# Patient Record
Sex: Female | Born: 1960
Health system: Southern US, Community
[De-identification: ages and names within clinical notes are randomized; demographics above are authoritative.]

## PROBLEM LIST (undated history)

## (undated) DIAGNOSIS — R519 Headache, unspecified: Secondary | ICD-10-CM

## (undated) DIAGNOSIS — M7730 Calcaneal spur, unspecified foot: Secondary | ICD-10-CM

## (undated) DIAGNOSIS — M25519 Pain in unspecified shoulder: Secondary | ICD-10-CM

## (undated) DIAGNOSIS — K5730 Diverticulosis of large intestine without perforation or abscess without bleeding: Secondary | ICD-10-CM

## (undated) DIAGNOSIS — M8580 Other specified disorders of bone density and structure, unspecified site: Secondary | ICD-10-CM

## (undated) DIAGNOSIS — R5381 Other malaise: Secondary | ICD-10-CM

## (undated) DIAGNOSIS — E669 Obesity, unspecified: Secondary | ICD-10-CM

## (undated) DIAGNOSIS — M797 Fibromyalgia: Secondary | ICD-10-CM

## (undated) DIAGNOSIS — F32A Depression, unspecified: Secondary | ICD-10-CM

## (undated) DIAGNOSIS — M1710 Unilateral primary osteoarthritis, unspecified knee: Secondary | ICD-10-CM

## (undated) DIAGNOSIS — E119 Type 2 diabetes mellitus without complications: Secondary | ICD-10-CM

## (undated) DIAGNOSIS — F329 Major depressive disorder, single episode, unspecified: Secondary | ICD-10-CM

## (undated) DIAGNOSIS — T192XXA Foreign body in vulva and vagina, initial encounter: Secondary | ICD-10-CM

## (undated) DIAGNOSIS — R51 Headache: Secondary | ICD-10-CM

## (undated) DIAGNOSIS — B373 Candidiasis of vulva and vagina: Secondary | ICD-10-CM

## (undated) DIAGNOSIS — K219 Gastro-esophageal reflux disease without esophagitis: Secondary | ICD-10-CM

## (undated) DIAGNOSIS — H409 Unspecified glaucoma: Secondary | ICD-10-CM

## (undated) DIAGNOSIS — R5383 Other fatigue: Secondary | ICD-10-CM

## (undated) DIAGNOSIS — M1990 Unspecified osteoarthritis, unspecified site: Secondary | ICD-10-CM

## (undated) DIAGNOSIS — N760 Acute vaginitis: Secondary | ICD-10-CM

## (undated) DIAGNOSIS — I10 Essential (primary) hypertension: Secondary | ICD-10-CM

## (undated) DIAGNOSIS — B9689 Other specified bacterial agents as the cause of diseases classified elsewhere: Secondary | ICD-10-CM

## (undated) DIAGNOSIS — K5790 Diverticulosis of intestine, part unspecified, without perforation or abscess without bleeding: Secondary | ICD-10-CM

## (undated) HISTORY — DX: Unilateral primary osteoarthritis, unspecified knee: M17.10

## (undated) HISTORY — DX: Other malaise: R53.81

## (undated) HISTORY — DX: Acute vaginitis: N76.0

## (undated) HISTORY — DX: Diverticulosis of large intestine without perforation or abscess without bleeding: K57.30

## (undated) HISTORY — DX: Pain in unspecified shoulder: M25.519

## (undated) HISTORY — DX: Obesity, unspecified: E66.9

## (undated) HISTORY — DX: Other specified disorders of bone density and structure, unspecified site: M85.80

## (undated) HISTORY — PX: EYE SURGERY: SHX253

## (undated) HISTORY — DX: Candidiasis of vulva and vagina: B37.3

## (undated) HISTORY — DX: Diverticulosis of intestine, part unspecified, without perforation or abscess without bleeding: K57.90

## (undated) HISTORY — DX: Other specified bacterial agents as the cause of diseases classified elsewhere: B96.89

## (undated) HISTORY — PX: ABDOMINAL HYSTERECTOMY: SHX81

## (undated) HISTORY — DX: Foreign body in vulva and vagina, initial encounter: T19.2XXA

## (undated) HISTORY — DX: Calcaneal spur, unspecified foot: M77.30

## (undated) HISTORY — DX: Unspecified glaucoma: H40.9

## (undated) HISTORY — DX: Other fatigue: R53.83

## (undated) HISTORY — PX: ROTATOR CUFF REPAIR: SHX139

---

## 1988-08-29 HISTORY — PX: ABDOMINAL HYSTERECTOMY: SUR658

## 1998-10-06 ENCOUNTER — Encounter: Admission: RE | Admit: 1998-10-06 | Discharge: 1998-10-06 | Payer: Self-pay | Admitting: Family Medicine

## 1999-05-17 ENCOUNTER — Encounter: Admission: RE | Admit: 1999-05-17 | Discharge: 1999-05-17 | Payer: Self-pay | Admitting: Family Medicine

## 1999-06-02 ENCOUNTER — Encounter: Admission: RE | Admit: 1999-06-02 | Discharge: 1999-06-02 | Payer: Self-pay | Admitting: Family Medicine

## 1999-06-18 ENCOUNTER — Inpatient Hospital Stay (HOSPITAL_COMMUNITY): Admission: RE | Admit: 1999-06-18 | Discharge: 1999-06-19 | Payer: Self-pay | Admitting: Obstetrics and Gynecology

## 1999-06-18 ENCOUNTER — Encounter (INDEPENDENT_AMBULATORY_CARE_PROVIDER_SITE_OTHER): Payer: Self-pay

## 1999-06-24 ENCOUNTER — Encounter: Payer: Self-pay | Admitting: Ophthalmology

## 1999-06-28 ENCOUNTER — Ambulatory Visit (HOSPITAL_COMMUNITY): Admission: RE | Admit: 1999-06-28 | Discharge: 1999-06-28 | Payer: Self-pay | Admitting: Ophthalmology

## 1999-08-30 HISTORY — PX: CATARACT EXTRACTION: SUR2

## 1999-08-30 HISTORY — PX: BLADDER SURGERY: SHX569

## 1999-09-28 ENCOUNTER — Encounter: Admission: RE | Admit: 1999-09-28 | Discharge: 1999-09-28 | Payer: Self-pay | Admitting: Sports Medicine

## 1999-10-19 ENCOUNTER — Encounter: Admission: RE | Admit: 1999-10-19 | Discharge: 1999-10-19 | Payer: Self-pay | Admitting: Sports Medicine

## 1999-12-17 ENCOUNTER — Encounter: Admission: RE | Admit: 1999-12-17 | Discharge: 1999-12-17 | Payer: Self-pay | Admitting: Family Medicine

## 1999-12-27 ENCOUNTER — Encounter: Admission: RE | Admit: 1999-12-27 | Discharge: 2000-03-26 | Payer: Self-pay

## 2000-01-25 ENCOUNTER — Other Ambulatory Visit: Admission: RE | Admit: 2000-01-25 | Discharge: 2000-01-25 | Payer: Self-pay | Admitting: Obstetrics and Gynecology

## 2000-10-06 ENCOUNTER — Encounter: Admission: RE | Admit: 2000-10-06 | Discharge: 2000-10-06 | Payer: Self-pay | Admitting: Family Medicine

## 2000-10-20 ENCOUNTER — Encounter: Admission: RE | Admit: 2000-10-20 | Discharge: 2000-10-20 | Payer: Self-pay | Admitting: Family Medicine

## 2000-11-17 ENCOUNTER — Encounter: Admission: RE | Admit: 2000-11-17 | Discharge: 2000-11-17 | Payer: Self-pay | Admitting: Sports Medicine

## 2000-11-17 ENCOUNTER — Encounter: Payer: Self-pay | Admitting: Sports Medicine

## 2001-03-13 ENCOUNTER — Ambulatory Visit (HOSPITAL_COMMUNITY): Admission: RE | Admit: 2001-03-13 | Discharge: 2001-03-13 | Payer: Self-pay | Admitting: Neurology

## 2001-03-13 ENCOUNTER — Encounter: Payer: Self-pay | Admitting: Neurology

## 2001-05-01 ENCOUNTER — Other Ambulatory Visit: Admission: RE | Admit: 2001-05-01 | Discharge: 2001-05-01 | Payer: Self-pay | Admitting: Obstetrics and Gynecology

## 2001-05-11 ENCOUNTER — Encounter: Admission: RE | Admit: 2001-05-11 | Discharge: 2001-05-11 | Payer: Self-pay | Admitting: Family Medicine

## 2001-05-21 ENCOUNTER — Ambulatory Visit (HOSPITAL_COMMUNITY): Admission: RE | Admit: 2001-05-21 | Discharge: 2001-05-21 | Payer: Self-pay | Admitting: Pulmonary Disease

## 2001-05-21 ENCOUNTER — Encounter: Payer: Self-pay | Admitting: Pulmonary Disease

## 2001-05-24 ENCOUNTER — Encounter: Admission: RE | Admit: 2001-05-24 | Discharge: 2001-05-24 | Payer: Self-pay | Admitting: Family Medicine

## 2001-06-13 ENCOUNTER — Encounter: Admission: RE | Admit: 2001-06-13 | Discharge: 2001-06-13 | Payer: Self-pay | Admitting: Family Medicine

## 2001-07-06 ENCOUNTER — Encounter: Admission: RE | Admit: 2001-07-06 | Discharge: 2001-07-06 | Payer: Self-pay | Admitting: Family Medicine

## 2001-10-05 ENCOUNTER — Encounter: Admission: RE | Admit: 2001-10-05 | Discharge: 2001-10-05 | Payer: Self-pay | Admitting: Family Medicine

## 2001-11-06 ENCOUNTER — Encounter: Admission: RE | Admit: 2001-11-06 | Discharge: 2001-11-06 | Payer: Self-pay | Admitting: Sports Medicine

## 2001-12-20 ENCOUNTER — Encounter: Admission: RE | Admit: 2001-12-20 | Discharge: 2001-12-20 | Payer: Self-pay | Admitting: Family Medicine

## 2002-01-09 ENCOUNTER — Encounter: Admission: RE | Admit: 2002-01-09 | Discharge: 2002-01-09 | Payer: Self-pay | Admitting: Family Medicine

## 2002-02-15 ENCOUNTER — Encounter: Admission: RE | Admit: 2002-02-15 | Discharge: 2002-02-15 | Payer: Self-pay | Admitting: Family Medicine

## 2002-02-25 ENCOUNTER — Encounter: Admission: RE | Admit: 2002-02-25 | Discharge: 2002-04-26 | Payer: Self-pay | Admitting: Sports Medicine

## 2002-03-14 ENCOUNTER — Encounter: Admission: RE | Admit: 2002-03-14 | Discharge: 2002-03-14 | Payer: Self-pay | Admitting: Family Medicine

## 2002-04-07 ENCOUNTER — Emergency Department (HOSPITAL_COMMUNITY): Admission: EM | Admit: 2002-04-07 | Discharge: 2002-04-07 | Payer: Self-pay

## 2002-04-18 ENCOUNTER — Encounter: Admission: RE | Admit: 2002-04-18 | Discharge: 2002-04-18 | Payer: Self-pay | Admitting: Family Medicine

## 2002-04-29 ENCOUNTER — Encounter (INDEPENDENT_AMBULATORY_CARE_PROVIDER_SITE_OTHER): Payer: Self-pay | Admitting: *Deleted

## 2002-04-29 LAB — CONVERTED CEMR LAB

## 2002-05-06 ENCOUNTER — Other Ambulatory Visit: Admission: RE | Admit: 2002-05-06 | Discharge: 2002-05-06 | Payer: Self-pay | Admitting: Obstetrics and Gynecology

## 2002-06-12 ENCOUNTER — Encounter: Admission: RE | Admit: 2002-06-12 | Discharge: 2002-06-12 | Payer: Self-pay | Admitting: Family Medicine

## 2002-06-12 ENCOUNTER — Encounter: Payer: Self-pay | Admitting: Family Medicine

## 2002-06-20 ENCOUNTER — Encounter: Admission: RE | Admit: 2002-06-20 | Discharge: 2002-06-20 | Payer: Self-pay | Admitting: Family Medicine

## 2002-09-18 ENCOUNTER — Encounter: Admission: RE | Admit: 2002-09-18 | Discharge: 2002-09-18 | Payer: Self-pay | Admitting: Family Medicine

## 2002-11-11 ENCOUNTER — Encounter: Admission: RE | Admit: 2002-11-11 | Discharge: 2002-11-11 | Payer: Self-pay | Admitting: Family Medicine

## 2003-01-03 ENCOUNTER — Encounter: Admission: RE | Admit: 2003-01-03 | Discharge: 2003-01-03 | Payer: Self-pay | Admitting: Family Medicine

## 2003-02-18 ENCOUNTER — Encounter: Admission: RE | Admit: 2003-02-18 | Discharge: 2003-02-18 | Payer: Self-pay | Admitting: Family Medicine

## 2003-02-20 ENCOUNTER — Encounter: Payer: Self-pay | Admitting: Sports Medicine

## 2003-02-20 ENCOUNTER — Encounter: Admission: RE | Admit: 2003-02-20 | Discharge: 2003-02-20 | Payer: Self-pay | Admitting: Sports Medicine

## 2003-06-16 ENCOUNTER — Encounter: Admission: RE | Admit: 2003-06-16 | Discharge: 2003-06-16 | Payer: Self-pay | Admitting: Sports Medicine

## 2003-06-16 ENCOUNTER — Encounter: Payer: Self-pay | Admitting: Sports Medicine

## 2003-06-16 ENCOUNTER — Encounter: Admission: RE | Admit: 2003-06-16 | Discharge: 2003-06-16 | Payer: Self-pay | Admitting: Family Medicine

## 2003-06-24 ENCOUNTER — Encounter: Admission: RE | Admit: 2003-06-24 | Discharge: 2003-06-24 | Payer: Self-pay | Admitting: Family Medicine

## 2003-07-18 ENCOUNTER — Encounter: Admission: RE | Admit: 2003-07-18 | Discharge: 2003-07-18 | Payer: Self-pay | Admitting: Sports Medicine

## 2003-09-17 ENCOUNTER — Encounter: Admission: RE | Admit: 2003-09-17 | Discharge: 2003-09-17 | Payer: Self-pay | Admitting: Family Medicine

## 2003-09-29 ENCOUNTER — Encounter: Admission: RE | Admit: 2003-09-29 | Discharge: 2003-09-29 | Payer: Self-pay | Admitting: Family Medicine

## 2003-10-07 ENCOUNTER — Encounter: Admission: RE | Admit: 2003-10-07 | Discharge: 2003-10-07 | Payer: Self-pay | Admitting: Family Medicine

## 2003-10-28 ENCOUNTER — Encounter: Admission: RE | Admit: 2003-10-28 | Discharge: 2003-10-28 | Payer: Self-pay | Admitting: Sports Medicine

## 2003-11-05 ENCOUNTER — Ambulatory Visit (HOSPITAL_COMMUNITY): Admission: RE | Admit: 2003-11-05 | Discharge: 2003-11-05 | Payer: Self-pay | Admitting: Sports Medicine

## 2003-11-05 ENCOUNTER — Encounter: Payer: Self-pay | Admitting: Cardiovascular Disease

## 2003-11-07 ENCOUNTER — Encounter: Admission: RE | Admit: 2003-11-07 | Discharge: 2003-11-07 | Payer: Self-pay | Admitting: Family Medicine

## 2003-11-27 ENCOUNTER — Encounter: Admission: RE | Admit: 2003-11-27 | Discharge: 2003-11-27 | Payer: Self-pay | Admitting: Sports Medicine

## 2004-07-12 ENCOUNTER — Ambulatory Visit: Payer: Self-pay | Admitting: Family Medicine

## 2004-08-02 ENCOUNTER — Ambulatory Visit: Payer: Self-pay | Admitting: Sports Medicine

## 2004-09-17 ENCOUNTER — Ambulatory Visit: Payer: Self-pay | Admitting: Family Medicine

## 2004-11-23 ENCOUNTER — Encounter: Admission: RE | Admit: 2004-11-23 | Discharge: 2004-11-23 | Payer: Self-pay | Admitting: Nurse Practitioner

## 2004-11-25 ENCOUNTER — Encounter: Admission: RE | Admit: 2004-11-25 | Discharge: 2004-11-25 | Payer: Self-pay | Admitting: Sports Medicine

## 2004-11-26 ENCOUNTER — Ambulatory Visit: Payer: Self-pay | Admitting: Family Medicine

## 2005-01-18 ENCOUNTER — Encounter: Admission: RE | Admit: 2005-01-18 | Discharge: 2005-04-18 | Payer: Self-pay | Admitting: Occupational Medicine

## 2005-02-17 ENCOUNTER — Other Ambulatory Visit: Admission: RE | Admit: 2005-02-17 | Discharge: 2005-02-17 | Payer: Self-pay | Admitting: Obstetrics and Gynecology

## 2005-02-24 ENCOUNTER — Ambulatory Visit: Payer: Self-pay | Admitting: Family Medicine

## 2005-06-29 ENCOUNTER — Ambulatory Visit: Payer: Self-pay | Admitting: Family Medicine

## 2005-08-02 ENCOUNTER — Ambulatory Visit: Payer: Self-pay | Admitting: Sports Medicine

## 2005-08-02 ENCOUNTER — Encounter: Admission: RE | Admit: 2005-08-02 | Discharge: 2005-08-02 | Payer: Self-pay | Admitting: Sports Medicine

## 2005-08-05 ENCOUNTER — Ambulatory Visit: Payer: Self-pay | Admitting: Gastroenterology

## 2005-08-11 ENCOUNTER — Ambulatory Visit: Payer: Self-pay | Admitting: Family Medicine

## 2005-08-16 ENCOUNTER — Ambulatory Visit: Payer: Self-pay | Admitting: Gastroenterology

## 2005-09-13 ENCOUNTER — Ambulatory Visit: Payer: Self-pay | Admitting: Gastroenterology

## 2005-09-27 ENCOUNTER — Ambulatory Visit: Payer: Self-pay | Admitting: Family Medicine

## 2005-10-03 ENCOUNTER — Ambulatory Visit: Payer: Self-pay | Admitting: Internal Medicine

## 2005-11-01 ENCOUNTER — Ambulatory Visit: Payer: Self-pay | Admitting: Gastroenterology

## 2005-11-25 ENCOUNTER — Encounter: Admission: RE | Admit: 2005-11-25 | Discharge: 2005-11-25 | Payer: Self-pay | Admitting: Sports Medicine

## 2005-11-25 ENCOUNTER — Ambulatory Visit: Payer: Self-pay | Admitting: Sports Medicine

## 2005-12-22 ENCOUNTER — Ambulatory Visit: Payer: Self-pay | Admitting: Family Medicine

## 2005-12-27 ENCOUNTER — Ambulatory Visit: Payer: Self-pay | Admitting: Sports Medicine

## 2006-01-12 ENCOUNTER — Encounter: Admission: RE | Admit: 2006-01-12 | Discharge: 2006-01-12 | Payer: Self-pay | Admitting: Sports Medicine

## 2006-01-24 ENCOUNTER — Ambulatory Visit: Payer: Self-pay | Admitting: Internal Medicine

## 2006-07-25 ENCOUNTER — Other Ambulatory Visit: Admission: RE | Admit: 2006-07-25 | Discharge: 2006-07-25 | Payer: Self-pay | Admitting: Obstetrics and Gynecology

## 2006-09-29 ENCOUNTER — Ambulatory Visit: Payer: Self-pay | Admitting: Family Medicine

## 2006-10-26 ENCOUNTER — Ambulatory Visit: Payer: Self-pay | Admitting: Family Medicine

## 2006-10-26 DIAGNOSIS — I872 Venous insufficiency (chronic) (peripheral): Secondary | ICD-10-CM | POA: Insufficient documentation

## 2006-10-26 DIAGNOSIS — I10 Essential (primary) hypertension: Secondary | ICD-10-CM | POA: Insufficient documentation

## 2006-10-26 DIAGNOSIS — E669 Obesity, unspecified: Secondary | ICD-10-CM

## 2006-10-26 DIAGNOSIS — J309 Allergic rhinitis, unspecified: Secondary | ICD-10-CM | POA: Insufficient documentation

## 2006-10-26 DIAGNOSIS — M79609 Pain in unspecified limb: Secondary | ICD-10-CM | POA: Insufficient documentation

## 2006-10-26 DIAGNOSIS — K219 Gastro-esophageal reflux disease without esophagitis: Secondary | ICD-10-CM | POA: Insufficient documentation

## 2006-10-26 DIAGNOSIS — K5730 Diverticulosis of large intestine without perforation or abscess without bleeding: Secondary | ICD-10-CM

## 2006-10-26 HISTORY — DX: Diverticulosis of large intestine without perforation or abscess without bleeding: K57.30

## 2006-10-26 HISTORY — DX: Obesity, unspecified: E66.9

## 2006-10-27 ENCOUNTER — Encounter (INDEPENDENT_AMBULATORY_CARE_PROVIDER_SITE_OTHER): Payer: Self-pay | Admitting: *Deleted

## 2006-12-12 ENCOUNTER — Emergency Department (HOSPITAL_COMMUNITY): Admission: EM | Admit: 2006-12-12 | Discharge: 2006-12-12 | Payer: Self-pay | Admitting: Emergency Medicine

## 2007-02-01 ENCOUNTER — Encounter: Admission: RE | Admit: 2007-02-01 | Discharge: 2007-02-01 | Payer: Self-pay | Admitting: Sports Medicine

## 2007-02-01 ENCOUNTER — Encounter (INDEPENDENT_AMBULATORY_CARE_PROVIDER_SITE_OTHER): Payer: Self-pay | Admitting: Family Medicine

## 2007-07-20 ENCOUNTER — Encounter: Admission: RE | Admit: 2007-07-20 | Discharge: 2007-07-20 | Payer: Self-pay | Admitting: Family Medicine

## 2007-07-20 ENCOUNTER — Encounter (INDEPENDENT_AMBULATORY_CARE_PROVIDER_SITE_OTHER): Payer: Self-pay | Admitting: Family Medicine

## 2007-07-20 ENCOUNTER — Encounter: Payer: Self-pay | Admitting: Family Medicine

## 2007-07-20 ENCOUNTER — Ambulatory Visit: Payer: Self-pay | Admitting: Family Medicine

## 2007-07-20 LAB — CONVERTED CEMR LAB
Bilirubin Urine: NEGATIVE
Ketones, urine, test strip: NEGATIVE

## 2007-07-23 ENCOUNTER — Telehealth: Payer: Self-pay | Admitting: *Deleted

## 2007-08-14 ENCOUNTER — Encounter: Payer: Self-pay | Admitting: Family Medicine

## 2007-08-14 ENCOUNTER — Ambulatory Visit: Payer: Self-pay | Admitting: Family Medicine

## 2007-08-14 DIAGNOSIS — E1165 Type 2 diabetes mellitus with hyperglycemia: Secondary | ICD-10-CM

## 2007-08-14 DIAGNOSIS — E118 Type 2 diabetes mellitus with unspecified complications: Secondary | ICD-10-CM

## 2007-08-14 LAB — CONVERTED CEMR LAB
Bilirubin Urine: NEGATIVE
Blood Glucose, Fingerstick: 265
Glucose, Urine, Semiquant: NEGATIVE
Hgb A1c MFr Bld: 7.8 %
Ketones, urine, test strip: NEGATIVE
Nitrite: NEGATIVE
WBC Urine, dipstick: NEGATIVE

## 2007-09-10 ENCOUNTER — Ambulatory Visit: Payer: Self-pay | Admitting: Family Medicine

## 2007-09-10 ENCOUNTER — Encounter: Payer: Self-pay | Admitting: Family Medicine

## 2007-09-10 DIAGNOSIS — M7730 Calcaneal spur, unspecified foot: Secondary | ICD-10-CM

## 2007-09-10 HISTORY — DX: Calcaneal spur, unspecified foot: M77.30

## 2007-09-10 LAB — CONVERTED CEMR LAB
CO2: 25 meq/L (ref 19–32)
Calcium: 9.8 mg/dL (ref 8.4–10.5)
Cholesterol: 164 mg/dL (ref 0–200)
Creatinine, Ser: 0.89 mg/dL (ref 0.40–1.20)
HDL: 46 mg/dL (ref >39)
LDL Cholesterol: 90 mg/dL (ref 0–99)
Potassium: 3.9 meq/L (ref 3.5–5.3)
Total CHOL/HDL Ratio: 3.6
VLDL: 28 mg/dL (ref 0–40)

## 2007-09-28 ENCOUNTER — Telehealth: Payer: Self-pay | Admitting: *Deleted

## 2007-10-04 ENCOUNTER — Ambulatory Visit: Payer: Self-pay | Admitting: Family Medicine

## 2007-10-22 ENCOUNTER — Telehealth: Payer: Self-pay | Admitting: Family Medicine

## 2007-11-02 ENCOUNTER — Telehealth: Payer: Self-pay | Admitting: Family Medicine

## 2007-11-08 ENCOUNTER — Ambulatory Visit: Payer: Self-pay | Admitting: Sports Medicine

## 2007-11-08 ENCOUNTER — Encounter: Payer: Self-pay | Admitting: Family Medicine

## 2007-11-12 ENCOUNTER — Telehealth: Payer: Self-pay | Admitting: Family Medicine

## 2007-12-20 ENCOUNTER — Telehealth (INDEPENDENT_AMBULATORY_CARE_PROVIDER_SITE_OTHER): Payer: Self-pay | Admitting: Family Medicine

## 2008-05-15 ENCOUNTER — Telehealth: Payer: Self-pay | Admitting: *Deleted

## 2008-05-16 ENCOUNTER — Ambulatory Visit: Payer: Self-pay | Admitting: Family Medicine

## 2008-07-10 ENCOUNTER — Encounter: Admission: RE | Admit: 2008-07-10 | Discharge: 2008-07-10 | Payer: Self-pay | Admitting: Family Medicine

## 2008-07-28 ENCOUNTER — Encounter: Admission: RE | Admit: 2008-07-28 | Discharge: 2008-07-28 | Payer: Self-pay | Admitting: Family Medicine

## 2008-09-01 ENCOUNTER — Ambulatory Visit: Payer: Self-pay | Admitting: Family Medicine

## 2008-09-03 ENCOUNTER — Encounter (INDEPENDENT_AMBULATORY_CARE_PROVIDER_SITE_OTHER): Payer: Self-pay | Admitting: Family Medicine

## 2008-09-18 ENCOUNTER — Ambulatory Visit (HOSPITAL_COMMUNITY): Admission: RE | Admit: 2008-09-18 | Discharge: 2008-09-18 | Payer: Self-pay | Admitting: Family Medicine

## 2008-09-18 ENCOUNTER — Ambulatory Visit: Payer: Self-pay | Admitting: Family Medicine

## 2008-09-19 ENCOUNTER — Encounter (INDEPENDENT_AMBULATORY_CARE_PROVIDER_SITE_OTHER): Payer: Self-pay | Admitting: Family Medicine

## 2008-10-21 ENCOUNTER — Ambulatory Visit: Payer: Self-pay | Admitting: Family Medicine

## 2008-10-22 ENCOUNTER — Telehealth: Payer: Self-pay | Admitting: *Deleted

## 2008-12-03 ENCOUNTER — Encounter: Payer: Self-pay | Admitting: Family Medicine

## 2008-12-03 ENCOUNTER — Ambulatory Visit: Payer: Self-pay | Admitting: Family Medicine

## 2008-12-03 LAB — CONVERTED CEMR LAB
ALT: 15 units/L (ref 0–35)
AST: 16 units/L (ref 0–37)
Albumin: 4.1 g/dL (ref 3.5–5.2)
CO2: 24 meq/L (ref 19–32)
Creatinine, Ser: 0.71 mg/dL (ref 0.40–1.20)
LDL Cholesterol: 84 mg/dL (ref 0–99)
Total Protein: 6.6 g/dL (ref 6.0–8.3)
Triglycerides: 80 mg/dL (ref <150)

## 2008-12-08 ENCOUNTER — Ambulatory Visit: Payer: Self-pay | Admitting: Family Medicine

## 2008-12-08 LAB — CONVERTED CEMR LAB
Cholesterol, target level: 200 mg/dL
LDL Goal: 100 mg/dL
Pap Smear: NORMAL

## 2009-01-13 ENCOUNTER — Telehealth: Payer: Self-pay | Admitting: *Deleted

## 2009-01-14 ENCOUNTER — Ambulatory Visit: Payer: Self-pay | Admitting: Family Medicine

## 2009-01-22 ENCOUNTER — Emergency Department (HOSPITAL_COMMUNITY): Admission: EM | Admit: 2009-01-22 | Discharge: 2009-01-22 | Payer: Self-pay | Admitting: Family Medicine

## 2009-01-23 ENCOUNTER — Ambulatory Visit: Payer: Self-pay | Admitting: Family Medicine

## 2009-03-16 ENCOUNTER — Telehealth: Payer: Self-pay | Admitting: Family Medicine

## 2009-03-23 ENCOUNTER — Ambulatory Visit: Payer: Self-pay | Admitting: Family Medicine

## 2009-03-23 ENCOUNTER — Telehealth: Payer: Self-pay | Admitting: *Deleted

## 2009-03-23 LAB — CONVERTED CEMR LAB: Hgb A1c MFr Bld: 7.6 %

## 2009-03-28 ENCOUNTER — Emergency Department (HOSPITAL_COMMUNITY): Admission: EM | Admit: 2009-03-28 | Discharge: 2009-03-28 | Payer: Self-pay | Admitting: Emergency Medicine

## 2009-04-06 ENCOUNTER — Telehealth: Payer: Self-pay | Admitting: Family Medicine

## 2009-04-08 ENCOUNTER — Telehealth: Payer: Self-pay | Admitting: Family Medicine

## 2009-07-16 ENCOUNTER — Encounter: Admission: RE | Admit: 2009-07-16 | Discharge: 2009-07-16 | Payer: Self-pay | Admitting: Family Medicine

## 2009-07-28 ENCOUNTER — Ambulatory Visit: Payer: Self-pay | Admitting: Family Medicine

## 2009-07-28 LAB — CONVERTED CEMR LAB: Hgb A1c MFr Bld: 7.1 %

## 2009-07-30 ENCOUNTER — Telehealth: Payer: Self-pay | Admitting: *Deleted

## 2009-09-04 ENCOUNTER — Ambulatory Visit (HOSPITAL_COMMUNITY): Admission: RE | Admit: 2009-09-04 | Discharge: 2009-09-04 | Payer: Self-pay | Admitting: General Surgery

## 2009-09-11 ENCOUNTER — Encounter
Admission: RE | Admit: 2009-09-11 | Discharge: 2009-11-03 | Payer: Self-pay | Admitting: Physical Medicine & Rehabilitation

## 2009-09-14 ENCOUNTER — Ambulatory Visit (HOSPITAL_COMMUNITY)
Admission: RE | Admit: 2009-09-14 | Discharge: 2009-09-14 | Payer: Self-pay | Admitting: Physical Medicine & Rehabilitation

## 2009-09-14 ENCOUNTER — Ambulatory Visit: Payer: Self-pay | Admitting: Physical Medicine & Rehabilitation

## 2009-09-22 ENCOUNTER — Encounter
Admission: RE | Admit: 2009-09-22 | Discharge: 2009-12-21 | Payer: Self-pay | Admitting: Physical Medicine & Rehabilitation

## 2009-10-13 ENCOUNTER — Ambulatory Visit: Payer: Self-pay | Admitting: Physical Medicine & Rehabilitation

## 2009-10-15 ENCOUNTER — Ambulatory Visit (HOSPITAL_COMMUNITY): Admission: RE | Admit: 2009-10-15 | Discharge: 2009-10-15 | Payer: Self-pay | Admitting: Family Medicine

## 2009-11-03 ENCOUNTER — Ambulatory Visit: Payer: Self-pay | Admitting: Physical Medicine & Rehabilitation

## 2010-01-27 ENCOUNTER — Encounter: Payer: Self-pay | Admitting: Family Medicine

## 2010-01-27 ENCOUNTER — Telehealth: Payer: Self-pay | Admitting: Family Medicine

## 2010-01-27 ENCOUNTER — Ambulatory Visit: Payer: Self-pay | Admitting: Family Medicine

## 2010-01-27 LAB — CONVERTED CEMR LAB
ALT: 68 units/L — ABNORMAL HIGH (ref 0–35)
AST: 54 units/L — ABNORMAL HIGH (ref 0–37)
Albumin: 3.9 g/dL (ref 3.5–5.2)
Calcium: 9.5 mg/dL (ref 8.4–10.5)
Hgb A1c MFr Bld: 9.3 %
Total Protein: 7 g/dL (ref 6.0–8.3)

## 2010-02-02 ENCOUNTER — Telehealth: Payer: Self-pay | Admitting: Family Medicine

## 2010-04-01 ENCOUNTER — Emergency Department (HOSPITAL_COMMUNITY): Admission: EM | Admit: 2010-04-01 | Discharge: 2010-04-01 | Payer: Self-pay | Admitting: Emergency Medicine

## 2010-04-07 ENCOUNTER — Encounter: Payer: Self-pay | Admitting: Family Medicine

## 2010-04-07 ENCOUNTER — Ambulatory Visit: Payer: Self-pay | Admitting: Family Medicine

## 2010-04-07 DIAGNOSIS — M25569 Pain in unspecified knee: Secondary | ICD-10-CM | POA: Insufficient documentation

## 2010-04-13 ENCOUNTER — Ambulatory Visit: Payer: Self-pay | Admitting: Family Medicine

## 2010-04-13 ENCOUNTER — Encounter (INDEPENDENT_AMBULATORY_CARE_PROVIDER_SITE_OTHER): Payer: Self-pay | Admitting: *Deleted

## 2010-04-13 ENCOUNTER — Ambulatory Visit (HOSPITAL_COMMUNITY): Admission: RE | Admit: 2010-04-13 | Discharge: 2010-04-13 | Payer: Self-pay | Admitting: Family Medicine

## 2010-04-19 LAB — CONVERTED CEMR LAB
Crystals, Fluid: NONE SEEN
Glucose, Synovial Fluid: 155 mg/dL
Monocyte/Macrophage: 0 % — ABNORMAL LOW (ref 50–90)
Neutrophil, Synovial: 90 % — ABNORMAL HIGH (ref 0–25)
WBC, Synovial: 5965 — ABNORMAL HIGH (ref 0–200)

## 2010-04-21 ENCOUNTER — Ambulatory Visit: Payer: Self-pay | Admitting: Family Medicine

## 2010-04-21 ENCOUNTER — Telehealth: Payer: Self-pay | Admitting: Family Medicine

## 2010-04-21 DIAGNOSIS — M25579 Pain in unspecified ankle and joints of unspecified foot: Secondary | ICD-10-CM | POA: Insufficient documentation

## 2010-04-21 DIAGNOSIS — R609 Edema, unspecified: Secondary | ICD-10-CM | POA: Insufficient documentation

## 2010-04-23 ENCOUNTER — Telehealth: Payer: Self-pay | Admitting: Family Medicine

## 2010-04-23 ENCOUNTER — Encounter: Payer: Self-pay | Admitting: Family Medicine

## 2010-04-23 LAB — CONVERTED CEMR LAB
ANA Titer 1: 1:40 {titer} — ABNORMAL HIGH
AST: 14 units/L (ref 0–37)
Alkaline Phosphatase: 110 units/L (ref 39–117)
Anti Nuclear Antibody(ANA): POSITIVE — AB
BUN: 11 mg/dL (ref 6–23)
Calcium: 9.4 mg/dL (ref 8.4–10.5)
Creatinine, Ser: 0.68 mg/dL (ref 0.40–1.20)
Glucose, Bld: 283 mg/dL — ABNORMAL HIGH (ref 70–99)
TSH: 1.414 microintl units/mL (ref 0.350–4.500)

## 2010-04-24 ENCOUNTER — Ambulatory Visit (HOSPITAL_COMMUNITY): Admission: RE | Admit: 2010-04-24 | Discharge: 2010-04-24 | Payer: Self-pay | Admitting: Family Medicine

## 2010-04-27 ENCOUNTER — Ambulatory Visit: Payer: Self-pay | Admitting: Family Medicine

## 2010-04-27 DIAGNOSIS — IMO0002 Reserved for concepts with insufficient information to code with codable children: Secondary | ICD-10-CM | POA: Insufficient documentation

## 2010-04-27 DIAGNOSIS — E039 Hypothyroidism, unspecified: Secondary | ICD-10-CM | POA: Insufficient documentation

## 2010-04-27 DIAGNOSIS — M1710 Unilateral primary osteoarthritis, unspecified knee: Secondary | ICD-10-CM

## 2010-04-27 DIAGNOSIS — F329 Major depressive disorder, single episode, unspecified: Secondary | ICD-10-CM

## 2010-04-27 DIAGNOSIS — M549 Dorsalgia, unspecified: Secondary | ICD-10-CM | POA: Insufficient documentation

## 2010-04-27 DIAGNOSIS — F32A Depression, unspecified: Secondary | ICD-10-CM | POA: Insufficient documentation

## 2010-04-27 HISTORY — DX: Reserved for concepts with insufficient information to code with codable children: IMO0002

## 2010-04-29 ENCOUNTER — Telehealth: Payer: Self-pay | Admitting: Family Medicine

## 2010-05-04 ENCOUNTER — Telehealth (INDEPENDENT_AMBULATORY_CARE_PROVIDER_SITE_OTHER): Payer: Self-pay | Admitting: *Deleted

## 2010-05-10 ENCOUNTER — Telehealth: Payer: Self-pay | Admitting: *Deleted

## 2010-05-13 ENCOUNTER — Encounter: Payer: Self-pay | Admitting: Family Medicine

## 2010-05-19 ENCOUNTER — Ambulatory Visit: Payer: Self-pay | Admitting: Family Medicine

## 2010-05-19 ENCOUNTER — Encounter: Payer: Self-pay | Admitting: Family Medicine

## 2010-05-19 DIAGNOSIS — M25519 Pain in unspecified shoulder: Secondary | ICD-10-CM | POA: Insufficient documentation

## 2010-05-19 HISTORY — DX: Pain in unspecified shoulder: M25.519

## 2010-05-21 ENCOUNTER — Telehealth: Payer: Self-pay | Admitting: Family Medicine

## 2010-05-27 ENCOUNTER — Telehealth: Payer: Self-pay | Admitting: Family Medicine

## 2010-05-27 DIAGNOSIS — R5383 Other fatigue: Secondary | ICD-10-CM

## 2010-05-27 DIAGNOSIS — R5381 Other malaise: Secondary | ICD-10-CM | POA: Insufficient documentation

## 2010-05-27 HISTORY — DX: Other malaise: R53.81

## 2010-06-01 ENCOUNTER — Telehealth: Payer: Self-pay | Admitting: Family Medicine

## 2010-06-03 ENCOUNTER — Encounter: Payer: Self-pay | Admitting: Family Medicine

## 2010-06-03 ENCOUNTER — Telehealth: Payer: Self-pay | Admitting: Family Medicine

## 2010-06-08 ENCOUNTER — Ambulatory Visit: Payer: Self-pay | Admitting: Family Medicine

## 2010-06-08 ENCOUNTER — Encounter: Payer: Self-pay | Admitting: Family Medicine

## 2010-06-08 LAB — CONVERTED CEMR LAB
Anti Nuclear Antibody(ANA): NEGATIVE
Rhuematoid fact SerPl-aCnc: <20 intl units/mL (ref 0–20)

## 2010-06-10 ENCOUNTER — Telehealth: Payer: Self-pay | Admitting: *Deleted

## 2010-06-11 ENCOUNTER — Telehealth: Payer: Self-pay | Admitting: Family Medicine

## 2010-06-17 ENCOUNTER — Telehealth: Payer: Self-pay | Admitting: Family Medicine

## 2010-06-21 ENCOUNTER — Telehealth: Payer: Self-pay | Admitting: Family Medicine

## 2010-06-23 ENCOUNTER — Ambulatory Visit: Payer: Self-pay | Admitting: Family Medicine

## 2010-06-28 ENCOUNTER — Telehealth (INDEPENDENT_AMBULATORY_CARE_PROVIDER_SITE_OTHER): Payer: Self-pay | Admitting: Family Medicine

## 2010-06-29 ENCOUNTER — Encounter: Payer: Self-pay | Admitting: Family Medicine

## 2010-07-06 ENCOUNTER — Encounter: Payer: Self-pay | Admitting: Family Medicine

## 2010-07-06 ENCOUNTER — Ambulatory Visit: Payer: Self-pay | Admitting: Family Medicine

## 2010-07-08 ENCOUNTER — Telehealth: Payer: Self-pay | Admitting: *Deleted

## 2010-07-08 ENCOUNTER — Encounter: Payer: Self-pay | Admitting: Family Medicine

## 2010-07-13 ENCOUNTER — Encounter (INDEPENDENT_AMBULATORY_CARE_PROVIDER_SITE_OTHER): Payer: Self-pay | Admitting: *Deleted

## 2010-07-27 ENCOUNTER — Encounter (INDEPENDENT_AMBULATORY_CARE_PROVIDER_SITE_OTHER): Payer: Self-pay | Admitting: *Deleted

## 2010-08-18 ENCOUNTER — Encounter: Payer: Self-pay | Admitting: Family Medicine

## 2010-08-31 ENCOUNTER — Encounter: Payer: Self-pay | Admitting: Family Medicine

## 2010-09-09 ENCOUNTER — Encounter: Payer: Self-pay | Admitting: Family Medicine

## 2010-09-15 ENCOUNTER — Ambulatory Visit: Admission: RE | Admit: 2010-09-15 | Discharge: 2010-09-15 | Payer: Self-pay | Source: Home / Self Care

## 2010-09-15 ENCOUNTER — Telehealth: Payer: Self-pay | Admitting: *Deleted

## 2010-09-15 ENCOUNTER — Encounter
Admission: RE | Admit: 2010-09-15 | Discharge: 2010-09-15 | Payer: Self-pay | Source: Home / Self Care | Attending: Family Medicine | Admitting: Family Medicine

## 2010-09-19 ENCOUNTER — Encounter: Payer: Self-pay | Admitting: Family Medicine

## 2010-09-22 ENCOUNTER — Encounter: Payer: Self-pay | Admitting: Family Medicine

## 2010-09-22 ENCOUNTER — Telehealth: Payer: Self-pay | Admitting: Family Medicine

## 2010-09-28 ENCOUNTER — Telehealth: Payer: Self-pay | Admitting: *Deleted

## 2010-09-28 NOTE — Progress Notes (Signed)
Summary: will get plain films   Phone Note Outgoing Call   Call placed by: Sarah Swaziland MD,  April 23, 2010 5:19 PM Summary of Call: Spoke with pt.  She is still having significant B feet when walking.  When puts feet up, swelling decreases.  No redness or warmth noted of feet or ankles.  She is going to her part time job tonight, but agrees to [employer].   Initial call taken by: Sarah Swaziland MD,  April 23, 2010 5:20 PM

## 2010-09-28 NOTE — Miscellaneous (Signed)
Summary: re: medical release form/ts  called pt. pt said, that she did signed a MEDICAL RELEASE FORM on 06-29-10, for her insurance company. They have never received information. Pt wants to know, what takes so long. Please call pt back.  Where is the form? Will ask K.Harrelson to find out.Arlyss Repress CMA,  July 13, 2010 5:52 PM'  faxed medical records info to [facility] - 05/29/10 to present De Nurse  July 19, 2010 1:49 PM

## 2010-09-28 NOTE — Assessment & Plan Note (Signed)
Summary: FILL OUT DISABILITY CLAIM/DSL   Vital Signs:  Patient profile:   50 year old female Weight:      203 pounds Temp:     98.9 degrees F oral Pulse rate:   106 / minute Pulse rhythm:   regular BP sitting:   117 / 83  (left arm) Cuff size:   large  Vitals Entered By: Loralee Pacas CMA (July 06, 2010 4:49 PM) CC: follow-up visit   Primary Care Provider:  Jamie Brookes MD  CC:  follow-up visit.  History of Present Illness: Shoulder, knee and ankle pain on the left: Pt is pursuing disability for her pain and has paperwork that she wants filled out. Discussed that it is good for her to continue working and that she is to return to work on Nov 16 and work at least part time (4 hours a day). SHe says that her pain comes when she tries to get up and down and when she walks from her car to the hospital. She has a temporary handicap pass and parks in the doctors parking lot which is the closest to the hospital. Pt was recently tried on Percocet for the pain. SHe could not tolerate it per her report. She says it made her extremely nauseated. She took it for 2 days and then another day she took 1/2 pill and still could not tolerate the symptoms so she quit taking the meds. Still wants to try something for the pain.   Of note, pt recently fell in her yard on a day when she went out without her cane. She says that she laid in the yard for 45 min before she was able to crawl to a tree and pull herself up. This was about 1 week ago.   Habits & Providers  Alcohol-Tobacco-Diet     Tobacco Status: never  Current Medications (verified): 1)  Lasix 20 Mg Tabs (Furosemide) .... One Tab By Mouth Four Times A Week. 2)  Prilosec 20 Mg Cpdr (Omeprazole) .... Take 1 Capsule By Mouth Once A Day 3)  Xyzal 5 Mg Tabs (Levocetirizine Dihydrochloride) .... Take 1 Tablet By Mouth At Bedtime 4)  Nasacort Aq 55 Mcg/act Aers (Triamcinolone Acetonide(Nasal)) .... 2 Sprays in Each Nostril Daily 5)   Benzonatate 200 Mg Caps (Benzonatate) .Marland Kitchen.. 1 By Mouth Three Times A Day Prn 6)  Oxycodone-Acetaminophen 5-325 Mg Tabs (Oxycodone-Acetaminophen) .... Take 1 Pill Every 6 Hours As Needed For Pain  Allergies (verified): 1)  ! Codeine 2)  ! Penicillin V Potassium (Penicillin V Potassium) 3)  ! Darvocet A500 4)  ! Reglan (Metoclopramide Hcl) 5)  ! Sulfa  Social History: Quit Tobacco 10/00; stopped smoking Black & Mild cigars occasionally. dating ex-husband. 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  Has 2 children. Has had left shoulder, knee and ankle pain since Aug 2011 that is debilitating. Smoking Status:  never  Review of Systems          Physical Exam  General:  Well-developed,well-nourished,in no acute distress; alert,appropriate and cooperative throughout examination Msk:  pt walks with a cane, she has a lot of difficulty rising from a chair   Impression & Recommendations:  Problem # 1:  SHOULDER PAIN, LEFT (ICD-719.41) Assessment Deteriorated PT continues to have pain that is not controled by the Oxycodone. We will switch to MS contin for a more long acting med. Pt has an appt with Rheum on Dec 21st. She continues to take Flexeril and Tylenol for the pain  which gives her a little relief. She also uses a heating pad and props her arm up on pillows. Cont these methods, start the MS Contin, start working on the 16th part time.   Her updated medication list for this problem includes:    Ms Contin 15 Mg Xr12h-tab (Morphine sulfate) .Marland Kitchen... Take 1 pill every 12 hours for pain  Orders: [facility]- Est Level  3 ([address])  Medications Added to Medication List This Visit: 1)  Ms Contin 15 Mg Xr12h-tab (Morphine sulfate) .... Take 1 pill every 12 hours for pain  Complete Medication List: 1)  Lasix 20 Mg Tabs (Furosemide) .... One tab by mouth four times a week. 2)  Prilosec 20 Mg Cpdr (Omeprazole) .... Take 1 capsule by mouth once a day 3)  Xyzal 5 Mg Tabs (Levocetirizine dihydrochloride) ....  Take 1 tablet by mouth at bedtime 4)  Nasacort Aq 55 Mcg/act Aers (Triamcinolone acetonide(nasal)) .... 2 sprays in each nostril daily 5)  Benzonatate 200 Mg Caps (Benzonatate) .Marland Kitchen.. 1 by mouth three times a day prn 6)  Ms Contin 15 Mg Xr12h-tab (Morphine sulfate) .... Take 1 pill every 12 hours for pain Prescriptions: MS CONTIN 15 MG XR12H-TAB (MORPHINE SULFATE) take 1 pill every 12 hours for pain  #5 x 0   Entered and Authorized by:   Jamie Brookes MD   Signed by:   Jamie Brookes MD on 07/07/2010   Method used:   Handwritten   RxID:   [id number]    Orders Added: 1)  [facility]- Est Level  3 [95621]

## 2010-09-28 NOTE — Progress Notes (Signed)
Summary: phn msg  Phone Note Call from Patient Call back at [facility] Phone [phone]   Caller: Patient Summary of Call: Pt calling about referral to reumatologist and also about her papers that were to be faxed to her company so she can get paid. Initial call taken by: Clydell Hakim,  June 17, 2010 9:05 AM  Follow-up for Phone Call        Dr. Clotilde Dieter is going to fill out the papers.

## 2010-09-28 NOTE — Letter (Signed)
Summary: Out of Work: canceled: incorrect info  [facility]  [address]   [place], [place] [address]   Phone: [phone]  Fax: [phone]    July 06, 2010   Employee:  ONITA PFLUGER    To Whom It May Concern:   Pt may return to work on Nov 16th. We are continuing to work on her health concerns.   If you need additional information, please feel free to contact our office.         Sincerely,    Jamie Brookes MD

## 2010-09-28 NOTE — Progress Notes (Signed)
  Phone Note Call from Patient   Caller: Patient Call For: [phone] Summary of Call: Ms. Amorin called back to leave number for HR for [employer].   Pt also says she is unable to work because she has a lot of pain and the Ibuprophen she was prescribed isn't helping.  Need something stronger.  FMLA papers must give a date of patient being out. Initial call taken by: Abundio Miu,  May 27, 2010 10:04 AM  Follow-up for Phone Call        If patient wants to bring in the papers this morning (I won't be here this afternoon or tomorrow) then I am happy to write a day on them.  This patient is seen by Dr. Doroteo Bradford (Pain medicine doctor) so I am not allowed to write for any narcotics for her. She told me that Tramadol doesn't work for her and then it and ibuprofen makes her nauseated. Tell her to call Dr. Jodean Lima office to be seen for the worsening pain. We are not allowed to give anything stronger than NSAID's since she has a pain doctor. Thanks. Follow-up by: Jamie Brookes MD,  May 27, 2010 10:16 AM  New Problems: MALAISE AND FATIGUE (ICD-780.79)   Additional Follow-up for Phone Call Additional follow up Details #2::    Pt want to get a referral to a rheumatologist.  She said she couldn't take pain anymore.  She hasn't been to Dr. Trecia Rogers office.  Patient tearful and want to be referred to someone else. Follow-up by: Abundio Miu,  May 28, 2010 10:20 AM  Additional Follow-up for Phone Call Additional follow up Details #3:: Details for Additional Follow-up Action Taken: Pt would need to get some specific labs to determine if she has a rheumatologic disease before I could refer her to a Rheumatologist. She can come by and get the labs done and I will put in the orders now. Please let her know.  Additional Follow-up by: Jamie Brookes MD,  May 30, 2010 5:39 AM  New Problems: MALAISE AND FATIGUE (ICD-780.79)

## 2010-09-28 NOTE — Assessment & Plan Note (Signed)
Summary: swollen,painful feet & legs/[place]   Vital Signs:  Patient profile:   50 year old female Height:      65.5 inches Weight:      212.7 pounds BMI:     34.98 Temp:     98.6 degrees F oral Pulse rate:   94 / minute BP sitting:   118 / 79  (left arm) Cuff size:   regular  Vitals Entered By: Garen Grams LPN (April 21, 2010 11:02 AM) CC: pain/swelling in ankles/feet Is Patient Diabetic? Yes Did you bring your meter with you today? No Pain Assessment Patient in pain? yes     Location: ankles/feet   Primary Care Provider:  Edd Arbour  CC:  pain/swelling in ankles/feet.  History of Present Illness: 50 yo with swelling in legs.  + Knee swelling since August 4th. Has been seen at [facility] with joint aspiration.  Now with feet swelling and painful as well as knee pain Foot pain, swelling started about the 16th.  Worsening.  Took meloxicam but felt throat was sore from it, so she stopped.  Taking Flexeril without relief.  Pain bad when she stands up, puts pressure on them.  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 Increased her furosemide to try to get fluid off.  Made feet hurt and she felt she did not have enough potassium. No injury to foot.    Has been seen in pain clinic.  She didn't like them.  Declines narcotics because they upset her stomach,.  No orthopnea or dyspnea.  No chest pain.  No h/o kidney problems.  Nl urine output.    Not taking any diabetic meds.  Not checking sugars at home.  Reports she had done very well with dietary changes and then her last A1C was elevated, so she was started on meds again.  They did not agree with her, so she stopped.  Habits & Providers  Alcohol-Tobacco-Diet     Tobacco Status: quit  Current Medications (verified): 1)  Lasix 20 Mg Tabs (Furosemide) .... One Tab By Mouth Four Times A Week. 2)  Prilosec 20 Mg Cpdr (Omeprazole) .... Take 1 Capsule By Mouth Once A Day 3)  Xyzal 5 Mg Tabs (Levocetirizine Dihydrochloride) .... Take 1  Tablet By Mouth At Bedtime 4)  Nasacort Aq 55 Mcg/act Aers (Triamcinolone Acetonide(Nasal)) .... 2 Sprays in Each Nostril Daily 5)  Naprosyn 500 Mg Tabs (Naproxen) .... One Tab By Mouth Two Times A Day As Needed Pain 6)  Cyclobenzaprine Hcl 10 Mg  Tabs (Cyclobenzaprine Hcl) .... 1/2 Tab To 1 Tab By Mouth 2 Times Daily As Needed For Pain 7)  Benzonatate 200 Mg Caps (Benzonatate) .Marland Kitchen.. 1 By Mouth Three Times A Day Prn  Allergies: 1)  ! Codeine 2)  ! Penicillin V Potassium (Penicillin V Potassium) 3)  ! Darvocet A500 4)  ! Reglan (Metoclopramide Hcl) 5)  ! Sulfa  Past History:  Past medical, surgical, family and social histories (including risk factors) reviewed, and no changes noted (except as noted below).  Past Medical History: Reviewed history from 02/12/2010 and no changes required. DM HTN allergic rhinitis  Past Surgical History: Reviewed history from 02/12/2010 and no changes required. Anterior repair and pubovesical sling - 05/30/1999 hysterectomy, no h/o abn paps  Rotator Cuff repair/acromioplasty - 08/29/1994  Family History: Reviewed history from 10/04/2007 and no changes required. brother - dm, father - died age 53 ?cause, Mother - died of breast CA age 48, dm  Social History: Reviewed history from  07/28/2009 and no changes required. Quit Tobacco 10/00; stopped smoking Black & Mild cigars occasionally. dating ex-husband. 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  Has 2 children.Smoking Status:  quit  Review of Systems       see HPI  Physical Exam  General:  Obese, Well-developed,well-nourished,in no acute distress; alert,appropriate and cooperative throughout examination Extremities:  Pt with sleeve on L knee.  R ankle with soft tissue swelling over lateral malleolus.  No erythema or warmth.  Mild TTP over midfoot.  No plantar TTP.   R foot with mild swelling lateral malleolus.  No erythema or warmth.  Able to ambulate, but states it is really painful and feels better  if she weight bears on lateral side of feet.  No lesions noted. Psych:  Normal grooming and dress.  Pt guarded and slightly hostile when describing past interactions with health care system and cost of medicines and interventions.   Impression & Recommendations:  Problem # 1:  ANKLE PAIN, BILATERAL (ICD-719.47)  Unclear etiology.  Given hx, Charcot foot is a concern, but pt without any erythema or warmth, and pain is main concern.  Will check labs to look for other causes of LE edema (BNP, BMP) and follow up.  Pt declines X-rays.  Only pain meds she can take are cyclobenzaprine and naprosyn.  May continue with those.  Has appt scheduled next week with primary care provider.  Follow up sooner if needed.  Orders: [facility]- Est Level  3 ([address])  Complete Medication List: 1)  Lasix 20 Mg Tabs (Furosemide) .... One tab by mouth four times a week. 2)  Prilosec 20 Mg Cpdr (Omeprazole) .... Take 1 capsule by mouth once a day 3)  Xyzal 5 Mg Tabs (Levocetirizine dihydrochloride) .... Take 1 tablet by mouth at bedtime 4)  Nasacort Aq 55 Mcg/act Aers (Triamcinolone acetonide(nasal)) .... 2 sprays in each nostril daily 5)  Naprosyn 500 Mg Tabs (Naproxen) .... One tab by mouth two times a day as needed pain 6)  Cyclobenzaprine Hcl 10 Mg Tabs (Cyclobenzaprine hcl) .... 1/2 tab to 1 tab by mouth 2 times daily as needed for pain 7)  Benzonatate 200 Mg Caps (Benzonatate) .Marland Kitchen.. 1 by mouth three times a day prn  Other Orders: Comp Met-[facility] [phone]) TSH-[facility] [phone]) ANA-[facility] [phone]) B Nat Peptide-[facility] [phone])

## 2010-09-28 NOTE — Progress Notes (Signed)
Summary: Ref Req  Phone Note Call from Patient Call back at [facility] Phone [phone]   Caller: Patient Summary of Call: Pt wants to be referred to a rheumatologist.  Can this be done? Initial call taken by: Clydell Hakim,  June 01, 2010 8:50 AM  Follow-up for Phone Call        Foward to PCP Follow-up by: Jimmy Footman, CMA,  June 01, 2010 10:13 AM  Additional Follow-up for Phone Call Additional follow up Details #1::        states she does not want to go back to the pain clinic as they will only give meds. wants a specialist. told her we will call when we have the day & time  also told her the fmla forms were at front for her to pick up Additional Follow-up by: Golden Circle RN,  June 02, 2010 4:57 PM    Additional Follow-up for Phone Call Additional follow up Details #2::    Yes She can see a rheumatologist.  Follow-up by: Edd Arbour,  June 03, 2010 12:20 PM  Additional Follow-up for Phone Call Additional follow up Details #3:: Details for Additional Follow-up Action Taken: plz enter an order for this & send to your team Additional Follow-up by: Golden Circle RN,  June 03, 2010 1:52 PM   Pt needs labs done to determine if she needs to see a Rheumatologist. Orders already put in. She can have the blood drawn at her earliest convienence. Jamie Brookes MD  June 07, 2010 4:10 PM

## 2010-09-28 NOTE — Letter (Signed)
Summary: Disability Statement  Disability Statement   Imported By: De Nurse 07/08/2010 11:47:36  _____________________________________________________________________  External Attachment:    Type:   Image     Comment:   External Document

## 2010-09-28 NOTE — Letter (Signed)
Summary: Out of Work  [facility]  [address]   [place], [place] [address]   Phone: [phone]  Fax: [phone]    May 19, 2010   Employee:  MALEEAH CROSSMAN    To Whom It May Concern:   For Medical reasons, please excuse the above named employee from work for the following dates:  Start:   05-19-10  End:   05-19-10  Pt is under our office's care for an ongoing medical condition that limits her ability to be mobile.  If you need additional information, please feel free to contact our office.         Sincerely,    Jamie Brookes MD

## 2010-09-28 NOTE — Progress Notes (Signed)
  Phone Note Call from Patient   Caller: Patient Call For: [phone] Summary of Call: Pt calling regarding referral to rheumatologist.  Need to be notified as soon as appt made.   Initial call taken by: Abundio Miu,  June 11, 2010 9:27 AM  Follow-up for Phone Call        Addendum: Received fax from insurance co for pt's short term disability.  Form placed in your box to complete.  Dr. Clotilde Dieter had filled out the other paper work to [employer].  Please confer with her for update. Follow-up by: Abundio Miu,  June 11, 2010 9:49 AM  Additional Follow-up for Phone Call Additional follow up Details #1::        Ok I will fill it out

## 2010-09-28 NOTE — Progress Notes (Signed)
   Phone Note Call from Patient   Caller: Patient Summary of Call: Ms. Bridget [name] called regarding her rxs that was suppose to have been called in to the Outpt pharmacy.  She went there this am and they did not have anything.  Please resend orders for the two showing and let her know they have been sent.  [phone] Initial call taken by: Edd Arbour,  April 29, 2010 11:58 AM    Prescriptions: IBUPROFEN 800 MG TABS (IBUPROFEN) take one tab by mouth every 6 hours, always take with food.  #120 x 2   Entered and Authorized by:   Edd Arbour   Signed by:   Edd Arbour on 04/29/2010   Method used:   Faxed to ...       [facility] (mail-order)       [address]       [place], [place]  [address]       Ph: [phone]       Fax: [phone]   RxID:   [id number] TRAMADOL HCL 50 MG TABS (TRAMADOL HCL) take one tab by mouth every 6 hours, always take with food.  #90 x 2   Entered and Authorized by:   Edd Arbour   Signed by:   Edd Arbour on 04/29/2010   Method used:   Faxed to ...       [facility] (mail-order)       [address]       [place], [place]  [address]       Ph: [phone]       Fax: [phone]   RxID:   [id number]

## 2010-09-28 NOTE — Progress Notes (Signed)
Summary: phn msg  Phone Note Call from Patient Call back at Home Phone [phone]   Caller: Patient Summary of Call: Pt was sent home Friday from work due to not being able to stand up on her legs and needs a note from the doctor stating why she is under his care and why she needs to be excused from work.  It hurts to walk and still is unable to take the medication that he had perscribed her. Initial call taken by: Clydell Hakim,  May 10, 2010 8:43 AM  Follow-up for Phone Call        pt has appt with dr. Clotilde Dieter to do fmla paperwork Follow-up by: Loralee Pacas CMA,  May 14, 2010 10:11 AM

## 2010-09-28 NOTE — Progress Notes (Signed)
Summary: Rx  Phone Note Call from Patient Call back at Home Phone [phone]   Reason for Call: Talk to Nurse Summary of Call: pts sts the morphine prescribed worked for her, would like Rx written Initial call taken by: Knox Royalty,  July 08, 2010 10:26 AM  Follow-up for Phone Call        I will write it and have it at the front today. Please let her know she should be able to pick up the disability form as well because I finished it and gave it to Lupita Leash this morning first thing.  Follow-up by: Jamie Brookes MD,  July 08, 2010 12:37 PM  Additional Follow-up for Phone Call Additional follow up Details #1::        Pt left 2 forms to be completed.  Disability form was received, but the medical release form is the one still missing and she need that in order for the insurance company to release payment to her.  She has been contacted by the mortgage company about her payment and has been told she may have to leave her home.   Additional Follow-up by: Abundio Miu,  July 12, 2010 9:11 AM    Additional Follow-up for Phone Call Additional follow up Details #2::    called pt. informed. Follow-up by: Arlyss Repress CMA,,  July 13, 2010 5:47 PM  Prescriptions: MS CONTIN 15 MG XR12H-TAB (MORPHINE SULFATE) take 1 pill every 12 hours for pain  #60 x 0   Entered by:   Arlyss Repress CMA,   Authorized by:   Jamie Brookes MD   Signed by:   Arlyss Repress CMA, on 07/13/2010   Method used:   Handwritten   RxID:   [id number]

## 2010-09-28 NOTE — Assessment & Plan Note (Signed)
Summary: sore throat,df   Vital Signs:  Patient profile:   50 year old female Weight:      232.9 pounds Temp:     98.9 degrees F oral Pulse rate:   91 / minute Pulse rhythm:   regular BP sitting:   136 / 79  (right arm) Cuff size:   large  Vitals Entered By: Loralee Pacas CMA (January 27, 2010 9:45 AM) CC: sore throat x 3 days Is Patient Diabetic? No   Primary Care Provider:  Lequita Asal  MD  CC:  sore throat x 3 days.  History of Present Illness: SORE THROAT  Onset: 3 days Description: around sick children, h/o allergic rhinitis; feel drainage Modifying factors: some improvement with old rx of tessalon perles  Symptoms  Fever: no  URI symptoms: yes Cough: yes Headache: no Rash: no  Swollen glands: no   Recent Strep Exposure: no LUQ pain: no Heartburn/brash: no Allergy Symptoms: yes, rhinorrhea, postnasal drip  Red Flags STD exposure: no Breathing difficulty: no Drooling: no Trismus: no  DM- patient previously took self off of all medications for DM and HTN. reports feeling "strange" lately. denies polyuria, polydipsia, unintended weight loss.   HTN- on lasix for fluid retention. denies chest pain, headaches, blurred vision, or worsening swelling.   Habits & Providers  Alcohol-Tobacco-Diet     Tobacco Status: never  Current Medications (verified): 1)  Lasix 20 Mg Tabs (Furosemide) .... One Tab By Mouth Four Times A Week. 2)  Prilosec 20 Mg Cpdr (Omeprazole) .... Take 1 Capsule By Mouth Once A Day 3)  Xyzal 5 Mg Tabs (Levocetirizine Dihydrochloride) .... Take 1 Tablet By Mouth At Bedtime 4)  Nasacort Aq 55 Mcg/act Aers (Triamcinolone Acetonide(Nasal)) .... 2 Sprays in Each Nostril Daily 5)  Naprosyn 500 Mg Tabs (Naproxen) .... One Tab By Mouth Two Times A Day As Needed Pain 6)  Cyclobenzaprine Hcl 10 Mg  Tabs (Cyclobenzaprine Hcl) .... 1/2 Tab To 1 Tab By Mouth 2 Times Daily As Needed For Pain  Allergies (verified): 1)  ! Codeine 2)  ! Penicillin V  Potassium (Penicillin V Potassium) 3)  ! Darvocet A500 4)  ! Reglan (Metoclopramide Hcl) 5)  ! Sulfa  Social History: Smoking Status:  never  Physical Exam  General:  obese female, NAD. vitals reviewed.  Eyes:  No corneal or conjunctival inflammation noted. EOMI. Perrla.  Ears:  External ear exam shows no significant lesions or deformities.  Otoscopic examination reveals clear canals, tympanic membranes are intact bilaterally without bulging, retraction, inflammation or discharge. Hearing is grossly normal bilaterally. Nose:  External nasal examination shows no deformity or inflammation. Nasal mucosa are pink and moist without lesions or exudates. Mouth:  Oral mucosa and oropharynx without lesions or exudates.  Teeth in good repair. postnasal drip.   Lungs:  Normal respiratory effort, chest expands symmetrically. Lungs are clear to auscultation, no crackles or wheezes. Heart:  Normal rate and regular rhythm. S1 and S2 normal without gallop, murmur, click, rub or other extra sounds. Extremities:  no edema of BLE Skin:  Intact without suspicious lesions or rashes   Impression & Recommendations:  Problem # 1:  SORE THROAT (ICD-462) Assessment New  likely postnasal drip syndrome vs. URI. encouraged continued use of nasal steroid and xyzal, as well as supportive care for possible URI. no contacts with strep, and no fever, so unlikely strep pharyngitis.   Her updated medication list for this problem includes:    Naprosyn 500 Mg Tabs (Naproxen) .Marland KitchenMarland KitchenMarland KitchenMarland Kitchen  One tab by mouth two times a day as needed pain  Orders: [facility]- Est  Level 4 ([address])  Problem # 2:  DIABETES MELLITUS, TYPE II, CONTROLLED (ICD-250.00) Assessment: Deteriorated  tried to call and discuss with patient. will send rx for glipizide to [facility] outpatient pharmacy. check LDL   Orders: A1C-[facility] (60454) [facility]- Est  Level 4 (09811)  Her updated medication list for this problem includes:    Glucotrol 10 Mg Tabs (Glipizide) ..... One  tab by mouth daily  Problem # 3:  HYPERTENSION, BENIGN SYSTEMIC (ICD-401.1) Assessment: Unchanged at goal.   Her updated medication list for this problem includes:    Lasix 20 Mg Tabs (Furosemide) ..... One tab by mouth four times a week.  Orders: Comp Met-[facility] [phone]) Direct LDL-[facility] [phone]) [facility]- Est  Level 4 ([address]295) Prescriptions: CYCLOBENZAPRINE HCL 10 MG  TABS (CYCLOBENZAPRINE HCL) 1/2 tab to 1 tab by mouth 2 times daily as needed for pain  #30 x 5   Entered and Authorized by:   Lequita Asal  MD   Signed by:   Lequita Asal  MD on 01/27/2010   Method used:   Electronically to        [facility]* (retail)       [address]       [address] Shipping/mailing       [place], [place]  [address]       Ph: [phone]       Fax: [phone]   RxID:   [id number] NAPROSYN 500 MG TABS (NAPROXEN) one tab by mouth two times a day as needed pain  #60 x 3   Entered and Authorized by:   Lequita Asal  MD   Signed by:   Lequita Asal  MD on 01/27/2010   Method used:   Electronically to        [facility]* (retail)       [address]       [address] [place] [address]mailing       [place], [place]  [address]       Ph: [phone]       Fax: [phone]   RxID:   [id number] NASACORT AQ 55 MCG/ACT AERS (TRIAMCINOLONE ACETONIDE(NASAL)) 2 sprays in each nostril daily  #1 x 1   Entered and Authorized by:   Lequita Asal  MD   Signed by:   Lequita Asal  MD on 01/27/2010   Method used:   Electronically to        [facility]* (retail)       [address]       [address] Shipping/mailing       [place], [place]  [address]       Ph: [phone]       Fax: [phone]   RxID:   [id number] XYZAL 5 MG TABS (LEVOCETIRIZINE DIHYDROCHLORIDE) Take 1 tablet by mouth at bedtime  #90 x 1   Entered and Authorized by:   Lequita Asal  MD   Signed by:   Lequita Asal  MD on 01/27/2010   Method  used:   Electronically to        [facility]* (retail)       [address]       [address] Shipping/mailing       [place], [place]  [address]       Ph: [phone]  Fax: [phone]   RxID:   [id number] PRILOSEC 20 MG CPDR (OMEPRAZOLE) Take 1 capsule by mouth once a day  #90 x 1   Entered and Authorized by:   Lequita Asal  MD   Signed by:   Lequita Asal  MD on 01/27/2010   Method used:   Electronically to        [facility]* (retail)       [address]       [address] Shipping/mailing       [place], [place]  [address]       Ph: [phone]       Fax: [phone]   RxID:   [id number] LASIX 20 MG TABS (FUROSEMIDE) one tab by mouth four times a week.  #90 x 1   Entered and Authorized by:   Lequita Asal  MD   Signed by:   Lequita Asal  MD on 01/27/2010   Method used:   Electronically to        [facility]* (retail)       [address]       [address] Shipping/mailing       [place], [place]  [address]       Ph: [phone]       Fax: [phone]   RxID:   [id number] GLUCOTROL 10 MG TABS (GLIPIZIDE) one tab by mouth daily  #90 x 1   Entered and Authorized by:   Lequita Asal  MD   Signed by:   Lequita Asal  MD on 01/27/2010   Method used:   Electronically to        [facility]* (retail)       [address]       [address] [place] [address]mailing       [place], [place]  [address]       Ph: [phone]       Fax: [phone]   RxID:   [id number] FLUCONAZOLE 150 MG TABS (FLUCONAZOLE) one tab by mouth x one  #1 x 0   Entered and Authorized by:   Lequita Asal  MD   Signed by:   Lequita Asal  MD on 01/27/2010   Method used:   Electronically to        [facility]* (retail)       [address]       [address] [place] [address]mailing       [place], [place]  [address]       Ph: [phone]       Fax: [phone]   RxID:    [id number]   Laboratory Results   Blood Tests   Date/Time Received: January 27, 2010 10:09 AM  Date/Time Reported: January 27, 2010 10:45 AM   HGBA1C: 9.3%   (Normal Range: Non-Diabetic - 3-6%   Control Diabetic - 6-8%)  Comments: ...............test performed by......Marland KitchenBonnie A. [place], MLS (ASCP)cm       Prevention & Chronic Care Immunizations   Influenza vaccine: refused  (12/08/2008)   Influenza vaccine deferral: Not indicated  (07/28/2009)   Influenza vaccine due: 12/08/2009    Tetanus booster: 12/08/2008: Tdap   Tetanus booster due: 12/09/2018    Pneumococcal vaccine: Not documented   Pneumococcal vaccine deferral: Not indicated  (03/23/2009)  Other Screening   Pap smear: normal, per pt. Done by Dr. Stefano Gaul [facility]  (12/08/2008)   Pap smear due: 12/08/2009  Mammogram: ASSESSMENT: Negative - BI-RADS 1^MM DIGITAL SCREENING  (07/16/2009)   Mammogram action/deferral: Not indicated  (07/28/2009)   Mammogram due: 07/28/2009   Smoking status: never  (01/27/2010)  Diabetes Mellitus   HgbA1C: 9.3  (01/27/2010)   Hemoglobin A1C due: 11/12/2007    Eye exam: normal  (08/29/2008)   Eye exam due: 08/2009    Foot exam: yes  (12/08/2008)   High risk foot: Not documented   Foot care education: Not documented    Urine microalbumin/creatinine ratio: Not documented   Urine microalbumin/cr due: 09/18/2009    Diabetes flowsheet reviewed?: Yes   Progress toward A1C goal: Deteriorated  Lipids   Total Cholesterol: 153  (12/03/2008)   LDL: 84  (12/03/2008)   LDL Direct: Not documented   HDL: 53  (12/03/2008)   Triglycerides: 80  (12/03/2008)  Hypertension   Last Blood Pressure: 136 / 79  (01/27/2010)   Serum creatinine: 0.71  (12/03/2008)   Serum potassium 4.3  (12/03/2008) CMP ordered     Hypertension flowsheet reviewed?: Yes   Progress toward BP goal: At goal  Self-Management Support :   Personal Goals (by the next clinic visit) :     Personal A1C goal: 7   (07/28/2009)     Personal blood pressure goal: 130/80  (07/28/2009)     Personal LDL goal: 100  (07/28/2009)    Diabetes self-management support: Written self-care plan  (01/27/2010)   Diabetes care plan printed    Hypertension self-management support: Not documented    Hypertension self-management support not done because: Good outcomes  (01/27/2010)

## 2010-09-28 NOTE — Assessment & Plan Note (Signed)
Summary: fill out paperwork for multiple pain issues   Vital Signs:  Patient profile:   50 year old female Weight:      203 pounds Temp:     99 degrees F oral Pulse rate:   93 / minute Pulse rhythm:   regular BP sitting:   132 / 84  (left arm) Cuff size:   large CC: joint pains Comments pt is really upset because her paperwork has not been filled out properly and states that because of this her home is on the verge of foreclosure and the ins. company will deny her claim and close her case.  she has been calling to find out what has been going on with her paperwork.  she cannot sleep at night because of being in soo much pain. she was given tramadol but it makes her sick. she was told that she could have a rheumatology referral but has not heard anything concerning the appt.   Primary Care Provider:  Jamie Brookes MD  CC:  joint pains.  History of Present Illness: 51 y/o very upset patient comes in today with paperwork she needs filled out. She has multiple pain issues that continue to be a problem for her.   Starting with an UC visit she has been seeking the cause of her pain for the last couple of months. 1st: she was told it was OA and was gien a brace for her let knee. The MD tried to aspirate some fluid off her knee but was unsuccessful.  2nd: she was told it might be gout, fluid was successfully drawn out of knee and it was found to not be uric acid crystals.  3rd: She was prescribed Tramadol and Ibuprofen. THey did not help. 4th: She saw me for FMLA paperwork to be filled out.   In the past she has tried: Tramadol= makes her vomit Ibuprofen= makes her stomach cramp Vidodin= makes her feel like her "throat was closing" Naprosyn- doesn't work Volterin gel- doesn't work Toradol, flexerill and Icy hot help the pain,  She has been to PT for her back and shoulder pain in the past  She has been seen by Dr. Fritzi Mandes in the [facility] for her pain.   > 45 min in face to face time  with this patient. Filled out paperwork for her again.   Allergies (verified): 1)  ! Codeine 2)  ! Penicillin V Potassium (Penicillin V Potassium) 3)  ! Darvocet A500 4)  ! Reglan (Metoclopramide Hcl) 5)  ! Sulfa  Physical Exam  General:  Well-developed,well-nourished,in no acute distress; alert,appropriate and cooperative throughout examination Psych:  pt slightly agitate at the beginning of the visit, but calm by the end of the visit.    Impression & Recommendations:  Problem # 1:  KNEE PAIN, LEFT, ACUTE (ICD-719.46) Assessment Unchanged Pt's has pain in mutliple sites. She is convinced that this is something rheumatologic. Testing done during last visit was neg for RA. However, will refer patient for further eval per her request. Will also try to use Oxycodone for the first time. The patient has "bad reactions" to almost every other type of pain medicine, so I am not terribly hopeful but we will give it a try. At some point I may need to get her back to a pain specialist as she seems to be intolerant to every med we try. May benifit from a TENS unit.   The following medications were removed from the medication list:    Cyclobenzaprine  Hcl 10 Mg Tabs (Cyclobenzaprine hcl) .Marland Kitchen... 1/2 tab to 1 tab by mouth 2 times daily as needed for pain    Ibuprofen 800 Mg Tabs (Ibuprofen) .Marland Kitchen... Take one tab by mouth every 6 hours, always take with food. Her updated medication list for this problem includes:    Oxycodone-acetaminophen 5-325 Mg Tabs (Oxycodone-acetaminophen) .Marland Kitchen... Take 1 pill every 6 hours as needed for pain  Orders: Rheumatology Referral (Rheumatology) [facility]- Est  Level 4 ([address])  Complete Medication List: 1)  Lasix 20 Mg Tabs (Furosemide) .... One tab by mouth four times a week. 2)  Prilosec 20 Mg Cpdr (Omeprazole) .... Take 1 capsule by mouth once a day 3)  Xyzal 5 Mg Tabs (Levocetirizine dihydrochloride) .... Take 1 tablet by mouth at bedtime 4)  Nasacort Aq 55 Mcg/act Aers  (Triamcinolone acetonide(nasal)) .... 2 sprays in each nostril daily 5)  Benzonatate 200 Mg Caps (Benzonatate) .Marland Kitchen.. 1 by mouth three times a day prn 6)  Oxycodone-acetaminophen 5-325 Mg Tabs (Oxycodone-acetaminophen) .... Take 1 pill every 6 hours as needed for pain  Patient Instructions: 1)  We are starting Percocet for your pain.  2)  Come to see me Nov 10th. Make an appointment for this.  3)  We will get your pain under control and get you back to work on Nov 16th.  Prescriptions: OXYCODONE-ACETAMINOPHEN 5-325 MG TABS (OXYCODONE-ACETAMINOPHEN) take 1 pill every 6 hours as needed for pain  #60 x 0   Entered by:   Garen Grams LPN   Authorized by:   Jamie Brookes MD   Signed by:   Jamie Brookes MD on 06/28/2010   Method used:   Handwritten   RxID:   [id number]    Orders Added: 1)  Rheumatology Referral [Rheumatology] 2)  [facility]- Est  Level 4 [[address]657]

## 2010-09-28 NOTE — Progress Notes (Signed)
Summary: meds prob  Phone Note Call from Patient Call back at Home Phone [phone]   Caller: Patient Summary of Call: oxycodone is too strong and making her sick and nausea - broke in half and still does same thing.  needs to know if she can get something else or get a shot also checking on Rheum referral  Initial call taken by: De Nurse,  June 28, 2010 8:48 AM  Follow-up for Phone Call        rheumatology appt made. forwarded phone note to the pcp for medications Follow-up by: Loralee Pacas CMA,  June 29, 2010 12:05 PM  Additional Follow-up for Phone Call Additional follow up Details #1::        I will have to ask one of the attendings.  Additional Follow-up by: Jamie Brookes MD,  July 01, 2010 10:24 PM    Additional Follow-up for Phone Call Additional follow up Details #2::    Has appt today Follow-up by: Jimmy Footman, CMA,  July 06, 2010 11:27 AM

## 2010-09-28 NOTE — Progress Notes (Signed)
Summary: Returning call   Phone Note Call from Patient Call back at Home Phone [phone]   Reason for Call: Talk to Nurse Summary of Call: returning Dr. Weyman Croon call Initial call taken by: Knox Royalty,  January 27, 2010 2:25 PM  Follow-up for Phone Call        to PCP Follow-up by: Gladstone Pih,  January 27, 2010 3:32 PM  Additional Follow-up for Phone Call Additional follow up Details #1::        advised patient of A1C and of need to start  glucotrol. she expressed understanding.  Additional Follow-up by: Lequita Asal  MD,  January 27, 2010 4:31 PM

## 2010-09-28 NOTE — Assessment & Plan Note (Signed)
Summary: f/u eo (resc'd from 8/26 from Dr. Tyson Alias sched)/bmc   Vital Signs:  Patient profile:   50 year old female Height:      65.5 inches Weight:      212.31 pounds BMI:     34.92 Temp:     98.8 degrees F oral Pulse rate:   103 / minute BP sitting:   116 / 74  (right arm)  Vitals Entered By: Terese Door (April 27, 2010 2:08 PM) CC: F/U swollen feet/legs; c/o pain in neck and shoulders Is Patient Diabetic? Yes Pain Assessment Patient in pain? yes     Location: neck/shoulder/ankles Intensity: 10 Type: aching   Primary Provider:  Edd Arbour  CC:  F/U swollen feet/legs; c/o pain in neck and shoulders.  History of Present Illness: Bridget [name] is a pleasant 50 y/o female with obesity. She came to the clinic to discuss back pain, insomnia and what turned out to be depressive symptoms. After careful history and a PHQ9 quistionaire it turns out that Bridget [name] has depression. Her back pain is to the right of her spine, is muscular in character and does not have any tenderness on palpation. she gets the pain when she stands for prolonged periods or when she is in a car for long periods of time.   Problems Prior to Update: 1)  Ankle Pain, Bilateral  (ICD-719.47) 2)  Edema  (ICD-782.3) 3)  Knee Pain, Left, Acute  (ICD-719.46) 4)  Hypertension, Benign Systemic  (ICD-401.1) 5)  Diabetes Mellitus, Type II, Uncontrolled  (ICD-250.02) 6)  Rhinitis, Allergic  (ICD-477.9) 7)  Heel Spur  (ICD-726.73) 8)  Venous Insufficiency, Chronic  (ICD-459.81) 9)  Obesity, Nos  (ICD-278.00) 10)  Leg Pain or Knee Pain  (ICD-729.5) 11)  Gastroesophageal Reflux, No Esophagitis  (ICD-530.81) 12)  Diverticulosis, Colon  (ICD-562.10)  Medications Prior to Update: 1)  Lasix 20 Mg Tabs (Furosemide) .... One Tab By Mouth Four Times A Week. 2)  Prilosec 20 Mg Cpdr (Omeprazole) .... Take 1 Capsule By Mouth Once A Day 3)  Xyzal 5 Mg Tabs (Levocetirizine Dihydrochloride) .... Take 1 Tablet By Mouth At  Bedtime 4)  Nasacort Aq 55 Mcg/act Aers (Triamcinolone Acetonide(Nasal)) .... 2 Sprays in Each Nostril Daily 5)  Naprosyn 500 Mg Tabs (Naproxen) .... One Tab By Mouth Two Times A Day As Needed Pain 6)  Cyclobenzaprine Hcl 10 Mg  Tabs (Cyclobenzaprine Hcl) .... 1/2 Tab To 1 Tab By Mouth 2 Times Daily As Needed For Pain 7)  Benzonatate 200 Mg Caps (Benzonatate) .Marland Kitchen.. 1 By Mouth Three Times A Day Prn  Allergies (verified): 1)  ! Codeine 2)  ! Penicillin V Potassium (Penicillin V Potassium) 3)  ! Darvocet A500 4)  ! Reglan (Metoclopramide Hcl) 5)  ! Sulfa  Past History:  Family History: Last updated: 11/02/07 brother - dm, father - died age 30 ?cause, Mother - died of breast CA age 67, dm  Social History: Last updated: 07/28/2009 Quit Tobacco 10/00; stopped smoking Black & Mild cigars occasionally. dating ex-husband. 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  Has 2 children.  Risk Factors: Exercise: no (09/10/2007)  Risk Factors: Smoking Status: quit (04/21/2010) Passive Smoke Exposure: yes (11/02/2007)  Past medical history reviewed for relevance to current acute and chronic problems. Past surgical history reviewed for relevance to current acute and chronic problems. Social history (including risk factors) reviewed for relevance to current acute and chronic problems.  Past Medical History: Reviewed history from 02/12/2010 and no changes required.  DM HTN allergic rhinitis  Past Surgical History: Reviewed history from 02/12/2010 and no changes required. Anterior repair and pubovesical sling - 05/30/1999 hysterectomy, no h/o abn paps  Rotator Cuff repair/acromioplasty - 08/29/1994  Family History: Reviewed history from 10/04/2007 and no changes required. brother - dm, father - died age 64 ?cause, Mother - died of breast CA age 13, dm  Social History: Reviewed history from 07/28/2009 and no changes required. Quit Tobacco 10/00; stopped smoking Black & Mild cigars  occasionally. dating ex-husband. 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  Review of Systems       The patient complains of weight gain and depression.  The patient denies weight loss, chest pain, dyspnea on exertion, abdominal pain, muscle weakness, and difficulty walking.    Physical Exam  General:  alert, well-developed, cooperative to examination, and overweight-appearing.   Head:  Normocephalic and atraumatic without obvious abnormalities. No apparent alopecia or balding. Eyes:  No corneal or conjunctival inflammation noted. EOMI. Perrla. Funduscopic exam benign, without hemorrhages, exudates or papilledema. Vision grossly normal. Ears:  External ear exam shows no significant lesions or deformities.  Otoscopic examination reveals clear canals, tympanic membranes are intact bilaterally without bulging, retraction, inflammation or discharge. Hearing is grossly normal bilaterally. Nose:  External nasal examination shows no deformity or inflammation. Nasal mucosa are pink and moist without lesions or exudates. Mouth:  Oral mucosa and oropharynx without lesions or exudates.  Teeth in good repair. Neck:  No deformities, masses, or tenderness noted. Chest Wall:  No deformities, masses, or tenderness noted. Lungs:  Normal respiratory effort, chest expands symmetrically. Lungs are clear to auscultation, no crackles or wheezes. Heart:  Normal rate and regular rhythm. S1 and S2 normal without gallop, murmur, click, rub or other extra sounds. Abdomen:  Bowel sounds positive,abdomen soft and non-tender without masses, organomegaly or hernias noted. Msk:  no joint deformities.   nontender exam of the back. normal muscle palpations. no spinal defects. Extremities:  No clubbing, cyanosis, edema, or deformity noted with normal full range of motion of all joints.   Neurologic:  No cranial nerve deficits noted. Station and gait are normal. Plantar reflexes are down-going bilaterally. DTRs  are symmetrical throughout. Sensory, motor and coordinative functions appear intact. Psych:  Oriented X3 and normally interactive.     Impression & Recommendations:  Problem # 1:  BACK PAIN (ICD-724.5)  The following medications were removed from the medication list:    Naprosyn 500 Mg Tabs (Naproxen) ..... One tab by mouth two times a day as needed pain Her updated medication list for this problem includes:    Cyclobenzaprine Hcl 10 Mg Tabs (Cyclobenzaprine hcl) .Marland Kitchen... 1/2 tab to 1 tab by mouth 2 times daily as needed for pain    Ibuprofen 800 Mg Tabs (Ibuprofen) .Marland Kitchen... Take one tab by mouth every 6 hours, always take with food.    Tramadol Hcl 50 Mg Tabs (Tramadol hcl) .Marland Kitchen... Take one tab by mouth every 6 hours, always take with food.  Problem # 2:  DEPRESSION (ICD-311)  Problem # 3:  HYPOTHYROIDISM (ICD-244.9) TSH 32  Complete Medication List: 1)  Lasix 20 Mg Tabs (Furosemide) .... One tab by mouth four times a week. 2)  Prilosec 20 Mg Cpdr (Omeprazole) .... Take 1 capsule by mouth once a day 3)  Xyzal 5 Mg Tabs (Levocetirizine dihydrochloride) .... Take 1 tablet by mouth at bedtime 4)  Nasacort Aq 55 Mcg/act Aers (Triamcinolone acetonide(nasal)) .... 2 sprays in each  nostril daily 5)  Cyclobenzaprine Hcl 10 Mg Tabs (Cyclobenzaprine hcl) .... 1/2 tab to 1 tab by mouth 2 times daily as needed for pain 6)  Benzonatate 200 Mg Caps (Benzonatate) .Marland Kitchen.. 1 by mouth three times a day prn 7)  Ibuprofen 800 Mg Tabs (Ibuprofen) .... Take one tab by mouth every 6 hours, always take with food. 8)  Tramadol Hcl 50 Mg Tabs (Tramadol hcl) .... Take one tab by mouth every 6 hours, always take with food.  Other Orders: [facility]- Est Level  3 (04540)  Patient Instructions: 1)  it was a pleasure meeting with you today. 2)  Please make sure to take the Ibuprofen, tramadol and food together to avoid irritation to your stomach. 3)  Thank you,  4)  Please schedule a follow-up appointment in 1 month. 5)   Avoid foods high in acid (tomatoes, citrus juices, spicy foods). Avoid eating within two hours of lying down or before exercising. Do not over eat; try smaller more frequent meals. Elevate head of bed twelve inches when sleeping. 6)  It is important that you exercise regularly at least 20 minutes 5 times a week. If you develop chest pain, have severe difficulty breathing, or feel very tired , stop exercising immediately and seek medical attention. 7)  You need to lose weight. Consider a lower calorie diet and regular exercise.  8)  It is important that your Diabetic A1c level is checked every 3 months. 9)  Take 400-600mg  of Ibuprofen (Advil, Motrin) with food every 4-6 hours as needed for relief of pain or comfort of fever. Prescriptions: IBUPROFEN 800 MG TABS (IBUPROFEN) take one tab by mouth every 6 hours, always take with food.  #120 x 2   Entered and Authorized by:   Edd Arbour   Signed by:   Edd Arbour on 04/27/2010   Method used:   Printed then faxed to ...       [facility]* (retail)       [address]       [address]       [place], [place]  [address]       Ph: [phone]       Fax: [phone]   RxID:   [id number] TRAMADOL HCL 50 MG TABS (TRAMADOL HCL) take one tab by mouth every 6 hours, always take with food.  #90 x 2   Entered and Authorized by:   Edd Arbour   Signed by:   Edd Arbour on 04/27/2010   Method used:   Printed then faxed to ...       [facility]* (retail)       [address]       [address]       [place], [place]  [address]       Ph: [phone]       Fax: [phone]   RxID:   [id number]

## 2010-09-28 NOTE — Progress Notes (Signed)
Summary: Rx Req  Phone Note Call from Patient Call back at Home Phone [phone]   Caller: Patient Summary of Call: Pt says her rxs were to go to the [facility] employee pharmacy.  They have not recieved them yet. Initial call taken by: Clydell Hakim,  May 04, 2010 8:36 AM  Follow-up for Phone Call        rx called to [facility]. Follow-up by: Theresia Lo RN,  May 04, 2010 11:58 AM

## 2010-09-28 NOTE — Assessment & Plan Note (Signed)
Summary: L knee pain/Reeves/orton   Vital Signs:  Patient profile:   50 year old female Weight:      226 pounds Temp:     99.4 degrees F oral Pulse rate:   88 / minute Pulse rhythm:   regular BP sitting:   114 / 71  (left arm) Cuff size:   large  Vitals Entered By: Loralee Pacas CMA (April 07, 2010 9:38 AM)  Primary Care Provider:  Edd Arbour   History of Present Illness: CC:  LEFT knee pain  HPI:  Patient presenting with 1 week history of knee pain.  Started Monday of last week when gardening in AM.  Lots of standing and sitting on stool she carries around garden.  Began experiencing knee pain later that day.  As week progressed, c/o worseing swelling and pain in knee joint.  Went to Urgent Care on Thursday, x-rays performed and showed arthritic changes with questionable small effusion.  Prescribed Tramadol, she did not fill b/c she has "reactions" to many pain meds.  Minimal relief alternating Ibuprofen/Tylenol, rest, ice, and elevation.  Wearing compression bandage around knee.  Worsened by walking, standing, bearing weight.    ROS: no redness or warmth in knee or other joints.  No history of recent surgery or immoblization. No recent illnesses, CP, SOB  Current Problems (verified): 1)  Hypertension, Benign Systemic  (ICD-401.1) 2)  Diabetes Mellitus, Type II, Uncontrolled  (ICD-250.02) 3)  Rhinitis, Allergic  (ICD-477.9) 4)  Heel Spur  (ICD-726.73) 5)  Venous Insufficiency, Chronic  (ICD-459.81) 6)  Obesity, Nos  (ICD-278.00) 7)  Leg Pain or Knee Pain  (ICD-729.5) 8)  Gastroesophageal Reflux, No Esophagitis  (ICD-530.81) 9)  Diverticulosis, Colon  (ICD-562.10)  Current Medications (verified): 1)  Lasix 20 Mg Tabs (Furosemide) .... One Tab By Mouth Four Times A Week. 2)  Prilosec 20 Mg Cpdr (Omeprazole) .... Take 1 Capsule By Mouth Once A Day 3)  Xyzal 5 Mg Tabs (Levocetirizine Dihydrochloride) .... Take 1 Tablet By Mouth At Bedtime 4)  Nasacort Aq 55 Mcg/act Aers  (Triamcinolone Acetonide(Nasal)) .... 2 Sprays in Each Nostril Daily 5)  Naprosyn 500 Mg Tabs (Naproxen) .... One Tab By Mouth Two Times A Day As Needed Pain 6)  Cyclobenzaprine Hcl 10 Mg  Tabs (Cyclobenzaprine Hcl) .... 1/2 Tab To 1 Tab By Mouth 2 Times Daily As Needed For Pain 7)  Glucotrol 10 Mg Tabs (Glipizide) .... One Tab By Mouth Daily  Allergies (verified): 1)  ! Codeine 2)  ! Penicillin V Potassium (Penicillin V Potassium) 3)  ! Darvocet A500 4)  ! Reglan (Metoclopramide Hcl) 5)  ! Sulfa  Past History:  Past medical, surgical, family and social histories (including risk factors) reviewed, and no changes noted (except as noted below).  Past Medical History: Reviewed history from 02/12/2010 and no changes required. DM HTN allergic rhinitis  Past Surgical History: Reviewed history from 02/12/2010 and no changes required. Anterior repair and pubovesical sling - 05/30/1999 hysterectomy, no h/o abn paps  Rotator Cuff repair/acromioplasty - 08/29/1994  Family History: Reviewed history from 10/04/2007 and no changes required. brother - dm, father - died age 5 ?cause, Mother - died of breast CA age 10, dm  Social History: Reviewed history from 07/28/2009 and no changes required. Quit Tobacco 10/00; stopped smoking Black & Mild cigars occasionally. dating ex-husband. 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  Physical Exam  General:  Vital signs reviewed. Well-developed, well-nourished patient in NAD.  Awake and  cooperative  Lungs:  Normal respiratory effort, chest expands symmetrically. Lungs are clear to auscultation, no crackles or wheezes. Msk:  Left knee  mildly edematous when compared to RIght knee.  No warmth or erythema noted.  No lesions or open wounds on inspection.  Pain along both medial and lateral aspect of head of tibia.  Markedly decreased active ROM due to pain, somewhat decreased passive ROM.  Patient refused gait exam due to pain.  No problems  with other joints.  Able to palpate small amount of effusion within medial aspect of joint. Anterior and posterior drawer test negative.  Refused any further orthopedic testing due to pain.   Extremities:  No erythema or edema of BL calves or thighs.     Impression & Recommendations:  Problem # 1:  LEG PAIN OR KNEE PAIN (ICD-729.5) Assessment New Most likely traumatic injury to knee.  Precepted case with Dr. Mauricio Po.  Attempted arthrocentesis to remove effusion.  Skin prepped with Betadine x 3.  Usual sterile draping and precautions observed.  Skin numbed with 2 cc of 1% Lidocaine without epi.  Unable to obtain any fluid from knee, Dr. Mauricio Po also attempted once without success.  Patient tolerated procedure well.  Did not inject steroids into knee for symptomatic relief because of effusion and desire not to add more fluid to an already inflammed and somewhat swollen joint.  Gave 60 mg Toradol for pain relief.  Recommended Ibuprofen for continued pain relief.  Cannot rule out ligament or cartilage damage in obese patient who was in too much acute pain to cooperate with knee testing, therefore plan to obtain MRI and refer patient to sports medicine.  Also recommended continued rest, ice, and elevation as needed.   Complete Medication List: 1)  Lasix 20 Mg Tabs (Furosemide) .... One tab by mouth four times a week. 2)  Prilosec 20 Mg Cpdr (Omeprazole) .... Take 1 capsule by mouth once a day 3)  Xyzal 5 Mg Tabs (Levocetirizine dihydrochloride) .... Take 1 tablet by mouth at bedtime 4)  Nasacort Aq 55 Mcg/act Aers (Triamcinolone acetonide(nasal)) .... 2 sprays in each nostril daily 5)  Naprosyn 500 Mg Tabs (Naproxen) .... One tab by mouth two times a day as needed pain 6)  Cyclobenzaprine Hcl 10 Mg Tabs (Cyclobenzaprine hcl) .... 1/2 tab to 1 tab by mouth 2 times daily as needed for pain 7)  Glucotrol 10 Mg Tabs (Glipizide) .... One tab by mouth daily  Other Orders: [facility]- Est  Level 4 (77824) Sports  Medicine (Sports Med)  Patient Instructions: 1)  Continue to rest and ice your knee as much as possible. 2)  We will refer you to Sports Medicine clinic to see if they have any more ideas.   3)  We will also send you for an MRI of your knee to make sure there's not any ligament or cartilage damage.  4)  Make an appointment to follow-up here in 2-3 weeks so we can make sure you're improving. 5)  It was good to meet you today!   Medication Administration  Injection # 1:    Medication: Ketorolac-Toradol 15mg     Diagnosis: LEG PAIN OR KNEE PAIN (ICD-729.5)    Route: IM    Site: LUOQ gluteus    Exp Date: 12/26/2012    Lot #: [id number]    Mfr: [facility]    Comments: pt given 60mg     Patient tolerated injection without complications    Given by: Loralee Pacas CMA (April 07, 2010  5:26 PM)  Orders Added: 1)  [facility]- Est  Level 4 [[address]] 2)  Sports Medicine [Sports Med]

## 2010-09-28 NOTE — Progress Notes (Signed)
  Phone Note Call from Patient   Caller: Patient Call For: [phone] Summary of Call: Patient need to see you regarding her FMLA papers. The information still not right.   Patient says that she need to have the date of services for the period the doctor feels she need to be out and when the patient can be ready to return to work.  The information on the documents now do not support patients  FMLA because patient have been working during the period the documents say she should be out. Initial call taken by: Abundio Miu,  June 03, 2010 11:07 AM  Follow-up for Phone Call        How very frustrating! I thought that she had been out of work since our last visit so I dated it accordingly. I want the patient to have a 6 week period of no work and then restart working at part time of 4 hours a day. I will write a letter expalining this.  Follow-up by: Jamie Brookes MD,  June 03, 2010 12:37 PM

## 2010-09-28 NOTE — Progress Notes (Signed)
  Phone Note Outgoing Call   Call placed by: Jimmy Footman, CMA,  June 10, 2010 8:48 AM Summary of Call: Spoke with patient to let her know labs were normal per dr. Clotilde Dieter.  Marland Kitchen

## 2010-09-28 NOTE — Miscellaneous (Signed)
Summary: walk in   Clinical Lists Changes c/o l knee pain x 1-2 wks. started when she was out weeding her yard. went to UC. films done. was given rx for tramadol which she did not fill. states she is very sensative to pain meds. elastic brace on knee. difficulty getting up from a chair or walking. 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 placed in [place] & VS obtained.Marland KitchenMarland KitchenGolden Circle RN  April 07, 2010 9:38 AM  Problems: Added new problem of KNEE PAIN, LEFT, ACUTE (ICD-719.46) Orders: Added new Test order of MRI without Contrast (MRI w/o Contrast) - Signed

## 2010-09-28 NOTE — Letter (Signed)
Summary: Out of Work  [facility]  [address]   [place], [place] [address]   Phone: [phone]  Fax: [phone]    July 06, 2010   Employee:  MIKYA DON    To Whom It May Concern:   Pt may return to work but part time (4 hours a day) until 10-03-10 as stated in the [facility] paperwork.   If you need additional information, please feel free to contact our office.         Sincerely,    Jamie Brookes MD

## 2010-09-28 NOTE — Progress Notes (Signed)
Summary: needs meds and dates  Phone Note Call from Patient Call back at Home Phone [phone]   Caller: Patient Summary of Call: didn't get a date for her FMLA -her work says they need a date stating what date she is being taken out of work [employer]- [phone]  also needed Diflucan for her yeast inf. [facility]  Initial call taken by: De Nurse,  May 21, 2010 4:09 PM  Follow-up for Phone Call        forwarded to pcp Follow-up by: Loralee Pacas CMA,  May 21, 2010 4:14 PM  Additional Follow-up for Phone Call Additional follow up Details #1::        LM that she may want to use UC or call monday to handle issues Additional Follow-up by: Golden Circle RN,  May 21, 2010 4:26 PM    Additional Follow-up for Phone Call Additional follow up Details #2::    Patient returning nurse's call.  Want nurse to call her back.  Patient doesn't feel she need to go to Urgent Care when she's already been to the practice for problem.  Need to have someone call meds for her for problem. Follow-up by: Abundio Miu,  May 21, 2010 4:33 PM  Additional Follow-up for Phone Call Additional follow up Details #3:: Details for Additional Follow-up Action Taken: she is very irate & refused to go to UC or back here for the diflucan. states this is a chronic issue & her previous md "just called it in". told her there was nothing I could do other than send it to her md. she voiced displease with the short visit she had recently. states the md did not give her much time. wants FLMA forms done as she states she is not going to work & hurt for 8 hours without any meds. to new pcp. Dr. Clotilde Dieter Additional Follow-up by: Golden Circle RN,  May 21, 2010 4:34 PM  I saw this patient for the first time a couple of days ago. I have never seen her for a yeast infection. If she is very concerned she can get monostat over the counter or go to UC. I spent over 45 min with this patient  filling out her FMLA paperwork and listening to her pain concerns. I put a time frame of 6 months on her paperwork so a date can be established as of her last appointment through 6 months from then. I tied to call the number to her HR person (Ms. Yetta Barre) but it is a fax number. Please ask her to leave an alternate number so we can call on Monday. Thanks. Jamie Brookes MD  May 21, 2010 5:57 PM

## 2010-09-28 NOTE — Progress Notes (Signed)
Summary: left message for pt   Phone Note Outgoing Call   Call placed by: Jasie Meleski [place] MD,  April 23, 2010 11:05 AM Summary of Call: Called pt to check on how she is feeling.  No answer, so left message.  Will try to call later, but asked pt to call us if she is still having problems. I am not in office today, but please feel free to page me [phone] if pt calls and is still having trouble with her feet. Initial call taken by: Kole Hilyard [place] MD,  April 23, 2010 11:07 AM

## 2010-09-28 NOTE — Miscellaneous (Signed)
Summary: Med. Record request  Recieved medical record request: 07/19/2010 To go to: [facility] Date Sent:07/19/2010 Marily Memos  July 27, 2010 9:44 AM

## 2010-09-28 NOTE — Miscellaneous (Signed)
Summary: Disability claim form  Form dropped off to be filled out for disabilty.  Please call her when completed. Bradly Bienenstock  June 29, 2010 11:13 AM  Disability form placed in Dr Lorelee Market box for completion.  Terese Door  June 29, 2010 12:01 PM   I just looked at the forms. There is information on there that I can't answer without the patient. She needs to make an appointment so we can go over the forms together. Have her make an appointment at the end of the day (4:30ish) so we can focus on the forms and not feel rushed. Thanks. Jamie Brookes MD  June 29, 2010 1:42 PM  Appt. scheduled 07/06/10 @ 4:15 to fill out Disability claim form per Dr. Clotilde Dieter ....................Marland KitchenTerese Door  June 29, 2010 2:00 PM

## 2010-09-28 NOTE — Assessment & Plan Note (Signed)
Summary: np/l knee pain/per [place]/eo   Vital Signs:  Patient profile:   50 year old female BP sitting:   121 / 78  Vitals Entered By: Lillia Pauls CMA (April 13, 2010 1:55 PM)  History of Present Illness: the patient is a very pleasant 50 year old African female seen at the request of the [facility] Dr. Gwendolyn Grant, and at this point she is having very severe left-sided knee pain. She has no inciting event, no traumatic injury, or history of any significant knee pain, fracture, or operative intervention.  Approximately 2 weeks ago, she has developed an acute onset of knee pain. She is a very large effusion. The medial side aspiration attempt was made at the [facility], however, this was not successful.  The patient is been taking some Naprosyn. She has some difficulty with narcotic products, and is not been able to taking tramadol.  No known gout history.  MRI, left knee, independently reviewed by myself. There is some evidence of T2 signal at the patella, most consistent with chondromalacia patella. Results of some other signs of osteoarthritic change. The most dramatic finding is a very large effusion. No evidence of occult meniscal pathology. ACL is intact. PCL is intact.  REVIEW OF SYSTEMS  GEN: No systemic complaints, no fevers, chills, sweats, or other acute illnesses MSK: Detailed in the HPI GI: tolerating PO intake without difficulty Neuro: No numbness, parasthesias, or tingling associated. Otherwise the pertinent positives of the ROS are noted above.    Allergies: 1)  ! Codeine 2)  ! Penicillin V Potassium (Penicillin V Potassium) 3)  ! Darvocet A500 4)  ! Reglan (Metoclopramide Hcl) 5)  ! Sulfa  Past History:  Past medical, surgical, family and social histories (including risk factors) reviewed, and no changes noted (except as noted below).  Past Medical History: Reviewed history from 02/12/2010 and no changes required. DM HTN allergic rhinitis  Past Surgical History: Reviewed  history from 02/12/2010 and no changes required. Anterior repair and pubovesical sling - 05/30/1999 hysterectomy, no h/o abn paps  Rotator Cuff repair/acromioplasty - 08/29/1994   Family History: Reviewed history from 10/04/2007 and no changes required. brother - dm, father - died age 55 ?cause, Mother - died of breast CA age 52, dm  Social History: Reviewed history from 07/28/2009 and no changes required. Quit Tobacco 10/00; stopped smoking Black & Mild cigars occasionally. dating ex-husband. 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  Has 2 children.  Physical Exam  General:  GEN: Well-developed,well-nourished,in no acute distress; alert,appropriate and cooperative throughout examination HEENT: Normocephalic and atraumatic without obvious abnormalities. No apparent alopecia or balding. Ears, externally no deformities PULM: Breathing comfortably in no respiratory distress EXT: No clubbing, cyanosis, or edema PSYCH: Normally interactive. Cooperative during the interview. Pleasant. Friendly and conversant. Not anxious or depressed appearing. Normal, full affect.  Msk:  left knee: Nontender along the patient's tibia and fibula. Nontender at the joint lines. Patient is a very large effusion, and is minimally able to flex and extend the knee. Unable to complete a special testing of the knee. Anterior drawer posterior drawer testing is normal.   Impression & Recommendations:  Problem # 1:  KNEE PAIN, LEFT, ACUTE (ICD-719.46) Assessment New large effusion, exact etiology unclear. Differential diagnosis would include gout, pseudogout, osteoarthritis flare, occult internal derangement, however given the fact the patient has already had an MRI, with no meniscal or ligamentous injury, medical or OA pathology most likely.  Full synovial panel eval.  Knee Aspiration and Injection Patient verbally consented;  risks, benefits, and alternatives explained. Patient prepped with betadine. Ethyl chloride for  anesthesia. 10 cc of 1% Lidocaine used in wheal then injected Subcutaneous fashion with 27 gauge needle on lateral approach. Under sterilne conditions, 18 gauge needle used via lateral approach to aspirate 45 cc of yellow, cloudy fluid. Then 9 cc of Lidocaine 1% and 1 cc of Kenalog 40 mg injected. Tolerated well, decreased pain, no complications.   Her updated medication list for this problem includes:    Naprosyn 500 Mg Tabs (Naproxen) ..... One tab by mouth two times a day as needed pain    Cyclobenzaprine Hcl 10 Mg Tabs (Cyclobenzaprine hcl) .Marland Kitchen... 1/2 tab to 1 tab by mouth 2 times daily as needed for pain    Meloxicam 15 Mg Tabs (Meloxicam) .Marland Kitchen... 1 by mouth daily  Orders: Joint Aspirate / Injection, Large (20610) Kenalog 10mg  (4units) (J3301)Future Orders: Miscellaneous Lab Charge-[facility] ([address]) ... 03/29/2012  Complete Medication List: 1)  Lasix 20 Mg Tabs (Furosemide) .... One tab by mouth four times a week. 2)  Prilosec 20 Mg Cpdr (Omeprazole) .... Take 1 capsule by mouth once a day 3)  Xyzal 5 Mg Tabs (Levocetirizine dihydrochloride) .... Take 1 tablet by mouth at bedtime 4)  Nasacort Aq 55 Mcg/act Aers (Triamcinolone acetonide(nasal)) .... 2 sprays in each nostril daily 5)  Naprosyn 500 Mg Tabs (Naproxen) .... One tab by mouth two times a day as needed pain 6)  Cyclobenzaprine Hcl 10 Mg Tabs (Cyclobenzaprine hcl) .... 1/2 tab to 1 tab by mouth 2 times daily as needed for pain 7)  Glucotrol 10 Mg Tabs (Glipizide) .... One tab by mouth daily 8)  Meloxicam 15 Mg Tabs (Meloxicam) .Marland Kitchen.. 1 by mouth daily  Patient Instructions: 1)  Full synovial fluid panel including crystals and culture (spectrum synovial fluid panel) 2)  After you have an injection of any joint, the numbing medicine will make it feel better for a few hours. Later tonight, it is common for the joint to feel worse. The steroid will take 48-72 hours to start working - it is the thing that will likely provide the most relief. 3)   Ice the joint where you had the injection at least 2-3 times a day for 20 minutes for 3 days. If have had swelling, pain in the joint itself, ice for 1 week. 4)  You can use an ice bag, frozen peas or corn, an ice pack - all work  Prescriptions: MELOXICAM 15 MG TABS (MELOXICAM) 1 by mouth daily  #30 x 2   Entered and Authorized by:   Hannah Beat MD   Signed by:   Hannah Beat MD on 04/13/2010   Method used:   Electronically to        [facility]* (retail)       [address]       [address] Shipping/mailing       [place], [place]  [address]       Ph: [phone]       Fax: [phone]   RxID:   [id number]

## 2010-09-28 NOTE — Miscellaneous (Signed)
Summary: pain meds needed  Clinical Lists Changes Pt presented to [facility] with complaints of continuing pain, unable to tolerate any of the oral meds due to vomiting.  WIll try course of lidocaine patches for back pain and Voltaren gel for knee pain. Sarah Swaziland MD  May 13, 2010 12:10 PM  Medications: Added new medication of VOLTAREN 1 % GEL (DICLOFENAC SODIUM) Apply 2 g to each knee QID as needed pain. - Signed Added new medication of LIDODERM 5 % PTCH (LIDOCAINE) Apply 1 patch to back for up to 12 hours daily as needed for pain - Signed Rx of VOLTAREN 1 % GEL (DICLOFENAC SODIUM) Apply 2 g to each knee QID as needed pain.;  #1 tube x 3;  Signed;  Entered by: Sarah Swaziland MD;  Authorized by: Sarah Swaziland MD;  Method used: Electronically to [facility]*, [address] Shipping/mailing, [place], [place]  [address], Ph: [phone], Fax: [phone] Rx of LIDODERM 5 % PTCH (LIDOCAINE) Apply 1 patch to back for up to 12 hours daily as needed for pain;  #1 box x 3;  Signed;  Entered by: Sarah Swaziland MD;  Authorized by: Sarah Swaziland MD;  Method used: Electronically to [facility]*, [address], [address] Shipping/mailing, [place], [place]  [address], Ph: [phone], Fax: [phone]    Prescriptions: LIDODERM 5 % PTCH (LIDOCAINE) Apply 1 patch to back for up to 12 hours daily as needed for pain  #1 box x 3   Entered and Authorized by:   Sarah Swaziland MD   Signed by:   Sarah Swaziland MD on 05/13/2010   Method used:   Electronically to        [facility]* (retail)       [address]       [address] Shipping/mailing       [place], [place]  [address]       Ph: [phone]       Fax: [phone]   RxID:   [id number] VOLTAREN 1 % GEL (DICLOFENAC SODIUM) Apply 2 g to each knee QID as needed pain.  #1 tube x 3   Entered and Authorized by:   Sarah Swaziland MD   Signed by:   Sarah Swaziland MD on 05/13/2010  Method used:   Electronically to        [facility]* (retail)       [address]       [address] Shipping/mailing       [place], [place]  [address]       Ph: [phone]       Fax: [phone]   RxID:   [id number]  I called & told pt that these meds were sent to pharmacy. states the pain center gave her the voltaren gel. it was "too expensive & did not work". she will try the patches and keep her appt next wed. states she no longer goes to the pain center since they could not help her. stats she cannot take narcotics.[facility] RN  May 13, 2010 1:42 PM   Noted. appears to be a compliated case since most meds are either not working or she can't take them. I will discuss her past medical use when she comes on Wednesday and hopefully we will be able to come up with a solution. Jamie Brookes MD  May 14, 2010 8:53 AM

## 2010-09-28 NOTE — Letter (Signed)
Summary: Out of [facility]  [address]   [place], [place] [address]   Phone: [phone]  Fax: [phone]    April 13, 2010   Student:  Amelia Jo    To Whom It May Concern:   For Medical reasons, please excuse the above named student from school for the following dates:  Start:   April 13, 2010  End:    April 16, 2010    If you need additional information, please feel free to contact our office.   Sincerely,    Lillia Pauls CMA    ****This is a legal document and cannot be tampered with.  Schools are authorized to verify all information and to do so accordingly.

## 2010-09-28 NOTE — Progress Notes (Signed)
Summary: referral  Phone Note Call from Patient Call back at Home Phone [phone]   Caller: Patient Summary of Call: pt is asking about referral to rheumatology - she is getting worse. Initial call taken by: De Nurse,  June 21, 2010 8:53 AM  Follow-up for Phone Call        yes I can refer her to a rheumatologist. she is going to see Dr. Clotilde Dieter on Weds.  Follow-up by: Edd Arbour,  June 21, 2010 1:24 PM

## 2010-09-28 NOTE — Miscellaneous (Signed)
Summary: walk in & FLMA  Clinical Lists Changes walked in asking for FLMA forms to be done now. Told her it is best to have them done by her md who is not here now. told her we can see her for the pain she has. she is crying. offered appt tomorrow or use of UC now. she is going to the beach in am & will not be here. refused to use UC.  wants to change mds since her current pcp is not here in ams. she works 3rd shift & does not want to break up her sleep to come in to see him. spoke with Dennison Nancy, RN & changed her to Dr. Clotilde Dieter. made her an appt next wed with Dr. Clotilde Dieter who will see her & complete the FMLA forms. placed in her chart box.asked Dr. Swaziland who has seen her to please order something else for her pain before she leaves for the beach. states the tramadol & ibu make her vomit, even with food.[facility] RN  May 13, 2010 12:17 PM  Noted, will be happy to see her next Wednesday. Jamie Brookes MD  May 14, 2010 8:51 AM

## 2010-09-28 NOTE — Progress Notes (Signed)
Summary: triage   Phone Note Call from Patient Call back at Home Phone [phone]   Caller: Patient Summary of Call: feet and ankles are swollen so much that she can't walk (painful) - she has appt w/ Rivka Safer next Tues but doesn't think she can wait Initial call taken by: De Nurse,  April 21, 2010 9:53 AM  Follow-up for Phone Call        started after OV at [facility].  getting worse despite lasix. placed with Dr. Swaziland who has an opening due to a cancellation Follow-up by: [facility] RN,  April 21, 2010 9:55 AM

## 2010-09-28 NOTE — Progress Notes (Signed)
Summary: triage  Medications Added ZITHROMAX Z-PAK 250 MG TABS (AZITHROMYCIN) two tabs by mouth x1 day, then one tab by mouth daily x4 days.       Phone Note Call from Patient Call back at Home Phone [phone]   Caller: Patient Summary of Call: pt is requesting an antibiotic Initial call taken by: De Nurse,  February 02, 2010 1:37 PM  Follow-up for Phone Call        wants antibiotic for c/o congestion & allergies. offered appt.  states "I am not coming back in there" has multiple meds at pharmacy that she has not picked up. will have money Thursday. is using her allergy meds with no relief. has tried all meds. wants referral to a specialist.   she was very firm that she knew her body & "they" will not give me antibiotics "& I know I need them" told her I will send the request to pcp uses [facility] Follow-up by: [facility] RN,  February 02, 2010 1:45 PM  Additional Follow-up for Phone Call Additional follow up Details #1::        will refer to ENT. too early to be considered bacterial sinusitis, however, will send rx for azithromycin.  Additional Follow-up by: Lequita Asal  MD,  February 02, 2010 2:59 PM    Additional Follow-up for Phone Call Additional follow up Details #2::    LM that rx was at pharmacy Follow-up by: [facility] RN,  February 02, 2010 3:26 PM  New/Updated Medications: ZITHROMAX Z-PAK 250 MG TABS (AZITHROMYCIN) two tabs by mouth x1 day, then one tab by mouth daily x4 days. Prescriptions: ZITHROMAX Z-PAK 250 MG TABS (AZITHROMYCIN) two tabs by mouth x1 day, then one tab by mouth daily x4 days.  #6 x 0   Entered and Authorized by:   Lequita Asal  MD   Signed by:   Lequita Asal  MD on 02/02/2010   Method used:   Electronically to        [facility]* (retail)       [address]       [address] [place] [address]mailing       [place], [place]  [address]       Ph: [phone]       Fax: [phone]   RxID:    [id number]

## 2010-09-28 NOTE — Miscellaneous (Signed)
Summary: ROI  ROI   Imported By: Clydell Hakim 04/09/2010 12:12:55  _____________________________________________________________________  External Attachment:    Type:   Image     Comment:   External Document

## 2010-09-28 NOTE — Letter (Signed)
Summary: Out of Work  [facility]  [address]   [place], [place] [address]   Phone: [phone]  Fax: [phone]    June 03, 2010   Employee:  AMIRAH GOERKE    To Whom It May Concern:   For Medical reasons, please excuse the above named employee from work for the following dates:  Start:   Jun 03, 2010  End:   Jul 14, 2010  I would like this patient to have a 6 week period of no work to try to get her pain under control and then start back working part time. This will give [place] a chance to work on pain control.   If you need additional information, please feel free to contact our office.         Sincerely,    Jamie Brookes MD

## 2010-09-28 NOTE — Assessment & Plan Note (Signed)
Summary: shoulder, knee, ankle pain   Vital Signs:  Patient profile:   50 year old female Height:      65.5 inches Weight:      206 pounds BMI:     33.88 Temp:     98.7 degrees F oral Pulse rate:   101 / minute BP sitting:   119 / 72  (left arm) Cuff size:   large  Vitals Entered By: Tessie Fass CMA (May 19, 2010 10:37 AM) CC: joint pain Is Patient Diabetic? Yes Pain Assessment Patient in pain? yes        Primary Care Provider:  Edd Arbour  CC:  joint pain.  History of Present Illness: Joint pain: Pt has left shoulder stiffness, left knee pain and bilateral ankle pain/swelling. Pt says that her pain is a 10/10. She has had these pains for a while but they have gottenPt has been seen down at [facility] in the past. She started having Left knee swelling on Monday and on Thursday went to the UC. She says that the only thing that helps her pain is a heating pad and hot water jacuzzi. Tramadol and ibuprofen make her nauseated and causes vomiting. She is seen at the pain center by Dr. Fritzi Mandes. She has used a TENS unit for 30 days but then her insurance did not approve for her to keep it so she had to give it back. She says that it worked very well.  She has had x-rays of the Shoulder showinb OA and has had injections in it. She also had an MRI on 04-13-10 showing synovitis.  Pt had FMLA paperwork to be filled out.    I spent >45 min with this patient in face to face time.   Habits & Providers  Alcohol-Tobacco-Diet     Tobacco Status: quit  Current Medications (verified): 1)  Lasix 20 Mg Tabs (Furosemide) .... One Tab By Mouth Four Times A Week. 2)  Prilosec 20 Mg Cpdr (Omeprazole) .... Take 1 Capsule By Mouth Once A Day 3)  Xyzal 5 Mg Tabs (Levocetirizine Dihydrochloride) .... Take 1 Tablet By Mouth At Bedtime 4)  Nasacort Aq 55 Mcg/act Aers (Triamcinolone Acetonide(Nasal)) .... 2 Sprays in Each Nostril Daily 5)  Cyclobenzaprine Hcl 10 Mg  Tabs (Cyclobenzaprine Hcl) ....  1/2 Tab To 1 Tab By Mouth 2 Times Daily As Needed For Pain 6)  Benzonatate 200 Mg Caps (Benzonatate) .Marland Kitchen.. 1 By Mouth Three Times A Day Prn 7)  Ibuprofen 800 Mg Tabs (Ibuprofen) .... Take One Tab By Mouth Every 6 Hours, Always Take With Food. 8)  Voltaren 1 % Gel (Diclofenac Sodium) .... Apply 2 G To Each Knee Qid As Needed Pain. 9)  Lidoderm 5 % Ptch (Lidocaine) .... Apply 1 Patch To Back For Up To 12 Hours Daily As Needed For Pain  Allergies: 1)  ! Codeine 2)  ! Penicillin V Potassium (Penicillin V Potassium) 3)  ! Darvocet A500 4)  ! Reglan (Metoclopramide Hcl) 5)  ! Sulfa  Review of Systems        vitals reviewed and pertinent negatives and positives seen in HPI   Physical Exam  General:  Well-developed,well-nourished,in no acute distress; alert,appropriate and cooperative throughout examination Msk:  no joint erythema, no joint swelling, pain with ROM testing.    Impression & Recommendations:  Problem # 1:  OSTEOARTHRITIS, KNEE (ICD-715.96) Assessment Unchanged Pt is having worsening pain. Gets pain meds from Dr. Fritzi Mandes.   The following medications were removed from the medication  list:    Tramadol Hcl 50 Mg Tabs (Tramadol hcl) .Marland Kitchen... Take one tab by mouth every 6 hours, always take with food. Her updated medication list for this problem includes:    Cyclobenzaprine Hcl 10 Mg Tabs (Cyclobenzaprine hcl) .Marland Kitchen... 1/2 tab to 1 tab by mouth 2 times daily as needed for pain    Ibuprofen 800 Mg Tabs (Ibuprofen) .Marland Kitchen... Take one tab by mouth every 6 hours, always take with food.  Orders: Physical Therapy Referral (PT) [facility]- Est  Level 4 ([address])  Problem # 2:  ANKLE PAIN, BILATERAL (ICD-719.47) Assessment: Deteriorated Pt is worse in her ankles. Pt appears to have some minimal swelling.   Orders: [facility]- Est  Level 4 ([address])  Problem # 3:  SHOULDER PAIN, LEFT (ICD-719.41) Assessment: Unchanged Filled out FMLA paperwork. Pt feels that her pain is very debilitating. advised going  back to [facility] if needed.   The following medications were removed from the medication list:    Tramadol Hcl 50 Mg Tabs (Tramadol hcl) .Marland Kitchen... Take one tab by mouth every 6 hours, always take with food. Her updated medication list for this problem includes:    Cyclobenzaprine Hcl 10 Mg Tabs (Cyclobenzaprine hcl) .Marland Kitchen... 1/2 tab to 1 tab by mouth 2 times daily as needed for pain    Ibuprofen 800 Mg Tabs (Ibuprofen) .Marland Kitchen... Take one tab by mouth every 6 hours, always take with food.  Orders: [facility]- Est  Level 4 ([address])  Complete Medication List: 1)  Lasix 20 Mg Tabs (Furosemide) .... One tab by mouth four times a week. 2)  Prilosec 20 Mg Cpdr (Omeprazole) .... Take 1 capsule by mouth once a day 3)  Xyzal 5 Mg Tabs (Levocetirizine dihydrochloride) .... Take 1 tablet by mouth at bedtime 4)  Nasacort Aq 55 Mcg/act Aers (Triamcinolone acetonide(nasal)) .... 2 sprays in each nostril daily 5)  Cyclobenzaprine Hcl 10 Mg Tabs (Cyclobenzaprine hcl) .... 1/2 tab to 1 tab by mouth 2 times daily as needed for pain 6)  Benzonatate 200 Mg Caps (Benzonatate) .Marland Kitchen.. 1 by mouth three times a day prn 7)  Ibuprofen 800 Mg Tabs (Ibuprofen) .... Take one tab by mouth every 6 hours, always take with food. 8)  Voltaren 1 % Gel (Diclofenac sodium) .... Apply 2 g to each knee qid as needed pain. 9)  Lidoderm 5 % Ptch (Lidocaine) .... Apply 1 patch to back for up to 12 hours daily as needed for pain  Patient Instructions: 1)  You call the sports medicine clinic to make an appointment if still having concerns and questions about the joint pains.  2)  I will try to set up PT for hydrotherapy.  3)  Call the local [facility] to find out if they have jacuzzi's, saunas or steam rooms since the heat helps your joints.

## 2010-09-28 NOTE — Miscellaneous (Signed)
Summary: x ray orders   Clinical Lists Changes  Orders: Added new Test order of Radiology other (Radiology Other) - Signed

## 2010-09-30 NOTE — Letter (Signed)
Summary: [facility]-SM&OC  ROI-SM&OC   Imported By: Marily Memos 08/19/2010 11:43:22  _____________________________________________________________________  External Attachment:    Type:   Image     Comment:   External Document

## 2010-09-30 NOTE — Consult Note (Signed)
Summary: SM&OC: further lab testing, started Prednisone  SM&OC   Imported By: Bradly Bienenstock 08/27/2010 12:12:42  _____________________________________________________________________  External Attachment:    Type:   Image     Comment:   External Document

## 2010-09-30 NOTE — Progress Notes (Signed)
  Phone Note Call from Patient   Caller: Patient Call For: [phone] Summary of Call: Ms. Carusone is calling with fax number to [facility]  Fax # is [phone]  claim# [phone] attn Wynonia Sours.  Office notes for today's visit. Initial call taken by: Abundio Miu,  September 15, 2010 4:28 PM  Follow-up for Phone Call        spoke with pt and informed her that i will fax her ofc note to Fax # is [phone]  claim# [phone] attn Wynonia Sours Follow-up by: Loralee Pacas CMA,  September 20, 2010 9:33 AM

## 2010-09-30 NOTE — Letter (Signed)
Summary: [facility]OC  ROI-SM&OC   Imported By: Marily Memos 09/09/2010 10:35:09  _____________________________________________________________________  External Attachment:    Type:   Image     Comment:   External Document

## 2010-09-30 NOTE — Assessment & Plan Note (Signed)
Summary: pain, HTN   Vital Signs:  Patient profile:   50 year old female Height:      65.5 inches Weight:      213 pounds Temp:     98.4 degrees F oral Pulse rate:   87 / minute Pulse rhythm:   regular BP sitting:   131 / 82  (right arm) Cuff size:   regular  Vitals Entered By: Loralee Pacas CMA (September 15, 2010 1:52 PM) CC: Pain follow-up visit Is Patient Diabetic? No   Primary Care Provider:  Jamie Brookes MD  CC:  Pain follow-up visit.  History of Present Illness: Pain: Bridget [name] is here for follow up on how she has been doing the last few weeks. She has not been having to use as much pain medicine. She has been to see the Rheumatologist (Dr. Titus Dubin) and is doing well. She had a lot of the same labs repeated at their office and her labs were the same as when we tested them. They did not definitively show RA or Lupus but the decision was made to treat the patient empirically and she is improving. She is taking Plaquenil and Prednisone and is feeling better. She is still planning to go back to work at the date preposed on her earlier paperwork. She will be going back to work full time on 10-04-10.   Habits & Providers  Alcohol-Tobacco-Diet     Tobacco Status: never     Tobacco Counseling: to remain off tobacco products     Year Quit: 2000     Passive Smoke Exposure: yes  Current Medications (verified): 1)  Lasix 20 Mg Tabs (Furosemide) .... One Tab By Mouth Four Times A Week. 2)  Prilosec 20 Mg Cpdr (Omeprazole) .... Take 1 Capsule By Mouth Once A Day 3)  Xyzal 5 Mg Tabs (Levocetirizine Dihydrochloride) .... Take 1 Tablet By Mouth At Bedtime 4)  Nasacort Aq 55 Mcg/act Aers (Triamcinolone Acetonide(Nasal)) .... 2 Sprays in Each Nostril Daily 5)  Ms Contin 15 Mg Xr12h-Tab (Morphine Sulfate) .... Take 1 Pill Every 12 Hours For Pain 6)  Methylprednisolone 4 Mg Tabs (Methylprednisolone) .... Taking 2 Tabs By Mouth Daily X 2 Weeks, Then 1 Tab X 2 Weeks. 7)  Plaquenil 200 Mg  Tabs (Hydroxychloroquine Sulfate) .... Take 1 Tab Daily  Allergies (verified): 1)  ! Codeine 2)  ! Penicillin V Potassium (Penicillin V Potassium) 3)  ! Darvocet A500 4)  ! Reglan (Metoclopramide Hcl) 5)  ! Sulfa  Review of Systems        vitals reviewed and pertinent negatives and positives seen in HPI   Physical Exam  General:  Well-developed,well-nourished,in no acute distress; alert,appropriate and cooperative throughout examination Msk:  Pt has a somewhat more normal gait.  Psych:  Cognition and judgment appear intact. Alert and cooperative with normal attention span and concentration. No apparent delusions, illusions, hallucinations, quite talkative!   Impression & Recommendations:  Problem # 1:  BACK PAIN (ICD-724.5) Assessment Improved Pt has several areas of pain that are improving with her treatment of PRednisone and Plaquenil. She has about 2 more weeks of prednisone taper left. She is to follow up with Dr. Titus Dubin if her pain returns after being off the prednisone.   Her updated medication list for this problem includes:    Ms Contin 15 Mg Xr12h-tab (Morphine sulfate) .Marland Kitchen... Take 1 pill every 12 hours for pain  Orders: [facility]- Est Level  3 (04540)  Problem # 2:  HYPERTENSION, BENIGN SYSTEMIC (  ICD-401.1) Assessment: Deteriorated  Pt's BP is a little high today. Encouraged to keep taking med and monitor it at the store from time to time. Will increase or start new med next time if her BP is still elevated.   Her updated medication list for this problem includes:    Lasix 20 Mg Tabs (Furosemide) ..... One tab by mouth four times a week.  Orders: [facility]- Est Level  3 ([address])  Complete Medication List: 1)  Lasix 20 Mg Tabs (Furosemide) .... One tab by mouth four times a week. 2)  Prilosec 20 Mg Cpdr (Omeprazole) .... Take 1 capsule by mouth once a day 3)  Xyzal 5 Mg Tabs (Levocetirizine dihydrochloride) .... Take 1 tablet by mouth at bedtime 4)  Nasacort Aq 55  Mcg/act Aers (Triamcinolone acetonide(nasal)) .... 2 sprays in each nostril daily 5)  Ms Contin 15 Mg Xr12h-tab (Morphine sulfate) .... Take 1 pill every 12 hours for pain 6)  Methylprednisolone 4 Mg Tabs (Methylprednisolone) .... Taking 2 tabs by mouth daily x 2 weeks, then 1 tab x 2 weeks. 7)  Plaquenil 200 Mg Tabs (Hydroxychloroquine sulfate) .... Take 1 tab daily  Other Orders: Pneumococcal Vaccine (60454) Admin 1st Vaccine (09811)  Patient Instructions: 1)  It was nice to see you today. 2)  I'm glad you are doing well.  3)  If you start to feel bad again before it is your 3 month visit with Rheumatology call them sooner to make an appointment.  4)  Your blood pressure is a little high today, please check it occasionally at [facility] or at your pharmacy to monitor it.  5)  You are getting your Pneumovax vaccine today.  6)  Call us with the phone/fax number of your [facility] group.    Orders Added: 1)  Pneumococcal Vaccine [90732] 2)  Admin 1st Vaccine [90471] 3)  [facility]- Est Level  3 [91478]   Immunizations Administered:  Pneumonia Vaccine:    Vaccine Type: Pneumovax    Site: left deltoid    Mfr: Merck    Dose: 0.5 ml    Route: IM    Given by: Jimmy Footman, CMA    Exp. Date: 01/21/2012    Lot #: [id number]    VIS given: 08/03/09 version given September 15, 2010.   Immunizations Administered:  Pneumonia Vaccine:    Vaccine Type: Pneumovax    Site: left deltoid    Mfr: Merck    Dose: 0.5 ml    Route: IM    Given by: Jimmy Footman, CMA    Exp. Date: 01/21/2012    Lot #: [id number]    VIS given: 08/03/09 version given September 15, 2010.

## 2010-09-30 NOTE — Letter (Signed)
Summary: Out of Work  [facility]  [address]   [place], [place] [address]   Phone: [phone]  Fax: [phone]    September 22, 2010   Employee:  NAILYN DEARINGER    To Whom It May Concern:   This patient may return to work at her part time job effective as of:  Start:   Sep 22, 2010   If you need additional information, please feel free to contact our office.         Sincerely,    Jamie Brookes MD

## 2010-09-30 NOTE — Progress Notes (Signed)
Summary: ROI  ROI   Imported By: De Nurse 09/09/2010 16:39:24  _____________________________________________________________________  External Attachment:    Type:   Image     Comment:   External Document

## 2010-10-04 ENCOUNTER — Encounter: Payer: Self-pay | Admitting: Family Medicine

## 2010-10-04 ENCOUNTER — Telehealth: Payer: Self-pay | Admitting: Family Medicine

## 2010-10-06 NOTE — Progress Notes (Addendum)
Summary: return to part-time work.   Phone Note Call from Patient   Caller: Patient Call For: [phone] Summary of Call: Need note stating pat can go back to work at part time job.  Call when ready for pickup Initial call taken by: Abundio Miu,  September 22, 2010 8:34 AM  Follow-up for Phone Call        done. Please let the patient know she can come pick it up this afternoon.  Follow-up by: Jamie Brookes MD,  September 22, 2010 11:24 AM  Additional Follow-up for Phone Call Additional follow up Details #1::        pt informed Additional Follow-up by: De Nurse,  September 27, 2010 9:13 AM

## 2010-10-06 NOTE — Progress Notes (Signed)
----   Converted from flag ---- ---- 09/26/2010 11:39 PM, Jamie Brookes MD wrote: Can you please request records from the Rheumatologist who is seeing this patient. I don't have any record of who she is seeing and what their plan is. Thanks. ------------------------------   Phone Note Outgoing Call Call back at  [phone]   Call placed by: Loralee Pacas CMA,  September 28, 2010 9:19 AM Call placed to: Specialist Summary of Call: called pt to confirm rheumatology dr. Corliss Skains [phone]  spoke with Irving Burton and she will fax over information.Marland Kitchen

## 2010-10-14 NOTE — Progress Notes (Signed)
Summary: needs note  Phone Note Call from Patient Call back at Home Phone [phone]   Caller: Patient Summary of Call: needs a return to work note to give to full time job- Bridget [name] needs asap to be faxed to [employer] - ph # [phone] (can't find fax#) Initial call taken by: De Nurse,  October 04, 2010 8:35 AM  Follow-up for Phone Call        [facility] and got her fax number. Put signed letter in the to be faxed box.  Follow-up by: Jamie Brookes MD,  October 04, 2010 8:52 AM

## 2010-10-14 NOTE — Letter (Signed)
Summary: Out of Work  [facility]  [address]   [place], [place] [address]   Phone: [phone]  Fax: [phone]    October 04, 2010   Employee:  DEMITA TOBIA    To Whom It May Concern:   For Medical reasons, please accept the above named employee back to workas of the following date:  Oct 04, 2010    If you need additional information, please feel free to contact our office.         Sincerely,    Jamie Brookes MD

## 2010-10-21 ENCOUNTER — Telehealth: Payer: Self-pay | Admitting: Family Medicine

## 2010-10-21 NOTE — Telephone Encounter (Signed)
Please let this patient know that I will not be back in the office until Tuesday as I am on vacation. Her papers will likely not be ready until Wednesday. Thanks.

## 2010-10-21 NOTE — Telephone Encounter (Signed)
[employer] Payment form placed in Dr. Lorelee Market box for completion.  Bridget [name]

## 2010-10-21 NOTE — Telephone Encounter (Signed)
Patient dropped off form to filled out Insurance co.  Please call when completed.

## 2010-10-21 NOTE — Telephone Encounter (Signed)
Patient Notified.Bridget [name], Bridget [name]

## 2010-11-14 LAB — COMPREHENSIVE METABOLIC PANEL
ALT: 23 U/L (ref 0–35)
Alkaline Phosphatase: 74 U/L (ref 39–117)
BUN: 12 mg/dL (ref 6–23)
CO2: 25 mEq/L (ref 19–32)
Chloride: 104 mEq/L (ref 96–112)
GFR calc non Af Amer: >60 mL/min (ref >60)
Glucose, Bld: 161 mg/dL — ABNORMAL HIGH (ref 70–99)
Potassium: 3.7 mEq/L (ref 3.5–5.1)
Sodium: 135 mEq/L (ref 135–145)
Total Bilirubin: 0.6 mg/dL (ref 0.3–1.2)

## 2010-11-14 LAB — GLUCOSE, CAPILLARY

## 2010-11-14 LAB — DIFFERENTIAL
Basophils Absolute: 0.1 10*3/uL (ref 0.0–0.1)
Eosinophils Relative: 5 % (ref 0–5)
Lymphocytes Relative: 27 % (ref 12–46)
Monocytes Absolute: 0.5 10*3/uL (ref 0.1–1.0)

## 2010-11-14 LAB — CBC
HCT: 35.4 % — ABNORMAL LOW (ref 36.0–46.0)
Hemoglobin: 12.1 g/dL (ref 12.0–15.0)
RBC: 4.07 MIL/uL (ref 3.87–5.11)
RDW: 13.4 % (ref 11.5–15.5)
WBC: 6.7 10*3/uL (ref 4.0–10.5)

## 2010-11-23 ENCOUNTER — Telehealth: Payer: Self-pay | Admitting: Family Medicine

## 2010-11-23 NOTE — Telephone Encounter (Signed)
Needs refill on Morphine Please call when ready

## 2010-11-25 ENCOUNTER — Telehealth: Payer: Self-pay | Admitting: Family Medicine

## 2010-11-25 ENCOUNTER — Telehealth: Payer: Self-pay | Admitting: *Deleted

## 2010-11-25 MED ORDER — MORPHINE SULFATE CR 15 MG PO TB12: 15.0000 mg | ORAL_TABLET | Freq: Two times a day (BID) | ORAL | Status: DC

## 2010-11-25 NOTE — Telephone Encounter (Signed)
Please let the patient know that the Rx is ready for pick up.

## 2010-11-25 NOTE — Telephone Encounter (Signed)
LVM for patient to call back to inform of rx up front ready to be picked up

## 2010-11-25 NOTE — Telephone Encounter (Signed)
LVM for patient to call back to inform of rx ready to be picked up

## 2010-11-29 ENCOUNTER — Telehealth: Payer: Self-pay | Admitting: Family Medicine

## 2010-11-29 NOTE — Telephone Encounter (Signed)
Patient is taking Plaquenil and is having burning on the top of her feet.  She wonders if this could be a side effect of medication.  Patient would like you to give her a call.  She is still in a lot of pain.

## 2010-11-29 NOTE — Telephone Encounter (Signed)
Pt have been taking tylenol arthritis when she is working so that she will not fall asleep at work.  Taking the morphine when she gets home.  Because the tylenol is not as strong as the morphine, pt is working in pain.  Wanted to know if she could take something else for the pain that will not make her drowsy at work.

## 2010-11-29 NOTE — Telephone Encounter (Signed)
Called patient at her home number and left a message. I don't know of any problems with foot burning but there are some exfoliative dermatitis so I advised the patient to let the provider who prescribes that medication know if she is having any swelling, redness or pealing skin on her feet.   As far as the pain medicine goes, she is on Morphine extended release 15 mg BID. If she needs something for breakthrough it would be reasonable to either increase the dose or give a medication for breakthrough.  I advised her to call back with the concern.

## 2010-11-30 ENCOUNTER — Other Ambulatory Visit: Payer: Self-pay | Admitting: Family Medicine

## 2010-11-30 MED ORDER — MELOXICAM 15 MG PO TABS: 15.0000 mg | ORAL_TABLET | Freq: Every day | ORAL | Status: DC

## 2010-11-30 NOTE — Telephone Encounter (Signed)
I put a Rx for Mobic for her at the front desk to pick up. She doesn't have a pharmacy listed.  Please let her know that I did this so she can have this medication while I am gone becaue I won't be back until Tuesday.  Thanks.  Keyontay Stolz

## 2010-11-30 NOTE — Telephone Encounter (Signed)
Pt could take Mobic once daily for the pain. I will send in an Rx if that is ok with her.  There are no narcotics that do not make people sleepy so we will have to stay away from those during the day. But this is a good option for her. If she says that is Ok I will send it in for her.  Thanks.

## 2010-12-05 LAB — CULTURE, ROUTINE-ABSCESS

## 2010-12-07 ENCOUNTER — Encounter: Payer: Self-pay | Admitting: Family Medicine

## 2010-12-07 ENCOUNTER — Ambulatory Visit (INDEPENDENT_AMBULATORY_CARE_PROVIDER_SITE_OTHER): Payer: 59 | Admitting: Family Medicine

## 2010-12-07 VITALS — BP 132/80 | HR 65 | Temp 98.1°F | Ht 66.0 in | Wt 228.4 lb

## 2010-12-07 DIAGNOSIS — N760 Acute vaginitis: Secondary | ICD-10-CM

## 2010-12-07 DIAGNOSIS — B373 Candidiasis of vulva and vagina: Secondary | ICD-10-CM

## 2010-12-07 DIAGNOSIS — B9689 Other specified bacterial agents as the cause of diseases classified elsewhere: Secondary | ICD-10-CM

## 2010-12-07 DIAGNOSIS — A499 Bacterial infection, unspecified: Secondary | ICD-10-CM

## 2010-12-07 DIAGNOSIS — L293 Anogenital pruritus, unspecified: Secondary | ICD-10-CM

## 2010-12-07 DIAGNOSIS — N898 Other specified noninflammatory disorders of vagina: Secondary | ICD-10-CM

## 2010-12-07 DIAGNOSIS — IMO0001 Reserved for inherently not codable concepts without codable children: Secondary | ICD-10-CM

## 2010-12-07 HISTORY — DX: Other specified bacterial agents as the cause of diseases classified elsewhere: B96.89

## 2010-12-07 LAB — POCT WET PREP (WET MOUNT)

## 2010-12-07 LAB — POCT GLYCOSYLATED HEMOGLOBIN (HGB A1C): Hemoglobin A1C: 10.3

## 2010-12-07 MED ORDER — GLUCOSE BLOOD VI KIT: 1.0000 | PACK | Status: DC

## 2010-12-07 MED ORDER — FLUCONAZOLE 150 MG PO TABS: 150.0000 mg | ORAL_TABLET | Freq: Once | ORAL | Status: DC

## 2010-12-07 MED ORDER — METFORMIN HCL 500 MG PO TABS: 500.0000 mg | ORAL_TABLET | Freq: Two times a day (BID) | ORAL | Status: DC

## 2010-12-07 MED ORDER — GABAPENTIN 100 MG PO TABS: 100.0000 mg | ORAL_TABLET | Freq: Three times a day (TID) | ORAL | Status: DC

## 2010-12-07 MED ORDER — SITAGLIPTIN PHOSPHATE 100 MG PO TABS: 100.0000 mg | ORAL_TABLET | Freq: Every day | ORAL | Status: DC

## 2010-12-07 MED ORDER — METRONIDAZOLE 500 MG PO TABS: 500.0000 mg | ORAL_TABLET | Freq: Two times a day (BID) | ORAL | Status: AC

## 2010-12-07 MED ORDER — HYDROXYCHLOROQUINE SULFATE 200 MG PO TABS: 200.0000 mg | ORAL_TABLET | Freq: Every day | ORAL | Status: DC

## 2010-12-07 NOTE — Patient Instructions (Addendum)
You A1c is 10.3 which is very high. Your diabetes is considered out of control and you need to restart your medicines.  This is likely the reason your feet are burning and being on the diabetes medicine may resolve the burning all by itself.  You appear to have a yeast infection. I have sent in Diflucan to your pharmacy.

## 2010-12-08 ENCOUNTER — Telehealth: Payer: Self-pay | Admitting: *Deleted

## 2010-12-08 DIAGNOSIS — B373 Candidiasis of vulva and vagina: Secondary | ICD-10-CM

## 2010-12-08 DIAGNOSIS — B3731 Acute candidiasis of vulva and vagina: Secondary | ICD-10-CM

## 2010-12-08 HISTORY — DX: Acute candidiasis of vulva and vagina: B37.31

## 2010-12-08 HISTORY — DX: Candidiasis of vulva and vagina: B37.3

## 2010-12-08 NOTE — Telephone Encounter (Signed)
LVM for patient to call back to inform of below results 

## 2010-12-08 NOTE — Telephone Encounter (Signed)
Message copied by Jimmy Footman on Wed Dec 08, 2010  9:49 AM ------      Message from: Clotilde Dieter, [place]      Created: Tue Dec 07, 2010  5:43 PM      Regarding: please call the patient       Please call this patient and let her know that she has a yeast infection and bacterial vaginosis and I want her to take the Metronidazole as well. I have sent it into [facility] pharmacy.       Thanks.             ----- Message -----         From: Bonnie Swaziland         Sent: 12/07/2010   9:09 AM           To: Jamie Brookes

## 2010-12-08 NOTE — Progress Notes (Signed)
Diabetes: Pt had her diabetes under good control in 2010 and was taken off medication at that time. Since then she has not been taking care of herself as well and admits to eating food that she knows is bad for her and not exercising due to her joint pains. She does not have a meter anymore, she is not checking her glucose, she feels like she has to urinate all the time, her mouth feels dry and he feels like she is getting a yeast infection. She knows her blood sugar is high and is ready to start doing something about it again. She is not currently taking any of her DM meds. Her feet have been burning lately (a different type of pain from her joint pains).   Vaginal itching: PT has been having a d/c and a lot of itching the last few days. She thinks she has a yeast infection because she has a lot of thick cheesy discharge. She has had yeast infections before.   ROS: neg fevers, + foot pain, + feet burning, + joint pains in multiple large joints.  PE:  Gen: NAD, talkative and interactive HEENT: [place]/AT, PERRL, EOMI CV: RRR, no murmur Pulm: CTAB, no wheezes GU: copious white curdish discharge, some erythema in vagina Ext: no abnormalities in her feet

## 2010-12-08 NOTE — Assessment & Plan Note (Addendum)
PT's A1c was 10.3 She needs to get back on her medications.  Plan to restart Januvia and restart testing. . She had success with it last time.  She can not take metformin. She has tried it in the past and has significant reported GI upset even with trying to take 1/2 a pill.  Pt will need repeat A1c in 3 months.  Diabetic teaching classes ordered.

## 2010-12-08 NOTE — Assessment & Plan Note (Signed)
Pt has yeast infection seen on vaginal exam.   I suspect this infection has gotten worse because of her elevated glucose. Advised to get back on regimen.  Plan to treat with Diflucan.

## 2010-12-08 NOTE — Telephone Encounter (Signed)
Spoke with patient and informed.

## 2010-12-08 NOTE — Assessment & Plan Note (Signed)
WEt prep revealed signs of BV as well as yeast infection. Will treat with Metronidazole

## 2011-01-25 ENCOUNTER — Other Ambulatory Visit: Payer: Self-pay | Admitting: Family Medicine

## 2011-01-25 ENCOUNTER — Encounter: Payer: 59 | Attending: Family Medicine | Admitting: Dietician

## 2011-01-25 ENCOUNTER — Telehealth: Payer: Self-pay | Admitting: Family Medicine

## 2011-01-25 DIAGNOSIS — E119 Type 2 diabetes mellitus without complications: Secondary | ICD-10-CM | POA: Insufficient documentation

## 2011-01-25 DIAGNOSIS — I10 Essential (primary) hypertension: Secondary | ICD-10-CM | POA: Insufficient documentation

## 2011-01-25 DIAGNOSIS — IMO0001 Reserved for inherently not codable concepts without codable children: Secondary | ICD-10-CM

## 2011-01-25 DIAGNOSIS — Z713 Dietary counseling and surveillance: Secondary | ICD-10-CM | POA: Insufficient documentation

## 2011-01-25 MED ORDER — GABAPENTIN 100 MG PO TABS: 300.0000 mg | ORAL_TABLET | Freq: Three times a day (TID) | ORAL | Status: DC

## 2011-01-25 NOTE — Telephone Encounter (Signed)
Saw health coach last week and was suggested to increase the Gabapentin - coach was supposed to be sending recommendation to her, but she is still in pain and needs it increased.  [facility]

## 2011-01-25 NOTE — Telephone Encounter (Signed)
Please let this patient know that her new dose of Gabapentin has been sent in to the pharamcy. Thanks.

## 2011-01-27 ENCOUNTER — Other Ambulatory Visit: Payer: Self-pay | Admitting: Family Medicine

## 2011-01-27 MED ORDER — FREESTYLE SYSTEM KIT: 1.0000 | PACK | Status: DC | PRN

## 2011-01-27 MED ORDER — ALCOHOL PREP PAD 70 % PADS: 1.0000 "application " | MEDICATED_PAD | Freq: Every day | Status: DC

## 2011-01-27 MED ORDER — GLUCOSE BLOOD VI STRP: ORAL_STRIP | Status: DC

## 2011-01-27 MED ORDER — TRUEPLUS LANCETS 33G MISC: 100.0000 | Freq: Every day | Status: DC

## 2011-02-07 ENCOUNTER — Other Ambulatory Visit: Payer: Self-pay | Admitting: Family Medicine

## 2011-02-07 NOTE — Telephone Encounter (Signed)
Refill request

## 2011-02-08 NOTE — Telephone Encounter (Signed)
Refill request for Dr. Lorelee Market patient

## 2011-02-11 ENCOUNTER — Inpatient Hospital Stay (INDEPENDENT_AMBULATORY_CARE_PROVIDER_SITE_OTHER)
Admission: RE | Admit: 2011-02-11 | Discharge: 2011-02-11 | Disposition: A | Discharge status: Home / Self Care | Payer: 59 | Source: Ambulatory Visit | Attending: Family Medicine | Admitting: Family Medicine

## 2011-02-11 DIAGNOSIS — L97509 Non-pressure chronic ulcer of other part of unspecified foot with unspecified severity: Secondary | ICD-10-CM

## 2011-02-11 DIAGNOSIS — E1169 Type 2 diabetes mellitus with other specified complication: Secondary | ICD-10-CM

## 2011-02-16 ENCOUNTER — Ambulatory Visit (INDEPENDENT_AMBULATORY_CARE_PROVIDER_SITE_OTHER): Payer: 59 | Admitting: Family Medicine

## 2011-02-16 DIAGNOSIS — S91009A Unspecified open wound, unspecified ankle, initial encounter: Secondary | ICD-10-CM | POA: Insufficient documentation

## 2011-02-16 DIAGNOSIS — IMO0002 Reserved for concepts with insufficient information to code with codable children: Secondary | ICD-10-CM

## 2011-02-16 MED ORDER — GABAPENTIN 300 MG PO CAPS: 300.0000 mg | ORAL_CAPSULE | Freq: Three times a day (TID) | ORAL | Status: DC

## 2011-02-16 NOTE — Patient Instructions (Signed)
Stay off the foot for the next 3 days (Today, Thursday and Friday) and then come back to see me on Monday.  Continue the full course of antibiotics.  Continue using the ointment.  I refilled your Gabapentin.

## 2011-02-16 NOTE — Progress Notes (Signed)
Rt ankle wound: Pt has a Rt ankle wound that first developed as a blister about 2 weeks ago. The blister popped and drained and then the wound did not heal well. She was seen at Urgent Care on Friday (5 days ago) and was given Mupirocin to use on the wound and the wound was dressed. She comes in today because she is having a hard time walking at her job with her tennis shoes that she is suppose to wear. It is rubbing the wound and causing her pain. She is off this weekend but is suppose to work the next 3 days.   ROS: pain in her Rt ankle. Other joint pains that are not new.   PE:  Skin: Rt ankle has a 1.1 cm wound with contractions around the skin. It appears to be healing well.

## 2011-02-16 NOTE — Assessment & Plan Note (Signed)
Pt has a 1.1 cm shallow superficial ulcer on the lateral side of her Rt ankle. It is rubbing her shoe so I have advised to stay off of it until Monday, elevate the foot, keep putting the Mupirocin on it and come back to see me on Monday.  I suspect she will be even more improved on Monday and will return to work.

## 2011-02-21 ENCOUNTER — Encounter: Payer: Self-pay | Admitting: Family Medicine

## 2011-02-21 ENCOUNTER — Ambulatory Visit (INDEPENDENT_AMBULATORY_CARE_PROVIDER_SITE_OTHER): Payer: 59 | Admitting: Family Medicine

## 2011-02-21 VITALS — BP 129/81 | HR 87 | Temp 97.8°F | Ht 65.0 in | Wt 226.0 lb

## 2011-02-21 DIAGNOSIS — S91009A Unspecified open wound, unspecified ankle, initial encounter: Secondary | ICD-10-CM

## 2011-02-21 NOTE — Progress Notes (Signed)
Wound recheck: Pt has a Rt ankle lateral skin wound. It is a shallow wound with healing skin in the center. It has contracted skin around it. She is keeping is covered and Mupirocin on it. She has been wearing open toed shoes. She is not walking on it a lot but has walked on it some. She has some swelling around the ankle. The wound is still about 1.1 cm in diameter.   ROS: neg except swelling and pain with above wound  PE:  Gen: pt has some discomfort. Skin: Pt has a shallow 1.1 cm wound on the Rt ankle with contracted skin around it. It appears to be healing without erythema

## 2011-02-21 NOTE — Assessment & Plan Note (Signed)
Still 1.1 cm in diameter.  Pt applying mupirocin regularly.  She will RTC in 1 week for a recheck.  She can return to work as long as she wears an open shoe on the Rt so it does not rub.

## 2011-02-21 NOTE — Patient Instructions (Signed)
Wear an open shoe until the wound is healed. Come back next week to have the wound evaluated.

## 2011-02-24 ENCOUNTER — Telehealth: Payer: Self-pay | Admitting: Family Medicine

## 2011-02-24 NOTE — Telephone Encounter (Signed)
Will route to notable behaviors in basket.

## 2011-02-24 NOTE — Telephone Encounter (Signed)
Pt called very upset about the fact that when she was in here on Monday and was told to come back in a week, she found out at the front desk that Dr Clotilde Dieter will not be here any longer and she was assigned to Dr Rivka Safer.  She was yelling the fact that she did not want Dr Rivka Safer because she didn't like him when she was assigned to him before and "didn't anyone talk to the other person to find these things out before she got reassigned AGAIN!"  I tried to talk to her, but she kept saying "don't interrupt me" and kept on telling me the same thing and how she worked at [employer] and she KNOWS how things work and this is not how you treat people.   She also went on to say that her daughter tried to get her son in for a sick appointment today and that we sent her to [facility] and that we couldn't see her.  "I talked with Dr Edmonia James last night as she was leaving the hospital and told her about the grandson and she told me that she could see her or someone else could tomorrow and then don't understand how you can send them away like that when the doctor told me that he could be seen"   I listened to her rant for several minutes before I could speak.  I was able to diffuse her by telling her that we at the [facility] try to do everything we can to serve our patients in every way.  That the daughter didn't call for an appt for her son until 1:30 and by that time there were no appts left for the afternoon.  If she had called earlier, then it was possible to get him in today.  I told her that I was glad that the front office was able to accommodate her by reassigning her to a female doctor and get her in on Monday for her f/u.  By the time I hung up, she was OK and thanking me for listening to her.  It still shook me up a lot to listen to her tirade but am glad that she heard me. Darci Needle

## 2011-02-28 ENCOUNTER — Encounter: Payer: Self-pay | Admitting: Family Medicine

## 2011-02-28 ENCOUNTER — Ambulatory Visit (INDEPENDENT_AMBULATORY_CARE_PROVIDER_SITE_OTHER): Payer: 59 | Admitting: Family Medicine

## 2011-02-28 VITALS — BP 131/83 | HR 96 | Ht 65.0 in | Wt 225.7 lb

## 2011-02-28 DIAGNOSIS — S91009A Unspecified open wound, unspecified ankle, initial encounter: Secondary | ICD-10-CM

## 2011-02-28 DIAGNOSIS — L97309 Non-pressure chronic ulcer of unspecified ankle with unspecified severity: Secondary | ICD-10-CM

## 2011-02-28 DIAGNOSIS — E1169 Type 2 diabetes mellitus with other specified complication: Secondary | ICD-10-CM

## 2011-02-28 DIAGNOSIS — E11621 Type 2 diabetes mellitus with foot ulcer: Secondary | ICD-10-CM

## 2011-02-28 NOTE — Patient Instructions (Addendum)
It was nice to meet you today. I will refer you to the Wound Clinic. Someone from Wound Clinic will call you with a time and date. Continue to take OTC pain medications for the next 10-14 days. Return to clinic or call MD if symptoms worsen or if you start having fevers, chills, nausea/vomiting. Thanks for your time.

## 2011-03-01 NOTE — Progress Notes (Signed)
  Subjective:    Patient ID: Bridget [name], female    DOB: [birth year], 50 y.o.   MRN: [id number]  HPI Pt returns to clinic to recheck Rt ankle lateral skin wound.  It remains shallow with healing skin in the center. It has dark, contracted skin around it.  She is keeping is covered and Mupirocin on it. She has been wearing open toed shoes.  Still c/o constant pain located at lesion and radiating up R lower extremity.  Patient does have hx of neuropathy.  Has been taking OTC NSAIDS with little relief.  Has MS Contin at home, but only takes on the weekends because they make her drowsy.  Patient wants a referral to wound clinic for possible debridement, etc.  Review of Systems Endorses bilateral ankle/foot pain and tingling of bil lower extremities.  Denies fever, chills, SOB, night sweats.  Denies CP, abdominal pain, nausea/vomiting, changes in vision.    Objective:   Physical Exam  Constitutional: She appears well-developed and well-nourished. No distress.  Cardiovascular: Intact distal pulses.   Musculoskeletal: Normal range of motion. She exhibits tenderness. She exhibits no edema.       Rt ankle lateral wound, shallow.  Healing skin in center, but no significant change from previous exam.  No erythema.  Granular tissue in center, but no drainage or pus.   Skin:       See MSK          Assessment & Plan:

## 2011-03-01 NOTE — Assessment & Plan Note (Addendum)
Appears to be decreasing in size, but center of ulcer is not healing as expected. Continue with mupirocin, dressing, and wear open shoes. Will refer to wound care clinic, will likely be seen early next week. May need sharp debridement and/or imaging. A1c today is 8.4.  No signs of infection yet.  Red flags reviewed. Patient to be seen by wound clinic, then RTC in 4 weeks or prn. She agreed/understood plan.

## 2011-03-10 ENCOUNTER — Encounter (HOSPITAL_BASED_OUTPATIENT_CLINIC_OR_DEPARTMENT_OTHER): Payer: 59 | Attending: Internal Medicine

## 2011-03-10 DIAGNOSIS — E1169 Type 2 diabetes mellitus with other specified complication: Secondary | ICD-10-CM | POA: Insufficient documentation

## 2011-03-10 DIAGNOSIS — E1149 Type 2 diabetes mellitus with other diabetic neurological complication: Secondary | ICD-10-CM | POA: Insufficient documentation

## 2011-03-10 DIAGNOSIS — E1142 Type 2 diabetes mellitus with diabetic polyneuropathy: Secondary | ICD-10-CM | POA: Insufficient documentation

## 2011-03-10 DIAGNOSIS — M329 Systemic lupus erythematosus, unspecified: Secondary | ICD-10-CM | POA: Insufficient documentation

## 2011-03-10 DIAGNOSIS — Z90710 Acquired absence of both cervix and uterus: Secondary | ICD-10-CM | POA: Insufficient documentation

## 2011-03-10 DIAGNOSIS — K219 Gastro-esophageal reflux disease without esophagitis: Secondary | ICD-10-CM | POA: Insufficient documentation

## 2011-03-10 DIAGNOSIS — L97309 Non-pressure chronic ulcer of unspecified ankle with unspecified severity: Secondary | ICD-10-CM | POA: Insufficient documentation

## 2011-03-10 DIAGNOSIS — I10 Essential (primary) hypertension: Secondary | ICD-10-CM | POA: Insufficient documentation

## 2011-03-10 DIAGNOSIS — Z79899 Other long term (current) drug therapy: Secondary | ICD-10-CM | POA: Insufficient documentation

## 2011-03-11 ENCOUNTER — Other Ambulatory Visit (HOSPITAL_COMMUNITY): Payer: Self-pay | Admitting: Obstetrics & Gynecology

## 2011-03-11 ENCOUNTER — Ambulatory Visit (HOSPITAL_COMMUNITY)
Admission: RE | Admit: 2011-03-11 | Discharge: 2011-03-11 | Disposition: A | Discharge status: Home / Self Care | Payer: 59 | Source: Ambulatory Visit | Attending: Obstetrics & Gynecology | Admitting: Obstetrics & Gynecology

## 2011-03-11 DIAGNOSIS — R52 Pain, unspecified: Secondary | ICD-10-CM

## 2011-03-11 DIAGNOSIS — Z01818 Encounter for other preprocedural examination: Secondary | ICD-10-CM | POA: Insufficient documentation

## 2011-03-11 DIAGNOSIS — E1169 Type 2 diabetes mellitus with other specified complication: Secondary | ICD-10-CM | POA: Insufficient documentation

## 2011-03-11 DIAGNOSIS — M19079 Primary osteoarthritis, unspecified ankle and foot: Secondary | ICD-10-CM | POA: Insufficient documentation

## 2011-03-11 DIAGNOSIS — L97509 Non-pressure chronic ulcer of other part of unspecified foot with unspecified severity: Secondary | ICD-10-CM | POA: Insufficient documentation

## 2011-03-11 NOTE — Progress Notes (Unsigned)
[facility]  NAME:  Bridget [name], BERHOW              ACCOUNT NO.:  [id number]  MEDICAL RECORD NO.:  [id number]      DATE OF BIRTH:  1961/02/25  PHYSICIAN:  Maxwell Caul, M.D. VISIT DATE:  03/10/2011                                  OFFICE VISIT   HISTORY:  Ms. Echavarria is here for a review of a wound on the right lateral foot.  This wound is of uncertain etiology, but started in early June as a blister.  This progressed over the next week and half and became painful.  She was seen at an urgent care and referred back to her primary doctor.  Somewhere in here, she was prescribed doxycycline and Bactrim, although I am not sure that a culture was actually done.  She completed the antibiotics on February 21, 2011  The blister opened and she has been left with a small open area.  She has no prior history of wounds on her feet.  She is a type 2 diabetic since 2000.  She states she has a history of neuropathy, but no vascular issues that she is aware of.  PAST MEDICAL HISTORY: 1. Hypertension. 2. Diabetic neuropathy type 2. 3. History of lupus. 4. Cataract surgery. 5. Bladder suspension surgery. 6. Hysterectomy. 7. Gastroesophageal reflux disease.  MEDICATIONS:  She is on hydroxychloroquine, gabapentin, Diflucan p.r.n., Lasix 20 mg 4 times a week, Xyzal 5 mg at bedtime, MS Contin 15 twice a day as needed, Prilosec 20 daily, and Bactroban 2% which she has been using to dress the wounds.  REVIEW OF SYSTEMS:  RESPIRATORY:  There is no cough, no shortness of breath.  CARDIAC:  No chest pain.  GI:  No history of diarrhea.  PHYSICAL EXAMINATION:  VITAL SIGNS:  Temperature is 98.7, pulse 78, respirations 19, and blood pressure 125/84.  CBG is 150. RESPIRATORY:  Clear air entry bilaterally. CARDIAC:  Heart sounds are normal.  There are no murmurs. EXTREMITIES:  Circulation, her peripheral pulses are bounding in her feet.  Her ABIs calculated in this clinic, however, were  over 1.2. Nevertheless, her capillary refill time is normal.  Her feet are warm. There is certainly no obvious ischemia.  Wound exam on the right lateral ankle measures 1.1 x 1.3 x 0.1.  This had healthy granulation.  What concerned me was some surrounding discoloration which projected posteriorly towards the Achilles tendon. There was tenderness in this area, which may be initially I think there might be cellulitis here, however, the patient has similar discoloration and tenderness on the similar region on the other side.  Nevertheless, I felt there was enough here to warrant a course of antibiotics and I have gone ahead and prescribed Levaquin 500 mg 1 p.o. daily for 7 days and I will review this next week.  IMPRESSION:  Wagner's grade 2 wound, right lateral ankle.  We prescribed Levaquin as noted above.  Dress the wound with silver collagen and we will review this in 1 week's time.          ______________________________ Maxwell Caul, M.D.     MGR/MEDQ  D:  03/10/2011  T:  03/11/2011  Job:  [id number]

## 2011-03-24 ENCOUNTER — Encounter: Payer: 59 | Attending: Family Medicine | Admitting: Dietician

## 2011-03-24 DIAGNOSIS — I10 Essential (primary) hypertension: Secondary | ICD-10-CM | POA: Insufficient documentation

## 2011-03-24 DIAGNOSIS — Z713 Dietary counseling and surveillance: Secondary | ICD-10-CM | POA: Insufficient documentation

## 2011-03-24 DIAGNOSIS — E119 Type 2 diabetes mellitus without complications: Secondary | ICD-10-CM | POA: Insufficient documentation

## 2011-03-24 NOTE — Patient Instructions (Addendum)
   As your foot heals, daily check your feet.  If you have any issues; redness, swelling, white spots, you need to get with your MD.   Canned fruit, continue to pour off juice.  Continue to see Miss Marylu Lund on regular basis.  Continue to take your glucose and aim to get back into the 1110-120-130 range for fasting.  Continue to monitor food and try to limit portions of the rice and pasta.  Keep at 1 cup of these.  Follow-up at the end of September.

## 2011-03-24 NOTE — Progress Notes (Signed)
  Medical Nutrition Therapy:  Appt start time: 1630 end time:  1715  Assessment:  Primary concerns today: Glucose control and wound healing.. Area on rt. Lateral ankle has been infected and she [facility] June [facility].   Blood Glucose Levels: Works 11 PM -7 AM and will reverse her schedule during her recovery time.  Currently 8 hours after the evening meal is running 142-117 mg.  Numbers have been in the higher ranges 170's when the infection was greater.  MEDICATIONS: Review completed.  Not currently on antibiotics.  DIETARY INTAKE:Doing 6-7 small meals per day  11-7 shift 24-hr recall:  B (2-3 AM):  Lunch: Brown rice or whole wheat pasta.  May have spaghetti (meat sauce) or cube steak with gravy, brown rice, green beans, Homemade vegetable beef soup 11/2 cups with 1/2 cup fruit cocktail using diet drink.  Snk (6-7 AM) :apple or SF jello with fruit.  L (8-8:30 PM):  [place] sausage on 1 slice whole wheat toast or deviled egg with 2 slices toast, or instant grits and eggs and maybe a sausage.  Snk (1:00 PM): Snack or lunch.  Sandwich of roast [place] on whole wheat (2 slices) plus popcorn or baked potato chips and crystal light. D (5:00 PM):  Dinner: Vegetable beef soup 11/2 cup and crystal light, or coffee with Splenda and creamer.  Snk (10:00 PM): Drink a cup of coffee on the way to work.  Will eat at about 2:00 AM.  Recent physical activity: Very limited with the RT foot ulcer.  Estimated energy needs:1300-1400 calories/day 145-150 g carbohydrates 100-105 g protein 35-37 g fat  Progress Towards Goal(s):  In progress.   Nutritional Diagnosis:  Bridget [name]-2.1 Inpaired nutrition utilization As related to glucose.  As evidenced by HgA1C of 11.1 and diagnosis of DM 2.    Intervention:  Nutrition:  Continue to monitor glucose, portions, carb intake and sweet intake.    Monitoring/Evaluation:  Dietary intake, exercise, blood glucose level, and body weight  In 8 weeks.  (last of September.

## 2011-03-26 ENCOUNTER — Encounter: Payer: Self-pay | Admitting: Family Medicine

## 2011-03-31 ENCOUNTER — Telehealth: Payer: Self-pay | Admitting: Family Medicine

## 2011-03-31 ENCOUNTER — Encounter (HOSPITAL_BASED_OUTPATIENT_CLINIC_OR_DEPARTMENT_OTHER): Payer: 59 | Attending: Internal Medicine

## 2011-03-31 DIAGNOSIS — K219 Gastro-esophageal reflux disease without esophagitis: Secondary | ICD-10-CM | POA: Insufficient documentation

## 2011-03-31 DIAGNOSIS — L97309 Non-pressure chronic ulcer of unspecified ankle with unspecified severity: Secondary | ICD-10-CM | POA: Insufficient documentation

## 2011-03-31 DIAGNOSIS — E1142 Type 2 diabetes mellitus with diabetic polyneuropathy: Secondary | ICD-10-CM | POA: Insufficient documentation

## 2011-03-31 DIAGNOSIS — I10 Essential (primary) hypertension: Secondary | ICD-10-CM | POA: Insufficient documentation

## 2011-03-31 DIAGNOSIS — Z90710 Acquired absence of both cervix and uterus: Secondary | ICD-10-CM | POA: Insufficient documentation

## 2011-03-31 DIAGNOSIS — E1149 Type 2 diabetes mellitus with other diabetic neurological complication: Secondary | ICD-10-CM | POA: Insufficient documentation

## 2011-03-31 DIAGNOSIS — E1169 Type 2 diabetes mellitus with other specified complication: Secondary | ICD-10-CM | POA: Insufficient documentation

## 2011-03-31 DIAGNOSIS — Z79899 Other long term (current) drug therapy: Secondary | ICD-10-CM | POA: Insufficient documentation

## 2011-03-31 DIAGNOSIS — M329 Systemic lupus erythematosus, unspecified: Secondary | ICD-10-CM | POA: Insufficient documentation

## 2011-03-31 NOTE — Telephone Encounter (Signed)
Pt is needing refill on her Morphine - pls call when ready

## 2011-03-31 NOTE — Telephone Encounter (Signed)
I agree.  Please squeeze her in when you can.  She should have received a letter from [facility] stating that narcotics will not be refilled by new PCPs without first being seen.  Thanks for the heads up.

## 2011-03-31 NOTE — Telephone Encounter (Signed)
Ivy, I believe that this patient needs to come in, in order for this to be filled. Looks like last time it was filled was 11/25/2010 an was by Dr. Clotilde Dieter. Being that you are the new PCP would you agree to me having her make and appointment in order to have this medicine refilled. ---[place] Dec

## 2011-03-31 NOTE — Telephone Encounter (Signed)
LVM for patient to call back to inform of her having to make an appointment before refill can be made

## 2011-04-01 ENCOUNTER — Other Ambulatory Visit: Payer: Self-pay | Admitting: Family Medicine

## 2011-04-01 MED ORDER — MORPHINE SULFATE CR 15 MG PO TB12: 15.0000 mg | ORAL_TABLET | Freq: Two times a day (BID) | ORAL | Status: DC

## 2011-04-01 NOTE — Telephone Encounter (Signed)
Spoke with pt and pcp concerning the Rx. Dr. Sherron Flemings Sondra Come will be here this afternoon to give pt the Rx and pt agreed to make an appt with her.Laureen Ochs, Viann Shove

## 2011-04-26 ENCOUNTER — Other Ambulatory Visit: Payer: Self-pay | Admitting: Family Medicine

## 2011-04-26 NOTE — Telephone Encounter (Signed)
Refill request

## 2011-05-19 ENCOUNTER — Encounter (HOSPITAL_BASED_OUTPATIENT_CLINIC_OR_DEPARTMENT_OTHER): Payer: 59 | Attending: Internal Medicine

## 2011-05-19 DIAGNOSIS — M329 Systemic lupus erythematosus, unspecified: Secondary | ICD-10-CM | POA: Insufficient documentation

## 2011-05-19 DIAGNOSIS — L989 Disorder of the skin and subcutaneous tissue, unspecified: Secondary | ICD-10-CM | POA: Insufficient documentation

## 2011-05-24 ENCOUNTER — Encounter: Payer: 59 | Attending: Family Medicine | Admitting: Dietician

## 2011-05-24 DIAGNOSIS — Z713 Dietary counseling and surveillance: Secondary | ICD-10-CM | POA: Insufficient documentation

## 2011-05-24 DIAGNOSIS — I10 Essential (primary) hypertension: Secondary | ICD-10-CM | POA: Insufficient documentation

## 2011-05-24 DIAGNOSIS — E119 Type 2 diabetes mellitus without complications: Secondary | ICD-10-CM | POA: Insufficient documentation

## 2011-05-24 NOTE — Progress Notes (Signed)
Medical Nutrition Therapy:  Appt start time: 0800 end time:  0830.  Assessment:  Primary concerns today: Follow-up today regarding blood glucose levels, wound healing and weight status.  Reports that HgA1C on (03/21/11) was 11.1% and on (04/28/11) was 9.8%.  Blood glucose levels continue to be elevated.  Taking them at her fasting times and at other times during her day. (She works 11:00 PM-7:30 AM)  Most are in the 150-170-250-284 range.  She is eating 2 large meals per day and has an increased carb intake for the 3:00 AM lunch meal. The first ulcer on the RT ankle has healed, but she has a small ulcer approximately 1/4 inch  In diameter that has developed lower on her ankle/heel area of the RT foot.  She is being seen in the wound clinic for this new ulcer.  She is C/O pain and swelling in the LF knee and the LF ankle is swollen and has these areas of scarring/scab like appearance without an actual opening of the skin.  She is to be seen at the [facility] by Dr. Delanna Ahmadi on Nov. 9, 2012.   MEDICATIONS: Medications unchanged today.  Januvia is currently the only drug for diabetes.  Given her high numbers and the questionable/possible autoimmune effects with the Lupus and the Diabetes, I'm wondering if a low dose of Lantus insulin might help with better glucose control.  I suggest this based on the number of other oral medications that she is currently taking and the Lantus might address the issue of increased glucose if she has a "touch of type 1 diabetes."  DIETARY INTAKE:  24-hr recall:  B (8:00 AM):  Nothing, goes to bed  Snk (Mid- AM) :Sleeping  L (2-3:00PM): Gets up and considers this her breakfast meal.  Brown rice 1 cup (45 gm carb), and baked steak and a bag of popcorn (30 gm carb).  Snk (Mid PM): Nothing, working her second job 5:00-8:00 PM D (2:30-3:00 AM): This is lunch for her.  Last night.  Brown rice 1 dup (45 gm carb), Steak 3-4 oz, Chicken salad sandwich on  whole wheat bread (30 + gm carb) and a small bag of corn chips (39 gm carb) Snk (mid - PM): none Beverages: coffee using equal and cream and sugar free beverages.  Recent physical activity: very limited due to pain in the joints, the new ulcer and the edema and pain in the LF knee.  Progress Towards Goal(s):  Some progress.   Nutritional Diagnosis:  [place]-2.1 Inpaired nutrition utilization As related to glucose.  As evidenced by elevated HgA1C and continuing issues with tissue ulcers.    Intervention:  Nutrition Recommend that she decrease the carb intake at meals by spreading out her carb intake.  Recommended a snack at 8:00 PM following her part-time job.  She usually has a cup of coffee.  I recommend that she have a sandwich and a small bag of chips.  This snack would be a small meal of approximately 45 gm or carb.  This might keep her from being so hungry at 3:00 AM.  Handouts given during visit include:  Blood glucose record sheets.   Monitoring/Evaluation:  Dietary intake, exercise, blood glucose levels, and body weight , she is to follow-up with Ms Cranford Mon and if her glucose levels do not continue to decrease and if she needs other assistance, Ms. Alben Deeds will have her to return for my assistance.  [place] has my card and is to  call or e-mail with questions.

## 2011-05-24 NOTE — Patient Instructions (Signed)
   Try to eat a snack of sandwich and the small bag of chips at 8:30-9:00 PM instead of waiting until 3:00 AM.  See MD  Dermatology in [place] as planned.  Take your glucose records with you.  Continue to monitor your tissue status in the RT and LF foot.  Call MD if any change.  Keep checking blood glucose on regular basis and record.   Continue to take care of yourself.  You have lost to 236.1 lbs on my scales.    Your HgA1C has changer from 11.1 (7/23) to 9.8 on (8/30).

## 2011-06-02 ENCOUNTER — Encounter (HOSPITAL_BASED_OUTPATIENT_CLINIC_OR_DEPARTMENT_OTHER): Payer: 59 | Attending: Internal Medicine

## 2011-06-02 DIAGNOSIS — M329 Systemic lupus erythematosus, unspecified: Secondary | ICD-10-CM | POA: Insufficient documentation

## 2011-06-02 DIAGNOSIS — L989 Disorder of the skin and subcutaneous tissue, unspecified: Secondary | ICD-10-CM | POA: Insufficient documentation

## 2011-06-28 ENCOUNTER — Encounter: Payer: Self-pay | Admitting: Family Medicine

## 2011-06-28 ENCOUNTER — Ambulatory Visit (INDEPENDENT_AMBULATORY_CARE_PROVIDER_SITE_OTHER): Payer: 59 | Admitting: Family Medicine

## 2011-06-28 VITALS — HR 81 | Temp 97.5°F | Ht 65.6 in | Wt 238.0 lb

## 2011-06-28 DIAGNOSIS — E1149 Type 2 diabetes mellitus with other diabetic neurological complication: Secondary | ICD-10-CM

## 2011-06-28 DIAGNOSIS — M329 Systemic lupus erythematosus, unspecified: Secondary | ICD-10-CM | POA: Insufficient documentation

## 2011-06-28 DIAGNOSIS — E1142 Type 2 diabetes mellitus with diabetic polyneuropathy: Secondary | ICD-10-CM

## 2011-06-28 DIAGNOSIS — IMO0001 Reserved for inherently not codable concepts without codable children: Secondary | ICD-10-CM

## 2011-06-28 DIAGNOSIS — E1140 Type 2 diabetes mellitus with diabetic neuropathy, unspecified: Secondary | ICD-10-CM | POA: Insufficient documentation

## 2011-06-28 LAB — POCT GLYCOSYLATED HEMOGLOBIN (HGB A1C): Hemoglobin A1C: 7.9

## 2011-06-28 NOTE — Patient Instructions (Signed)
Please schedule an initial visit with Dr. Paulino Rily at [facility]. Continue to take Januvia until you meet with him. Schedule a follow up appointment with me in 3 months. Your A1c today was 7.9.

## 2011-06-30 ENCOUNTER — Encounter (HOSPITAL_BASED_OUTPATIENT_CLINIC_OR_DEPARTMENT_OTHER): Payer: 59 | Attending: Internal Medicine

## 2011-06-30 DIAGNOSIS — Z79899 Other long term (current) drug therapy: Secondary | ICD-10-CM | POA: Insufficient documentation

## 2011-06-30 DIAGNOSIS — I872 Venous insufficiency (chronic) (peripheral): Secondary | ICD-10-CM | POA: Insufficient documentation

## 2011-06-30 DIAGNOSIS — E119 Type 2 diabetes mellitus without complications: Secondary | ICD-10-CM | POA: Insufficient documentation

## 2011-06-30 DIAGNOSIS — M329 Systemic lupus erythematosus, unspecified: Secondary | ICD-10-CM | POA: Insufficient documentation

## 2011-06-30 DIAGNOSIS — G589 Mononeuropathy, unspecified: Secondary | ICD-10-CM | POA: Insufficient documentation

## 2011-06-30 DIAGNOSIS — R21 Rash and other nonspecific skin eruption: Secondary | ICD-10-CM | POA: Insufficient documentation

## 2011-06-30 DIAGNOSIS — K219 Gastro-esophageal reflux disease without esophagitis: Secondary | ICD-10-CM | POA: Insufficient documentation

## 2011-06-30 DIAGNOSIS — L97809 Non-pressure chronic ulcer of other part of unspecified lower leg with unspecified severity: Secondary | ICD-10-CM | POA: Insufficient documentation

## 2011-06-30 DIAGNOSIS — I10 Essential (primary) hypertension: Secondary | ICD-10-CM | POA: Insufficient documentation

## 2011-07-01 NOTE — Assessment & Plan Note (Signed)
Today A1c is 7.9 which is improved from 8.4. Discussed starting Metformin in addition to Januvia, but patient declined due to GI intolerance. I am not comfortable starting Insulin in a patient with an A1c of 7.9, despite requests from nutritionist. Advised patient to make an appointment with Dr. Paulino Rily for co-management. Follow up with me in 3 months.

## 2011-07-01 NOTE — Progress Notes (Signed)
  Subjective:     Bridget [name] is a 50 y.o. female who presents for follow up of diabetes.. Current symptoms include: foot ulcerations and paresthesia of the feet. Patient denies hypoglycemia , nausea, polydipsia, polyuria, visual disturbances, vomiting and weight loss. Evaluation to date has been: hemoglobin A1C. Home sugars: BGs have been labile ranging between 120s and 200s. Current treatments: Continued Januvia which has been somewhat effective.  Current A1C has improved from 8.4 to 7.9.  Per patient, her nutritionist is concerned about her foot ulcers and wants patient to start Insulin per PCP.    The following portions of the patient's history were reviewed and updated as appropriate: allergies, current medications, past family history, past medical history and problem list.  Review of Systems Pertinent items are noted in HPI.    Objective:    General appearance: alert, cooperative and no distress Heart: regular rate and rhythm, S1, S2 normal, no murmur, click, rub or gallop Abdomen: soft, non-tender; bowel sounds normal; no masses,  no organomegaly MSK: Rt ankle lateral wound, healing nicely.  No erythema, drainage or pus. Small abrasions on right lateral foot concerning for new diabetic ulcers. Neuro: no motor deficits, sensation intact, filament testing - normal Pulses: 1+ and symmetric      Assessment:    Diabetes mellitus Type II, under fair control.    Plan:    Counseling at today's visit: discussed the need for weight loss and discussed the advantages of a diet low in carbohydrates. Addressed ADA diet. Discussed foot care. Reminded to get yearly retinal exam. Dr. Sharla Kidney education referral.

## 2011-07-08 ENCOUNTER — Ambulatory Visit: Payer: 59 | Admitting: Pharmacist

## 2011-07-12 ENCOUNTER — Ambulatory Visit (INDEPENDENT_AMBULATORY_CARE_PROVIDER_SITE_OTHER): Payer: 59 | Admitting: Pharmacist

## 2011-07-12 ENCOUNTER — Encounter: Payer: Self-pay | Admitting: Pharmacist

## 2011-07-12 VITALS — BP 156/85 | HR 78 | Temp 98.2°F | Ht 65.6 in | Wt 241.0 lb

## 2011-07-12 DIAGNOSIS — IMO0001 Reserved for inherently not codable concepts without codable children: Secondary | ICD-10-CM

## 2011-07-12 NOTE — Assessment & Plan Note (Signed)
Diabetes for 12 years currently under fair control with use of [place]. History of GI intolerance to Metformin.  CBGs appear similar to recent A1C.  Unknown 2-hr post-prandial blood glucose control. Patient to check blood sugars approximately 2 hours after meals and bring results with her to appointment in 6-8 weeks.  We discussed the possibility of having to add mealtime insulin to cover peaks in sugars.  We also discussed the need to work on losing at least another 5-10 lbs and the benefit that it might have.  Total time in face to face counseling: 40 minutes.  Patient seen with Marijo Conception, PharmD Candidate

## 2011-07-12 NOTE — Progress Notes (Signed)
  Subjective:    Patient ID: Bridget [name], female    DOB: 11-01-1960, 50 y.o.   MRN: [id number]  HPI Reviewed and agree with Dr. Macky Lower management   Review of Systems     Objective:   Physical Exam        Assessment & Plan:

## 2011-07-12 NOTE — Progress Notes (Signed)
  Subjective:    Patient ID: Bridget [name], female    DOB: Jul 25, 1961, 50 y.o.   MRN: [id number]  HPI  Patient arrived in good spirits for follow-up for diabetes and wounds on ankle.  Patient works 2 jobs, one second shift, 1 third shift.  Has lost weight previously but is less able to lose weight do to problems with RA.  Patient still has pain and takes morphine sulfate as needed.  Wounds on ankle appeared to be healing.  Patient applying antibacterial cream and covering with adhesive bandages.  Patient expressed understanding of what makes her sugars run high and how she can work on losing weight through some adjustments to her diet and adding some exercise.  Patient expressed some interest in a membership to local [employer] for water aerobics but was worried about the cost and if she would be able to devote time to it given her lifestyle and schedule.    Review of Systems     Objective:   Physical Exam  BP 156/85  Pulse 78  Temp(Src) 98.2 F (36.8 C) (Oral)  Ht 5' 5.6" (1.666 m)  Wt 241 lb (109.317 kg)  BMI 39.37 kg/m2       Assessment & Plan:  Diabetes for 12 years currently under fair control with use of januvia. History of GI intolerance to Metformin.  CBGs appear similar to recent A1C.  Unknown 2-hr post-prandial blood glucose control. Patient to check blood sugars approximately 2 hours after meals and bring results with her to appointment in 6-8 weeks.  We discussed the possibility of having to add mealtime insulin to cover peaks in sugars.  We also discussed the need to work on losing at least another 5-10 lbs and the benefit that it might have.  Total time in face to face counseling: 40 minutes.  Patient seen with Marijo Conception, PharmD Candidate

## 2011-07-12 NOTE — Patient Instructions (Addendum)
Check blood sugars about 2 hours after eating Schedule appointment to meet back with Dr. Raymondo Band in 6-8 weeks.   Take omeprazole (Prilosec) every day rather than just when you need it

## 2011-08-04 ENCOUNTER — Other Ambulatory Visit: Payer: Self-pay | Admitting: Family Medicine

## 2011-08-04 ENCOUNTER — Encounter (HOSPITAL_BASED_OUTPATIENT_CLINIC_OR_DEPARTMENT_OTHER): Payer: 59 | Attending: Internal Medicine

## 2011-08-04 DIAGNOSIS — I10 Essential (primary) hypertension: Secondary | ICD-10-CM | POA: Insufficient documentation

## 2011-08-04 DIAGNOSIS — K219 Gastro-esophageal reflux disease without esophagitis: Secondary | ICD-10-CM | POA: Insufficient documentation

## 2011-08-04 DIAGNOSIS — G589 Mononeuropathy, unspecified: Secondary | ICD-10-CM | POA: Insufficient documentation

## 2011-08-04 DIAGNOSIS — R21 Rash and other nonspecific skin eruption: Secondary | ICD-10-CM | POA: Insufficient documentation

## 2011-08-04 DIAGNOSIS — M329 Systemic lupus erythematosus, unspecified: Secondary | ICD-10-CM | POA: Insufficient documentation

## 2011-08-04 DIAGNOSIS — E119 Type 2 diabetes mellitus without complications: Secondary | ICD-10-CM | POA: Insufficient documentation

## 2011-08-04 DIAGNOSIS — L97809 Non-pressure chronic ulcer of other part of unspecified lower leg with unspecified severity: Secondary | ICD-10-CM | POA: Insufficient documentation

## 2011-08-04 DIAGNOSIS — Z79899 Other long term (current) drug therapy: Secondary | ICD-10-CM | POA: Insufficient documentation

## 2011-08-04 DIAGNOSIS — I872 Venous insufficiency (chronic) (peripheral): Secondary | ICD-10-CM | POA: Insufficient documentation

## 2011-08-04 NOTE — Telephone Encounter (Signed)
Refill request

## 2011-09-13 ENCOUNTER — Other Ambulatory Visit: Payer: Self-pay | Admitting: Family Medicine

## 2011-09-13 DIAGNOSIS — Z1231 Encounter for screening mammogram for malignant neoplasm of breast: Secondary | ICD-10-CM

## 2011-09-14 ENCOUNTER — Encounter: Payer: Self-pay | Admitting: Family Medicine

## 2011-09-14 ENCOUNTER — Ambulatory Visit (INDEPENDENT_AMBULATORY_CARE_PROVIDER_SITE_OTHER): Payer: 59 | Admitting: Family Medicine

## 2011-09-14 VITALS — BP 117/76 | HR 81 | Temp 98.6°F | Ht 65.0 in | Wt 231.1 lb

## 2011-09-14 DIAGNOSIS — R110 Nausea: Secondary | ICD-10-CM

## 2011-09-14 DIAGNOSIS — IMO0001 Reserved for inherently not codable concepts without codable children: Secondary | ICD-10-CM

## 2011-09-14 DIAGNOSIS — M329 Systemic lupus erythematosus, unspecified: Secondary | ICD-10-CM

## 2011-09-14 MED ORDER — PROMETHAZINE HCL 12.5 MG PO TABS: 12.5000 mg | ORAL_TABLET | Freq: Four times a day (QID) | ORAL | Status: DC | PRN

## 2011-09-14 MED ORDER — SITAGLIPTIN PHOSPHATE 100 MG PO TABS: 100.0000 mg | ORAL_TABLET | Freq: Every day | ORAL | Status: DC

## 2011-09-14 NOTE — Assessment & Plan Note (Signed)
Elevated CBGs secondary to recent corticosteroid injection last week. Reassured patient this was normal following steroid injections. Discuss with her that her CBG should start coming back down to normal next week or so. Refilled her Januvia today. Recommended she keep her appointment pharmacy clinic later this month.

## 2011-09-14 NOTE — Assessment & Plan Note (Signed)
Questionable diagnosis especially in light of recent lab work done by rheumatology in [facility]. Will forward this as well as the above notes to Dr. Tye Savoy as the patient's primary care provider.

## 2011-09-14 NOTE — Assessment & Plan Note (Signed)
Likely secondary to elevated CGS Phenergan for relief. No systemic symptoms.

## 2011-09-14 NOTE — Patient Instructions (Signed)
Keep going with the diabetic diet that's been recommended. Take the Phenergan every 6 - 8 hours if you need it for nausea.  It can make you sleepy. Keep taking the Januvia, I sent in refills. Keep your appointment with Dr. Raymondo Band so he can review your blood sugars. It was good to meet you!

## 2011-09-14 NOTE — Progress Notes (Signed)
  Subjective:    Patient ID: Bridget [name], female    DOB: 1961-03-11, 51 y.o.   MRN: [id number]  HPI  1.  Nausea and elevated CBGs:  Patient seen by rheumatology in [facility] on Thursday which was 5 days ago. She received corticosteroid shots in each knee at that time for pain relief. She states was not warned this would cause her blood sugars to elevate. She measures her blood sugars daily. The day after the procedure her CBGs jumped from mid 100s to 293. Next it was 198 and 160 and then on Tuesday was 276. She is very concerned because of how high her blood sugars have been. She also reports nausea starting yesterday morning when her blood sugars measured 276. Has not actually had vomiting. Has decreased her eating because she does not want her CBGs to become too high.  No fevers or chills, no abdominal pain.  2.  RHeum FU:  She is also followed by wound care here in [facility] for diabetic ulcers located on her ankles. She states they referred her to dermatology and rheumatology in [facility]. After she saw them they told her to stop taking the Plaquenil which had been started by rheumatology in [place]. She states Rheumatology at [facility] ran lab work and told her she does not have lupus which is what the rheumatologist here in [place] was concerned about. As stated above they did give her the 2 corticosteroid shots for pain relief and diagnosed her with osteoarthritis of her knees.  Pain has improved following injections  Review of Systems See HPI above for review of systems.       Objective:   Physical Exam Gen:  Alert, cooperative patient who appears stated age in no acute distress.  Vital signs reviewed. Cardiac:  Regular rate and rhythm without murmur auscultated.  Good S1/S2. Pulm:  Clear to auscultation bilaterally with good air movement.  No wheezes or rales noted.   Abd:  Soft/nondistended/nontender.  Good bowel sounds throughout all four quadrants.  No masses noted.          Assessment & Plan:

## 2011-09-20 ENCOUNTER — Encounter: Payer: Self-pay | Admitting: Pharmacist

## 2011-09-20 ENCOUNTER — Ambulatory Visit (INDEPENDENT_AMBULATORY_CARE_PROVIDER_SITE_OTHER): Payer: 59 | Admitting: Pharmacist

## 2011-09-20 ENCOUNTER — Other Ambulatory Visit: Payer: Self-pay | Admitting: Family Medicine

## 2011-09-20 VITALS — BP 120/74 | HR 81 | Ht 66.0 in | Wt 236.5 lb

## 2011-09-20 DIAGNOSIS — IMO0001 Reserved for inherently not codable concepts without codable children: Secondary | ICD-10-CM

## 2011-09-20 MED ORDER — TRIAMCINOLONE ACETONIDE(NASAL) 55 MCG/ACT NA INHA: 2.0000 | Freq: Every day | NASAL | Status: DC

## 2011-09-20 MED ORDER — GLIMEPIRIDE 1 MG PO TABS: 1.0000 mg | ORAL_TABLET | Freq: Every day | ORAL | Status: DC

## 2011-09-20 NOTE — Patient Instructions (Addendum)
It was good to see you today! It would be a great idea if would be able to obtain a membership with the [employer], and water aerobics would be a very helpful exercise. Please continue to think about ways to exercise that would be okay with your knees.  The steroids have increased your blood sugars and you will start Amaryl (glimepiride) 1 mg daily and keep taking it the same time each day around a meal. In a week, we will call you and ask you about your blood sugars.  If they have started to come down, we may be able to stop the medication or we may have to increase the medicine if your blood sugars continue to be high.   Please do not skip meals with this medicine as it may cause your blood sugar to be too low, especially if you have been exercising too.

## 2011-09-20 NOTE — Telephone Encounter (Signed)
Refill request

## 2011-09-20 NOTE — Progress Notes (Signed)
  Subjective:    Patient ID: Bridget [name], female    DOB: 1961/04/09, 51 y.o.   MRN: [id number]  HPI Patient presents in good spirits today to discuss diabetes.  Recently had steroid injections placed in both knees (January 9th at [facility]) and was told that she has osteoarthritis and to stop taking Plaquenil because she does not have Lupus.  Patient reports dizziness and nausea after receiving steroid injections that she believes is secondary to her increased blood sugars.  She reports increased blood sugars higher than her normal values prior to the steroid injections with about a 100 point increase (from the mid 100's to mid 250's).  Patient states that she believes diet has been a reason for some of her increased blood sugars (fried foods, corn).  She expresses interest in joining [employer] and water aerobics as her knee pain has prohibited her from walking.   Review of Systems     Objective:   Physical Exam        Assessment & Plan:   Diabetes currently experiencing worsening control after steroid injections to her knees.   Lab Results  Component Value Date   HGBA1C 7.9 06/28/2011  During 2012 patient had improved glycemic control after changing diet and losing ~40lbs.      Self reported home fasting CBG readings of 150's before steroid injections and 200's after steroid injections, and random CBG readings of 150-250's. Control is suboptimal due to steroid injection (on 09/07/11) and occasional poor diet (fried foods). Denies hypoglycemic events.  Able to verbalize appropriate hypoglycemia management plan.  Started patient on glimepiride 1 mg daily and patient is to monitor BG closely for next week.  Written patient instructions provided.  Follow up with patient phone call in 1 week from pharm clinic to assess BG values and consider either increasing to glimepiride 2 mg daily if BG values are still elevated or discontinue glimepiride if her BG values have trended down again into the low  100's.  Encouraged patient to F/U with [employer] and water aerobics.  Total time in face to face counseling 40 minutes.  Patient seen with Tamala Bari, PharmD Candidate  .

## 2011-09-20 NOTE — Progress Notes (Signed)
  Subjective:    Patient ID: Bridget [name], female    DOB: 01/14/1961, 50 y.o.   MRN: [id number]  HPI Reviewed and agree with Dr. Koval's management   Review of Systems     Objective:   Physical Exam        Assessment & Plan:   

## 2011-09-20 NOTE — Assessment & Plan Note (Signed)
Diabetes currently experiencing worsening control after steroid injections to her knees.   Lab Results  Component Value Date   HGBA1C 7.9 06/28/2011  During 2012 patient had improved glycemic control after changing diet and losing ~40lbs.      Self reported home fasting CBG readings of 150's before steroid injections and 200's after steroid injections, and random CBG readings of 150-250's. Control is suboptimal due to steroid injection (on 09/07/11) and occasional poor diet (fried foods). Denies hypoglycemic events.  Able to verbalize appropriate hypoglycemia management plan.  Started patient on glimepiride 1 mg daily and patient is to monitor BG closely for next week.  Written patient instructions provided.  Follow up with patient phone call in 1 week from pharm clinic to assess BG values and consider either increasing to glimepiride 2 mg daily if BG values are still elevated or discontinue glimepiride if her BG values have trended down again into the low 100's.  Encouraged patient to F/U with [employer] and water aerobics.  Total time in face to face counseling 40 minutes.  Patient seen with Tamala Bari, PharmD Candidate

## 2011-09-26 ENCOUNTER — Ambulatory Visit
Admission: RE | Admit: 2011-09-26 | Discharge: 2011-09-26 | Disposition: A | Discharge status: Home / Self Care | Payer: 59 | Source: Ambulatory Visit | Attending: Family Medicine | Admitting: Family Medicine

## 2011-09-26 DIAGNOSIS — Z1231 Encounter for screening mammogram for malignant neoplasm of breast: Secondary | ICD-10-CM

## 2011-09-27 ENCOUNTER — Telehealth: Payer: Self-pay | Admitting: Pharmacist

## 2011-09-27 NOTE — Telephone Encounter (Signed)
Patient reported CBGs of 180 to 250 with rare > 250 since starting Amaryl 1mg  daily.   Asked to increase to 2X 1mg  daily.   Patient willing to change.    Patient instructed to take 2 mg daily until follow up with Dr. Tye Savoy or her blood sugar readings OR she can reduce back to 1mg  daily IF her readings become < 100.   Patient verbalized understanding of tx plan.

## 2011-11-09 ENCOUNTER — Ambulatory Visit (INDEPENDENT_AMBULATORY_CARE_PROVIDER_SITE_OTHER): Payer: 59 | Admitting: Family Medicine

## 2011-11-09 ENCOUNTER — Encounter: Payer: Self-pay | Admitting: Family Medicine

## 2011-11-09 VITALS — BP 135/90 | HR 82 | Temp 98.6°F | Ht 66.0 in | Wt 237.1 lb

## 2011-11-09 DIAGNOSIS — F329 Major depressive disorder, single episode, unspecified: Secondary | ICD-10-CM

## 2011-11-09 DIAGNOSIS — R52 Pain, unspecified: Secondary | ICD-10-CM

## 2011-11-09 DIAGNOSIS — IMO0001 Reserved for inherently not codable concepts without codable children: Secondary | ICD-10-CM

## 2011-11-09 DIAGNOSIS — G8929 Other chronic pain: Secondary | ICD-10-CM

## 2011-11-09 MED ORDER — DULOXETINE HCL 20 MG PO CPEP: 20.0000 mg | ORAL_CAPSULE | Freq: Every day | ORAL | Status: DC

## 2011-11-09 MED ORDER — GLIMEPIRIDE 1 MG PO TABS: 3.0000 mg | ORAL_TABLET | Freq: Every day | ORAL | Status: DC

## 2011-11-09 MED ORDER — MORPHINE SULFATE CR 15 MG PO TB12: 15.0000 mg | ORAL_TABLET | Freq: Two times a day (BID) | ORAL | Status: DC

## 2011-11-09 MED ORDER — CYCLOBENZAPRINE HCL 5 MG PO TABS: 5.0000 mg | ORAL_TABLET | Freq: Three times a day (TID) | ORAL | Status: DC | PRN

## 2011-11-09 NOTE — Progress Notes (Signed)
  Subjective:    Patient ID: Bridget [name], Bridget    DOB: 1961-04-05, 51 y.o.   MRN: [id number]  HPI  Patient presents as a work in for "pain all over body."  Pain started about one month ago.  Pain is located at ankles, hips, knees, low back, and neck.  Describes neck pain as spasms.  She has an obscure history of SLE and was seen at [facility] where they told patient she did not have SLE and pain was likely due to OA.  Patient says she is sad because she cannot work in so much pain.  She would like to call off work most days.  Patient requesting for me to fill out FMLA forms so she can take off work.  For pain, patient takes MS contin 15 BID but this makes her sleepy at work.  Therefore, she wants FMLA forms completed.    Patient complains of decreased appetite, loss of interest and pleasure in activities, trouble concentrating, and feeling tired.  She denies any homicidal or suicidal thoughts.  I have reviewed patient's SH, PMH, medications/allergies, and problem list. Review of Systems  Per HPI    Objective:   Physical Exam  Constitutional: No distress.       Teary-eyed  Neck: Normal range of motion. Neck supple.       Pain on palpation paraspinal muscles  Cardiovascular: Regular rhythm.   No murmur heard. Pulmonary/Chest: Effort normal.  Musculoskeletal: Normal range of motion. She exhibits edema.       Tenderness on palpation of bilateral knee/patella  Neurological: She is alert.  Skin: Skin is warm. No rash noted.  Psychiatric: Her speech is normal and behavior is normal. She exhibits a depressed mood. She expresses no homicidal and no suicidal ideation.          Assessment & Plan:

## 2011-11-09 NOTE — Patient Instructions (Signed)
Thank you for coming into see me today. Please take Cymbalta daily for both pain and depressed mood. Take Flexeril for 10 days as needed for neck spasms. We will call you with time and date of Physical Therapy appointment. Please call in one week regarding FMLA papers. Schedule follow up appointment with me in 4 weeks or sooner if needed if your symptoms worsen.

## 2011-11-09 NOTE — Assessment & Plan Note (Addendum)
Add Flexeril x 10 days for muscle spasms. Refill MS Contin 15 PO BID. Add Cymbalta. Referral to PT ordered today. Follow up in 4 weeks.

## 2011-11-09 NOTE — Assessment & Plan Note (Signed)
Start Cymbalta 20 mg daily.  Follow up in 4 weeks.

## 2011-11-09 NOTE — Assessment & Plan Note (Signed)
Increased Amaryl to 3 mg daily.

## 2011-11-16 ENCOUNTER — Telehealth: Payer: Self-pay | Admitting: Family Medicine

## 2011-11-16 NOTE — Telephone Encounter (Signed)
Left papers at front desk for pick up

## 2011-11-16 NOTE — Telephone Encounter (Signed)
Please call Bridget [name] back regarding the FMLA papers she left at last appt.

## 2011-11-21 ENCOUNTER — Other Ambulatory Visit: Payer: Self-pay | Admitting: Family Medicine

## 2011-11-29 ENCOUNTER — Telehealth: Payer: Self-pay | Admitting: Family Medicine

## 2011-11-29 NOTE — Telephone Encounter (Signed)
[employer] faxed forms to be completed by you for patient's insurance for [employer].  Patient could not file claim under workers comp, so she need these competed and faxed back to [place] asap in order for her to receive benefits.  Currently only working 15 hrs weekly due to condition.  Please inform when process have been done.  Still haven't heard anything regarding physical therapy appt.

## 2011-11-29 NOTE — Telephone Encounter (Signed)
Just kidding, Britta Mccreedy.  I received the fax from [place] today in my mailbox.  I will have it ready for patient by the end of this week.  Thanks.

## 2011-11-29 NOTE — Telephone Encounter (Signed)
Hi Babs, I completed the FMLA papers and left them at the front desk last week.  I must not have routed the phone message correctly.  I'll check to see if they are still up there.

## 2011-11-30 ENCOUNTER — Telehealth: Payer: Self-pay | Admitting: Family Medicine

## 2011-11-30 NOTE — Telephone Encounter (Signed)
lvm informing pt that Paperwork faxed today.Loralee [address]

## 2011-11-30 NOTE — Telephone Encounter (Signed)
Please let patient know that short term disability forms were faxed to [facility] today.  Thanks.

## 2011-12-08 ENCOUNTER — Encounter: Payer: Self-pay | Admitting: Family Medicine

## 2011-12-08 ENCOUNTER — Ambulatory Visit (INDEPENDENT_AMBULATORY_CARE_PROVIDER_SITE_OTHER): Payer: 59 | Admitting: Family Medicine

## 2011-12-08 VITALS — BP 136/84 | HR 92 | Temp 99.2°F | Ht 66.0 in | Wt 232.0 lb

## 2011-12-08 DIAGNOSIS — G8929 Other chronic pain: Secondary | ICD-10-CM

## 2011-12-08 DIAGNOSIS — IMO0001 Reserved for inherently not codable concepts without codable children: Secondary | ICD-10-CM

## 2011-12-08 DIAGNOSIS — R52 Pain, unspecified: Secondary | ICD-10-CM

## 2011-12-08 LAB — POCT UA - MICROALBUMIN
Albumin/Creatinine Ratio, Urine, POC: <30
Microalbumin Ur, POC: 30 mg/dL

## 2011-12-08 NOTE — Assessment & Plan Note (Signed)
Will continue Amaryl and Januvia and repeat A1c in 2 months. Continue to follow up with wound care and eye doctor.

## 2011-12-08 NOTE — Progress Notes (Signed)
  Subjective:     Bridget [name] is a 51 y.o. female who presents for follow up of diabetes.. Current symptoms include: foot ulcerations and paresthesia of the feet. Patient denies hyperglycemia, hypoglycemia , increased appetite, nausea, polydipsia, polyuria, visual disturbances, vomiting and weight loss. Evaluation to date has been: hemoglobin A1C.  She checks sugars once a day ans states her sugars have been better controlled now that she is working part-time.  Home sugars: BGs range between 96 and 172. Current treatments: Lantus 32 units daily and Novolog 8 units TID.   She was last seen by eye doctor on 12/02/2011 and diagnosed with glaucoma.  She has a follow up appointment next month with Dr. Beverely Pace.  Patient also says chronic pain at multiple sites has improved with Cymbalta.  She has noticed an improvement when she takes both Cymbalta and Flexeril.  She is still waiting on short term disability approval.  The following portions of the patient's history were reviewed and updated as appropriate: allergies, current medications, past medical history, past social history and problem list.  Review of Systems Pertinent items are noted in HPI.    Objective:    General appearance: alert, cooperative and mild distress Head: Normocephalic, without obvious abnormality, atraumatic Lungs: clear to auscultation bilaterally Heart: regular rate and rhythm, S1, S2 normal, no murmur, click, rub or gallop Extremities: extremities normal, atraumatic, no cyanosis or edema Skin: Skin color, texture, turgor normal. No rashes or lesions Diabetic Foot Exam: skin is dry with cracked heels, pin prick sensation diminished at bilateral heels, normal proprioception; no open lesions or ulcers at this time    Laboratory: No components found with this basename: A1C      Assessment:    Diabetes mellitus Type II, under satisfactory control.    Plan:    Addressed ADA diet. Discussed foot care. Reminded to  get yearly retinal exam. Reminded to bring in blood sugar diary at next visit. Follow up in 2 months or as needed.

## 2011-12-08 NOTE — Patient Instructions (Signed)
Return to clinic in 2 months. Continue your current medications. Go to your eye doctor appointment and call your wound care doctor for an appointment.

## 2011-12-08 NOTE — Assessment & Plan Note (Signed)
Continue Cymbalta unless eye doctor is concerned about worsening glaucoma. Will consider switching to SSRI or amitriptyline  if patient cannot tolerated Cymbalta.

## 2011-12-13 ENCOUNTER — Other Ambulatory Visit: Payer: Self-pay | Admitting: Family Medicine

## 2011-12-21 ENCOUNTER — Encounter: Payer: Self-pay | Admitting: Family Medicine

## 2011-12-21 ENCOUNTER — Ambulatory Visit (INDEPENDENT_AMBULATORY_CARE_PROVIDER_SITE_OTHER): Payer: 59 | Admitting: Family Medicine

## 2011-12-21 VITALS — BP 119/71 | HR 83 | Temp 98.4°F | Ht 66.0 in | Wt 234.6 lb

## 2011-12-21 DIAGNOSIS — G8929 Other chronic pain: Secondary | ICD-10-CM

## 2011-12-21 DIAGNOSIS — R52 Pain, unspecified: Secondary | ICD-10-CM

## 2011-12-21 NOTE — Progress Notes (Signed)
  Subjective:    Patient ID: Bridget [name], female    DOB: 04-22-1961, 51 y.o.   MRN: [id number]  HPI  Patient returns to clinic for a note to return to work. Patient states that she is happy working 3 days a week, however now she is having trouble paying her bills. She has applied for short term disability, but she was told that her claim needed to be reviewed and that it would take several weeks. Patient is frustrated and needs to return to work to make money. Patient believes that her chronic neck pain, muscle spasms, multiple joint pain is secondary to work. She has gone to occupational health prior to coming to PCP and her Worker's Comp. was denied. Patient has a physical therapy appointment scheduled for the end of April. She is currently on Flexeril for muscle spasms which seems to improve pain. Patient will return to work full time and go to physical therapy for the next month. Of note, patient was referred to rheumatology and was told that she may have SLE and started on Plaquenil. Patient was not happy with this diagnosis and stopped taking the medication. Currently, patient's pain is well-controlled and she denies any associated complaints.  Review of Systems Per history of present illness    Objective:   Physical Exam  Constitutional: She is oriented to person, place, and time. No distress.  HENT:  Head: Normocephalic and atraumatic.  Neck: Normal range of motion. Neck supple.  Neurological: She is alert and oriented to person, place, and time. No cranial nerve deficit. Coordination normal.          Assessment & Plan:

## 2011-12-21 NOTE — Assessment & Plan Note (Signed)
Patient was given a letter to return to work full-time. Continue Flexeril for muscle spasms. She may take high-dose Tylenol as needed for pain at work. Patient to start physical therapy at the end of the month. Will followup physical therapy recommendations. Patient to return to clinic as needed.

## 2011-12-27 ENCOUNTER — Ambulatory Visit: Payer: 59 | Attending: Family Medicine

## 2011-12-27 DIAGNOSIS — R293 Abnormal posture: Secondary | ICD-10-CM | POA: Insufficient documentation

## 2011-12-27 DIAGNOSIS — M2550 Pain in unspecified joint: Secondary | ICD-10-CM | POA: Insufficient documentation

## 2011-12-27 DIAGNOSIS — R5381 Other malaise: Secondary | ICD-10-CM | POA: Insufficient documentation

## 2011-12-27 DIAGNOSIS — IMO0001 Reserved for inherently not codable concepts without codable children: Secondary | ICD-10-CM | POA: Insufficient documentation

## 2011-12-28 ENCOUNTER — Telehealth: Payer: Self-pay | Admitting: *Deleted

## 2011-12-28 NOTE — Telephone Encounter (Signed)
Spoke to [place] and answered questions.

## 2011-12-28 NOTE — Telephone Encounter (Signed)
Sissy Hoff with [employer] is requesting to speak with Dr. Tye Savoy.  States Bodhi's disability payments are being held up because of one little piece of information they need.  Please call her at [phone]. Ileana Ladd

## 2012-01-05 ENCOUNTER — Ambulatory Visit: Payer: 59 | Attending: Family Medicine

## 2012-01-05 DIAGNOSIS — R5381 Other malaise: Secondary | ICD-10-CM | POA: Insufficient documentation

## 2012-01-05 DIAGNOSIS — R293 Abnormal posture: Secondary | ICD-10-CM | POA: Insufficient documentation

## 2012-01-05 DIAGNOSIS — IMO0001 Reserved for inherently not codable concepts without codable children: Secondary | ICD-10-CM | POA: Insufficient documentation

## 2012-01-05 DIAGNOSIS — M2550 Pain in unspecified joint: Secondary | ICD-10-CM | POA: Insufficient documentation

## 2012-01-12 ENCOUNTER — Ambulatory Visit: Payer: 59

## 2012-01-19 ENCOUNTER — Ambulatory Visit: Payer: 59

## 2012-01-24 ENCOUNTER — Ambulatory Visit: Payer: 59

## 2012-01-31 ENCOUNTER — Other Ambulatory Visit: Payer: Self-pay | Admitting: Family Medicine

## 2012-02-02 ENCOUNTER — Ambulatory Visit: Payer: 59 | Attending: Family Medicine

## 2012-02-02 DIAGNOSIS — M2550 Pain in unspecified joint: Secondary | ICD-10-CM | POA: Insufficient documentation

## 2012-02-02 DIAGNOSIS — R5381 Other malaise: Secondary | ICD-10-CM | POA: Insufficient documentation

## 2012-02-02 DIAGNOSIS — R293 Abnormal posture: Secondary | ICD-10-CM | POA: Insufficient documentation

## 2012-02-02 DIAGNOSIS — IMO0001 Reserved for inherently not codable concepts without codable children: Secondary | ICD-10-CM | POA: Insufficient documentation

## 2012-02-08 ENCOUNTER — Ambulatory Visit (INDEPENDENT_AMBULATORY_CARE_PROVIDER_SITE_OTHER): Payer: 59 | Admitting: Family Medicine

## 2012-02-08 ENCOUNTER — Encounter: Payer: Self-pay | Admitting: Family Medicine

## 2012-02-08 VITALS — BP 130/82 | HR 79 | Temp 97.4°F | Ht 66.0 in | Wt 234.0 lb

## 2012-02-08 DIAGNOSIS — E1149 Type 2 diabetes mellitus with other diabetic neurological complication: Secondary | ICD-10-CM

## 2012-02-08 DIAGNOSIS — IMO0001 Reserved for inherently not codable concepts without codable children: Secondary | ICD-10-CM

## 2012-02-08 DIAGNOSIS — R110 Nausea: Secondary | ICD-10-CM

## 2012-02-08 DIAGNOSIS — E1140 Type 2 diabetes mellitus with diabetic neuropathy, unspecified: Secondary | ICD-10-CM

## 2012-02-08 DIAGNOSIS — E1142 Type 2 diabetes mellitus with diabetic polyneuropathy: Secondary | ICD-10-CM

## 2012-02-08 LAB — POCT GLYCOSYLATED HEMOGLOBIN (HGB A1C): Hemoglobin A1C: 7.5

## 2012-02-08 MED ORDER — GABAPENTIN 100 MG PO CAPS: 100.0000 mg | ORAL_CAPSULE | Freq: Two times a day (BID) | ORAL | Status: DC | PRN

## 2012-02-08 NOTE — Patient Instructions (Addendum)
It was great to see you.  Your A1c decreased to 7.5 (from 7.9). Keep up the good work. I will send Gabapentin 100 mg to your pharmacy. Take this twice daily. Stop taking Gabapentin 300 mg for now. I will refer you to Occupational Therapy.  We will call with time and date. Schedule follow up appointment with Dr. Tye Savoy in 3 months or sooner as needed.  Basic Carbohydrate Counting Basic carbohydrate counting is a way to plan meals. It is done by counting the amount of carbohydrate in foods. Eating carbohydrates increases blood glucose (sugar) levels. People with diabetes use carbohydrate counting to help keep their blood glucose at a normal level.  Foods that have carbohydrates are starches (grains, beans, starchy vegetables) and sweets.  COUNTING CARBOHYDRATES IN FOODS The first step in counting carbohydrates is to learn how many carbohydrate servings you should have in every meal. A dietitian can plan this for you. After learning the amount of carbohydrates to include in your meal plan, you can start to choose the carbohydrate-containing foods you want to eat.  There are 2 ways to identify the amount of carbohydrates in the foods you eat.  Read the Nutrition Facts panel on food labels. All you need are 2 pieces of information from the Nutrition Facts panel to count carbohydrates this way:   Serving size.   Total carbohydrate (in grams).  Decide how many servings you will be eating. If it is 1 serving, you will be eating the amount of carbohydrate listed on the panel. If you will be eating 2 servings, you will be eating double the amount of carbohydrate listed on the panel.   Learn serving sizes. A serving size of most carbohydrate-containing foods is about 15 g. Listed below are serving sizes of common carbohydrate-containing foods:   1 slice bread.    cup unsweetened, dry cereal.    cup hot cereal.   ? cup rice.    cup mashed potatoes.   ? cup pasta.   1 cup fresh fruit.      cup canned fruit.   1 cup milk (whole, 2%, or skim).    cup starchy vegetables (peas, corn, or potatoes).  Counting carbohydrates this way is similar to looking on the Nutrition Facts panel. Decide how many servings you will eat first. Multiply the number of servings you eat by 15 g. For example, if you have 2 cups of strawberries, you had 2 servings. That means you had 30 g of carbohydrate (2 servings x 15 g = 30 g). CALCULATING CARBOHYDRATES IN A MEAL Sample dinner  3 oz chicken breast.   ? cup brown rice.    cup corn.   1 cup fat-free milk.   1 cup strawberries with sugar-free whipped topping.  Carbohydrate calculation First, identify the foods that contain carbohydrate:  Rice.   Corn.   Milk.   Strawberries.  Calculate the number of servings eaten:  2 servings rice.   1 serving corn.   1 serving milk.   1 serving strawberries.  Multiply the number of servings by 15 g:  2 servings rice x 15 g = 30 g.   1 serving corn x 15 g = 15 g.   1 serving milk x 15 g = 15 g.   1 serving strawberries x 15 g = 15 g.  Add the amounts to find the total carbohydrates eaten: 30 g + 15 g + 15 g + 15 g = 75 g carbohydrate eaten  at dinner. Document Released: 08/15/2005 Document Revised: 08/04/2011 Document Reviewed: 07/01/2011 [facility] Patient Information 2012 [place], [place].

## 2012-02-09 ENCOUNTER — Ambulatory Visit: Payer: 59

## 2012-02-09 NOTE — Assessment & Plan Note (Signed)
Multi-factorial - uncontrolled DM vs. Medication (Gabapentin, Flexeril, Cymbalta, oxycodone) vs. GERD. Will decrease Gabapentin from 300 mg to 100. Advised her to take oxycodone and Flexeril only AS NEEDED and not to take all at one time. Encouraged her to take Amaryl 3 mg as directed to for better control of DM. Follow up in 3 months or as needed.

## 2012-02-09 NOTE — Progress Notes (Signed)
  Subjective:    Patient ID: Bridget [name], female    DOB: 08-28-1961, 51 y.o.   MRN: [id number]  HPI  Patient here for diabetes follow up, due for A1c today.  She has a wound care doctor who takes care of her foot ulcers as needed, but no podiatrist.  She sees an eye doctor every year for glaucoma.  Patient is doing well, but still complains of neck pain and chronic back pain.  She is now going to PT which she think is helping.  PT recommended an OT referral to evaluate her work space.  I told her I'd be happy to refer her.  Patient also complains of nausea which has been an ongoing issue.  She has cut back on Amaryl from 3 to 2 mg because she thinks they made her feel nauseated.  However, she takes Amaryl, Januvia, Cymbalta, Neurontin, Flexeril all at once in the morning then lays down and when she wakes up she is nauseated.  She also has a Rx for MS Contin and takes it as needed (which is once per week).  CBG runs from 97-170 at home.  She admits to not eating healthy and cannot exercise much due to chronic pain.  Denies any fever, chills, NS, chest pain, SOB, changes in vision, or vomiting.  Endorses nausea and mild tingling/neuropathy bilateral feet.  Review of Systems  Per HPI    Objective:   Physical Exam  Constitutional: No distress.  HENT:  Head: Normocephalic and atraumatic.  Cardiovascular: Normal rate and regular rhythm.   Pulmonary/Chest: Effort normal and breath sounds normal.  Abdominal: Soft. Bowel sounds are normal. She exhibits no distension. There is no tenderness.  Skin:       Diabetic foot exam: Normal pin-prick exam, no open wounds or ulcers          Assessment & Plan:

## 2012-02-09 NOTE — Assessment & Plan Note (Signed)
Will decrease Gabapentin from 300 BID to 100 BID to see if this helps with nausea. Follow up in 3 months.

## 2012-02-09 NOTE — Assessment & Plan Note (Signed)
Patient's A1c has improved from 7.9 to 7.5.  Praised patient for good work. But elevated CBG could still be causing nausea. Encouraged further physical activity and weight loss. Counseled on routine foot exams. Repeat A1c in 3 months.

## 2012-02-16 ENCOUNTER — Ambulatory Visit: Payer: 59

## 2012-02-22 ENCOUNTER — Ambulatory Visit: Payer: 59 | Admitting: Occupational Therapy

## 2012-03-14 ENCOUNTER — Other Ambulatory Visit: Payer: Self-pay | Admitting: Family Medicine

## 2012-04-27 ENCOUNTER — Other Ambulatory Visit: Payer: Self-pay | Admitting: Family Medicine

## 2012-05-14 ENCOUNTER — Other Ambulatory Visit: Payer: Self-pay | Admitting: *Deleted

## 2012-05-15 MED ORDER — GLUCOSAMINE-CHONDROIT-MSM-C-MN PO TABS: 1.0000 | ORAL_TABLET | Freq: Every day | ORAL | Status: DC

## 2012-05-21 ENCOUNTER — Encounter: Payer: Self-pay | Admitting: Family Medicine

## 2012-05-21 ENCOUNTER — Ambulatory Visit (INDEPENDENT_AMBULATORY_CARE_PROVIDER_SITE_OTHER): Payer: 59 | Admitting: Family Medicine

## 2012-05-21 VITALS — BP 134/70 | HR 78 | Temp 98.1°F | Ht 66.0 in | Wt 234.6 lb

## 2012-05-21 DIAGNOSIS — T192XXA Foreign body in vulva and vagina, initial encounter: Secondary | ICD-10-CM

## 2012-05-21 DIAGNOSIS — W448XXA Other foreign body entering into or through a natural orifice, initial encounter: Secondary | ICD-10-CM | POA: Insufficient documentation

## 2012-05-21 DIAGNOSIS — IMO0001 Reserved for inherently not codable concepts without codable children: Secondary | ICD-10-CM

## 2012-05-21 HISTORY — DX: Foreign body in vulva and vagina, initial encounter: T19.2XXA

## 2012-05-21 LAB — POCT GLYCOSYLATED HEMOGLOBIN (HGB A1C): Hemoglobin A1C: 7.8

## 2012-05-21 NOTE — Assessment & Plan Note (Signed)
No tampon found on either speculum exam or bimanual exam. Warned patient of fever, discharge, foul odor, nausea/vomiting, chills - she is to report to ER or call MD.

## 2012-05-21 NOTE — Patient Instructions (Addendum)
Your Alc is 7.8.  It was 7.5 3 months ago. Continue to eat a low carb diet and increase physical activity. Continue your current medications for now. Return to clinic in 3 months or sooner as needed.

## 2012-05-21 NOTE — Progress Notes (Signed)
  Subjective:     Bridget [name] is a 51 y.o. female who presents for follow up of diabetes.  Patient says she has not been taking medications daily.  She went to the beach for one week and ran out of Amaryl.  She was taking Januvia at the beach, but then she ran out of that medication and did not request refills because she was too busy and has been struggling with other bills.  Patient was off of both medications for about one week, but she was able to get refills and has been compliant for the last 2 weeks.  Current symptoms include: none. Patient denies foot ulcerations, increased appetite, nausea, paresthesia of the feet and visual disturbances. Evaluation to date has been: fasting blood sugar and hemoglobin A1C. Home sugars: BGs range between 150 and 230.  She has an eye doctor but has not been back to see him lately because she cannot afford the co-pay at this time.  Retained tampon: patient has had a hysterectomy, but uses tampons to prevent infections from swimming in pools or hot tubs.  She was at the beach on 9/4 and inserted a tampon.  She says she has not been able to remove it.  Patient denies any fever, chills, NS, nausea or vomiting.  She has not had intercourse because she is afraid is still in there.  Does not remember the tampon falling out.  Review of Systems Pertinent items are noted in HPI.    Objective:    BP 134/70  Pulse 78  Temp 98.1 F (36.7 C) (Oral)  Ht 5\' 6"  (1.676 m)  Wt 234 lb 9.6 oz (106.414 kg)  BMI 37.87 kg/m2 General appearance: alert, cooperative and no distress Lungs: clear to auscultation bilaterally Heart: regular rate and rhythm, S1, S2 normal, no murmur, click, rub or gallop Pelvic: external genitalia normal, no adnexal masses or tenderness, no cervical motion tenderness and positive findings: foul discharge and odor; no evidence of retained tampon, could not palpate any tampon in vaginal vault  Laboratory: No components found with this basename:  A1C      Assessment:    Diabetes mellitus Type II, under fair control. Retained Tampon    Plan:   See Problem List

## 2012-05-21 NOTE — Assessment & Plan Note (Signed)
Patient has been non-compliant with Januvia and Amaryl.  She ran out of both for one week.  Started taking them again about 2 weeks ago.  Alc increased from 7.5 to 7.8 today. - Advised patient to adhere to low carb diet  - Increase Amaryl to 3 as we discussed at last visit - Follow up in 3 months

## 2012-06-04 ENCOUNTER — Other Ambulatory Visit: Payer: Self-pay | Admitting: Family Medicine

## 2012-06-19 ENCOUNTER — Other Ambulatory Visit: Payer: Self-pay | Admitting: Family Medicine

## 2012-07-30 ENCOUNTER — Encounter: Payer: Self-pay | Admitting: Family Medicine

## 2012-07-30 ENCOUNTER — Ambulatory Visit (INDEPENDENT_AMBULATORY_CARE_PROVIDER_SITE_OTHER): Payer: 59 | Admitting: Family Medicine

## 2012-07-30 VITALS — BP 130/80 | HR 76 | Ht 66.0 in | Wt 235.0 lb

## 2012-07-30 DIAGNOSIS — R079 Chest pain, unspecified: Secondary | ICD-10-CM

## 2012-07-30 NOTE — Progress Notes (Signed)
Patient ID: Bridget [name], female   DOB: [birth year], 51 y.o.   MRN: [id number] Subjective: The patient is a 51 y.o. year old female who presents today for right axillary pain.  Pain began 2 mornings ago when woke up.  Made worse by movement.  Present in right axilla, radiating up in neck.  Worse with breathing.  Has been relatively constant.  Always present.  Reports no abnormal movement 3 days ago and no pain at that time.  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  No heavy lifting.  Has tried taking flexeril to minimal relief.  Has not taken her MS Contin and does not like NSAIDs (upset stomach).  Patient denies cough or leg swelling.  Deep breaths does make somewhat worse as this is movement.  Patient's past medical, social, and family history were reviewed and updated as appropriate. History  Substance Use Topics  . Smoking status: Former Smoker -- 0.3 packs/day    Types: Cigarettes    Quit date: 08/29/1998  . Smokeless tobacco: Never Used     Comment: Quit in 2000   . Alcohol Use: Not on file   Objective:  Filed Vitals:   07/30/12 0922  BP: 130/80  Pulse: 76   Gen: Mildly uncomfortable appearing CV: RRR Resp: CTABL Ext: No right axillary adenopathy or pain with palpation.  Patient indicates pain is located 8-10 cm inferior to axilla and is [name] to define.  Intermittently, palpation make worse.  Raising arm appears to make slightly worse but there is no true limitation of motion due to pain.  No swelling or point tenderness of right shoulder.  Full ROM of neck without pain.  Assessment/Plan: Likelihood of MI or PE is relatively low (not related to exercise, no cough, pain is mainly related to movement).  Advised of further red flags for these.  Will rec conservative treatment and RTC in 1 week if not better.  Please also see individual problems in problem list for problem-specific plans.

## 2012-07-30 NOTE — Patient Instructions (Signed)
It was good to see you today. Please continue to take your flexeril.  Also take 400mg  of Motrin three times per day with food. If you develop any shortness of breath, coughing up blood, dizziness when standing, or painful leg swelling, come to see [place] or go to the emergency room right away.

## 2012-08-29 HISTORY — PX: COLONOSCOPY: SHX174

## 2012-09-07 ENCOUNTER — Other Ambulatory Visit: Payer: Self-pay | Admitting: Family Medicine

## 2012-09-11 ENCOUNTER — Other Ambulatory Visit: Payer: Self-pay | Admitting: Family Medicine

## 2012-09-11 DIAGNOSIS — Z1231 Encounter for screening mammogram for malignant neoplasm of breast: Secondary | ICD-10-CM

## 2012-09-18 ENCOUNTER — Emergency Department (HOSPITAL_COMMUNITY): Payer: 59

## 2012-09-18 ENCOUNTER — Encounter (HOSPITAL_COMMUNITY): Payer: Self-pay | Admitting: *Deleted

## 2012-09-18 ENCOUNTER — Telehealth: Payer: Self-pay | Admitting: Obstetrics and Gynecology

## 2012-09-18 ENCOUNTER — Encounter (HOSPITAL_COMMUNITY): Payer: Self-pay | Admitting: Emergency Medicine

## 2012-09-18 ENCOUNTER — Emergency Department (HOSPITAL_COMMUNITY)
Admission: EM | Admit: 2012-09-18 | Discharge: 2012-09-18 | Disposition: A | Discharge status: Nursing Home (Includes ICF) | Payer: 59 | Source: Home / Self Care | Attending: Emergency Medicine | Admitting: Emergency Medicine

## 2012-09-18 ENCOUNTER — Emergency Department (HOSPITAL_COMMUNITY)
Admission: EM | Admit: 2012-09-18 | Discharge: 2012-09-18 | Disposition: A | Discharge status: Home / Self Care | Payer: 59 | Attending: Emergency Medicine | Admitting: Emergency Medicine

## 2012-09-18 DIAGNOSIS — R110 Nausea: Secondary | ICD-10-CM | POA: Insufficient documentation

## 2012-09-18 DIAGNOSIS — E119 Type 2 diabetes mellitus without complications: Secondary | ICD-10-CM | POA: Insufficient documentation

## 2012-09-18 DIAGNOSIS — K5792 Diverticulitis of intestine, part unspecified, without perforation or abscess without bleeding: Secondary | ICD-10-CM

## 2012-09-18 DIAGNOSIS — R911 Solitary pulmonary nodule: Secondary | ICD-10-CM | POA: Insufficient documentation

## 2012-09-18 DIAGNOSIS — Z8739 Personal history of other diseases of the musculoskeletal system and connective tissue: Secondary | ICD-10-CM | POA: Insufficient documentation

## 2012-09-18 DIAGNOSIS — Z90710 Acquired absence of both cervix and uterus: Secondary | ICD-10-CM | POA: Insufficient documentation

## 2012-09-18 DIAGNOSIS — K219 Gastro-esophageal reflux disease without esophagitis: Secondary | ICD-10-CM | POA: Insufficient documentation

## 2012-09-18 DIAGNOSIS — K5732 Diverticulitis of large intestine without perforation or abscess without bleeding: Secondary | ICD-10-CM

## 2012-09-18 DIAGNOSIS — Z79899 Other long term (current) drug therapy: Secondary | ICD-10-CM | POA: Insufficient documentation

## 2012-09-18 DIAGNOSIS — Z87891 Personal history of nicotine dependence: Secondary | ICD-10-CM | POA: Insufficient documentation

## 2012-09-18 HISTORY — DX: Unspecified osteoarthritis, unspecified site: M19.90

## 2012-09-18 HISTORY — DX: Gastro-esophageal reflux disease without esophagitis: K21.9

## 2012-09-18 HISTORY — DX: Type 2 diabetes mellitus without complications: E11.9

## 2012-09-18 LAB — GLUCOSE, CAPILLARY: Glucose-Capillary: 211 mg/dL — ABNORMAL HIGH (ref 70–99)

## 2012-09-18 LAB — CBC WITH DIFFERENTIAL/PLATELET
Basophils Absolute: 0 10*3/uL (ref 0.0–0.1)
Basophils Relative: 0 % (ref 0–1)
Eosinophils Absolute: 0.2 10*3/uL (ref 0.0–0.7)
Eosinophils Relative: 2 % (ref 0–5)
Lymphocytes Relative: 25 % (ref 12–46)
MCHC: 35.5 g/dL (ref 30.0–36.0)
MCV: 82.6 fL (ref 78.0–100.0)
Platelets: 246 10*3/uL (ref 150–400)
RDW: 13.1 % (ref 11.5–15.5)
WBC: 12.5 10*3/uL — ABNORMAL HIGH (ref 4.0–10.5)

## 2012-09-18 LAB — POCT URINALYSIS DIP (DEVICE)
Bilirubin Urine: NEGATIVE
Ketones, ur: NEGATIVE mg/dL
Leukocytes, UA: NEGATIVE
Nitrite: NEGATIVE
Protein, ur: NEGATIVE mg/dL

## 2012-09-18 LAB — COMPREHENSIVE METABOLIC PANEL
ALT: 29 U/L (ref 0–35)
AST: 22 U/L (ref 0–37)
Albumin: 4.1 g/dL (ref 3.5–5.2)
Calcium: 9.4 mg/dL (ref 8.4–10.5)
Creatinine, Ser: 0.73 mg/dL (ref 0.50–1.10)
Sodium: 139 mEq/L (ref 135–145)
Total Protein: 7.4 g/dL (ref 6.0–8.3)

## 2012-09-18 MED ORDER — CIPROFLOXACIN HCL 500 MG PO TABS: 500.0000 mg | ORAL_TABLET | Freq: Two times a day (BID) | ORAL | Status: DC

## 2012-09-18 MED ORDER — SODIUM CHLORIDE 0.9 % IV BOLUS (SEPSIS)
1000.0000 mL | Freq: Once | INTRAVENOUS | Status: AC
  Administered 2012-09-18: 1000 mL via INTRAVENOUS

## 2012-09-18 MED ORDER — METRONIDAZOLE 500 MG PO TABS: 500.0000 mg | ORAL_TABLET | Freq: Two times a day (BID) | ORAL | Status: DC

## 2012-09-18 MED ORDER — IOHEXOL 300 MG/ML  SOLN
50.0000 mL | Freq: Once | INTRAMUSCULAR | Status: AC | PRN
  Administered 2012-09-18: 50 mL via ORAL

## 2012-09-18 MED ORDER — ONDANSETRON HCL 4 MG/2ML IJ SOLN
4.0000 mg | Freq: Once | INTRAMUSCULAR | Status: AC
  Administered 2012-09-18: 4 mg via INTRAVENOUS
  Filled 2012-09-18: qty 2

## 2012-09-18 MED ORDER — IOHEXOL 300 MG/ML  SOLN
100.0000 mL | Freq: Once | INTRAMUSCULAR | Status: AC | PRN
  Administered 2012-09-18: 100 mL via INTRAVENOUS

## 2012-09-18 NOTE — ED Notes (Signed)
Left sided abd pain since last night went to ucc today and was sent for further tests her sugar is up

## 2012-09-18 NOTE — ED Provider Notes (Signed)
Chief Complaint  Patient presents with  . Abdominal Pain    History of Present Illness:    Bridget [name] is a 52 year old female with diabetes who has a history since last night of severe left lower quadrant abdominal pain. This radiates through to the left lower back and to the right lower quadrant as well. It's a constant aching, cramping pain rated 10 over 10 in intensity. It's worse if she sits up or stands and better she lies flat. It's been accompanied by nausea but no vomiting. She denies fever, chills, constipation, diarrhea, blood in the stool, urinary symptoms, or GYN complaints. She is status post hysterectomy but still has both ovaries. She has a history of diverticulosis.  Review of Systems:  Other than noted above, the patient denies any of the following symptoms: Constitutional:  No fever, chills, fatigue, weight loss or anorexia. Lungs:  No cough or shortness of breath. Heart:  No chest pain, palpitations, syncope or edema.  No cardiac history. Abdomen:  No nausea, vomiting, hematememesis, melena, diarrhea, or hematochezia. GU:  No dysuria, frequency, urgency, or hematuria. Gyn:  No vaginal discharge, itching, abnormal bleeding, dyspareunia, or pelvic pain.  PMFSH:  Past medical history, family history, social history, meds, and allergies were reviewed along with nurse's notes.  No prior abdominal surgeries, past history of GI problems, STDs or GYN problems.  No history of aspirin or NSAID use.  No excessive alcohol intake.  Physical Exam:   Vital signs:  BP 137/60  Pulse 80  Temp 98.3 F (36.8 C) (Oral)  Resp 18  Ht 5\' 5"  (1.651 m)  Wt 245 lb (111.131 kg)  BMI 40.77 kg/m2  SpO2 100% Gen:  Alert, oriented, in no distress. Lungs:  Breath sounds clear and equal bilaterally.  No wheezes, rales or rhonchi. Heart:  Regular rhythm.  No gallops or murmers.   Abdomen:  Soft, flat, nondistended. There is pain to palpation in the left lower quadrant without guarding or rebound.  No organomegaly or mass. Bowel sounds are diminished. Skin:  Clear, warm and dry.  No rash.  Labs:   Results for orders placed during the hospital encounter of 09/18/12  POCT URINALYSIS DIP (DEVICE)      Component Value Range   Glucose, UA 250 (*) NEGATIVE mg/dL   Bilirubin Urine NEGATIVE  NEGATIVE   Ketones, ur NEGATIVE  NEGATIVE mg/dL   Specific Gravity, Urine 1.025  1.005 - 1.030   Hgb urine dipstick TRACE (*) NEGATIVE   pH 5.5  5.0 - 8.0   Protein, ur NEGATIVE  NEGATIVE mg/dL   Urobilinogen, UA 1.0  0.0 - 1.0 mg/dL   Nitrite NEGATIVE  NEGATIVE   Leukocytes, UA NEGATIVE  NEGATIVE    Assessment:  The encounter diagnosis was Diverticulitis.  Plan:   1.  The following meds were prescribed:   New Prescriptions   No medications on file   2.  The patient was transferred to the emergency department via shuttle.   Reuben Likes, MD 09/18/12 [phone]

## 2012-09-18 NOTE — Telephone Encounter (Signed)
Tc to pt regarding msg.  Pt states starting last night has had severe lower abdominal cramping that she describes as severe.  Pt denies any abnl d/c/odor also had some nausea, unsure if she has a fever.  Pt has a hx of hysterectomy, pt requesting appt.  Pt informed next available appt on Monday.  Pt states can not wait that long, will go to an Urgent Care, advised to call back if they fell that she needs to be seen by [place], pt voices agreement.

## 2012-09-18 NOTE — ED Provider Notes (Signed)
History     CSN: [id number]  Arrival date & time 09/18/12  1356   First MD Initiated Contact with Patient 09/18/12 1745      No chief complaint on file.   (Consider location/radiation/quality/duration/timing/severity/associated sxs/prior treatment) HPI Pt with history of diverticulosis reports constant, aching cramping LLQ pain during the day today initially associated with nausea but no vomiting. No diarrhea, no dysuria. She was seen at [facility] and sent to the ED for evaluation. She is s/p hysterectomy.   Past Medical History  Diagnosis Date  . Diabetes mellitus without complication   . Osteoarthritis   . GERD (gastroesophageal reflux disease)     No past surgical history on file.  No family history on file.  History  Substance Use Topics  . Smoking status: Former Smoker -- 0.3 packs/day    Types: Cigarettes    Quit date: 08/29/1998  . Smokeless tobacco: Never Used     Comment: Quit in 2000   . Alcohol Use: Yes    OB History    Grav Para Term Preterm Abortions TAB SAB Ect Mult Living                  Review of Systems All other systems reviewed and are negative except as noted in HPI.   Allergies  Codeine; Metformin and related; Metoclopramide hcl; Penicillins; Propoxyphene-acetaminophen; and Sulfonamide derivatives  Home Medications   Current Outpatient Rx  Name  Route  Sig  Dispense  Refill  . ALCOHOL PREP PAD 70 % PADS   Does not apply   1 application by Does not apply route daily. Clean finger with wipe, get a drop of blood, wipe off the blood with a tissue and test the second drop.   100 each   3   . CYCLOBENZAPRINE HCL 5 MG PO TABS      TAKE 1 TABLET BY MOUTH 3 TIMES DAILY AS NEEDED FOR MUSCLE SPASMS   90 tablet   0   . CYMBALTA 20 MG PO CPEP      TAKE 1 CAPSULE BY MOUTH DAILY   30 capsule   3   . FLUCONAZOLE 150 MG PO TABS      TAKE 1 TABLET (150 MG TOTAL) BY MOUTH ONCE. TAKE ANOTHER PILL IN 4 DAYS IF NOT IMPROVED.   1 tablet   1   .  FUROSEMIDE 20 MG PO TABS      TAKE 1 TABLET BY MOUTH FOUR TIMES A WEEK   36 tablet   5   . GABAPENTIN 100 MG PO CAPS      TAKE 1 CAPSULE BY MOUTH TWICE DAILY AS NEEDED   60 capsule   0   . GLIMEPIRIDE 1 MG PO TABS      TAKE 3 TABLETS BY MOUTH DAILY BEFORE BREAKFAST   90 tablet   PRN   . GLUCOSAMINE-CHONDROIT-MSM-C-MN PO TABS   Oral   Take 1 tablet by mouth daily.   30 each   11   . GLUCOSE BLOOD VI STRP      Use as instructed   100 each   12   . JANUVIA 100 MG PO TABS      TAKE 1 TABLET BY MOUTH ONCE DAILY   30 tablet   PRN   . LEVOCETIRIZINE DIHYDROCHLORIDE 5 MG PO TABS   Oral   Take 5 mg by mouth at bedtime.           Marland Kitchen TART CHERRY ADVANCED PO  CAPS   Oral   Take 1 capsule by mouth daily.           . MORPHINE SULFATE ER 15 MG PO TB12   Oral   Take 1 tablet (15 mg total) by mouth 2 (two) times daily. For pain   60 tablet   0   . OMEGA 3 PO   Oral   Take 1 capsule by mouth daily.           Marland Kitchen OMEPRAZOLE 20 MG PO CPDR   Oral   Take 20 mg by mouth daily.           . TRIAMCINOLONE ACETONIDE 55 MCG/ACT NA INHA   Nasal   Place 2 sprays into the nose daily.   1 Inhaler   11   . TRUEPLUS LANCETS 33G MISC   Does not apply   100 kits by Does not apply route daily.   100 each   3     BP 126/70  Pulse 86  Temp 98.5 F (36.9 C)  Resp 16  SpO2 99%  Physical Exam  Nursing note and vitals reviewed. Constitutional: She is oriented to person, place, and time. She appears well-developed and well-nourished.  HENT:  Head: Normocephalic and atraumatic.  Eyes: EOM are normal. Pupils are equal, round, and reactive to light.  Neck: Normal range of motion. Neck supple.  Cardiovascular: Normal rate, normal heart sounds and intact distal pulses.   Pulmonary/Chest: Effort normal and breath sounds normal.  Abdominal: Bowel sounds are normal. She exhibits no distension. There is tenderness (LLQ). There is guarding. There is no rebound.    Musculoskeletal: Normal range of motion. She exhibits no edema and no tenderness.  Neurological: She is alert and oriented to person, place, and time. She has normal strength. No cranial nerve deficit or sensory deficit.  Skin: Skin is warm and dry. No rash noted.  Psychiatric: She has a normal mood and affect.    ED Course  Procedures (including critical care time)  Labs Reviewed  GLUCOSE, CAPILLARY - Abnormal; Notable for the following:    Glucose-Capillary 211 (*)     All other components within normal limits  CBC WITH DIFFERENTIAL - Abnormal; Notable for the following:    WBC 12.5 (*)     Neutro Abs 8.5 (*)     All other components within normal limits  COMPREHENSIVE METABOLIC PANEL - Abnormal; Notable for the following:    Glucose, Bld 184 (*)     All other components within normal limits   Dg Chest 2 View  09/18/2012  *RADIOLOGY REPORT*  Clinical Data: Left lower quadrant pain and nausea.  CHEST - 2 VIEW  Comparison: None.  Findings: Two views of the chest demonstrate clear lungs. Heart and mediastinum are within normal limits.  Bony thorax is intact. Trachea is midline.  IMPRESSION: No acute cardiopulmonary disease.   Original Report Authenticated By: Richarda Overlie, M.D.    Ct Abdomen Pelvis W Contrast  09/18/2012  *RADIOLOGY REPORT*  Clinical Data: 52 year old female with left abdominal and pelvic pain. Nausea.  CT ABDOMEN AND PELVIS WITH CONTRAST  Technique:  Multidetector CT imaging of the abdomen and pelvis was performed following the standard protocol during bolus administration of intravenous contrast.  Contrast: OMNIPAQUE IOHEXOL 300 MG/ML  SOLN  Comparison: None  Findings: A 7 mm noncalcified left lower lobe pulmonary nodule (image 11) is identified.  Probable mild fatty infiltration the liver is noted. The spleen, gallbladder, adrenal glands, kidneys  and pancreas are unremarkable.  Wall thickening of the mid sigmoid colon with adjacent inflammation is identified with  small amount of free fluid within the pelvis - likely representing diverticulitis.  There is no evidence of focal abscess or pneumoperitoneum.  There is no evidence of biliary dilatation, enlarged lymph nodes, or abdominal aortic aneurysm. No other bowel abnormalities are identified. The bladder is within normal limits.  No acute or suspicious bony abnormalities are present.  IMPRESSION: Mid sigmoid colon wall thickening with adjacent inflammation and small amount of free pelvic fluid - likely representing diverticulitis.  No evidence of focal abscess or pneumoperitoneum. Clinical or imaging follow-up is recommended to exclude other processes.  7 mm left lower lobe pulmonary nodule.  If the patient is at high risk for bronchogenic carcinoma, follow-up chest CT at 3-6 months is recommended.  If the patient is at low risk for bronchogenic carcinoma, follow-up chest CT at 6-12 months is recommended.  This recommendation follows the consensus statement: Guidelines for Management of Small Pulmonary Nodules Detected on CT Scans: A Statement from the Fleischner Society as published in Radiology 2005; 237:395-400.  Probable mild fatty infiltration of the liver.   Original Report Authenticated By: Harmon Pier, M.D.      No diagnosis found.    MDM  Suspect diverticulitis. Will send for CT. Pt declines pain meds at this time.   10:01 PM Pt with diverticulitis as above. Also informed of nodule. Will begin home oral Abx and PCP follow up. Advised to return for any worsening or fever.      Charles B. Bernette Mayers, MD 09/18/12 2203

## 2012-09-18 NOTE — ED Notes (Signed)
Pt is here with complaints of lower abdominal pain and cramping with onset last night.  Pt also reports nausea, but denies vomiting.

## 2012-09-21 ENCOUNTER — Ambulatory Visit (INDEPENDENT_AMBULATORY_CARE_PROVIDER_SITE_OTHER): Payer: 59 | Admitting: Family Medicine

## 2012-09-21 ENCOUNTER — Encounter: Payer: Self-pay | Admitting: Family Medicine

## 2012-09-21 VITALS — BP 148/68 | HR 75 | Temp 98.1°F | Ht 66.0 in | Wt 237.4 lb

## 2012-09-21 DIAGNOSIS — K5792 Diverticulitis of intestine, part unspecified, without perforation or abscess without bleeding: Secondary | ICD-10-CM | POA: Insufficient documentation

## 2012-09-21 DIAGNOSIS — K5732 Diverticulitis of large intestine without perforation or abscess without bleeding: Secondary | ICD-10-CM

## 2012-09-21 DIAGNOSIS — R911 Solitary pulmonary nodule: Secondary | ICD-10-CM | POA: Insufficient documentation

## 2012-09-21 NOTE — Progress Notes (Signed)
  Subjective:    Patient ID: Bridget [name], female    DOB: 07-27-1961, 52 y.o.   MRN: [id number]  HPI  Abdominal pain: Patient presents to clinic for ED follow up.  Was seen on 1/21 in the ED for abdominal cramping and nausea.  Found to have diverticulitis.  No fever, chills, or vomiting.  No bloody stools.  No constipation.  Was given Rx for Cipro and Flagyl.  She was diagnosed with diverticulosis in 2004-2005 and has been seen at [facility] one time.  Patient feeling better now.  Abdominal cramps are improving.  She has no other complaints.  Lung nodule incidental finding on CT abdomen:  Former smoker.  Smoked cigarettes for over 10-15 years, about 1/2 ppd.  Per radiology, repeat CT in 3-6 months.  Patient denies any coughing, chest pain, dyspnea, or unintentional weight loss at this time.  Review of Systems  Per HPI    Objective:   Physical Exam  Constitutional: She appears well-nourished. No distress.  Cardiovascular: Normal rate, regular rhythm and normal heart sounds.   Pulmonary/Chest: Effort normal and breath sounds normal.  Abdominal: Soft. Bowel sounds are normal. She exhibits no distension. There is no tenderness. There is no rebound and no guarding.  Musculoskeletal: She exhibits no edema.      Assessment & Plan:

## 2012-09-21 NOTE — Patient Instructions (Addendum)
It was good to see you today, Bridget [name]. I am glad you are feeling better. Continue to take ALL of your antibiotics until they run out. Schedule follow up appointment with [facility] to discuss next colonoscopy. Schedule follow up with me in 4 months, we will repeat CT chest at that time. Please return to clinic or ER if you develop vomiting, worsening abdominal pain, bloody stools, or persistent fevers.  High-Fiber Diet Fiber is found in fruits, vegetables, and grains. A high-fiber diet encourages the addition of more whole grains, legumes, fruits, and vegetables in your diet. The recommended amount of fiber for adult males is 38 g per day. For adult females, it is 25 g per day. Pregnant and lactating women should get 28 g of fiber per day. If you have a digestive or bowel problem, ask your caregiver for advice before adding high-fiber foods to your diet. Eat a variety of high-fiber foods instead of only a select few type of foods.  PURPOSE  To increase stool bulk.  To make bowel movements more regular to prevent constipation.  To lower cholesterol.  To prevent overeating. WHEN IS THIS DIET USED?  It may be used if you have constipation and hemorrhoids.  It may be used if you have uncomplicated diverticulosis (intestine condition) and irritable bowel syndrome.  It may be used if you need help with weight management.  It may be used if you want to add it to your diet as a protective measure against atherosclerosis, diabetes, and cancer. SOURCES OF FIBER  Whole-grain breads and cereals.  Fruits, such as apples, oranges, bananas, berries, prunes, and pears.  Vegetables, such as green peas, carrots, sweet potatoes, beets, broccoli, cabbage, spinach, and artichokes.  Legumes, such split peas, soy, lentils.  Almonds. FIBER CONTENT IN FOODS Starches and Grains / Dietary Fiber (g)  Cheerios, 1 cup / 3 g  Corn Flakes cereal, 1 cup / 0.7 g  Rice crispy treat cereal, 1 cup / 0.3  g  Instant oatmeal (cooked),  cup / 2 g  Frosted wheat cereal, 1 cup / 5.1 g  Brown, long-grain rice (cooked), 1 cup / 3.5 g  White, long-grain rice (cooked), 1 cup / 0.6 g  Enriched macaroni (cooked), 1 cup / 2.5 g Legumes / Dietary Fiber (g)  Baked beans (canned, plain, or vegetarian),  cup / 5.2 g  Kidney beans (canned),  cup / 6.8 g  Pinto beans (cooked),  cup / 5.5 g Breads and Crackers / Dietary Fiber (g)  Plain or honey graham crackers, 2 squares / 0.7 g  Saltine crackers, 3 squares / 0.3 g  Plain, salted pretzels, 10 pieces / 1.8 g  Whole-wheat bread, 1 slice / 1.9 g  White bread, 1 slice / 0.7 g  Raisin bread, 1 slice / 1.2 g  Plain bagel, 3 oz / 2 g  Flour tortilla, 1 oz / 0.9 g  Corn tortilla, 1 small / 1.5 g  Hamburger or hotdog bun, 1 small / 0.9 g Fruits / Dietary Fiber (g)  Apple with skin, 1 medium / 4.4 g  Sweetened applesauce,  cup / 1.5 g  Banana,  medium / 1.5 g  Grapes, 10 grapes / 0.4 g  Orange, 1 small / 2.3 g  Raisin, 1.5 oz / 1.6 g  Melon, 1 cup / 1.4 g Vegetables / Dietary Fiber (g)  Green beans (canned),  cup / 1.3 g  Carrots (cooked),  cup / 2.3 g  Broccoli (cooked),  cup /  2.8 g  Peas (cooked),  cup / 4.4 g  Mashed potatoes,  cup / 1.6 g  Lettuce, 1 cup / 0.5 g  Corn (canned),  cup / 1.6 g  Tomato,  cup / 1.1 g Document Released: 08/15/2005 Document Revised: 02/14/2012 Document Reviewed: 11/17/2011 [facility] Patient Information 2013 [place], [place].

## 2012-09-21 NOTE — Assessment & Plan Note (Signed)
Seen on CT abdomen 09/18/12.  Symptoms improving significantly with Cipro/Flagyl.  Denies any abdominal pain at this time.  Plan to finish course of antibiotics and encourage high fiber diet.  Handout given.  Red flags reviewed.  Encouraged patient to schedule F/U with GI since this is a new flare up and she is probably due for colonoscopy soon.

## 2012-09-21 NOTE — Assessment & Plan Note (Signed)
Lung nodule seen on CT 09/18/12 - radiology recommended followup CT in 3-6 months if clinic suspicion for lung CA.  Patient is a former smoker (1/2 ppd for > 10 years).  Denies any cough, weight loss, dyspnea at this time.  Plan to repeat CT in 4 months.

## 2012-09-26 ENCOUNTER — Ambulatory Visit: Payer: 59

## 2012-10-10 ENCOUNTER — Encounter: Payer: Self-pay | Admitting: Gastroenterology

## 2012-10-30 ENCOUNTER — Ambulatory Visit: Payer: 59

## 2012-10-30 ENCOUNTER — Encounter: Payer: Self-pay | Admitting: Gastroenterology

## 2012-10-30 ENCOUNTER — Ambulatory Visit (INDEPENDENT_AMBULATORY_CARE_PROVIDER_SITE_OTHER): Payer: 59 | Admitting: Gastroenterology

## 2012-10-30 VITALS — BP 130/78 | HR 88 | Ht 65.75 in | Wt 241.0 lb

## 2012-10-30 DIAGNOSIS — K5792 Diverticulitis of intestine, part unspecified, without perforation or abscess without bleeding: Secondary | ICD-10-CM

## 2012-10-30 DIAGNOSIS — R933 Abnormal findings on diagnostic imaging of other parts of digestive tract: Secondary | ICD-10-CM

## 2012-10-30 DIAGNOSIS — K5732 Diverticulitis of large intestine without perforation or abscess without bleeding: Secondary | ICD-10-CM

## 2012-10-30 MED ORDER — PEG-KCL-NACL-NASULF-NA ASC-C 100 G PO SOLR: 1.0000 | Freq: Once | ORAL | Status: DC

## 2012-10-30 NOTE — Patient Instructions (Addendum)
You will be set up for a colonoscopy for recent diverticulitis, abnormal imaging (LEC, moderate sedation). You do not need to change your diet to prevent diverticulitis. Losing weight is important for your overall health.                                              We are excited to introduce MyChart, a new best-in-class service that provides you online access to important information in your electronic medical record. We want to make it easier for you to view your health information - all in one secure location - when and where you need it. We expect MyChart will enhance the quality of care and service we provide.  When you register for MyChart, you can:    View your test results.    Request appointments and receive appointment reminders via email.    Request medication renewals.    View your medical history, allergies, medications and immunizations.    Communicate with your physician's office through a password-protected site.    Conveniently print information such as your medication lists.  To find out if MyChart is right for you, please talk to a member of our clinical staff today. We will gladly answer your questions about this free health and wellness tool.  If you are age 50 or older and want a member of your family to have access to your record, you must provide written consent by completing a proxy form available at our office. Please speak to our clinical staff about guidelines regarding accounts for patients younger than age 29.  As you activate your MyChart account and need any technical assistance, please call the MyChart technical support line at [phone]) or email your question to [email or web address]. If you email your question(s), please include your name, a return phone number and the best time to reach you.  If you have non-urgent health-related questions, you can send a message to our office through MyChart at [email or web address]. If you  have a medical emergency, call 911.  Thank you for using MyChart as your new health and wellness resource!   MyChart licensed from [employer],  [id number]. Patents Pending.

## 2012-10-30 NOTE — Progress Notes (Signed)
HPI: This is a   very pleasant 52 year old woman whom I last saw about 8 years ago at the time of a colonoscopy.  About 6 weeks ago Was having lower abd pains, predominantly left side.  Pressure, very low vaginally and also rectal area.    No fevers.   Did not notice any changes in her bowels around the time.  Took cipro, flagyl and felt better after 1 week.  IMPRESSION from 08/2012 CT scan abd/pelvis (ER visit):  Mid sigmoid colon wall thickening with adjacent inflammation and small amount of free pelvic fluid - likely representing diverticulitis. No evidence of focal abscess or pneumoperitoneum. Clinical or imaging follow-up is recommended to exclude other processes.  Labs 08/2012 (ER visit): WBC 12.5K, normal CMET except elevated glucose  Colonoscopy 07/2005 Christella Hartigan) done for rectal bleeding found left diverticulosis, o/w was normal.  Recommended recall colonoscopy in 10 years.  Overall stable weight.  NO overt gi bleeding.  No colon cancer in her family.    Review of systems: Pertinent positive and negative review of systems were noted in the above HPI section. Complete review of systems was performed and was otherwise normal.    Past Medical History  Diagnosis Date  . Diabetes mellitus without complication   . Osteoarthritis   . GERD (gastroesophageal reflux disease)   . Diverticulosis   . Glaucoma   . Obesity     Past Surgical History  Procedure Laterality Date  . Abdominal hysterectomy  1990  . Bladder surgery  2001  . Cataract extraction Right 2001    with implant    Current Outpatient Prescriptions  Medication Sig Dispense Refill  . ciprofloxacin (CIPRO) 500 MG tablet Take 1 tablet (500 mg total) by mouth 2 (two) times daily.  28 tablet  0  . CYMBALTA 20 MG capsule TAKE 1 CAPSULE BY MOUTH DAILY  30 capsule  3  . fluconazole (DIFLUCAN) 150 MG tablet TAKE 1 TABLET (150 MG TOTAL) BY MOUTH ONCE. TAKE ANOTHER PILL IN 4 DAYS IF NOT IMPROVED.  1 tablet  1  . furosemide  (LASIX) 20 MG tablet Take 20 mg by mouth daily. On Tuesday Thursday Saturday and Sunday      . gabapentin (NEURONTIN) 100 MG capsule TAKE 1 CAPSULE BY MOUTH TWICE DAILY AS NEEDED  60 capsule  0  . glimepiride (AMARYL) 1 MG tablet TAKE 3 TABLETS BY MOUTH DAILY BEFORE BREAKFAST  90 tablet  PRN  . glucose blood (TRUETEST TEST) test strip Use as instructed  100 each  12  . ibuprofen (ADVIL,MOTRIN) 200 MG tablet Take 400 mg by mouth every 6 (six) hours as needed. For pain      . JANUVIA 100 MG tablet TAKE 1 TABLET BY MOUTH ONCE DAILY  30 tablet  PRN  . levocetirizine (XYZAL) 5 MG tablet Take 5 mg by mouth at bedtime.        . metroNIDAZOLE (FLAGYL) 500 MG tablet Take 1 tablet (500 mg total) by mouth 2 (two) times daily.  28 tablet  0   No current facility-administered medications for this visit.    Allergies as of 10/30/2012 - Review Complete 10/30/2012  Allergen Reaction Noted  . Citrus Itching 09/18/2012  . Codeine Hives 01/23/2009  . Metformin and related Diarrhea and Nausea And Vomiting 07/12/2011  . Metoclopramide hcl Nausea And Vomiting 01/23/2009  . Penicillins Nausea And Vomiting 01/23/2009  . Propoxyphene-acetaminophen Nausea And Vomiting 01/23/2009  . Sulfonamide derivatives Hives 01/23/2009    Family History  Problem  Relation Age of Onset  . Breast cancer Mother   . Ovarian cancer Maternal Grandmother   . Stomach cancer Cousin   . Diabetes Mother   . Heart disease Maternal Grandmother     great  . Diabetes Brother     x 3    History   Social History  . Marital Status: Divorced    Spouse Name: N/A    Number of Children: 2  . Years of Education: N/A   Occupational History  . PBX operator [employer]   Social History Main Topics  . Smoking status: Former Smoker -- 0.30 packs/day    Types: Cigarettes    Quit date: 08/29/1998  . Smokeless tobacco: Never Used     Comment: Quit in 2000   . Alcohol Use: Yes     Comment: 1 per week  . Drug Use: No  . Sexually  Active: Not on file   Other Topics Concern  . Not on file   Social History Narrative  . No narrative on file       Physical Exam: BP 130/78  Pulse 88  Ht 5' 5.75" (1.67 m)  Wt 241 lb (109.317 kg)  BMI 39.2 kg/m2 Constitutional: generally well-appearing, obese Psychiatric: alert and oriented x3 Eyes: extraocular movements intact Mouth: oral pharynx moist, no lesions Neck: supple no lymphadenopathy Cardiovascular: heart regular rate and rhythm Lungs: clear to auscultation bilaterally Abdomen: soft, nontender, nondistended, no obvious ascites, no peritoneal signs, normal bowel sounds Extremities: no lower extremity edema bilaterally Skin: no lesions on visible extremities    Assessment and plan: 52 y.o. female with  recent left sided diverticulitis  I did notice left-sided diverticulosis during her colonoscopy about 8 years ago. She has never had an attack of diverticulitis and we went over the nature of diverticulitis some back. She knows she did not need to avoid seeds or nuts or berries in her diet. I recommended she have a repeat colonoscopy since the interval about 8 years and significant things could change in the interval. We will proceed with that at her soonest convenience.

## 2012-11-07 ENCOUNTER — Telehealth: Payer: Self-pay | Admitting: Family Medicine

## 2012-11-07 NOTE — Telephone Encounter (Signed)
Received fax.  Patient scheduled to see me this month to discuss DM.

## 2012-11-07 NOTE — Telephone Encounter (Signed)
Link to Wellness Nurse called to let Dr. Tye Savoy know that she has suggested the patient make an appt which she has done.  The BP was 120/80, fasting CBG 294, POC a1c this morning was 8.7, estimated average glucose is at 200+.  She is supposed to me taking 3mg  of Ameril and has done it for the last month but it hasn't helped.  She has discussed the patient getting on some long acting insulin and the patient is ok.

## 2012-11-14 ENCOUNTER — Ambulatory Visit (INDEPENDENT_AMBULATORY_CARE_PROVIDER_SITE_OTHER): Payer: 59 | Admitting: Family Medicine

## 2012-11-14 ENCOUNTER — Encounter: Payer: Self-pay | Admitting: Family Medicine

## 2012-11-14 VITALS — BP 135/70 | HR 83 | Temp 98.1°F | Ht 65.75 in | Wt 240.1 lb

## 2012-11-14 DIAGNOSIS — M7730 Calcaneal spur, unspecified foot: Secondary | ICD-10-CM

## 2012-11-14 DIAGNOSIS — IMO0001 Reserved for inherently not codable concepts without codable children: Secondary | ICD-10-CM

## 2012-11-14 DIAGNOSIS — E1149 Type 2 diabetes mellitus with other diabetic neurological complication: Secondary | ICD-10-CM

## 2012-11-14 DIAGNOSIS — E1140 Type 2 diabetes mellitus with diabetic neuropathy, unspecified: Secondary | ICD-10-CM

## 2012-11-14 DIAGNOSIS — E1142 Type 2 diabetes mellitus with diabetic polyneuropathy: Secondary | ICD-10-CM

## 2012-11-14 MED ORDER — GLIMEPIRIDE 1 MG PO TABS: ORAL_TABLET | ORAL | Status: DC

## 2012-11-14 MED ORDER — GABAPENTIN 300 MG PO CAPS: 300.0000 mg | ORAL_CAPSULE | Freq: Three times a day (TID) | ORAL | Status: DC

## 2012-11-14 MED ORDER — INSULIN GLARGINE 100 UNITS/ML SOLOSTAR PEN: 10.0000 [IU] | PEN_INJECTOR | Freq: Every day | SUBCUTANEOUS | Status: DC

## 2012-11-14 NOTE — Assessment & Plan Note (Signed)
She was on a low dose of Gabapentin.  Will increase to 300 two to three times per day and follow up in 3 months or PRN.

## 2012-11-14 NOTE — Assessment & Plan Note (Addendum)
Gradual increase in HgBA1c.  Discussed starting Lantus today.  She is agreeable.  Will start with Lantus 10 SQ daily.  Patient will pick up Rx and take it to her health coach for teaching.  Continue Amaryl and Januvia for now, but will likely discontinue these at next visit and start SSI.  Follow up in 3 months or sooner as needed.

## 2012-11-14 NOTE — Assessment & Plan Note (Signed)
Heel spur and plantar fasciitis pain controlled with shoe insoles, however this may be causing metatarsalgia.  Advised her to purchase metatarsal pads or follow up with foot doctor since she has seen one in the past.

## 2012-11-14 NOTE — Patient Instructions (Addendum)
Start Lantus 10 units once per day.  Schedule appointment with Marylu Lund for education. Please purchase Metatarsal pads over the counter or schedule follow appointment with Foot Doctor. I sent a higher dose of Gabapentin to your pharmacy.  Take one tablet 2-3 times daily for diabetic neuropathy. Schedule follow up appointment with me in 3 months for diabetes check.  Dr. Kerin Perna [facility] Address: [address] [place], [place] [address] Phone: [phone]

## 2012-11-14 NOTE — Progress Notes (Signed)
  Subjective:     Bridget [name] is a 52 y.o. female who presents for follow up of diabetes.. Current symptoms include: hyperglycemia and paresthesia of the feet. Patient denies foot ulcerations, hypoglycemia , nausea, visual disturbances and vomiting. Evaluation to date has been: hemoglobin A1C and checks CBG 1-2 times per day.  Home sugars: BGs range between 130 and 246. Current treatments: Amaryl and Januvia. Last dilated eye exam: last year.  Her health coach discussed starting insulin since last a1C was 8.7.  Still complains of burning sensation of lower extremities.  She takes Gabapentin which does not seem to be helping.  She also complains of foot pain, bilateral.  She has a hx of plantar fasciitis, used to go to a foot doctor and wears shoe insoles which do not seem to be helping.  Review of Systems Per HPI   Objective:   Filed Vitals:   11/14/12 0914  BP: 135/70  Pulse: 83  Temp: 98.1 F (36.7 C)   General appearance: alert, cooperative and no distress Eyes: conjunctivae/corneas clear. PERRL, EOM's intact. Fundi benign. Lungs: clear to auscultation bilaterally Heart: regular rate and rhythm, S1, S2 normal, no murmur, click, rub or gallop Abdomen: soft, non-tender; bowel sounds normal; no masses,  no organomegaly Skin: Skin color, texture, turgor normal. No rashes or lesions   Diabetic foot exam: normal, sensation intact  Assessment:    Diabetes mellitus Type II, under poor control.    Plan:   See Problem List

## 2012-11-28 ENCOUNTER — Ambulatory Visit
Admission: RE | Admit: 2012-11-28 | Discharge: 2012-11-28 | Disposition: A | Discharge status: Home / Self Care | Payer: 59 | Source: Ambulatory Visit | Attending: Family Medicine | Admitting: Family Medicine

## 2012-11-28 DIAGNOSIS — Z1231 Encounter for screening mammogram for malignant neoplasm of breast: Secondary | ICD-10-CM

## 2012-12-13 ENCOUNTER — Other Ambulatory Visit: Payer: Self-pay | Admitting: Family Medicine

## 2012-12-19 ENCOUNTER — Other Ambulatory Visit: Payer: Self-pay | Admitting: Gastroenterology

## 2012-12-19 ENCOUNTER — Ambulatory Visit (AMBULATORY_SURGERY_CENTER): Payer: 59 | Admitting: Gastroenterology

## 2012-12-19 ENCOUNTER — Encounter: Payer: Self-pay | Admitting: Gastroenterology

## 2012-12-19 VITALS — BP 133/72 | HR 78 | Temp 97.3°F | Resp 17 | Ht 65.0 in | Wt 235.0 lb

## 2012-12-19 DIAGNOSIS — R933 Abnormal findings on diagnostic imaging of other parts of digestive tract: Secondary | ICD-10-CM

## 2012-12-19 DIAGNOSIS — K5732 Diverticulitis of large intestine without perforation or abscess without bleeding: Secondary | ICD-10-CM

## 2012-12-19 DIAGNOSIS — K5730 Diverticulosis of large intestine without perforation or abscess without bleeding: Secondary | ICD-10-CM

## 2012-12-19 LAB — GLUCOSE, CAPILLARY
Glucose-Capillary: 148 mg/dL — ABNORMAL HIGH (ref 70–99)
Glucose-Capillary: 120 mg/dL — ABNORMAL HIGH (ref 70–99)

## 2012-12-19 MED ORDER — SODIUM CHLORIDE 0.9 % IV SOLN: 500.0000 mL | INTRAVENOUS | Status: DC

## 2012-12-19 NOTE — Progress Notes (Signed)
Patient did not experience any of the following events: a burn prior to discharge; a fall within the facility; wrong site/side/patient/procedure/implant event; or a hospital transfer or hospital admission upon discharge from the facility. (G8907) Patient did not have preoperative order for IV antibiotic SSI prophylaxis. (G8918)  

## 2012-12-19 NOTE — Patient Instructions (Signed)
Diverticulosis seen today. Handout given on Diverticulosis. Resume current medications. Repeat colonoscopy in 10 years. Call us if you have repeated left sided abdominal attacks. Call us with any questions or concerns. Thank you!  YOU HAD AN ENDOSCOPIC PROCEDURE TODAY AT THE  ENDOSCOPY CENTER: Refer to the procedure report that was given to you for any specific questions about what was found during the examination.  If the procedure report does not answer your questions, please call your gastroenterologist to clarify.  If you requested that your care partner not be given the details of your procedure findings, then the procedure report has been included in a sealed envelope for you to review at your convenience later.  YOU SHOULD EXPECT: Some feelings of bloating in the abdomen. Passage of more gas than usual.  Walking can help get rid of the air that was put into your GI tract during the procedure and reduce the bloating. If you had a lower endoscopy (such as a colonoscopy or flexible sigmoidoscopy) you may notice spotting of blood in your stool or on the toilet paper. If you underwent a bowel prep for your procedure, then you may not have a normal bowel movement for a few days.  DIET: Your first meal following the procedure should be a light meal and then it is ok to progress to your normal diet.  A half-sandwich or bowl of soup is an example of a good first meal.  Heavy or fried foods are harder to digest and may make you feel nauseous or bloated.  Likewise meals heavy in dairy and vegetables can cause extra gas to form and this can also increase the bloating.  Drink plenty of fluids but you should avoid alcoholic beverages for 24 hours.  ACTIVITY: Your care partner should take you home directly after the procedure.  You should plan to take it easy, moving slowly for the rest of the day.  You can resume normal activity the day after the procedure however you should NOT DRIVE or use heavy machinery  for 24 hours (because of the sedation medicines used during the test).    SYMPTOMS TO REPORT IMMEDIATELY: A gastroenterologist can be reached at any hour.  During normal business hours, 8:30 AM to 5:00 PM Monday through Friday, call [phone].  After hours and on weekends, please call the GI answering service at [phone] who will take a message and have the physician on call contact you.   Following lower endoscopy (colonoscopy or flexible sigmoidoscopy):  Excessive amounts of blood in the stool  Significant tenderness or worsening of abdominal pains  Swelling of the abdomen that is new, acute  Fever of 100F or higher  Following upper endoscopy (EGD)  Vomiting of blood or coffee ground material  New chest pain or pain under the shoulder blades  Painful or persistently difficult swallowing  New shortness of breath  Fever of 100F or higher  Black, tarry-looking stools  FOLLOW UP: If any biopsies were taken you will be contacted by phone or by letter within the next 1-3 weeks.  Call your gastroenterologist if you have not heard about the biopsies in 3 weeks.  Our staff will call the home number listed on your records the next business day following your procedure to check on you and address any questions or concerns that you may have at that time regarding the information given to you following your procedure. This is a courtesy call and so if there is no answer at the  home number and we have not heard from you through the emergency physician on call, we will assume that you have returned to your regular daily activities without incident.  SIGNATURES/CONFIDENTIALITY: You and/or your care partner have signed paperwork which will be entered into your electronic medical record.  These signatures attest to the fact that that the information above on your After Visit Summary has been reviewed and is understood.  Full responsibility of the confidentiality of this discharge information  lies with you and/or your care-partner.

## 2012-12-19 NOTE — Op Note (Signed)
[facility] [address] [place], [address]   COLONOSCOPY PROCEDURE REPORT  PATIENT: Bridget [name], Bridget [name]  MR#: [id number] BIRTHDATE: [birth year] , 52  yrs. old GENDER: Female ENDOSCOPIST: Rachael Fee, MD PROCEDURE DATE:  12/19/2012 PROCEDURE:   Colonoscopy, diagnostic ASA CLASS:   Class II INDICATIONS:colonoscopy 2006 without polyps, recent abnormal CT scan (left sided colon thickening, diverticulitis?). MEDICATIONS: Fentanyl 75 mcg IV, Versed 6 mg IV, and These medications were titrated to patient response per physician's verbal order  DESCRIPTION OF PROCEDURE:   After the risks benefits and alternatives of the procedure were thoroughly explained, informed consent was obtained.  A digital rectal exam revealed no abnormalities of the rectum.   The Pentax Pediatric Colonoscope [phone]  endoscope was introduced through the anus and advanced to the cecum, which was identified by both the appendix and ileocecal valve. No adverse events experienced.   The quality of the prep was good.  The instrument was then slowly withdrawn as the colon was fully examined.   COLON FINDINGS: There were several small to medium sized diverticulum in left colon.  The examination was otherwise normal. Retroflexed views revealed no abnormalities. The time to cecum=2 minutes 38 seconds.  Withdrawal time=6 minutes 13 seconds.  The scope was withdrawn and the procedure completed. COMPLICATIONS: There were no complications.  ENDOSCOPIC IMPRESSION: There were several small to medium sized diverticulum in left colon.  The examination was otherwise normal. No polyps or cancers  RECOMMENDATIONS: You should continue to follow colorectal cancer screening guidelines for "routine risk" patients with a repeat colonoscopy in 10 years. There is no need for FOBT (stool) testing for at least 5 years. Please call if you have repeated left sided abdominal pains, diverticulitis  attacks   eSigned:  Rachael Fee, MD 12/19/2012 9:56 AM

## 2012-12-20 ENCOUNTER — Telehealth: Payer: Self-pay | Admitting: *Deleted

## 2012-12-20 NOTE — Telephone Encounter (Signed)
  Follow up Call-  Call back number 12/19/2012  Post procedure Call Back phone  # [phone]  Permission to leave phone message Yes     Patient questions:  Do you have a fever, pain , or abdominal swelling? no Pain Score  0 *  Have you tolerated food without any problems? yes  Have you been able to return to your normal activities? yes  Do you have any questions about your discharge instructions: Diet   no Medications  no Follow up visit  no  Do you have questions or concerns about your Care? no  Actions: * If pain score is 4 or above: No action needed, pain <4.

## 2012-12-24 ENCOUNTER — Other Ambulatory Visit: Payer: Self-pay | Admitting: *Deleted

## 2012-12-24 ENCOUNTER — Ambulatory Visit (INDEPENDENT_AMBULATORY_CARE_PROVIDER_SITE_OTHER): Payer: 59 | Admitting: Family Medicine

## 2012-12-24 ENCOUNTER — Encounter: Payer: Self-pay | Admitting: Family Medicine

## 2012-12-24 VITALS — BP 126/74 | HR 94 | Temp 99.8°F | Ht 65.0 in | Wt 236.0 lb

## 2012-12-24 DIAGNOSIS — J209 Acute bronchitis, unspecified: Secondary | ICD-10-CM

## 2012-12-24 MED ORDER — CETIRIZINE HCL 10 MG PO TABS: 10.0000 mg | ORAL_TABLET | Freq: Every day | ORAL | Status: AC

## 2012-12-24 MED ORDER — AZITHROMYCIN 500 MG PO TABS: 500.0000 mg | ORAL_TABLET | Freq: Every day | ORAL | Status: DC

## 2012-12-24 MED ORDER — LEVOCETIRIZINE DIHYDROCHLORIDE 5 MG PO TABS: 5.0000 mg | ORAL_TABLET | Freq: Every day | ORAL | Status: DC

## 2012-12-24 NOTE — Assessment & Plan Note (Signed)
Symptoms likely due to allergic rhinitis vs. acute bronchitis.  Will treat both - Azithromycin x 5 days and Zyrtec daily and rest/hydration.  Patient is afebrile and non-toxic appearing.  If symptoms worsen or persist after one week, advised patient to return to clinic.

## 2012-12-24 NOTE — Progress Notes (Signed)
  Subjective:    Patient ID: Bridget [name], female    DOB: 06-Jan-1961, 52 y.o.   MRN: [id number]  HPI  Patient presents to same day appointment for URI. Symptoms: nasal congestion, post-nasal drip, HA,  cough, chest tightness after coughing spells. Symptoms started about 1.5 weeks ago and are getting worse. Patient says she wants to get better before the Employee Award Ceremony. She has tried taking Xyzal last week, but no improvement. + subjective fevers at home, fatigue, and general malaise. Has not tried any OTC cold remedies. Has a good appetite, keeping food and drink down.  No emesis.  Review of Systems Per HPI    Objective:   Physical Exam  Constitutional: She appears well-nourished.  HENT:  Head: Normocephalic and atraumatic.  Nose: Nose normal.  Mild erythema oropharynx, but no exudate  Eyes: Conjunctivae are normal. No scleral icterus.  Neck: Normal range of motion.  Cardiovascular: Normal rate and regular rhythm.   Pulmonary/Chest: Effort normal and breath sounds normal.  Skin: No rash noted.      Assessment & Plan:

## 2013-02-06 ENCOUNTER — Other Ambulatory Visit: Payer: Self-pay | Admitting: Family Medicine

## 2013-02-07 ENCOUNTER — Ambulatory Visit (INDEPENDENT_AMBULATORY_CARE_PROVIDER_SITE_OTHER): Payer: 59 | Admitting: Family Medicine

## 2013-02-07 ENCOUNTER — Other Ambulatory Visit: Payer: Self-pay | Admitting: Family Medicine

## 2013-02-07 ENCOUNTER — Encounter: Payer: Self-pay | Admitting: Family Medicine

## 2013-02-07 VITALS — BP 139/56 | HR 85 | Temp 99.1°F | Ht 65.0 in | Wt 239.0 lb

## 2013-02-07 DIAGNOSIS — IMO0001 Reserved for inherently not codable concepts without codable children: Secondary | ICD-10-CM

## 2013-02-07 LAB — POCT GLYCOSYLATED HEMOGLOBIN (HGB A1C): Hemoglobin A1C: 8.4

## 2013-02-07 NOTE — Progress Notes (Signed)
ERRONEOUS ENCOUNTER, BAJ

## 2013-02-07 NOTE — Progress Notes (Signed)
Entered A1c done at [facility] Med Bridget [name], MLS

## 2013-02-07 NOTE — Progress Notes (Signed)
A1c done at [facility] LInk BAJORDAN, MLS

## 2013-02-07 NOTE — Patient Instructions (Addendum)
Continue your current medications for now. Goals from today's visit: WALK 30 minutes per day and EAT a LOW CARB diet. I know you can lose weight, Beda! Schedule follow up visit with your new doctor in 3 months or sooner as needed.  Diet Recommendations for Diabetes   Starchy (carb) foods include: Bread, rice, pasta, potatoes, corn, crackers, bagels, muffins, all baked goods.   Protein foods include: Meat, fish, poultry, eggs, dairy foods, and beans such as pinto and kidney beans (beans also provide carbohydrate).   1. Eat at least 3 meals and 1-2 snacks per day. Never go more than 4-5 hours while awake without eating.  2. Limit starchy foods to TWO per meal and ONE per snack. ONE portion of a starchy  food is equal to the following:   - ONE slice of bread (or its equivalent, such as half of a hamburger bun).   - 1/2 cup of a "scoopable" starchy food such as potatoes or rice.   - 1 OUNCE (28 grams) of starchy snack foods such as crackers or pretzels (look on label).   - 15 grams of carbohydrate as shown on food label.  3. Both lunch and dinner should include a protein food, a carb food, and vegetables.   - Obtain twice as many veg's as protein or carbohydrate foods for both lunch and dinner.   - Try to keep frozen veg's on hand for a quick vegetable serving.     - Fresh or frozen veg's are best.  4. Breakfast should always include protein.

## 2013-02-07 NOTE — Progress Notes (Signed)
  Subjective:     Bridget [name] is a 52 y.o. female who presents for follow up of diabetes.  Current symptoms include: none. Patient denies foot ulcerations, nausea, paresthesia of the feet, visual disturbances and vomiting. Evaluation to date has been: fasting blood sugar and hemoglobin A1C. Home sugars: BGs range between 94 and 264. Current treatments: Januvia once daily and Lantus 10 units daily. Last dilated eye exam: last year.  She says eats what she can afford.  She tries to eat healthy, but does snack on potato chips, french fries, and regular desserts (not sugar free).  She does not exercise.  Review of Systems Per HPI    Objective:    BP 139/56  Pulse 85  Temp(Src) 99.1 F (37.3 C) (Oral)  Ht 5\' 5"  (1.651 m)  Wt 239 lb (108.41 kg)  BMI 39.77 kg/m2 General: in no acute distress Neck: supples Heart: RRR Lungs: CTAB, normal effort Skin: normal monofilament exam, no open wounds or ulcers   Assessment:    Diabetes mellitus Type II, under poor control.    Plan:    See Problem List

## 2013-02-07 NOTE — Assessment & Plan Note (Signed)
A1c has been steady in the low 8 range.  Most recent level was 8.0.  I think this mostly related to diet non-compliance.  She takes her medications correctly, but eats what she wants.  I strongly encouraged better, healthier food choices and starting to walk/exercise for 30 minutes per day.  If she can lose some weight, CBG will be better controlled.  She understands this, but does not seem motivated.  Gave her contact info. For Dr. Gerilyn Pilgrim.  Follow up in 3 months for next DM check up.

## 2013-02-11 ENCOUNTER — Encounter: Payer: Self-pay | Admitting: Family Medicine

## 2013-02-11 ENCOUNTER — Other Ambulatory Visit: Payer: Self-pay | Admitting: Family Medicine

## 2013-02-11 ENCOUNTER — Other Ambulatory Visit: Payer: Self-pay | Admitting: *Deleted

## 2013-02-11 MED ORDER — FLUCONAZOLE 150 MG PO TABS: 150.0000 mg | ORAL_TABLET | Freq: Once | ORAL | Status: DC

## 2013-02-11 NOTE — Telephone Encounter (Signed)
Pt dropped off paperwork to be filled out regarding FMLA

## 2013-02-11 NOTE — Addendum Note (Signed)
Addended by: Damita Lack on: 02/11/2013 02:05 PM   Modules accepted: Orders

## 2013-02-11 NOTE — Telephone Encounter (Signed)
Pt dropped off paperwork to be filled out regarding FMLA °

## 2013-02-11 NOTE — Telephone Encounter (Signed)
I will send in Diflucan today.    I have not received the [facility] paperwork yet.  I will be away from the office next week but will work on it when I return.  Please inform patient.  Thanks.

## 2013-02-11 NOTE — Telephone Encounter (Signed)
Pt has dropping off paperwork to be filled out concerning FMLA.

## 2013-02-11 NOTE — Telephone Encounter (Signed)
FMLA forms placed in Dr. Sherron Flemings Cruz's box for completion.  Bridget [name]

## 2013-02-22 ENCOUNTER — Telehealth: Payer: Self-pay | Admitting: Family Medicine

## 2013-02-22 NOTE — Telephone Encounter (Signed)
Pt notified.  Auri Jahnke L, CMA  

## 2013-02-22 NOTE — Telephone Encounter (Signed)
FMLA form completed and ready for pick up.  I apologize for the delay.  I was out of town last week.  Please inform patient.

## 2013-03-07 ENCOUNTER — Other Ambulatory Visit: Payer: Self-pay

## 2013-03-11 ENCOUNTER — Encounter: Payer: Self-pay | Admitting: Sports Medicine

## 2013-03-15 ENCOUNTER — Other Ambulatory Visit: Payer: Self-pay | Admitting: Family Medicine

## 2013-04-08 ENCOUNTER — Other Ambulatory Visit: Payer: Self-pay | Admitting: Family Medicine

## 2013-04-09 ENCOUNTER — Other Ambulatory Visit: Payer: Self-pay | Admitting: Family Medicine

## 2013-05-10 ENCOUNTER — Encounter: Payer: Self-pay | Admitting: Sports Medicine

## 2013-05-10 ENCOUNTER — Ambulatory Visit (INDEPENDENT_AMBULATORY_CARE_PROVIDER_SITE_OTHER): Payer: 59 | Admitting: Sports Medicine

## 2013-05-10 VITALS — BP 146/83 | HR 90 | Temp 98.7°F | Ht 65.0 in | Wt 238.5 lb

## 2013-05-10 DIAGNOSIS — E669 Obesity, unspecified: Secondary | ICD-10-CM

## 2013-05-10 DIAGNOSIS — M1710 Unilateral primary osteoarthritis, unspecified knee: Secondary | ICD-10-CM

## 2013-05-10 DIAGNOSIS — IMO0002 Reserved for concepts with insufficient information to code with codable children: Secondary | ICD-10-CM

## 2013-05-10 DIAGNOSIS — I10 Essential (primary) hypertension: Secondary | ICD-10-CM

## 2013-05-10 DIAGNOSIS — E039 Hypothyroidism, unspecified: Secondary | ICD-10-CM

## 2013-05-10 DIAGNOSIS — IMO0001 Reserved for inherently not codable concepts without codable children: Secondary | ICD-10-CM

## 2013-05-10 LAB — POCT GLYCOSYLATED HEMOGLOBIN (HGB A1C): Hemoglobin A1C: 7.3

## 2013-05-10 MED ORDER — ESTRADIOL 10 MCG VA TABS: 10.0000 ug | ORAL_TABLET | Freq: Every day | VAGINAL | Status: DC

## 2013-05-10 MED ORDER — DICLOFENAC SODIUM 75 MG PO TBEC: 75.0000 mg | DELAYED_RELEASE_TABLET | Freq: Two times a day (BID) | ORAL | Status: DC

## 2013-05-10 MED ORDER — INSULIN GLARGINE 100 UNIT/ML SOLOSTAR PEN: 12.0000 [IU] | PEN_INJECTOR | Freq: Every day | SUBCUTANEOUS | Status: DC

## 2013-05-10 MED ORDER — LISINOPRIL 5 MG PO TABS: 5.0000 mg | ORAL_TABLET | Freq: Every day | ORAL | Status: DC

## 2013-05-10 NOTE — Progress Notes (Signed)
[facility] - 52 y.o. female MRN [id number]  Date of birth: 1961-06-25  HPI & ROS  Bridget [name] presents today for Medical Managment of Chronic Issues  #  Hypertension - chronic problem, adequately controlled.    no orthostasis,   no peripheral edema  no chest pain, no dyspnea on exertion, no orthopnea/PND  no episodes of unilateral weakness, dysarthria or acute visual changes  #  Diabetes - chronic problem, adequately controlled.   Blood Sugar checks/ranges: no hypoglycemia   no hypoglycemic symptoms/episodes.   no poluria, no polydipsia,  no new visual problems, last eye exam: will obtain today  LE dysesthesias: None  self foot checks performed: Daily  On neurontin but does not notice any difference when not taking  # Hypothyroid: no palpitation, sweating, weight gain/loss, hair/nail changes   #  Chronic Knee Pain - chronic problem, seems to be worsening.  Has been told needs B TKR.  Has tried injections and seem to be helpful only for short amount of time.  No viscosupplementation tried.   controlled.     TLC Compliance Diet: noncompliant some of the time  Exercise: noncompliant much of the time  MEDS: compliant all of the time      Care Coordination & Pertinent History   Pt is followed by [facility] The patients history is remarkable for: # Endocrine: DM, Hypothyroid, Obesity  Working on dietary changes and medication managment # CV: HTN  No prior CAD/CVA # SLE & Pain and MSK: Diabetic Neuropathy, Osteoarthritis of Multiple Sights (Back, Knees, Ankles, chronic venous insufficiency) Pertinent History:  Abdominal Hysterectomy Health Care Maintenance:  DM maintenance Brief Social History:  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 Objective Findings  VITALS: BP 146/83  Pulse 90  Temp(Src) 98.7 F (37.1 C) (Oral)  Ht 5\' 5"  (1.651 m)  Wt 238 lb 8 oz (108.183 kg)  BMI 39.69 kg/m2 PHYSICAL EXAM: GENERAL: Adult AA  female. In no discomfort; no respiratory distress  PSYCH: alert and appropriate, good insight   HNEENT:   CARDIO: RRR, S1/S2 heard, no murmur  LUNGS: CTA B, no wheezes, no crackles  ABDOMEN:    EXTREM:    Warm, well perfused.  Moves all 4 extremities spontaneously; no lateralization.  No noted foot lesions.  Distal pulses intact.  trace pretibial edema.  Sensation is intact to monofilament testing   GU:    SKIN:     NEUROMSK:  B patellar grind, chronic OA changes, genu valgus      Medication & Orders   Previous Medications   CETIRIZINE (ZYRTEC) 10 MG TABLET  Take 1 tablet (10 mg total) by mouth daily.   DULOXETINE (CYMBALTA) 20 MG CAPSULE    TAKE 1 CAPSULE BY MOUTH DAILY   FUROSEMIDE (LASIX) 20 MG TABLET    Take 20 mg by mouth daily. On Tuesday Thursday Saturday and Sunday   GLIMEPIRIDE (AMARYL) 1 MG TABLET    TAKE 3 TABLETS BY MOUTH DAILY BEFORE BREAKFAST   GLUCOSE BLOOD (TRUETEST TEST) TEST STRIP    Use as instructed   IBUPROFEN (ADVIL,MOTRIN) 200 MG TABLET    Take 400 mg by mouth every 6 (six) hours as needed. For pain   JANUVIA 100 MG TABLET    TAKE 1 TABLET BY MOUTH ONCE DAILY   ULTICARE INSULIN SYRINGE 30G X 5/16" 0.3 ML MISC    USE AS DIRECTED WITH LANTUS ONCE DAILY   Modified Medications   No medications on file   New Prescriptions   ALCOHOL SWABS (ALCOHOL PREPS) PADS    1 Device by Does not apply  route 4 (four) times daily -  before meals and at bedtime.   DICLOFENAC (VOLTAREN) 75 MG EC TABLET    Take 1 tablet (75 mg total) by mouth 2 (two) times daily.   ESTRADIOL (VAGIFEM) 10 MCG TABS VAGINAL TABLET    Place 1 tablet (10 mcg total) vaginally daily.   INSULIN GLARGINE (LANTUS SOLOSTAR) 100 UNIT/ML SOPN    Inject 12 Units into the skin at bedtime.   LISINOPRIL (PRINIVIL,ZESTRIL) 5 MG TABLET    Take 1 tablet (5 mg total) by mouth daily.   Discontinued Medications   AZITHROMYCIN (ZITHROMAX) 500 MG TABLET    Take 1 tablet (500 mg total) by mouth daily.   CIPROFLOXACIN (CIPRO) 500 MG TABLET    Take 1 tablet (500 mg total) by mouth 2 (two) times daily.   FLUCONAZOLE (DIFLUCAN) 150 MG TABLET    TAKE 1 TABLET (150 MG TOTAL) BY MOUTH ONCE. TAKE ANOTHER PILL IN 4 DAYS IF NOT IMPROVED.   FLUCONAZOLE (DIFLUCAN) 150 MG TABLET    Take 1 tablet (150 mg total) by mouth once.   FUROSEMIDE (LASIX) 20 MG TABLET    TAKE 1 TABLET BY MOUTH FOUR TIMES A WEEK   GABAPENTIN (NEURONTIN) 300 MG CAPSULE    Take 1 capsule (300 mg total) by mouth 3 (three) times daily.   INSULIN GLARGINE (LANTUS) 100 UNITS/ML SOLN    Inject 10 Units into the skin daily at 10 pm.   METRONIDAZOLE (FLAGYL) 500 MG TABLET    Take 1 tablet (500 mg total) by mouth 2 (two) times daily.   Orders Placed This Encounter  Procedures  . Lipid panel    Standing Status: Future     Number of Occurrences: 1     Standing Expiration Date: 05/10/2014  . Comprehensive metabolic panel    Standing Status: Future     Number of Occurrences: 1     Standing Expiration Date: 05/10/2014  . CBC    Standing Status: Future     Number of Occurrences: 1     Standing Expiration Date: 05/10/2014  . TSH    Standing Status: Future     Number of Occurrences: 1     Standing Expiration Date: 05/10/2014  . T4, Free    Standing Status: Future     Number of Occurrences: 1     Standing Expiration Date: 05/10/2014  . Ambulatory referral to Orthopedic Surgery     Referral Priority:  Routine    Referral Type:  Surgical  Referral Reason:  Specialty Services Required    Requested Specialty:  Orthopedic Surgery    Number of Visits Requested:  1  . HgB A1c  . Retinal/fundus photography   Assessment   1. DIABETES MELLITUS, TYPE II, UNCONTROLLED   2. OSTEOARTHRITIS, KNEE   3. OBESITY, NOS   4. HYPOTHYROIDISM   5. HYPERTENSION, BENIGN SYSTEMIC     Plan     Increase Lantus to 12 Units daily.  Retinal Exam today  Start Lisinopril Daily for renal protection.  Checking fasting metabolic labs next week.   Stop neurontin as doesn't appear to be effective  Start voltaren  Refer to Dr. Amanda Pea to discuss further management for B Knee OA.  Consider discussing synvisc injections with Dr. Amanda Pea prior to considering knee replacements.     Follow Up Issues: > Improved control of sugars > likely needs statin given DM; pending labs

## 2013-05-10 NOTE — Patient Instructions (Addendum)
It was nice to see you today, thanks for coming in!  1. DIABETES MELLITUS, TYPE II, UNCONTROLLED Please come back to check labs next week. Increase your Lantus 12 units daily We're starting you on lisinopril today.  3. OSTEOARTHRITIS, KNEE Try taking - diclofenac (VOLTAREN) 75 MG EC tablet; Take 1 tablet (75 mg total) by mouth 2 (two) times daily.  Dispense: 30 tablet; Refill: 0 I will refer you to Dr. Amanda Pea to further discuss your needs.  You can consider using Synvisc injections prior to getting your knees replaced   Please plan to return to see Me in 1 month.    If you need anything prior to your next visit please call the clinic. Please Bring all medications or accurate medication list with you to each appointment; an accurate medication list is essential in providing you the best care possible.    Here are some basic nutrition rules to remember:  "Eat Real Foods & Drink Real Drinks" - if you think it was made in a factory . . it is likely best to avoid it as a staple in your diet.  Limiting these types of foods to 1-2 times per week is a good idea.  Sticking with fresh fruits and vegetables as well as home cooked meals will typically provide more nutrition and less salt than prepackaged meals.     Limit the amount of sugar sweetened and artificially sweetened foods and beverages.  Sticking with water flavored with a slice of lemon, lime or orange is a great option if you want something with flavor in it.  Using flavored seltzer water to flavor plain water will also add some bite if you want something more than flavor.       Eat at least 3 meals and 1-2 snacks per day.  Aim for no more than 5 hours between eating.   Here are 2 of my favorite web sites that provide great nutrition and exercise advice.   www.eats[place]movemore[place].com Www.choosemyplate.gov

## 2013-05-13 ENCOUNTER — Telehealth: Payer: Self-pay | Admitting: Sports Medicine

## 2013-05-13 ENCOUNTER — Other Ambulatory Visit: Payer: 59

## 2013-05-13 NOTE — Telephone Encounter (Signed)
Pt called because she got the prescription for the pens but she needs the meter for the pens to read the results. She also needs alcohol wipes called in also. Use the pharmacy on file. JW

## 2013-05-14 ENCOUNTER — Telehealth: Payer: Self-pay | Admitting: *Deleted

## 2013-05-14 NOTE — Telephone Encounter (Signed)
LMOVM for pt to return call. Please inform of the below:  [facility] [address] [place], [place] [address] Phone: [phone]  Appt: 05/20/13 @ 3pm Dr. Billey Chang, Maryjo Rochester

## 2013-05-15 ENCOUNTER — Other Ambulatory Visit: Payer: Self-pay | Admitting: Sports Medicine

## 2013-05-15 MED ORDER — ALCOHOL PREPS PADS: 1.0000 | MEDICATED_PAD | Freq: Three times a day (TID) | Status: DC

## 2013-05-17 ENCOUNTER — Telehealth: Payer: Self-pay | Admitting: Sports Medicine

## 2013-05-17 ENCOUNTER — Other Ambulatory Visit: Payer: 59

## 2013-05-17 DIAGNOSIS — E785 Hyperlipidemia, unspecified: Secondary | ICD-10-CM

## 2013-05-17 DIAGNOSIS — IMO0001 Reserved for inherently not codable concepts without codable children: Secondary | ICD-10-CM

## 2013-05-17 DIAGNOSIS — E039 Hypothyroidism, unspecified: Secondary | ICD-10-CM

## 2013-05-17 LAB — COMPREHENSIVE METABOLIC PANEL
ALT: 28 U/L (ref 0–35)
Albumin: 4.2 g/dL (ref 3.5–5.2)
CO2: 26 mEq/L (ref 19–32)
Chloride: 105 mEq/L (ref 96–112)
Glucose, Bld: 131 mg/dL — ABNORMAL HIGH (ref 70–99)
Potassium: 4.1 mEq/L (ref 3.5–5.3)
Sodium: 140 mEq/L (ref 135–145)
Total Protein: 6.8 g/dL (ref 6.0–8.3)

## 2013-05-17 LAB — CBC
HCT: 38 % (ref 36.0–46.0)
MCH: 28.9 pg (ref 26.0–34.0)
MCHC: 35 g/dL (ref 30.0–36.0)
RDW: 14.2 % (ref 11.5–15.5)

## 2013-05-17 NOTE — Telephone Encounter (Signed)
Pt needs needles to go with the insulin pens sent to [facility] pharmacy.

## 2013-05-17 NOTE — Telephone Encounter (Signed)
Patient requested needle to go with the pens.I did call in rx for needles they come 100 in a box,I approved 100/1box and 2 refill.Patient test once daily.Dr Berline Chough hope this is ok.patient is aware needles would be ready for pick up this afternoon.THANK YOU . Chinedum Vanhouten, Virgel Bouquet

## 2013-05-17 NOTE — Telephone Encounter (Signed)
Agree. thanks

## 2013-05-17 NOTE — Progress Notes (Signed)
FLP,CMP,CBC,TSH AND F-T4 DONE TODAY Violet Cart

## 2013-05-18 LAB — T4, FREE: Free T4: 1.09 ng/dL (ref 0.80–1.80)

## 2013-05-24 ENCOUNTER — Encounter: Payer: Self-pay | Admitting: Sports Medicine

## 2013-05-24 DIAGNOSIS — E785 Hyperlipidemia, unspecified: Secondary | ICD-10-CM | POA: Insufficient documentation

## 2013-05-24 MED ORDER — ATORVASTATIN CALCIUM 40 MG PO TABS: 20.0000 mg | ORAL_TABLET | Freq: Every day | ORAL | Status: DC

## 2013-05-24 NOTE — Addendum Note (Signed)
Addended by: Gaspar Bidding D on: 05/24/2013 01:32 PM   Modules accepted: Orders

## 2013-05-24 NOTE — Assessment & Plan Note (Addendum)
Lantus titrated last visit Started Lisinopril last visit Starting Lipitor

## 2013-05-24 NOTE — Progress Notes (Signed)
Please call patient to inform we are starting her on Lipitor.  We discussed this at her last visit.  Her cholesterol is elevated in her overall risk of a major cardiovascular event (heart attack or stroke) is 17.4%.  If we can optimize her diabetes, controlled her blood pressure and keep her on his cholesterol lowering medicine her risk can be reduced to 1%.

## 2013-05-24 NOTE — Assessment & Plan Note (Signed)
ASCVD Risk score of: 17.4% - START LIPITOR 20 titrate to 40

## 2013-05-27 NOTE — Progress Notes (Signed)
Left message on patient's voicemail.

## 2013-06-13 ENCOUNTER — Other Ambulatory Visit: Payer: Self-pay | Admitting: Sports Medicine

## 2013-06-27 ENCOUNTER — Other Ambulatory Visit: Payer: Self-pay | Admitting: Family Medicine

## 2013-07-04 ENCOUNTER — Other Ambulatory Visit: Payer: Self-pay

## 2013-07-29 ENCOUNTER — Ambulatory Visit (INDEPENDENT_AMBULATORY_CARE_PROVIDER_SITE_OTHER): Payer: 59 | Admitting: Family Medicine

## 2013-07-29 ENCOUNTER — Encounter: Payer: Self-pay | Admitting: Family Medicine

## 2013-07-29 VITALS — BP 126/90 | HR 86 | Temp 98.4°F | Resp 18 | Wt 238.0 lb

## 2013-07-29 DIAGNOSIS — J069 Acute upper respiratory infection, unspecified: Secondary | ICD-10-CM | POA: Insufficient documentation

## 2013-07-29 DIAGNOSIS — J309 Allergic rhinitis, unspecified: Secondary | ICD-10-CM

## 2013-07-29 MED ORDER — AZITHROMYCIN 250 MG PO TABS: ORAL_TABLET | ORAL | Status: DC

## 2013-07-29 NOTE — Patient Instructions (Signed)
Upper Respiratory Infection, Adult An upper respiratory infection (URI) is also sometimes known as the common cold. The upper respiratory tract includes the nose, sinuses, throat, trachea, and bronchi. Bronchi are the airways leading to the lungs. Most people improve within 1 week, but symptoms can last up to 2 weeks. A residual cough may last even longer.  CAUSES Many different viruses can infect the tissues lining the upper respiratory tract. The tissues become irritated and inflamed and often become very moist. Mucus production is also common. A cold is contagious. You can easily spread the virus to others by oral contact. This includes kissing, sharing a glass, coughing, or sneezing. Touching your mouth or nose and then touching a surface, which is then touched by another person, can also spread the virus. SYMPTOMS  Symptoms typically develop 1 to 3 days after you come in contact with a cold virus. Symptoms vary from person to person. They may include:  Runny nose.  Sneezing.  Nasal congestion.  Sinus irritation.  Sore throat.  Loss of voice (laryngitis).  Cough.  Fatigue.  Muscle aches.  Loss of appetite.  Headache.  Low-grade fever. DIAGNOSIS  You might diagnose your own cold based on familiar symptoms, since most people get a cold 2 to 3 times a year. Your caregiver can confirm this based on your exam. Most importantly, your caregiver can check that your symptoms are not due to another disease such as strep throat, sinusitis, pneumonia, asthma, or epiglottitis. Blood tests, throat tests, and X-rays are not necessary to diagnose a common cold, but they may sometimes be helpful in excluding other more serious diseases. Your caregiver will decide if any further tests are required. RISKS AND COMPLICATIONS  You may be at risk for a more severe case of the common cold if you smoke cigarettes, have chronic heart disease (such as heart failure) or lung disease (such as asthma), or if  you have a weakened immune system. The very young and very old are also at risk for more serious infections. Bacterial sinusitis, middle ear infections, and bacterial pneumonia can complicate the common cold. The common cold can worsen asthma and chronic obstructive pulmonary disease (COPD). Sometimes, these complications can require emergency medical care and may be life-threatening. PREVENTION  The best way to protect against getting a cold is to practice good hygiene. Avoid oral or hand contact with people with cold symptoms. Wash your hands often if contact occurs. There is no clear evidence that vitamin C, vitamin E, echinacea, or exercise reduces the chance of developing a cold. However, it is always recommended to get plenty of rest and practice good nutrition. TREATMENT  Treatment is directed at relieving symptoms. There is no cure. Antibiotics are not effective, because the infection is caused by a virus, not by bacteria. Treatment may include:  Increased fluid intake. Sports drinks offer valuable electrolytes, sugars, and fluids.  Breathing heated mist or steam (vaporizer or shower).  Eating chicken soup or other clear broths, and maintaining good nutrition.  Getting plenty of rest.  Using gargles or lozenges for comfort.  Controlling fevers with ibuprofen or acetaminophen as directed by your caregiver.  Increasing usage of your inhaler if you have asthma. Zinc gel and zinc lozenges, taken in the first 24 hours of the common cold, can shorten the duration and lessen the severity of symptoms. Pain medicines may help with fever, muscle aches, and throat pain. A variety of non-prescription medicines are available to treat congestion and runny nose. Your caregiver   can make recommendations and may suggest nasal or lung inhalers for other symptoms.  HOME CARE INSTRUCTIONS   Only take over-the-counter or prescription medicines for pain, discomfort, or fever as directed by your  caregiver.  Use a warm mist humidifier or inhale steam from a shower to increase air moisture. This may keep secretions moist and make it easier to breathe.  Drink enough water and fluids to keep your urine clear or pale yellow.  Rest as needed.  Return to work when your temperature has returned to normal or as your caregiver advises. You may need to stay home longer to avoid infecting others. You can also use a face mask and careful hand washing to prevent spread of the virus. SEEK MEDICAL CARE IF:   After the first few days, you feel you are getting worse rather than better.  You need your caregiver's advice about medicines to control symptoms.  You develop chills, worsening shortness of breath, or brown or red sputum. These may be signs of pneumonia.  You develop yellow or brown nasal discharge or pain in the face, especially when you bend forward. These may be signs of sinusitis.  You develop a fever, swollen neck glands, pain with swallowing, or white areas in the back of your throat. These may be signs of strep throat. SEEK IMMEDIATE MEDICAL CARE IF:   You have a fever.  You develop severe or persistent headache, ear pain, sinus pain, or chest pain.  You develop wheezing, a prolonged cough, cough up blood, or have a change in your usual mucus (if you have chronic lung disease).  You develop sore muscles or a stiff neck. Document Released: 02/08/2001 Document Revised: 11/07/2011 Document Reviewed: 12/17/2010 [facility] Patient Information 2014 [place], [place].   I have called in a antibiotic for you  to pick up on Friday of you are not feeling better. Until then treat with tylenol, zyrtec, nasal spray and mucinex.

## 2013-07-29 NOTE — Progress Notes (Signed)
Pt c/o cold sxs onset yest Sxs include: ST, runny nose, congestion, cough w/yellow phlegm Denies: f/v/n/d, SOB, wheezing Just came back from [place] She is alert w/no signs of acute distress.... Rmeza, CMA

## 2013-07-29 NOTE — Progress Notes (Signed)
   Subjective:    Patient ID: Bridget [name], female    DOB: 1960-09-04, 52 y.o.   MRN: [id number]  HPI patient reports she Started Saturday with a scratchy throat. Sunday progressed to congestion and sniffles. Today she is with headache and congestion with chest tightness. Coughing up think yellow phlegm. Recent travel to [place] where she was exposed to a large amount of secondhand smoke. No N,V, D or fever. She has experienced chills.   Review of Systems Negative, with the exception of above mentioned in HPI  Objective:   Physical Exam BP 126/103  Pulse 86  Temp(Src) 98.4 F (36.9 C) (Oral)  Resp 18  Wt 238 lb (107.956 kg)  SpO2 99% Gen: NAD. Appears congested HEENT: AT. [place]. Bilateral TM visualized and normal in appearance. Bilateral eyes without injections or icterus. MMM. Bilateral nares with erythema. Throat without erythema or exudates. No sinus tenderness.  CV: RRR  Chest: CTAB, no wheeze or crackles Abd: Soft. . NTND. BS present.

## 2013-07-30 ENCOUNTER — Encounter: Payer: Self-pay | Admitting: Family Medicine

## 2013-07-30 NOTE — Assessment & Plan Note (Signed)
Likely viral URI. Pt given antibiotic (Zpack d/t allergy of PCN) to fill on Friday if no improvement. Advised to treat symptomatically for now.

## 2013-07-31 ENCOUNTER — Other Ambulatory Visit: Payer: Self-pay | Admitting: Sports Medicine

## 2013-08-07 ENCOUNTER — Ambulatory Visit (INDEPENDENT_AMBULATORY_CARE_PROVIDER_SITE_OTHER): Payer: 59 | Admitting: Family Medicine

## 2013-08-07 ENCOUNTER — Encounter: Payer: Self-pay | Admitting: Family Medicine

## 2013-08-07 ENCOUNTER — Telehealth: Payer: Self-pay | Admitting: Sports Medicine

## 2013-08-07 VITALS — BP 133/71 | HR 106 | Temp 98.8°F | Ht 65.0 in | Wt 239.0 lb

## 2013-08-07 DIAGNOSIS — J069 Acute upper respiratory infection, unspecified: Secondary | ICD-10-CM

## 2013-08-07 MED ORDER — PROMETHAZINE HCL 6.25 MG/5ML PO SYRP: 12.5000 mg | ORAL_SOLUTION | Freq: Three times a day (TID) | ORAL | Status: DC | PRN

## 2013-08-07 NOTE — Progress Notes (Signed)
Bridget [name] is a 52 y.o. female who presents today for URI unresponsive to a Z-pack.  Pt original Sx began about 8-10 days ago, seen about one week ago for URI, and started taking Z-Pack about 6 days ago.  She completed this yesterday and does not see an improvement in her Sx.  She continues to have productive cough, no increase in mucous production, and the mucous has changed from yellowish to now clearish.  She denies any fever, chills, sweats, any LAD.  Does c/o cough throughout the day that is keeping her up at night.  She has not tried anything for this other than the mucinex she started at the same time as her ABx.  Does have hx of smoking about 14 yrs ago, and did have secondhand smoke about 2 weeks ago now.   Past Medical History  Diagnosis Date  . Diabetes mellitus without complication   . Osteoarthritis   . GERD (gastroesophageal reflux disease)   . Diverticulosis   . Glaucoma   . Obesity   . Vaginal yeast infection 12/08/2010  . SHOULDER PAIN, LEFT 05/19/2010    Qualifier: Diagnosis of  By: Clotilde Dieter MD, Amber    . Retained tampon 05/21/2012  . OSTEOARTHRITIS, KNEE 04/27/2010  . OBESITY, NOS 10/26/2006  . MALAISE AND FATIGUE 05/27/2010  . HEEL SPUR 09/10/2007    Qualifier: Diagnosis of  By: Lanier Prude  MD, Cathrine Muster    . DIVERTICULOSIS, COLON 10/26/2006  . Bacterial vaginosis 12/07/2010    History  Smoking status  . Former Smoker -- 0.30 packs/day  . Types: Cigarettes  . Quit date: 08/29/1998  Smokeless tobacco  . Never Used    Comment: Quit in 2000     Family History  Problem Relation Age of Onset  . Breast cancer Mother   . Ovarian cancer Maternal Grandmother   . Stomach cancer Cousin   . Diabetes Mother   . Heart disease Maternal Grandmother     great  . Diabetes Brother     x 3    Current Outpatient Prescriptions on File Prior to Visit  Medication Sig Dispense Refill  . Alcohol Swabs (ALCOHOL PREPS) PADS 1 Device by Does not apply route 4 (four) times daily -   before meals and at bedtime.  100 each  11  . atorvastatin (LIPITOR) 40 MG tablet Take 0.5-1 tablets (20-40 mg total) by mouth daily. Take 1/2 tablet for 2 weeks then increase to 1 full tablet daily  90 tablet  3  . azithromycin (ZITHROMAX Z-PAK) 250 MG tablet Take z-pack as directed  6 each  0  . cetirizine (ZYRTEC) 10 MG tablet Take 1 tablet (10 mg total) by mouth daily.  30 tablet  11  . diclofenac (VOLTAREN) 75 MG EC tablet TAKE 1 TABLET BY MOUTH TWICE DAILY  30 tablet  0  . DULoxetine (CYMBALTA) 20 MG capsule TAKE 1 CAPSULE BY MOUTH DAILY  30 capsule  3  . Estradiol (VAGIFEM) 10 MCG TABS vaginal tablet Place 1 tablet (10 mcg total) vaginally daily.  8 tablet    . furosemide (LASIX) 20 MG tablet Take 20 mg by mouth daily. On Tuesday Thursday Saturday and Sunday      . glimepiride (AMARYL) 1 MG tablet TAKE 3 TABLETS BY MOUTH DAILY BEFORE BREAKFAST  90 tablet  4  . glucose blood (TRUETEST TEST) test strip Use as instructed  100 each  12  . ibuprofen (ADVIL,MOTRIN) 200 MG tablet Take 400 mg by mouth every 6 (  six) hours as needed. For pain      . Insulin Glargine (LANTUS SOLOSTAR) 100 UNIT/ML SOPN Inject 12 Units into the skin at bedtime.  5 pen  5  . JANUVIA 100 MG tablet TAKE 1 TABLET BY MOUTH ONCE DAILY  30 tablet  11  . lisinopril (PRINIVIL,ZESTRIL) 5 MG tablet TAKE 1 TABLET BY MOUTH ONCE DAILY  30 tablet  1  . ULTICARE INSULIN SYRINGE 30G X 5/16" 0.3 ML MISC USE AS DIRECTED WITH LANTUS ONCE DAILY  100 each  5   No current facility-administered medications on file prior to visit.    ROS: Per HPI.  All other systems reviewed and are negative.   Physical Exam Filed Vitals:   08/07/13 1357  BP: 133/71  Pulse: 106  Temp: 98.8 F (37.1 C)    Physical Examination: General appearance - alert, well appearing, and in no distress Mouth - mucous membranes moist, pharynx normal without lesions Lymphatics - no palpable lymphadenopathy Chest - clear to auscultation, no wheezes, rales or  rhonchi, symmetric air entry Heart - normal rate and regular rhythm, no murmurs noted

## 2013-08-07 NOTE — Patient Instructions (Signed)
Bridget [name], please start taking Mucinex DM two pills every 12 hours, instead of normal Mucinex.  As well we will give you phenergan to help you sleep at your normal time.  If you do not start to feel better by Thursday or Friday, please call us again.   Thanks, Dr. Paulina Fusi

## 2013-08-07 NOTE — Assessment & Plan Note (Signed)
Agree with Dr. Claiborne Billings, this most likely is viral URI.  Pt took Z-Pack over the last 5 days with no improvement and has only been taking Mucinex for expectorant.  Otherwise has not tried too much for cough or sleep and has has had trouble sleeping.  She is allergic to codeine so recommend she switch to Mucinex DM BID, and will give her phenergan to take prior to bedtime.  Normal WOB, normal lung exam, afebrile, and O2 saturation >97%, making less concerned for pneumonia.  If no improvement by Thursday or Friday, can consider another tx with ABx like doxy x 10 day course.

## 2013-08-07 NOTE — Telephone Encounter (Signed)
Pt called because she is still sick. She talked to Dr. Berline Chough last night since he was on call. She would like some more antibiotics called in or does she need to be seen again. Please call and let her know. jw

## 2013-08-07 NOTE — Telephone Encounter (Signed)
Patient was scheduled to see Dr Paulina Fusi @ 1:45.patient informed. Bridget [name], Bridget [name]

## 2013-08-13 ENCOUNTER — Emergency Department (HOSPITAL_COMMUNITY)
Admission: EM | Admit: 2013-08-13 | Discharge: 2013-08-13 | Disposition: A | Discharge status: Home / Self Care | Payer: 59 | Attending: Emergency Medicine | Admitting: Emergency Medicine

## 2013-08-13 ENCOUNTER — Encounter (HOSPITAL_COMMUNITY): Payer: Self-pay | Admitting: Emergency Medicine

## 2013-08-13 ENCOUNTER — Other Ambulatory Visit: Payer: Self-pay

## 2013-08-13 DIAGNOSIS — Z8619 Personal history of other infectious and parasitic diseases: Secondary | ICD-10-CM | POA: Insufficient documentation

## 2013-08-13 DIAGNOSIS — R55 Syncope and collapse: Secondary | ICD-10-CM | POA: Insufficient documentation

## 2013-08-13 DIAGNOSIS — E119 Type 2 diabetes mellitus without complications: Secondary | ICD-10-CM | POA: Insufficient documentation

## 2013-08-13 DIAGNOSIS — Z794 Long term (current) use of insulin: Secondary | ICD-10-CM | POA: Insufficient documentation

## 2013-08-13 DIAGNOSIS — IMO0002 Reserved for concepts with insufficient information to code with codable children: Secondary | ICD-10-CM | POA: Insufficient documentation

## 2013-08-13 DIAGNOSIS — Z8719 Personal history of other diseases of the digestive system: Secondary | ICD-10-CM | POA: Insufficient documentation

## 2013-08-13 DIAGNOSIS — Z79899 Other long term (current) drug therapy: Secondary | ICD-10-CM | POA: Insufficient documentation

## 2013-08-13 DIAGNOSIS — R110 Nausea: Secondary | ICD-10-CM | POA: Insufficient documentation

## 2013-08-13 DIAGNOSIS — E860 Dehydration: Secondary | ICD-10-CM | POA: Insufficient documentation

## 2013-08-13 DIAGNOSIS — M1710 Unilateral primary osteoarthritis, unspecified knee: Secondary | ICD-10-CM | POA: Insufficient documentation

## 2013-08-13 DIAGNOSIS — Z880 Allergy status to penicillin: Secondary | ICD-10-CM | POA: Insufficient documentation

## 2013-08-13 DIAGNOSIS — E669 Obesity, unspecified: Secondary | ICD-10-CM | POA: Insufficient documentation

## 2013-08-13 DIAGNOSIS — Z87440 Personal history of urinary (tract) infections: Secondary | ICD-10-CM | POA: Insufficient documentation

## 2013-08-13 DIAGNOSIS — Z87891 Personal history of nicotine dependence: Secondary | ICD-10-CM | POA: Insufficient documentation

## 2013-08-13 DIAGNOSIS — Z791 Long term (current) use of non-steroidal anti-inflammatories (NSAID): Secondary | ICD-10-CM | POA: Insufficient documentation

## 2013-08-13 LAB — POCT I-STAT, CHEM 8
BUN: 13 mg/dL (ref 6–23)
Chloride: 105 mEq/L (ref 96–112)
Creatinine, Ser: 0.8 mg/dL (ref 0.50–1.10)
Glucose, Bld: 261 mg/dL — ABNORMAL HIGH (ref 70–99)
Hemoglobin: 13.9 g/dL (ref 12.0–15.0)
Potassium: 3.8 mEq/L (ref 3.5–5.1)
Sodium: 142 mEq/L (ref 135–145)
TCO2: 24 mmol/L (ref 0–100)

## 2013-08-13 LAB — BASIC METABOLIC PANEL
BUN: 13 mg/dL (ref 6–23)
CO2: 23 mEq/L (ref 19–32)
Chloride: 104 mEq/L (ref 96–112)
Creatinine, Ser: 0.63 mg/dL (ref 0.50–1.10)
GFR calc Af Amer: >90 mL/min (ref >90)
Glucose, Bld: 251 mg/dL — ABNORMAL HIGH (ref 70–99)
Potassium: 3.9 mEq/L (ref 3.5–5.1)
Sodium: 138 mEq/L (ref 135–145)

## 2013-08-13 LAB — URINE MICROSCOPIC-ADD ON

## 2013-08-13 LAB — CBC
HCT: 36.7 % (ref 36.0–46.0)
MCH: 29.3 pg (ref 26.0–34.0)
MCHC: 36 g/dL (ref 30.0–36.0)
Platelets: 229 10*3/uL (ref 150–400)
RBC: 4.5 MIL/uL (ref 3.87–5.11)
RDW: 12.9 % (ref 11.5–15.5)
WBC: 7.3 10*3/uL (ref 4.0–10.5)

## 2013-08-13 LAB — POCT I-STAT TROPONIN I: Troponin i, poc: 0 ng/mL (ref 0.00–0.08)

## 2013-08-13 LAB — URINALYSIS, ROUTINE W REFLEX MICROSCOPIC
Glucose, UA: >1000 mg/dL — AB
Leukocytes, UA: NEGATIVE
Protein, ur: NEGATIVE mg/dL
Specific Gravity, Urine: 1.025 (ref 1.005–1.030)
Urobilinogen, UA: 0.2 mg/dL (ref 0.0–1.0)

## 2013-08-13 MED ORDER — ONDANSETRON HCL 4 MG/2ML IJ SOLN: 4.0000 mg | Freq: Once | INTRAMUSCULAR | Status: DC

## 2013-08-13 MED ORDER — ONDANSETRON HCL 4 MG/2ML IJ SOLN
4.0000 mg | Freq: Once | INTRAMUSCULAR | Status: AC
  Administered 2013-08-13: 4 mg via INTRAVENOUS
  Filled 2013-08-13: qty 2

## 2013-08-13 MED ORDER — SODIUM CHLORIDE 0.9 % IV BOLUS (SEPSIS)
1000.0000 mL | Freq: Once | INTRAVENOUS | Status: AC
  Administered 2013-08-13: 1000 mL via INTRAVENOUS

## 2013-08-13 MED ORDER — ONDANSETRON 8 MG PO TBDP: 8.0000 mg | ORAL_TABLET | Freq: Three times a day (TID) | ORAL | Status: DC | PRN

## 2013-08-13 NOTE — ED Notes (Addendum)
Pt. States she feels dizzy and nauseous, "I feel like I'm going to pass out". Pt. Alert and oriented x4. Pt is a diabetic. CBG 250s, states this is high for her but she was late taking insulin today. Pt. Also reports recent URI that she is taking antibiotics for. Denies chest pain or cardiac hx

## 2013-08-13 NOTE — ED Notes (Signed)
Pt. States fluid bolus is helping with her dizziness and nausea. States she still "doesn't feel right". Pt. Resting in bed.

## 2013-08-13 NOTE — ED Notes (Signed)
Pt. States sudden onset dizziness, nausea and "hot flashes". Brought to ED by rapid response nurse from another department. Pt. Alert and oriented x4.

## 2013-08-13 NOTE — ED Provider Notes (Signed)
CSN: [id number]     Arrival date & time 08/13/13  0207 History   First MD Initiated Contact with Patient 08/13/13 0358     Chief Complaint  Patient presents with  . Dizziness  . Nausea   (Consider location/radiation/quality/duration/timing/severity/associated sxs/prior Treatment) HPI 52 yo female presents to the ER from switchboard in hospital with complaint of near syncope.  Pt has had cough, congestion for 7-10 days, has been on abx without improvement and currently taking mucinex D.  She reports she has had headaches and nausea since taking mucinex D, without significant improvement in sxs.  Today when coming to work felt run down, fatigued.  During shift, felt unwell, dizzy.  After eating soup, nausea increased, and when standing felt like she was going to pass out.  Pt took her insulin later today than usual, and thinks her sugar is off.  Past Medical History  Diagnosis Date  . Diabetes mellitus without complication   . Osteoarthritis   . GERD (gastroesophageal reflux disease)   . Diverticulosis   . Glaucoma   . Obesity   . Vaginal yeast infection 12/08/2010  . SHOULDER PAIN, LEFT 05/19/2010    Qualifier: Diagnosis of  By: Clotilde Dieter MD, Amber    . Retained tampon 05/21/2012  . OSTEOARTHRITIS, KNEE 04/27/2010  . OBESITY, NOS 10/26/2006  . MALAISE AND FATIGUE 05/27/2010  . HEEL SPUR 09/10/2007    Qualifier: Diagnosis of  By: Lanier Prude  MD, Cathrine Muster    . DIVERTICULOSIS, COLON 10/26/2006  . Bacterial vaginosis 12/07/2010   Past Surgical History  Procedure Laterality Date  . Abdominal hysterectomy  1990  . Bladder surgery  2001  . Cataract extraction Right 2001    with implant   Family History  Problem Relation Age of Onset  . Breast cancer Mother   . Ovarian cancer Maternal Grandmother   . Stomach cancer Cousin   . Diabetes Mother   . Heart disease Maternal Grandmother     great  . Diabetes Brother     x 3   History  Substance Use Topics  . Smoking status: Former Smoker --  0.30 packs/day    Types: Cigarettes    Quit date: 08/29/1998  . Smokeless tobacco: Never Used     Comment: Quit in 2000   . Alcohol Use: Yes     Comment: 1 per week   OB History   Grav Para Term Preterm Abortions TAB SAB Ect Mult Living                 Review of Systems  All other systems reviewed and are negative.    Allergies  Citrus; Codeine; Metformin and related; Metoclopramide hcl; Penicillins; Propoxyphene-acetaminophen; and Sulfonamide derivatives  Home Medications   Current Outpatient Rx  Name  Route  Sig  Dispense  Refill  . Alcohol Swabs (ALCOHOL PREPS) PADS   Does not apply   1 Device by Does not apply route 4 (four) times daily -  before meals and at bedtime.   100 each   11   . cetirizine (ZYRTEC) 10 MG tablet   Oral   Take 1 tablet (10 mg total) by mouth daily.   30 tablet   11   . dextromethorphan-guaiFENesin (MUCINEX DM) 30-600 MG per 12 hr tablet   Oral   Take 2 tablets by mouth 2 (two) times daily.         . diclofenac (VOLTAREN) 75 MG EC tablet   Oral   Take 75 mg by  mouth 2 (two) times daily.         . DULoxetine (CYMBALTA) 20 MG capsule   Oral   Take 20 mg by mouth daily.         . furosemide (LASIX) 20 MG tablet   Oral   Take 20 mg by mouth daily. On Tuesday Thursday Saturday and Sunday         . glimepiride (AMARYL) 1 MG tablet   Oral   Take 3 mg by mouth daily with breakfast.         . ibuprofen (ADVIL,MOTRIN) 200 MG tablet   Oral   Take 400 mg by mouth every 6 (six) hours as needed. For pain         . Insulin Glargine (LANTUS SOLOSTAR) 100 UNIT/ML SOPN   Subcutaneous   Inject 12 Units into the skin at bedtime.   5 pen   5   . lisinopril (PRINIVIL,ZESTRIL) 5 MG tablet   Oral   Take 5 mg by mouth daily.         . sitaGLIPtin (JANUVIA) 100 MG tablet   Oral   Take 100 mg by mouth daily.         Marland Kitchen ULTICARE INSULIN SYRINGE 30G X 5/16" 0.3 ML MISC      USE AS DIRECTED WITH LANTUS ONCE DAILY   100 each    5   . atorvastatin (LIPITOR) 40 MG tablet   Oral   Take 40 mg by mouth daily.         Marland Kitchen EXPIRED: glucose blood (TRUETEST TEST) test strip      Use as instructed   100 each   12   . ondansetron (ZOFRAN ODT) 8 MG disintegrating tablet   Oral   Take 1 tablet (8 mg total) by mouth every 8 (eight) hours as needed for nausea or vomiting.   20 tablet   0   . EXPIRED: promethazine (PHENERGAN) 12.5 MG tablet   Oral   Take 1 tablet (12.5 mg total) by mouth every 6 (six) hours as needed for nausea.   20 tablet   0    BP 114/58  Pulse 90  Temp(Src) 98.3 F (36.8 C) (Oral)  Resp 18  SpO2 100% Physical Exam  Nursing note and vitals reviewed. Constitutional: She is oriented to person, place, and time. She appears well-developed and well-nourished.  HENT:  Head: Normocephalic and atraumatic.  Right Ear: External ear normal.  Left Ear: External ear normal.  Nasal congestion.  Dry mucous membranes  Eyes: Conjunctivae and EOM are normal. Pupils are equal, round, and reactive to light.  Neck: Normal range of motion. Neck supple. No JVD present. No tracheal deviation present. No thyromegaly present.  Cardiovascular: Normal rate, regular rhythm, normal heart sounds and intact distal pulses.  Exam reveals no gallop and no friction rub.   No murmur heard. Pulmonary/Chest: Effort normal and breath sounds normal. No stridor. No respiratory distress. She has no wheezes. She has no rales. She exhibits no tenderness.  Abdominal: Soft. Bowel sounds are normal. She exhibits no distension and no mass. There is no tenderness. There is no rebound and no guarding.  Musculoskeletal: Normal range of motion. She exhibits no edema and no tenderness.  Lymphadenopathy:    She has no cervical adenopathy.  Neurological: She is alert and oriented to person, place, and time. No cranial nerve deficit. She exhibits normal muscle tone. Coordination normal.  Skin: Skin is warm and dry. No rash noted. No  erythema.  No pallor.  Psychiatric: She has a normal mood and affect. Her behavior is normal. Judgment and thought content normal.    ED Course  Procedures (including critical care time) Labs Review Labs Reviewed  BASIC METABOLIC PANEL - Abnormal; Notable for the following:    Glucose, Bld 251 (*)    All other components within normal limits  URINALYSIS, ROUTINE W REFLEX MICROSCOPIC - Abnormal; Notable for the following:    Glucose, UA >1000 (*)    All other components within normal limits  URINE MICROSCOPIC-ADD ON - Abnormal; Notable for the following:    Squamous Epithelial / LPF FEW (*)    All other components within normal limits  GLUCOSE, CAPILLARY - Abnormal; Notable for the following:    Glucose-Capillary 250 (*)    All other components within normal limits  POCT I-STAT, CHEM 8 - Abnormal; Notable for the following:    Glucose, Bld 261 (*)    All other components within normal limits  CBC  POCT I-STAT TROPONIN I   Imaging Review No results found.  EKG Interpretation   None       MDM   1. Near syncope   2. Dehydration    52 yo female with near syncope, appears dehydrated on exam.  Workup unremarkable.  Pt feeling better after iv fluids.    Olivia Mackie, MD 08/13/13 2111

## 2013-08-19 ENCOUNTER — Other Ambulatory Visit: Payer: Self-pay | Admitting: Family Medicine

## 2013-08-19 DIAGNOSIS — R110 Nausea: Secondary | ICD-10-CM

## 2013-08-19 MED ORDER — PROMETHAZINE HCL 12.5 MG PO TABS: 12.5000 mg | ORAL_TABLET | Freq: Four times a day (QID) | ORAL | Status: DC | PRN

## 2013-08-19 NOTE — Telephone Encounter (Signed)
Hope you're feeling better!

## 2013-08-20 ENCOUNTER — Other Ambulatory Visit: Payer: Self-pay | Admitting: Family Medicine

## 2013-09-05 ENCOUNTER — Encounter: Payer: Self-pay | Admitting: Sports Medicine

## 2013-09-05 MED ORDER — GLIMEPIRIDE 4 MG PO TABS: 4.0000 mg | ORAL_TABLET | Freq: Every day | ORAL | Status: DC

## 2013-09-05 NOTE — Telephone Encounter (Signed)
Patient calls in. Glucose been running high.  Needs to know if she needs to continue taking glimepiride. If so, needs refill. Please advise.

## 2013-09-05 NOTE — Telephone Encounter (Signed)
I'm sorry that you had not gotten this previously.  I have not seen any of these requests and have gone ahead and submitted a refill for 4mg  Amaryl  tablets to be taken each morning.  Please schedule an follow up appointment with me ASAP (my schedule is already very full this month so it may be later in the month).  Talk to you soon!

## 2013-09-06 ENCOUNTER — Telehealth: Payer: Self-pay | Admitting: *Deleted

## 2013-09-10 NOTE — Telephone Encounter (Signed)
completed

## 2013-09-27 ENCOUNTER — Encounter: Payer: Self-pay | Admitting: Sports Medicine

## 2013-09-27 ENCOUNTER — Ambulatory Visit (INDEPENDENT_AMBULATORY_CARE_PROVIDER_SITE_OTHER): Payer: 59 | Admitting: Sports Medicine

## 2013-09-27 VITALS — BP 132/91 | HR 92 | Temp 98.9°F | Ht 65.0 in | Wt 240.5 lb

## 2013-09-27 DIAGNOSIS — IMO0002 Reserved for concepts with insufficient information to code with codable children: Secondary | ICD-10-CM

## 2013-09-27 DIAGNOSIS — M329 Systemic lupus erythematosus, unspecified: Secondary | ICD-10-CM

## 2013-09-27 DIAGNOSIS — G8929 Other chronic pain: Secondary | ICD-10-CM

## 2013-09-27 DIAGNOSIS — IMO0001 Reserved for inherently not codable concepts without codable children: Secondary | ICD-10-CM

## 2013-09-27 DIAGNOSIS — E1149 Type 2 diabetes mellitus with other diabetic neurological complication: Secondary | ICD-10-CM

## 2013-09-27 DIAGNOSIS — I10 Essential (primary) hypertension: Secondary | ICD-10-CM

## 2013-09-27 DIAGNOSIS — J309 Allergic rhinitis, unspecified: Secondary | ICD-10-CM

## 2013-09-27 DIAGNOSIS — E1165 Type 2 diabetes mellitus with hyperglycemia: Secondary | ICD-10-CM

## 2013-09-27 DIAGNOSIS — E1142 Type 2 diabetes mellitus with diabetic polyneuropathy: Secondary | ICD-10-CM

## 2013-09-27 DIAGNOSIS — M1710 Unilateral primary osteoarthritis, unspecified knee: Secondary | ICD-10-CM

## 2013-09-27 DIAGNOSIS — E1140 Type 2 diabetes mellitus with diabetic neuropathy, unspecified: Secondary | ICD-10-CM

## 2013-09-27 DIAGNOSIS — R52 Pain, unspecified: Secondary | ICD-10-CM

## 2013-09-27 MED ORDER — CANAGLIFLOZIN 300 MG PO TABS: 300.0000 mg | ORAL_TABLET | Freq: Every day | ORAL | Status: DC

## 2013-09-27 MED ORDER — DICLOFENAC SODIUM 75 MG PO TBEC: 75.0000 mg | DELAYED_RELEASE_TABLET | Freq: Two times a day (BID) | ORAL | Status: DC

## 2013-09-27 MED ORDER — DULOXETINE HCL 30 MG PO CPEP: 30.0000 mg | ORAL_CAPSULE | Freq: Every day | ORAL | Status: DC

## 2013-09-27 MED ORDER — TRIAMCINOLONE ACETONIDE 55 MCG/ACT NA AERO: 2.0000 | INHALATION_SPRAY | Freq: Every day | NASAL | Status: AC

## 2013-09-27 MED ORDER — ATORVASTATIN CALCIUM 40 MG PO TABS: 40.0000 mg | ORAL_TABLET | Freq: Every day | ORAL | Status: DC

## 2013-09-27 MED ORDER — ASPIRIN 81 MG PO TABS: 81.0000 mg | ORAL_TABLET | Freq: Every day | ORAL | Status: AC

## 2013-09-27 NOTE — Assessment & Plan Note (Addendum)
referral to sports medicine for consideration of Visco supplementation of the bilateral knees She is not interested in surgery at this time and would like to prolong joint replacement as soon as possible.  She initially responded well to corticosteroids but has had diminishing returns with subsequent injections (I have not performed these at this time due to the need to address her other multiple chronic medical conditions during our visits).

## 2013-09-27 NOTE — Patient Instructions (Signed)
   Clotrimazole cream to leg twice a day  Start Baby Aspirin  Stop Lasix  Try Nasal Saline as needed before using your Nasacort  Exercise BIKE daily after work - start with 5 minutes.  If you need anything prior to your next visit please call the clinic. Please Bring all medications or accurate medication list with you to each appointment; an accurate medication list is essential in providing you the best care possible.

## 2013-09-27 NOTE — Progress Notes (Signed)
Bridget [name] - 53 y.o. 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 Feeling better but concerned this could happen again.  Was in the context of a URI and taking decongestant medications.  Continues to have congestion on regular basis; only taking nasacort PRN.  Has been following up with diabetes education and [facility] for health coaching.  Reports poor compliance with diet and exercise over the holidays and do to worsening musculoskeletal complaints.  Pt denies chest pain, dyspnea at rest or exertion, PND, lower extremity edema. Patient denies any facial asymmetry, unilateral weakness, or dysarthria. Pt denies hypoglycemic symptoms/episodes.  No reported polyuria/polydipsia. Pt is compliant with foot exams and denies any new foot lesions.    She did go to [facility] orthopedics for her ongoing bilateral knee pain that previously had been responsive to corticosteroid injections but she reports no longer seeing significant benefit with these injections and would like to try alternative means.  She is potentially interested in viscous supplementation but this has not been offered.  She is not interested in surgery at this time.  The pain which limits her ability to exercise on a regular basis is mainly in her bilateral knees but is also associated and bilateral ankles wrists and elbows.  She has previously been diagnosed with SLE but has not seen Dr. Drusilla Kanner in over 2 years and does not wish to take DMARD at this time.  She denies any falls or clicking locking or giving way of bilateral knees.   Pertinent History & Care Coordination  Bridget [name]'s major active medical problems include: # Endocrine: DM,  Hypothyroid, Obesity  Working on dietary changes and medication managment # CV: HTN  No prior CAD/CVA # SLE & Pain and MSK: Diabetic Neuropathy, Osteoarthritis of Multiple Sights (Back, Knees, Ankles, chronic venous insufficiency)  Previously treated by Dr. Corliss Skains, not wishing to for DMARD therapy  Other Pertinent History: # Abdominal Hysterectomy  Other Care Providers include:  [facility] to [facility])  Follow up Issues:    History  Smoking status  . Former Smoker -- 0.30 packs/day  . Types: Cigarettes  . Quit date: 08/29/1998  Smokeless tobacco  . Never Used    Comment: Quit in 2000    Health Maintenance Due  Topic  . Foot Exam   . Urine Microalbumin     Recent Labs  12/13/12 0741 02/06/13 0833 05/10/13 0856 05/17/13 0847  HGBA1C 8.4 8.0 7.3  --   TRIG  --   --   --  124  CHOL  --   --   --  193  HDL  --   --   --  44  LDLCALC  --   --   --  124*  TSH  --   --   --  2.477  FREET4  --   --   --  1.09       Otherwise past Medical, Surgical, Social, and Family History Reviewed per EMR Medications and Allergies reviewed and all updated if necessary. Objective Findings  VITALS: HR: 92 bpm  BP: 132/91 mmHg  TEMP: 98.9 F (37.2 C) (Oral)  RESP:    HT: 5\' 5"  (165.1 cm)  WT: 240 lb 8 oz (  109.09 kg)  BMI: 40.1   BP Readings from Last 3 Encounters:  09/27/13 132/91  08/13/13 114/58  08/07/13 133/71   Wt Readings from Last 3 Encounters:  09/27/13 240 lb 8 oz (109.09 kg)  08/07/13 239 lb (108.41 kg)  07/29/13 238 lb (107.956 kg)     PHYSICAL EXAM: GENERAL: Adult obese African American  female. In no discomfort; no respiratory distress  PSYCH: alert and appropriate, good insight   HNEENT: No JVD  CARDIO: RRR, S1/S2 heard, no murmur  LUNGS: CTA B, no wheezes, no crackles  ABDOMEN: Obese, soft, nontender  EXTREM:  B knees: Genu valgus.  Positive patellar grind.  No joint effusion.  Warm, well perfused.  Moves all 4 extremities spontaneously; no  lateralization.  No noted foot lesions.  Distal pulses 2+/4.  trace pretibial edema.  GU:   SKIN: Small scaling plaque on left medial thigh.  Non-erythematous     Assessment & Plan   Problems addressed today: General Plan & Pt Instructions:  1. Lupus (systemic lupus erythematosus)   2. OSTEOARTHRITIS, KNEE   3. HYPERTENSION, BENIGN SYSTEMIC   4. Diabetic neuropathy   5. DIABETES MELLITUS, TYPE II, UNCONTROLLED   6. Chronic pain of multiple sites - Knees, Ankles, Back   7. RHINITIS, ALLERGIC       Clotrimazole cream to leg twice a day  Start Baby Aspirin  Stop Lasix  Try Nasal Saline as needed before using your Nasacort  Exercise BIKE daily after work - start with 5 minutes.    For further discussion of A/P and for follow up issues see problem based charting.

## 2013-09-27 NOTE — Assessment & Plan Note (Signed)
Appropriate, stopping Lasix.  Adding Invokana

## 2013-09-27 NOTE — Assessment & Plan Note (Signed)
Titrating Cymbalta > Consider increasing to 60 at next visit > Consider addition of Neurontin/Lyrica

## 2013-09-27 NOTE — Assessment & Plan Note (Addendum)
Consider referral back to Dr. Corliss Skainseveshwar at [facility] orth[facility]ics for rheumatology but pt resistent.

## 2013-09-27 NOTE — Assessment & Plan Note (Signed)
Add Invokana today.  No change insulin regimen.  Keep checking sugars.  Discussed potential side effects including increased urinary frequency and potential for urinary tract infection. Followup A1c in 3 months.  [facility]12/2013.  Was 8.7.

## 2013-10-02 NOTE — Assessment & Plan Note (Signed)
Likely due to combination of OA as well as Lupus Arthritis.   Continue to encourage physical activity and pt reconsidering returning to Rheumatology for further DMARD therapy.

## 2013-10-02 NOTE — Assessment & Plan Note (Signed)
Nasal saline recommended in addition to Daily Nasacort

## 2013-10-04 ENCOUNTER — Telehealth: Payer: Self-pay | Admitting: Sports Medicine

## 2013-10-04 MED ORDER — FLUCONAZOLE 150 MG PO TABS: ORAL_TABLET | ORAL | Status: DC

## 2013-10-04 NOTE — Telephone Encounter (Signed)
Patient believes she has a yeast infection to the meds she was placed on 09/27/13. Would like to have a prescription called in for this.

## 2013-10-04 NOTE — Telephone Encounter (Signed)
Rx sent in. Please call and inform

## 2013-10-04 NOTE — Telephone Encounter (Signed)
Spoke with patient and informed her of below 

## 2013-10-28 ENCOUNTER — Other Ambulatory Visit: Payer: Self-pay | Admitting: Sports Medicine

## 2013-11-19 ENCOUNTER — Other Ambulatory Visit: Payer: Self-pay

## 2013-11-19 DIAGNOSIS — Z1231 Encounter for screening mammogram for malignant neoplasm of breast: Secondary | ICD-10-CM

## 2013-11-21 ENCOUNTER — Ambulatory Visit (INDEPENDENT_AMBULATORY_CARE_PROVIDER_SITE_OTHER): Payer: 59 | Admitting: Sports Medicine

## 2013-11-21 ENCOUNTER — Encounter: Payer: Self-pay | Admitting: Sports Medicine

## 2013-11-21 VITALS — BP 134/74 | Ht 65.0 in | Wt 237.0 lb

## 2013-11-21 DIAGNOSIS — M1710 Unilateral primary osteoarthritis, unspecified knee: Secondary | ICD-10-CM

## 2013-11-21 DIAGNOSIS — IMO0002 Reserved for concepts with insufficient information to code with codable children: Secondary | ICD-10-CM

## 2013-11-21 DIAGNOSIS — M25569 Pain in unspecified knee: Secondary | ICD-10-CM

## 2013-11-21 MED ORDER — AMITRIPTYLINE HCL 25 MG PO TABS: 25.0000 mg | ORAL_TABLET | Freq: Every day | ORAL | Status: DC

## 2013-11-21 NOTE — Patient Instructions (Addendum)
Use the compression sleeve whenever you are going to be walking or standing.  Start taking amitriptyline at night.  Follow up with Dr. Darrick PennaFields in 3 months.

## 2013-11-21 NOTE — Progress Notes (Signed)
   HPI:  Knee pain: bilateral knee pain, but left is worse than R. Has popping of knees. Has had fluid drawn out of it in the past. Has tried multiple times of medicines and physical therapy in the past, but nothing seems to help it. Is now being told that it might be her lupus causing this. Last saw Dr. Corliss Skainseveshwar several years ago. Has gone [facility] in the past and had steroid injections in the past, which helped but ran her blood sugars up. Currently taking diclofenac for her pain. Desires to avoid surgery if possible.  Dr Berline Choughigby has encouraged exercise and weight loss.  Patient admits not compliant with this.  She does have diabetic shoes with inserts  ROS: See HPI  PMFSH: hx hypothyroidism, DM2, HTN, SLE, HLD  PHYSICAL EXAM: BP 134/74  Ht 5\' 5"  (1.651 m)  Wt 237 lb (107.502 kg)  BMI 39.44 kg/m2 Gen: NAD, pleasant, cooperative, obese MSK: bilateral knees without deformity, effusion, or erythema. Crepitus present with extension of each knee. Full strength with knee flexion & extension bilaterally. Negative lachman's bilaterally. LCL & MCL intact bilaterally.  Standing and gait has more valgus on left  ASSESSMENT/PLAN:  See problem based charting for assessment/plan.  FOLLOW UP: F/u in 3 months for knee pain.  SIGNED: Estevan RyderBrittany J. Pollie MeyerMcIntyre, MD Family Medicine Resident PGY-2 [facility]

## 2013-11-21 NOTE — Assessment & Plan Note (Addendum)
OA of bilateral knees causing significant pain.  Plan: - compression sleeve while walking or standing Given for one knee but if helping use on other as well - low dose amitriptyline at start of sleep for pain moderatio Cont voltaren - not good candidate for tramadol 2/2 interaction and codeine sensitivity  Use stationary bike  Follow up with Dr. Darrick PennaFields in 3 months. If not improved at that time, consider viscous supplementation.  She was advised that this has limited EBM over CSI

## 2013-11-29 ENCOUNTER — Ambulatory Visit
Admission: RE | Admit: 2013-11-29 | Discharge: 2013-11-29 | Disposition: A | Discharge status: Home / Self Care | Payer: 59 | Source: Ambulatory Visit

## 2013-11-29 DIAGNOSIS — Z1231 Encounter for screening mammogram for malignant neoplasm of breast: Secondary | ICD-10-CM

## 2013-12-05 ENCOUNTER — Other Ambulatory Visit: Payer: Self-pay

## 2013-12-24 ENCOUNTER — Encounter: Payer: Self-pay | Admitting: Sports Medicine

## 2013-12-24 ENCOUNTER — Ambulatory Visit (INDEPENDENT_AMBULATORY_CARE_PROVIDER_SITE_OTHER): Payer: 59 | Admitting: Sports Medicine

## 2013-12-24 VITALS — BP 123/72 | HR 87 | Temp 98.6°F | Wt 234.0 lb

## 2013-12-24 DIAGNOSIS — E1165 Type 2 diabetes mellitus with hyperglycemia: Principal | ICD-10-CM

## 2013-12-24 DIAGNOSIS — R911 Solitary pulmonary nodule: Secondary | ICD-10-CM

## 2013-12-24 DIAGNOSIS — IMO0001 Reserved for inherently not codable concepts without codable children: Secondary | ICD-10-CM

## 2013-12-24 LAB — POCT GLYCOSYLATED HEMOGLOBIN (HGB A1C): Hemoglobin A1C: 6.8

## 2013-12-24 MED ORDER — FLUCONAZOLE 150 MG PO TABS: ORAL_TABLET | ORAL | Status: DC

## 2013-12-24 MED ORDER — AMITRIPTYLINE HCL 25 MG PO TABS: 12.5000 mg | ORAL_TABLET | Freq: Every day | ORAL | Status: DC

## 2013-12-24 NOTE — Progress Notes (Signed)
Bridget [name] - 53 y.o. female MRN [id number]  Date of birth: 1961-02-13  CC: Medical Management of Chronic Issues   SUBJECTIVE:     HPI Comments: Patient presents with:   Medical Management of Chronic Issues - exercise - 30X5, now 60X5 bike and treadmill; not on insulin in past 1 month  Patient presents today for followup of her diabetes, hyperlipidemia, hypertension, hypothyroidism and obesity.  Reports that overall she is doing much better even though she continues to have significant stress at work.  She has incorporated a significant amount of exercise into her daily routine and is now exercising as above.  She has been able to fill Invokana and notes that this seems to be much better.  She does report however she occasionally will have some vaginal itching and discharge and would like a refill to have on hand for yeast vaginitis.  Pt denies chest pain, dyspnea at rest or exertion, PND, lower extremity edema.Patient denies any facial asymmetry, unilateral weakness, or dysarthria.Pt denies hypoglycemic symptoms/episodes.  No reported polyuria/polydipsia. Pt is compliant with foot exams and denies any new foot lesions or new sensory changes/dysesthesias.  She reports overall she has been doing all of her medications except for her insulin that she stopped due to the improved control See problem based charting for additional problem specific subjective (including HPI, Interval History & ROS)   HISTORY: Harlie's major active medical problems include: # Endocrine: DM, Hypothyroid, Obesity  Working on dietary changes and medication management  Latest hemoglobin A1c on 11/14/2013 at [facility] wellness visit =  7.4 # CV: HTN  No prior CAD/CVA # SLE & Pain and MSK: Diabetic Neuropathy, Osteoarthritis of Multiple Sights (Back, Knees, Ankles, chronic venous insufficiency)  Previously treated by Dr. Corliss Skains, not wishing to for DMARD therapy  Other Pertinent History: # Abdominal  Hysterectomy  Other Care Providers include:  Link to [facility])  Follow up Issues:  > Needs repeat CT of the lung.  Finding on January of 2014 from abdominal CT with nodule   Health Maintenance Due  Topic  . Foot Exam   . Urine Microalbumin     Recent Labs  02/06/13 0833 05/10/13 0856 05/17/13 0847 12/24/13 0941  HGBA1C 8.0 7.3  --  6.8  TRIG  --   --  124  --   CHOL  --   --  193  --   HDL  --   --  44  --   LDLCALC  --   --  124*  --   TSH  --   --  2.477  --   FREET4  --   --  1.09  --   } History  Smoking status  . Former Smoker -- 0.30 packs/day  . Types: Cigarettes  . Quit date: 08/29/1998  Smokeless tobacco  . Never Used    Comment: Quit in 2000    Otherwise past Medical, Surgical, Social, and Family History Reviewed per EMR Medications and Allergies reviewed and updated per below.  OBJECTIVE: VITALS: BP: 123/72 mmHg  HR: 87 bpm  TEMP: 98.6 F (37 C) (Oral)  RESP:    HT:    WT: 234 lb (106.142 kg)  BMI:     BP Readings from Last 3 Encounters:  12/24/13 123/72  11/21/13 134/74  09/27/13 132/91   Wt Readings from Last 3 Encounters:  12/24/13 234 lb (106.142 kg)  11/21/13 237 lb (107.502 kg)  09/27/13 240 lb 8 oz (109.09 kg)  Physical Exam  Vitals reviewed. Constitutional: She is well-developed, well-nourished, and in no distress.  HENT:  Head: Normocephalic and atraumatic.  Right Ear: External ear normal.  Left Ear: External ear normal.  Eyes: Pupils are equal, round, and reactive to light. Right eye exhibits no discharge. Left eye exhibits no discharge.  Neck: Normal range of motion. Neck supple. No JVD present. No tracheal deviation present. No thyromegaly present.  Cardiovascular: Normal rate, regular rhythm and normal heart sounds.  Exam reveals no gallop and no friction rub.   No murmur heard. Pulmonary/Chest: Effort normal and breath sounds normal. No respiratory distress.  Abdominal: She exhibits no distension. There is no  tenderness.  Musculoskeletal: She exhibits no tenderness.  There are no foot lesions.  Monofilament testing is intact throughout bilateral LE.  Pulses 1+/4.  Trace LE edema  Neurological: She is alert.  Moves all 4 extremities spontaneously; no lateralization.  Skin: Skin is warm and dry. She is not diaphoretic.  Psychiatric: Mood, memory, affect and judgment normal.   MEDICATIONS, LABS & OTHER ORDERS: Previous Medications   ALCOHOL SWABS (ALCOHOL PREPS) PADS    1 Device by Does not apply route 4 (four) times daily -  before meals and at bedtime.   ASPIRIN 81 MG TABLET    Take 1 tablet (81 mg total) by mouth daily.   ATORVASTATIN (LIPITOR) 40 MG TABLET    Take 1 tablet (40 mg total) by mouth daily.   CANAGLIFLOZIN (INVOKANA) 300 MG TABS    Take 1 tablet (300 mg total) by mouth daily.   CETIRIZINE (ZYRTEC) 10 MG TABLET    Take 1 tablet (10 mg total) by mouth daily.   DICLOFENAC (VOLTAREN) 75 MG EC TABLET    Take 1 tablet (75 mg total) by mouth 2 (two) times daily.   DULOXETINE (CYMBALTA) 30 MG CAPSULE    Take 1 capsule (30 mg total) by mouth daily.   GLIMEPIRIDE (AMARYL) 4 MG TABLET    Take 1 tablet (4 mg total) by mouth daily with breakfast.   LISINOPRIL (PRINIVIL,ZESTRIL) 5 MG TABLET    TAKE 1 TABLET BY MOUTH ONCE DAILY   ONDANSETRON (ZOFRAN ODT) 8 MG DISINTEGRATING TABLET    Take 1 tablet (8 mg total) by mouth every 8 (eight) hours as needed for nausea or vomiting.   PROMETHAZINE (PHENERGAN) 12.5 MG TABLET    Take 1-2 tablets (12.5-25 mg total) by mouth every 6 (six) hours as needed for nausea or refractory nausea / vomiting.   SITAGLIPTIN (JANUVIA) 100 MG TABLET    Take 100 mg by mouth daily.   TRIAMCINOLONE (NASACORT AQ) 55 MCG/ACT AERO NASAL INHALER    Place 2 sprays into the nose daily.   TRUETEST TEST TEST STRIP    USE AS INSTRUCTED   Modified Medications   Modified Medication Previous Medication   AMITRIPTYLINE (ELAVIL) 25 MG TABLET amitriptyline (ELAVIL) 25 MG tablet      Take  0.5 tablets (12.5 mg total) by mouth at bedtime.    Take 1 tablet (25 mg total) by mouth at bedtime.   FLUCONAZOLE (DIFLUCAN) 150 MG TABLET fluconazole (DIFLUCAN) 150 MG tablet      Take 1 tab (150mg ) today then repeat again in 3 days.    Take 1 tab (150mg ) today then repeat again in 3 days.   New Prescriptions   No medications on file   Discontinued Medications   ATORVASTATIN (LIPITOR) 40 MG TABLET    Take 40 mg by mouth daily.  INSULIN GLARGINE (LANTUS SOLOSTAR) 100 UNIT/ML SOPN    Inject 12 Units into the skin at bedtime.   ULTICARE INSULIN SYRINGE 30G X 5/16" 0.3 ML MISC    USE AS DIRECTED WITH LANTUS ONCE DAILY   Orders Placed This Encounter  Procedures  . POCT HgB A1C   ASSESSMENT & PLAN: See problem based charting & AVS for pt instructions.

## 2013-12-24 NOTE — Patient Instructions (Signed)
Try taking 1/2 a tab of amitriptyline before bed to see if this is better.  Refilled diflucan for as needed.  Keep taking the Azo as well GREAT JOB ON GETTING YOUR A1C, WEIGHT, BP DOWN!  Keep doing the exercises.  Here are some basic exercise recommendations to remember:  Try to be active every day and throughout the day.    We have actually found that being active throughout the day is likely more important than getting to the gym 5 days per week.  Minimizing being in active should be an important health goal we all are working towards.  A basic starting point can be limiting the time that you sit or lay still during the day.  You should sit/lay/lounge for no longer than 20-30 minutes at a time if you are able.  Even interrupting sitting with standing/jumping jacks/dancing/etc for one minute can have significant health benefits.   Try to remember: "Why sit when you can stand, why stand when you can walk, why walk when you can run."  The point he is to look for opportunities during the day where you can increase your heart rate.    Ideally, I recommend you exercise for at least 30 minutes per day, 5 days per week.  This would involve any activity that will elevate your heart rate to the point that you have a hard time carrying on a normal conversation, but not to the point of being completely out of breath.  There are alternative options however this is a generally good goal to strive for.  You can adjust your intensity based on heart rate (HR).  To calculate your target HR take 220 minus your age, then multiply X 0.7 (70%).  Example for 53 year old:  220 - 40 = 180;  180 X 0.7 = 126  Try to keep your HR within 10 beats of this target throughout your exercise   I am always happy to talk more about "Exercise as medicine" if you are interested"   Here are some basic nutrition rules to remember:  "Eat Real Foods & Drink Real Drinks" - if you think it was made in a factory . . it is best to  avoid as a staple in your diet.  Limiting processed  foods to 1-2 times per week is a good idea.  Food that came from the ground, from a tree, from a plant or is a plant, is food that you can essentially eat as much of it he would like as long as it looks like it did when it came from that place!!!  I've never seen a french fry come out of the ground!  Obviously everything should be eaten in moderation but this can be generally applied.   Limit your salt intake (in general aim for less than 3000mg  per day). Sticking with fresh fruits and vegetables as well as home cooked meals will typically provide more nutrition and less salt than prepackaged meals.     Limit the amount of sugar sweetened and artificially sweetened foods and beverages.  Avoid soda, juices and generally any bottled beverage is a good idea.  Sticking with water flavored with a slice of lemon, lime or orange is a great option if you want something with flavor in it.  Using flavored seltzer water to flavor plain water will also add some bite if you want something more than flavor.      Eat at least 3 meals and 1-2 snacks per day.  Aim for no more than 5 hours between eating.  This will actually increase your metabolism and help prevent you from overeating.   Here are 2 of my favorite web sites that provide great nutrition and exercise advice.   www.eats[place]movemore[place].com Www.choosemyplate.gov

## 2013-12-29 NOTE — Assessment & Plan Note (Signed)
Seen on chart review following appointment. Will place an order and will contact the patient to discuss these findings.

## 2013-12-29 NOTE — Assessment & Plan Note (Signed)
Chronic, remarkably improved condition. A1c 6.8 today - Currently on lisinopril, Lipitor, Invokana, Amaryl and Januvia.  Has stopped Lantus. 1. Continue Oral Meds.  Continue off Lantus 2. Continue TLC (Therapeutic Lifestyle Changes).  Congratulated and encouraged to continue increasing activity  >50% of this 25 minute visit spent in direct patient counseling and/or coordination of care.

## 2014-01-31 ENCOUNTER — Telehealth: Payer: Self-pay | Admitting: Sports Medicine

## 2014-01-31 DIAGNOSIS — R911 Solitary pulmonary nodule: Secondary | ICD-10-CM

## 2014-01-31 MED ORDER — DOXYCYCLINE HYCLATE 100 MG PO TABS: 100.0000 mg | ORAL_TABLET | Freq: Two times a day (BID) | ORAL | Status: DC

## 2014-01-31 NOTE — Addendum Note (Signed)
Addended by: Gaspar Bidding D on: 01/31/2014 04:41 PM   Modules accepted: Orders

## 2014-01-31 NOTE — Telephone Encounter (Signed)
Returned call to patient.  She reports nasal congestion, subjective fevers and sinus pressure that has been progressively worsening for 8+ days.    Not been taking a any OTC llergy medications due to somnolence; still on Nasacort. Subjective fevers; some night sweats.  Given the known history of recurrent sinus infections I will treat with 10 day course of antibiotics.   I also took the opportunity to discuss followup imaging for her pulmonary nodule seen.  She is agreeable to having this done next week and I've requested she follow up with me after this to discuss the results and ensure she is improving.

## 2014-01-31 NOTE — Telephone Encounter (Signed)
Pt called and would like a antibiotic called in. Her allergies and chest cold, cough. Is really doing a number on her. jw

## 2014-01-31 NOTE — Telephone Encounter (Signed)
Patient complains of URI symptoms,sore throat unable to swallow and severe rib cage pain from coughing.Patient was advised to schedule an appointment to be seen,She refuses to schedule appointment/She states she doesn't feel like coming in.At that point I  informed her  that patients need to be seen before treating them. I relayed message to Dr Berline Chough and he request message sent to him.Bridget [name]

## 2014-02-03 NOTE — Telephone Encounter (Signed)
Patient was schedule for a  Ct of the chest per Dr Berline Chough on Friday the 12th of June @ 10:00 [name].patient was informed of appointment and instructed her to schedule a follow up with Dr Berline Chough.She voiced understanding.Bridget [name] [name]

## 2014-02-07 ENCOUNTER — Ambulatory Visit (HOSPITAL_COMMUNITY)
Admission: RE | Admit: 2014-02-07 | Discharge: 2014-02-07 | Disposition: A | Discharge status: Home / Self Care | Payer: 59 | Source: Ambulatory Visit | Attending: Family Medicine | Admitting: Family Medicine

## 2014-02-07 DIAGNOSIS — R911 Solitary pulmonary nodule: Secondary | ICD-10-CM | POA: Insufficient documentation

## 2014-02-11 ENCOUNTER — Telehealth: Payer: Self-pay | Admitting: Sports Medicine

## 2014-02-11 ENCOUNTER — Encounter: Payer: Self-pay | Admitting: Sports Medicine

## 2014-02-11 NOTE — Telephone Encounter (Signed)
Post results on "MY Chart".  Patient will be on vacation next Tuesday and won't be back until Sunday.

## 2014-02-12 ENCOUNTER — Ambulatory Visit: Payer: 59 | Admitting: Sports Medicine

## 2014-02-12 NOTE — Telephone Encounter (Signed)
Called and left a message on patient's voice mail on June 16 and released these records in the chart

## 2014-04-08 ENCOUNTER — Other Ambulatory Visit: Payer: Self-pay | Admitting: Sports Medicine

## 2014-04-30 ENCOUNTER — Ambulatory Visit (INDEPENDENT_AMBULATORY_CARE_PROVIDER_SITE_OTHER): Payer: 59 | Admitting: Family Medicine

## 2014-04-30 ENCOUNTER — Encounter: Payer: Self-pay | Admitting: Family Medicine

## 2014-04-30 VITALS — BP 129/79 | HR 90 | Temp 98.2°F | Wt 230.7 lb

## 2014-04-30 DIAGNOSIS — M329 Systemic lupus erythematosus, unspecified: Secondary | ICD-10-CM

## 2014-04-30 NOTE — Assessment & Plan Note (Signed)
FMLA paperwork filled out today.  Highly recommend returning to Dr. Corliss Skains for further evaluation and possible evaluation for Fibromyalgia?

## 2014-04-30 NOTE — Progress Notes (Signed)
Bridget [name] is a 53 y.o. female who presents today for FMLA paperwork.  Pt has complex medical history that sounds like she was dx/maybe not dx with SLE a couple years ago.  She has severe joint pains in her fingers and knees at times along with fatigue.  She has been on Plaquenil prior and has seen Dr. Corliss Skains at [facility] for rheumatology w/u in the past as well.    Past Medical History  Diagnosis Date  . Diabetes mellitus without complication   . Osteoarthritis   . GERD (gastroesophageal reflux disease)   . Diverticulosis   . Glaucoma   . Obesity   . Vaginal yeast infection 12/08/2010  . SHOULDER PAIN, LEFT 05/19/2010    Qualifier: Diagnosis of  By: Clotilde Dieter MD, Amber    . Retained tampon 05/21/2012  . OSTEOARTHRITIS, KNEE 04/27/2010  . OBESITY, NOS 10/26/2006  . MALAISE AND FATIGUE 05/27/2010  . HEEL SPUR 09/10/2007    Qualifier: Diagnosis of  By: Lanier Prude  MD, Cathrine Muster    . DIVERTICULOSIS, COLON 10/26/2006  . Bacterial vaginosis 12/07/2010    History  Smoking status  . Former Smoker -- 0.30 packs/day  . Types: Cigarettes  . Quit date: 08/29/1998  Smokeless tobacco  . Never Used    Comment: Quit in 2000     Family History  Problem Relation Age of Onset  . Breast cancer Mother   . Ovarian cancer Maternal Grandmother   . Stomach cancer Cousin   . Diabetes Mother   . Heart disease Maternal Grandmother     great  . Diabetes Brother     x 3    Current Outpatient Prescriptions on File Prior to Visit  Medication Sig Dispense Refill  . Alcohol Swabs (ALCOHOL PREPS) PADS 1 Device by Does not apply route 4 (four) times daily -  before meals and at bedtime.  100 each  11  . amitriptyline (ELAVIL) 25 MG tablet Take 0.5 tablets (12.5 mg total) by mouth at bedtime.  30 tablet  2  . aspirin 81 MG tablet Take 1 tablet (81 mg total) by mouth daily.      Marland Kitchen atorvastatin (LIPITOR) 40 MG tablet Take 1 tablet (40 mg total) by mouth daily.  90 tablet  3  . cetirizine (ZYRTEC) 10  MG tablet Take 1 tablet (10 mg total) by mouth daily.  30 tablet  11  . diclofenac (VOLTAREN) 75 MG EC tablet Take 1 tablet (75 mg total) by mouth 2 (two) times daily.  60 tablet  2  . doxycycline (VIBRA-TABS) 100 MG tablet Take 1 tablet (100 mg total) by mouth 2 (two) times daily.  20 tablet  0  . DULoxetine (CYMBALTA) 30 MG capsule Take 1 capsule (30 mg total) by mouth daily.  30 capsule  5  . fluconazole (DIFLUCAN) 150 MG tablet Take 1 tab ( ) today then repeat again in 3 days.  2 tablet  0  . glimepiride (AMARYL) 4 MG tablet TAKE 1 TABLET (4 MG TOTAL) BY MOUTH DAILY WITH BREAKFAST.  30 tablet  2  . INVOKANA 300 MG TABS TAKE 1 TABLET BY MOUTH DAILY.  30 tablet  2  . lisinopril (PRINIVIL,ZESTRIL) 5 MG tablet TAKE 1 TABLET BY MOUTH ONCE DAILY  30 tablet  11  . ondansetron (ZOFRAN ODT) 8 MG disintegrating tablet Take 1 tablet (8 mg total) by mouth every 8 (eight) hours as needed for nausea or vomiting.  20 tablet  0  . promethazine (PHENERGAN) 12.5  MG tablet Take 1-2 tablets (12.5-25 mg total) by mouth every 6 (six) hours as needed for nausea or refractory nausea / vomiting.  30 tablet  1  . sitaGLIPtin (JANUVIA) 100 MG tablet Take 100 mg by mouth daily.      Marland Kitchen triamcinolone (NASACORT AQ) 55 MCG/ACT AERO nasal inhaler Place 2 sprays into the nose daily.  1 Inhaler  12  . TRUETEST TEST test strip USE AS INSTRUCTED  100 each  11   No current facility-administered medications on file prior to visit.    ROS: Per HPI.  All other systems reviewed and are negative.   Physical Exam Filed Vitals:   04/30/14 0850  BP: 129/79  Pulse: 90  Temp: 98.2 F (36.8 C)    Physical Examination: General appearance - alert, well appearing, and in no distress Heart - normal rate and regular rhythm, no murmurs noted

## 2014-05-07 ENCOUNTER — Other Ambulatory Visit: Payer: Self-pay | Admitting: Family Medicine

## 2014-05-07 DIAGNOSIS — G8929 Other chronic pain: Secondary | ICD-10-CM

## 2014-05-07 DIAGNOSIS — M329 Systemic lupus erythematosus, unspecified: Secondary | ICD-10-CM

## 2014-05-07 DIAGNOSIS — R52 Pain, unspecified: Secondary | ICD-10-CM

## 2014-05-07 MED ORDER — CYCLOBENZAPRINE HCL 5 MG PO TABS: 5.0000 mg | ORAL_TABLET | Freq: Three times a day (TID) | ORAL | Status: AC | PRN

## 2014-05-07 NOTE — Telephone Encounter (Signed)
This medication was prescribed in 11/2011.  Please verify why patient is needing this.  This likely will require an appointment.  Thank you.

## 2014-05-07 NOTE — Telephone Encounter (Addendum)
Patient is requesting flexeril given to her in 2013 by Dr Tye Savoy for spasms associated with her SLE.  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  She was seen this month for evaluation/ disability forms.  I will refill a small supply with the understanding that patient will see myself or another provider in office for SLE management soon.  Patient denies increased drowsiness from medication.  Ashly M. Nadine Counts, DO PGY-1, [facility]

## 2014-05-07 NOTE — Telephone Encounter (Deleted)
Error in last note.  RX was given by Dr Tye Savoy not Dr Berline Chough.

## 2014-05-07 NOTE — Telephone Encounter (Signed)
Pt stated she taking medication for muscle spasm and joint pain.  Medication was prescribed by Dr. Berline Chough. No appointments available with PCP.  Pt work third shift and need a morning appointment.  Flexeril does not make patient drownsy.  Clovis Pu, RN

## 2014-05-09 ENCOUNTER — Telehealth: Payer: Self-pay | Admitting: Family Medicine

## 2014-05-09 NOTE — Telephone Encounter (Signed)
Called patient with regards to [facility] paperwork.  They need to know how long SLE flare ups last.  I left a voicemail asking Ms Sandora to call us back with information as to how long her episodes last.    Bridget [name]. Nadine Counts, DO

## 2014-05-12 ENCOUNTER — Telehealth: Payer: Self-pay | Admitting: Family Medicine

## 2014-05-12 NOTE — Telephone Encounter (Signed)
FMLA paperwork has to be faxed in today.  Dr Paulina Fusi had filled it out but left out the duration Dr Nadine Counts needs to complete the duration and fax it back today.  According to letter received by pt, the paperwork has been received The duration time for flareups- it can be up to a month

## 2014-05-12 NOTE — Telephone Encounter (Signed)
I will likely not get out of peds until 5.  If you wouldn't mind, I'd sincerely appreciate you simply putting 1 month in the "duration" field on the paperwork (it's in my box).  Dr Paulina Fusi did a great job of filling everything else out, so everything should be set!  Please let me know if you have any questions.  Cell: [phone].

## 2014-05-12 NOTE — Telephone Encounter (Signed)
I will not be in clinic today but if Dr. Nadine Counts cannot fill this out, please write one month in duration time and fax back to the number provided.  Thanks, Twana First. Paulina Fusi, DO of [facility] 05/12/2014, 11:37 AM

## 2014-05-12 NOTE — Telephone Encounter (Signed)
Please advise.Thank you.Bridget [name] S  

## 2014-05-13 ENCOUNTER — Telehealth: Payer: Self-pay | Admitting: Family Medicine

## 2014-05-13 NOTE — Telephone Encounter (Signed)
Pt needs flexeril  Refilled. It shows it was printed Sept 9 but it is not up front or in the log book Please advise

## 2014-05-21 ENCOUNTER — Encounter: Payer: Self-pay | Admitting: Internal Medicine

## 2014-05-21 ENCOUNTER — Other Ambulatory Visit (INDEPENDENT_AMBULATORY_CARE_PROVIDER_SITE_OTHER): Payer: 59

## 2014-05-21 ENCOUNTER — Ambulatory Visit (INDEPENDENT_AMBULATORY_CARE_PROVIDER_SITE_OTHER): Payer: 59 | Admitting: Internal Medicine

## 2014-05-21 VITALS — BP 128/82 | HR 90 | Temp 98.3°F | Resp 18 | Ht 65.5 in | Wt 228.8 lb

## 2014-05-21 DIAGNOSIS — E039 Hypothyroidism, unspecified: Secondary | ICD-10-CM

## 2014-05-21 DIAGNOSIS — E1165 Type 2 diabetes mellitus with hyperglycemia: Secondary | ICD-10-CM

## 2014-05-21 DIAGNOSIS — R52 Pain, unspecified: Secondary | ICD-10-CM

## 2014-05-21 DIAGNOSIS — E669 Obesity, unspecified: Secondary | ICD-10-CM

## 2014-05-21 DIAGNOSIS — M329 Systemic lupus erythematosus, unspecified: Secondary | ICD-10-CM

## 2014-05-21 DIAGNOSIS — E119 Type 2 diabetes mellitus without complications: Secondary | ICD-10-CM

## 2014-05-21 DIAGNOSIS — E785 Hyperlipidemia, unspecified: Secondary | ICD-10-CM

## 2014-05-21 DIAGNOSIS — I10 Essential (primary) hypertension: Secondary | ICD-10-CM

## 2014-05-21 DIAGNOSIS — IMO0001 Reserved for inherently not codable concepts without codable children: Secondary | ICD-10-CM

## 2014-05-21 DIAGNOSIS — G8929 Other chronic pain: Secondary | ICD-10-CM

## 2014-05-21 LAB — LIPID PANEL
Cholesterol: 199 mg/dL (ref 0–200)
HDL: 40.7 mg/dL (ref >39.00)
LDL Cholesterol: 119 mg/dL — ABNORMAL HIGH (ref 0–99)
NONHDL: 158.3
Total CHOL/HDL Ratio: 5
Triglycerides: 198 mg/dL — ABNORMAL HIGH (ref 0.0–149.0)
VLDL: 39.6 mg/dL (ref 0.0–40.0)

## 2014-05-21 LAB — BASIC METABOLIC PANEL
BUN: 16 mg/dL (ref 6–23)
CO2: 27 mEq/L (ref 19–32)
CREATININE: 1.1 mg/dL (ref 0.4–1.2)
Calcium: 9.8 mg/dL (ref 8.4–10.5)
Chloride: 104 mEq/L (ref 96–112)
GFR: 67.9 mL/min (ref >60.00)
Glucose, Bld: 162 mg/dL — ABNORMAL HIGH (ref 70–99)
POTASSIUM: 3.8 meq/L (ref 3.5–5.1)
Sodium: 139 mEq/L (ref 135–145)

## 2014-05-21 LAB — TSH: TSH: 1.83 u[IU]/mL (ref 0.35–4.50)

## 2014-05-21 LAB — HEMOGLOBIN A1C: HEMOGLOBIN A1C: 6.6 % — AB (ref 4.6–6.5)

## 2014-05-21 MED ORDER — CYCLOBENZAPRINE HCL 5 MG PO TABS: 5.0000 mg | ORAL_TABLET | Freq: Three times a day (TID) | ORAL | Status: DC | PRN

## 2014-05-21 MED ORDER — AMITRIPTYLINE HCL 25 MG PO TABS: 12.5000 mg | ORAL_TABLET | Freq: Every day | ORAL | Status: DC

## 2014-05-21 MED ORDER — DULOXETINE HCL 60 MG PO CPEP: 60.0000 mg | ORAL_CAPSULE | Freq: Every day | ORAL | Status: DC

## 2014-05-21 NOTE — Assessment & Plan Note (Signed)
Unclear history and not on medicines. Will try to review records and check TSH today.

## 2014-05-21 NOTE — Assessment & Plan Note (Signed)
Patient on ACE-I, januvia, invokana, amaryl. Last HgA1c good and will recheck today. Foot exam done.

## 2014-05-21 NOTE — Assessment & Plan Note (Signed)
She is working on losing weight and encouraged that to continue and to increase her exercise as she is able.

## 2014-05-21 NOTE — Progress Notes (Signed)
Pre visit review using our clinic review tool, if applicable. No additional management support is needed unless otherwise documented below in the visit note. 

## 2014-05-21 NOTE — Assessment & Plan Note (Signed)
BP okay and will adjust as needed. Check BMP today.

## 2014-05-21 NOTE — Assessment & Plan Note (Signed)
Check lipid panel. Not taking her lipitor for some time.

## 2014-05-21 NOTE — Patient Instructions (Addendum)
We are going to check some blood work on you today. We will call you with the results and make any changes to your medicines as needed.   We would like you to start taking cymbalta again. Take 1 pill per day. This will help with the mood and hopefully some of the pain as well. You may not notice the full benefits for a couple of weeks.   If you have symptoms that you associate with lupus please come into the office so we can evaluate you.  Come back in about 6 weeks so we can see how your mood is doing.  Exercise to Stay Healthy Exercise helps you become and stay healthy. EXERCISE IDEAS AND TIPS Choose exercises that:  You enjoy.  Fit into your day. You do not need to exercise really hard to be healthy. You can do exercises at a slow or medium level and stay healthy. You can:  Stretch before and after working out.  Try yoga, Pilates, or tai chi.  Lift weights.  Walk fast, swim, jog, run, climb stairs, bicycle, dance, or rollerskate.  Take aerobic classes. Exercises that burn about 150 calories:  Running 1  miles in 15 minutes.  Playing volleyball for 45 to 60 minutes.  Washing and waxing a car for 45 to 60 minutes.  Playing touch football for 45 minutes.  Walking 1  miles in 35 minutes.  Pushing a stroller 1  miles in 30 minutes.  Playing basketball for 30 minutes.  Raking leaves for 30 minutes.  Bicycling 5 miles in 30 minutes.  Walking 2 miles in 30 minutes.  Dancing for 30 minutes.  Shoveling snow for 15 minutes.  Swimming laps for 20 minutes.  Walking up stairs for 15 minutes.  Bicycling 4 miles in 15 minutes.  Gardening for 30 to 45 minutes.  Jumping rope for 15 minutes.  Washing windows or floors for 45 to 60 minutes. Document Released: 09/17/2010 Document Revised: 11/07/2011 Document Reviewed: 09/17/2010 [facility] Patient Information 2015 [place], [place]. This information is not intended to replace advice given to you by your health care  provider. Make sure you discuss any questions you have with your health care provider.

## 2014-05-21 NOTE — Assessment & Plan Note (Signed)
Previously concern for fibromyalgia. She was on cymbalta and did well with it. Unclear why she is not taking it now. Will resume cymbalta to help with her mood and her pain symptoms.

## 2014-05-21 NOTE — Progress Notes (Signed)
   Subjective:    Patient ID: Bridget [name], female    DOB: [birth year], 53 y.o.   MRN: [id number]  HPI The patient is a 53 YO female who is coming in today to establish care. She is frustrated with her previous provider and we talk about her PMH. She has DM type 2, HTN, hypothyroidism (not on medicine), some question of lupus earlier in life, obestity, OA, pain. She is having some pain in her lower back and abdomen which does not radiate and is throbbing. She is able to take flexeril which helps to pain. 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 She is able to function and she used to exercise but recently has had troubles getting her medicines. She denies chest pains, SOB. She denies nausea, diarrhea, constipation. She denies vaginal discharge or pain, dysuria, yeast infection.   Review of Systems  Constitutional: Positive for activity change. Negative for fever, appetite change and fatigue.  HENT: Negative.   Eyes: Negative.   Respiratory: Negative for cough, chest tightness, shortness of breath and wheezing.   Cardiovascular: Negative for chest pain, palpitations and leg swelling.  Gastrointestinal: Positive for abdominal pain. Negative for nausea, diarrhea and constipation.  Genitourinary: Negative for dysuria, frequency, flank pain, vaginal discharge and vaginal pain.  Musculoskeletal: Positive for arthralgias and myalgias. Negative for gait problem.  Skin: Negative.   Neurological: Negative for dizziness, weakness and light-headedness.      Objective:   Physical Exam  Constitutional: She is oriented to person, place, and time. She appears well-developed and well-nourished.  HENT:  Head: Normocephalic and atraumatic.  Eyes: EOM are normal.  Neck: Normal range of motion.  Cardiovascular: Normal rate and regular rhythm.   Pulmonary/Chest: Effort normal and breath sounds normal. No respiratory distress. She has no wheezes. She has no rales.  Abdominal: Soft.  Bowel sounds are normal. She exhibits no distension. There is no rebound and no guarding.  Minimal tenderness on lower lumbar paraspinal region and low right quadrant.   Neurological: She is alert and oriented to person, place, and time. Coordination normal.  Skin: Skin is warm and dry.  See foot exam   Filed Vitals:   05/21/14 0951  BP: 128/82  Pulse: 90  Temp: 98.3 F (36.8 C)  TempSrc: Oral  Resp: 18  Height: 5' 5.5" (1.664 m)  Weight: 228 lb 12.8 oz (103.783 kg)  SpO2: 97%      Assessment & Plan:

## 2014-05-21 NOTE — Assessment & Plan Note (Signed)
Unclear history and no flares at this time. If she has a flare she will return to our office for evaluation. She was previously on plaquenil.

## 2014-06-23 ENCOUNTER — Encounter: Payer: Self-pay | Admitting: Internal Medicine

## 2014-06-23 ENCOUNTER — Ambulatory Visit (INDEPENDENT_AMBULATORY_CARE_PROVIDER_SITE_OTHER): Payer: 59 | Admitting: Internal Medicine

## 2014-06-23 ENCOUNTER — Other Ambulatory Visit: Payer: Self-pay | Admitting: Family Medicine

## 2014-06-23 ENCOUNTER — Other Ambulatory Visit: Payer: Self-pay | Admitting: Sports Medicine

## 2014-06-23 VITALS — BP 110/78 | HR 74 | Temp 98.4°F | Resp 12 | Ht 65.0 in | Wt 229.0 lb

## 2014-06-23 DIAGNOSIS — F32A Depression, unspecified: Secondary | ICD-10-CM

## 2014-06-23 DIAGNOSIS — G8929 Other chronic pain: Secondary | ICD-10-CM

## 2014-06-23 DIAGNOSIS — E669 Obesity, unspecified: Secondary | ICD-10-CM

## 2014-06-23 DIAGNOSIS — R52 Pain, unspecified: Secondary | ICD-10-CM

## 2014-06-23 DIAGNOSIS — F329 Major depressive disorder, single episode, unspecified: Secondary | ICD-10-CM

## 2014-06-23 MED ORDER — INVOKANA 300 MG PO TABS: ORAL_TABLET | ORAL | Status: DC

## 2014-06-23 MED ORDER — GLIMEPIRIDE 4 MG PO TABS: ORAL_TABLET | ORAL | Status: DC

## 2014-06-23 MED ORDER — SITAGLIPTIN PHOSPHATE 100 MG PO TABS: 100.0000 mg | ORAL_TABLET | Freq: Every day | ORAL | Status: DC

## 2014-06-23 MED ORDER — PREGABALIN 75 MG PO CAPS: 75.0000 mg | ORAL_CAPSULE | Freq: Two times a day (BID) | ORAL | Status: DC

## 2014-06-23 NOTE — Progress Notes (Signed)
Pre visit review using our clinic review tool, if applicable. No additional management support is needed unless otherwise documented below in the visit note. 

## 2014-06-23 NOTE — Assessment & Plan Note (Signed)
Doing much better on the cymbalta and encouraged her to continue taking it.

## 2014-06-23 NOTE — Patient Instructions (Signed)
We would like you to keep taking the cymbalta every day to help with the pain and with your mood.   In addition we will have you try lyrica for pain. You can take 1 pill up to 2 times per day for pain. You should know if it will work for you within the first few days. This is a safer medicine and should not make you as drowsy.   Come back in about 2-3 months so we can follow up on the pain.

## 2014-06-23 NOTE — Assessment & Plan Note (Signed)
Seems to be more like fibromyalgia and given her partial relief with cymbalta will trial lyrica 75 mg BID for pain control and see if this helps.

## 2014-06-23 NOTE — Assessment & Plan Note (Signed)
Advised that she needs to exercise for her mental and physical health. She tried water aerobics although she liked it and it helped with pain she is unable to go regularly.

## 2014-06-23 NOTE — Progress Notes (Signed)
   Subjective:    Patient ID: Bridget [name], female    DOB: 1961-07-15, 53 y.o.   MRN: [id number]  HPI The patient is a 53 YO female who is coming in today to follow up on starting cymbalta. She has done well with it and noticed some improvements in her mood and some small improvements in pain levels. She tried water aerobics and did very well with it (helped her pain a lot) but she is unable to afford to go regularly. She denies other new problems. She remembers being tried on celebrex in the past without much success. She is able to take flexeril which helps to pain. 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 She denies chest pains, SOB. She denies nausea, diarrhea, constipation.   Review of Systems  Constitutional: Positive for activity change. Negative for fever, appetite change and fatigue.  HENT: Negative.   Eyes: Negative.   Respiratory: Negative for cough, chest tightness, shortness of breath and wheezing.   Cardiovascular: Negative for chest pain, palpitations and leg swelling.  Gastrointestinal: Negative for nausea, diarrhea and constipation.  Genitourinary: Negative for dysuria, frequency, flank pain, vaginal discharge and vaginal pain.  Musculoskeletal: Positive for arthralgias and myalgias. Negative for gait problem.  Skin: Negative.   Neurological: Negative for dizziness, weakness and light-headedness.      Objective:   Physical Exam  Constitutional: She is oriented to person, place, and time. She appears well-developed and well-nourished.  Obese  HENT:  Head: Normocephalic and atraumatic.  Eyes: EOM are normal.  Neck: Normal range of motion.  Cardiovascular: Normal rate and regular rhythm.   Pulmonary/Chest: Effort normal and breath sounds normal. No respiratory distress. She has no wheezes. She has no rales.  Abdominal: Soft. Bowel sounds are normal. She exhibits no distension. There is no rebound and no guarding.  Neurological: She is alert and  oriented to person, place, and time. Coordination normal.  Skin: Skin is warm and dry.   Filed Vitals:   06/23/14 0835  BP: 110/78  Pulse: 74  Temp: 98.4 F (36.9 C)  TempSrc: Oral  Resp: 12  Height: 5\' 5"  (1.651 m)  Weight: 229 lb (103.874 kg)  SpO2: 98%      Assessment & Plan:

## 2014-09-06 ENCOUNTER — Encounter: Payer: 59 | Attending: Internal Medicine

## 2014-09-06 VITALS — Ht 65.0 in | Wt 237.1 lb

## 2014-09-06 DIAGNOSIS — E118 Type 2 diabetes mellitus with unspecified complications: Secondary | ICD-10-CM | POA: Diagnosis not present

## 2014-09-06 DIAGNOSIS — Z713 Dietary counseling and surveillance: Secondary | ICD-10-CM | POA: Insufficient documentation

## 2014-09-08 ENCOUNTER — Ambulatory Visit (INDEPENDENT_AMBULATORY_CARE_PROVIDER_SITE_OTHER): Payer: 59 | Admitting: Internal Medicine

## 2014-09-08 ENCOUNTER — Encounter: Payer: Self-pay | Admitting: Internal Medicine

## 2014-09-08 VITALS — BP 110/62 | HR 81 | Temp 98.4°F | Resp 12 | Ht 65.5 in | Wt 238.0 lb

## 2014-09-08 DIAGNOSIS — R911 Solitary pulmonary nodule: Secondary | ICD-10-CM

## 2014-09-08 DIAGNOSIS — K219 Gastro-esophageal reflux disease without esophagitis: Secondary | ICD-10-CM

## 2014-09-08 DIAGNOSIS — R52 Pain, unspecified: Secondary | ICD-10-CM

## 2014-09-08 DIAGNOSIS — E118 Type 2 diabetes mellitus with unspecified complications: Secondary | ICD-10-CM

## 2014-09-08 DIAGNOSIS — G8929 Other chronic pain: Secondary | ICD-10-CM

## 2014-09-08 MED ORDER — PREGABALIN 25 MG PO CAPS: 25.0000 mg | ORAL_CAPSULE | Freq: Two times a day (BID) | ORAL | Status: DC

## 2014-09-08 MED ORDER — PANTOPRAZOLE SODIUM 20 MG PO TBEC: 20.0000 mg | DELAYED_RELEASE_TABLET | Freq: Every day | ORAL | Status: DC

## 2014-09-08 NOTE — Progress Notes (Signed)
Pre visit review using our clinic review tool, if applicable. No additional management support is needed unless otherwise documented below in the visit note. 

## 2014-09-08 NOTE — Patient Instructions (Addendum)
We will have you try a lower strength of the lyrica on days that you work to see if this can help some with the pain but not make you too drowsy.  We have sent in the medicine for acid reflux so you can take it when you need it for acid reflux.   You are still in good control of the diabetes and doing a good job by trying to exercise 3 times per week.  We will make sure that you get the CT of the lung this summer (around June).

## 2014-09-09 DIAGNOSIS — K219 Gastro-esophageal reflux disease without esophagitis: Secondary | ICD-10-CM | POA: Insufficient documentation

## 2014-09-09 NOTE — Assessment & Plan Note (Signed)
Still taking cymbalta and lyrica. Will try lyrica 25 mg on days when she works and use her full dose when she is not working.

## 2014-09-09 NOTE — Assessment & Plan Note (Signed)
She does have some neuropathy. Reviewed recent HgA1c drawn by health nurse which was 6.9. Slightly higher than prior but still at goal. She is not able to exercise much but does think that controlling her diabetes is important.

## 2014-09-09 NOTE — Assessment & Plan Note (Signed)
Last checked June 2015, without growth. Recheck in June 2016.

## 2014-09-09 NOTE — Progress Notes (Signed)
   Subjective:    Patient ID: Bridget [name], female    DOB: 30-Apr-1961, 54 y.o.   MRN: [id number]  HPI The patient is a 54 YO woman who is coming in today to follow up with a few changes the health nurse has recommended. She wants a GERD medicine that she can take prn since she does not have one. She also wants to try a very low dose of lyrica. 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 She does have occasional GERD which is bothering her more lately. She is usually just able to ignore it.   Review of Systems  Constitutional: Negative for fever, activity change, appetite change and fatigue.  HENT: Negative.   Eyes: Negative.   Respiratory: Negative for cough, chest tightness, shortness of breath and wheezing.   Cardiovascular: Negative for chest pain, palpitations and leg swelling.  Gastrointestinal: Negative for nausea, diarrhea and constipation.  Genitourinary: Negative for dysuria, frequency, flank pain, vaginal discharge and vaginal pain.  Musculoskeletal: Positive for myalgias and arthralgias. Negative for gait problem.  Skin: Negative.   Neurological: Negative for dizziness, weakness and light-headedness.      Objective:   Physical Exam  Constitutional: She is oriented to person, place, and time. She appears well-developed and well-nourished.  Obese  HENT:  Head: Normocephalic and atraumatic.  Eyes: EOM are normal.  Neck: Normal range of motion.  Cardiovascular: Normal rate and regular rhythm.   Pulmonary/Chest: Effort normal and breath sounds normal. No respiratory distress. She has no wheezes. She has no rales.  Abdominal: Soft. Bowel sounds are normal. She exhibits no distension. There is no rebound and no guarding.  Neurological: She is alert and oriented to person, place, and time. Coordination normal.    Skin: Skin is warm and dry.   Filed Vitals:   09/08/14 0827  BP: 110/62  Pulse: 81  Temp: 98.4 F (36.9 C)  TempSrc: Oral  Resp: 12  Height: 5' 5.5" (1.664 m)  Weight: 238 lb (107.956 kg)  SpO2: 97%      Assessment & Plan:

## 2014-09-09 NOTE — Assessment & Plan Note (Signed)
New problem that we are talking about today. She does have symptoms several times per week and would like a medication she can use. Talked about other strategies as well including not eating and then lying down, certain foods.

## 2014-09-10 ENCOUNTER — Telehealth: Payer: Self-pay | Admitting: Internal Medicine

## 2014-09-10 NOTE — Telephone Encounter (Signed)
Spoke to patient and informed her to come pick her letter up an if this letter was not what she needed then she would need to schedule an office visit.

## 2014-09-10 NOTE — Telephone Encounter (Signed)
Have printed letter based on clinical information. If not adequate needs visit.

## 2014-09-10 NOTE — Telephone Encounter (Signed)
Does she need an ov, or would you want to print a letter for her?

## 2014-09-10 NOTE — Telephone Encounter (Signed)
Patient was at work last night and became highly nauseated with shaking and nervousness. She felt as though she might have a nervous breakdown. She called out of work for [employer], and her [employer] management company is advising that she will need a MD note. Advised that she will most likely have to come back in to be seen based on the notes from the 09/08/2014 visit.

## 2014-09-14 NOTE — Progress Notes (Signed)
Patient was seen on 09/06/14 for the complete diabetes self-management series at the [facility]. This is a part of the [facility] [employer].  Handouts given during class include:  Living Well with Diabetes book  Carb Counting and Meal Planning book  Meal Plan Card  Carbohydrate guide  Meal planning worksheet  Low Sodium Flavoring Tips  The diabetes portion plate  Low Carbohydrate Snack Suggestions  A1c to eAG Conversion Chart  Diabetes Medications  Stress Management  Diabetes Recommended Care Schedule  Diabetes Success Plan  Core Class Satisfaction Survey  The following learning objectives were met by the patient during this course:  Describe diabetes  State some common risk factors for diabetes  Defines the role of glucose and insulin  Identifies type of diabetes and pathophysiology  Describe the relationship between diabetes and cardiovascular risk  State the members of the Healthcare Team  States the rationale for glucose monitoring  State when to test glucose  State their individual Target Range  State the importance of logging glucose readings  Describe how to interpret glucose readings  Identifies A1C target  Explain the correlation between A1c and eAG values  State symptoms and treatment of high blood glucose  State symptoms and treatment of low blood glucose  Explain proper technique for glucose testing  Identifies proper sharps disposal  Describe the role of different macronutrients on glucose  Explain how carbohydrates affect blood glucose  State what foods contain the most carbohydrates  Demonstrate carbohydrate counting  Demonstrate how to read Nutrition Facts food label  Describe effects of various fats on heart health  Describe the importance of good nutrition for health and healthy eating strategies  Describe techniques for managing your shopping, cooking and meal planning  List  strategies to follow meal plan when dining out  Describe the effects of alcohol on glucose and how to use it safely . State the amount of activity recommended for healthy living . Describe activities suitable for individual needs . Identify ways to regularly incorporate activity into daily life . Identify barriers to activity and ways to over come these barriers  Identify diabetes medications being personally used and their primary action for lowering glucose and possible side effects . Describe role of stress on blood glucose and develop strategies to address psychosocial issues . Identify diabetes complications and ways to prevent them  Explain how to manage diabetes during illness . Evaluate success in meeting personal goal . Establish 2-3 goals that they will plan to diligently work on until they return for the  44-monthfollow-up visit  Goals:   I will count my carb choices at most meals and snacks  I will be active 2 times a week  I will test my glucose at least 1 days a week  To help manage stress I will continue to go to the beach  Your patient has identified these potential barriers to change:  Stress  Your patient has identified their diabetes self-care support plan as  Family Support   Plan: Follow up with [employer] [facility] [email or web address]

## 2014-10-17 DIAGNOSIS — Z0279 Encounter for issue of other medical certificate: Secondary | ICD-10-CM

## 2014-10-31 ENCOUNTER — Ambulatory Visit (INDEPENDENT_AMBULATORY_CARE_PROVIDER_SITE_OTHER): Payer: 59 | Admitting: Internal Medicine

## 2014-10-31 ENCOUNTER — Encounter: Payer: Self-pay | Admitting: Internal Medicine

## 2014-10-31 VITALS — BP 110/76 | HR 86 | Temp 98.9°F | Resp 14 | Ht 65.5 in | Wt 228.0 lb

## 2014-10-31 DIAGNOSIS — M329 Systemic lupus erythematosus, unspecified: Secondary | ICD-10-CM

## 2014-10-31 MED ORDER — PREGABALIN 150 MG PO CAPS: 150.0000 mg | ORAL_CAPSULE | Freq: Two times a day (BID) | ORAL | Status: DC

## 2014-10-31 MED ORDER — PREGABALIN 25 MG PO CAPS: 25.0000 mg | ORAL_CAPSULE | Freq: Two times a day (BID) | ORAL | Status: DC

## 2014-10-31 NOTE — Assessment & Plan Note (Signed)
Doing well with the lyrica 150 mg when able to take it. She has not been able to try 25 mg lyrica before work as the pharmacy will not fill. Will print off both today and sign that she needs both for 1 month supply. FMLA papers filled out and signed today. Faxed in and copy given to patient.

## 2014-10-31 NOTE — Patient Instructions (Signed)
We have faxed the FMLA and given you a copy if you need it. We have also refilled the lyrica and written that you need both for 1 month supply.

## 2014-10-31 NOTE — Progress Notes (Signed)
   Subjective:    Patient ID: Bridget [name], female    DOB: 1961-05-19, 54 y.o.   MRN: [id number]  HPI The patient is a 54 YO female coming in for follow up of her pain. She is still getting relief from lyrica but it makes her too drowsy to work. She was unable to fill the lower dose lyrica as her pharmacy stated she just filled the other lyrica rx. She has hurt her knee since and has needed extra flexeril for pain relief. She is doing a little better with that now. Still struggling to balance pain and alertness. Pain 8/10 without medication and down to 2/10 with lyrica.   Review of Systems  Constitutional: Negative for fever, activity change, appetite change and fatigue.  HENT: Negative.   Eyes: Negative.   Respiratory: Negative for cough, chest tightness, shortness of breath and wheezing.   Cardiovascular: Negative for chest pain, palpitations and leg swelling.  Gastrointestinal: Negative for nausea, diarrhea and constipation.  Genitourinary: Negative for dysuria, frequency, flank pain, vaginal discharge and vaginal pain.  Musculoskeletal: Positive for myalgias and arthralgias. Negative for gait problem.  Skin: Negative.   Neurological: Negative for dizziness, weakness and light-headedness.      Objective:   Physical Exam  Constitutional: She is oriented to person, place, and time. She appears well-developed and well-nourished.  Obese  HENT:  Head: Normocephalic and atraumatic.  Eyes: EOM are normal.  Neck: Normal range of motion.  Cardiovascular: Normal rate and regular rhythm.   Pulmonary/Chest: Effort normal and breath sounds normal. No respiratory distress. She has no wheezes. She has no rales.  Abdominal: Soft. Bowel sounds are normal. She exhibits no distension. There is no rebound and no guarding.  Neurological: She is alert and oriented to person, place, and time. Coordination normal.  Skin: Skin is warm and dry.   Filed Vitals:   10/31/14 1023  BP: 110/76  Pulse: 86    Temp: 98.9 F (37.2 C)  TempSrc: Oral  Resp: 14  Height: 5' 5.5" (1.664 m)  Weight: 228 lb (103.42 kg)  SpO2: 97%      Assessment & Plan:

## 2014-10-31 NOTE — Progress Notes (Signed)
Pre visit review using our clinic review tool, if applicable. No additional management support is needed unless otherwise documented below in the visit note. 

## 2014-11-04 ENCOUNTER — Ambulatory Visit (INDEPENDENT_AMBULATORY_CARE_PROVIDER_SITE_OTHER): Payer: Self-pay | Admitting: Family Medicine

## 2014-11-04 VITALS — BP 122/82 | Ht 65.0 in | Wt 229.0 lb

## 2014-11-04 DIAGNOSIS — E118 Type 2 diabetes mellitus with unspecified complications: Secondary | ICD-10-CM

## 2014-11-04 NOTE — Progress Notes (Signed)
Patient presents today for annual pharmacy consult as part of the employer-sponsored Link to Wellness program. Current diabetes regimen includes Januvia, glimepiride, and Invokana. Patient also continues on daily ASA, ACEi, and statin. We have updated and reconciled all med lists. Most recent MD follow-up was March 2016. Patient has a pending appt for May 2016. Of note, patient has had a difficult time with pain control recently. She suffers considerable joint and neuropathic pain including burning, tingling, stabbing sensations in lower legs and feet, arms and hands, and knee joints. In the past she has been treated for lupus, but this has since been ruled out. She has also seen a rheumatologist in the past. Most currently she is being treated by PCP for fibromyalgia. Lyrica dose is being adjusted and patient and provider are trying to find a right balance between pain control and level of sedation. She will have new prescriptions filled today.  Diabetes Assessment: Type of Diabetes: Type 2; Sees Diabetes provider 3 times per year; checks feet daily; uses glucometer; hypoglycemia frequency 0; Current Diabetes related medical conditions are High blood pressure, High cholesterol; What is target A1c? <7.0 %; MD managing Diabetes Genella MechElizabeth Kollar; Year of diagnosis 08/14/2007; takes an aspirin a day; Other Diabetes History: Patient did not bring meter today. We spent considerable time on medications and lifestyle, which left us little time to discuss self-monitoring and glucose readings. We did not do an A1c at this visit given that this will be repeated by her [facility] next month. Also, of note, patient has not had atorvastatin filled in approximately 18 months.  She is unsure if she is still taking this or not, but will check when she gets home and will notify me if I need to request a new prescription for the patient.    Lifestyle Factors: Diet - has attended diabetes nutrition class, as  a result has reduced amount of fried foods and practices better portion control. She is pleased with recent weight loss. Marylu LundJanet will continue to assess and set goals as necessary. Exercise - No exercise at this time. [facility], using bike and treadmill. She discontinued this in early December due to uncontrolled pain. Over the past three months patient and her PCP have been working to better manage pain so pt can function normally. Also in the past patient has participated in water aerobics with a friend of hers who has a Research scientist (physical sciences)membership [facility] [employer]. Patient reports that water aerobics has been the best exercise for her pain, it improved joint pain, was relaxing, and improved her sleep habits. I have encouraged her to resume water aerobics.   Plan: 1) Attempt to resume mild exercise, specifically water aerobics 2) Attempt to make healthy dietary choices, continue to monitor portion sizes and make healthy choices 3) Follow-up with Marylu LundJanet on 3/10 and again in early April

## 2014-11-07 ENCOUNTER — Other Ambulatory Visit: Payer: Self-pay | Admitting: Geriatric Medicine

## 2014-11-07 MED ORDER — LISINOPRIL 5 MG PO TABS: 5.0000 mg | ORAL_TABLET | Freq: Every day | ORAL | Status: DC

## 2014-12-02 NOTE — Progress Notes (Signed)
ATTENDING PHYSICIAN NOTE: I have reviewed the chart and agree with the plan as detailed above. Emelio Schneller MD Pager [phone]  

## 2014-12-09 ENCOUNTER — Ambulatory Visit: Payer: 59 | Admitting: *Deleted

## 2014-12-15 ENCOUNTER — Other Ambulatory Visit: Payer: Self-pay | Admitting: *Deleted

## 2014-12-15 ENCOUNTER — Encounter: Payer: Self-pay | Admitting: *Deleted

## 2014-12-15 VITALS — BP 110/80 | HR 80 | Ht 65.0 in | Wt 235.8 lb

## 2014-12-15 DIAGNOSIS — E1165 Type 2 diabetes mellitus with hyperglycemia: Secondary | ICD-10-CM

## 2014-12-15 NOTE — Patient Outreach (Signed)
[facility]) Care Management   12/15/2014  Bridget [name] Jan 20, 1961 [id number]  Bridget [name] is an 54 y.o. female who presents with her adult daughter Alvino Chapel and her 50 year old grandson Melanee Spry for routine Link To Wellness follow up.  Subjective:  Bridget [name] says her long and erratic work hours is the main reason she does not consistently take her medications. She also says she has not been adhering to a CHO controlled meal plan again because her work hours cause her to eat erratically. Filed Weights   12/15/14 1404  Weight: 235 lb 12.8 oz (106.958 kg)   Filed Vitals:   12/15/14 1404  BP: 110/80  Pulse: 80  Pulse oximetry on room air is 98%  Objective:   Review of Systems  Constitutional: Negative.     Physical Exam  Constitutional: She is oriented to person, place, and time. She appears well-developed and well-nourished.  Neurological: She is alert and oriented to person, place, and time.  Skin:     Psychiatric: She has a normal mood and affect. Her behavior is normal. Judgment and thought content normal.    Current Medications:   Current Outpatient Prescriptions  Medication Sig Dispense Refill  . Alcohol Swabs (ALCOHOL PREPS) PADS 1 Device by Does not apply route 4 (four) times daily -  before meals and at bedtime. 100 each 11  . amitriptyline (ELAVIL) 25 MG tablet Take 0.5 tablets (12.5 mg total) by mouth at bedtime. 30 tablet 2  . aspirin 81 MG tablet Take 1 tablet (81 mg total) by mouth daily.    . Cranberry-Vitamin C-Probiotic (AZO CRANBERRY PO) Take by mouth daily.    . DULoxetine (CYMBALTA) 60 MG capsule Take 1 capsule (60 mg total) by mouth daily. 30 capsule 3  . glimepiride (AMARYL) 4 MG tablet TAKE 1 TABLET (4 MG TOTAL) BY MOUTH DAILY WITH BREAKFAST. 90 tablet 3  . INVOKANA 300 MG TABS TAKE 1 TABLET BY MOUTH DAILY. 90 tablet 3  . lisinopril (PRINIVIL,ZESTRIL) 5 MG tablet Take 1 tablet (5 mg total) by mouth daily. 30 tablet 5  . pantoprazole  (PROTONIX) 20 MG tablet Take 1 tablet (20 mg total) by mouth daily. 30 tablet 6  . pregabalin (LYRICA) 150 MG capsule Take 1 capsule (150 mg total) by mouth 2 (two) times daily. 60 capsule 2  . sitaGLIPtin (JANUVIA) 100 MG tablet Take 1 tablet (100 mg total) by mouth daily. 90 tablet 3  . triamcinolone (NASACORT AQ) 55 MCG/ACT AERO nasal inhaler Place 2 sprays into the nose daily. 1 Inhaler 12  . TRUETEST TEST test strip USE AS INSTRUCTED 100 each 11  . atorvastatin (LIPITOR) 40 MG tablet Take 1 tablet (40 mg total) by mouth daily. (Patient not taking: Reported on 12/15/2014) 90 tablet 3  . cetirizine (ZYRTEC) 10 MG tablet Take 1 tablet (10 mg total) by mouth daily. 30 tablet 11  . cyclobenzaprine (FLEXERIL) 5 MG tablet Take 1 tablet (5 mg total) by mouth 3 (three) times daily as needed for muscle spasms. 30 tablet 1  . ondansetron (ZOFRAN ODT) 8 MG disintegrating tablet Take 1 tablet (8 mg total) by mouth every 8 (eight) hours as needed for nausea or vomiting. (Patient not taking: Reported on 12/15/2014) 20 tablet 0  . pregabalin (LYRICA) 25 MG capsule Take 1 capsule (25 mg total) by mouth 2 (two) times daily. (Patient not taking: Reported on 12/15/2014) 60 capsule 2   No current facility-administered medications for this visit.  Functional Status:   In your present state of health, do you have any difficulty performing the following activities: 12/15/2014  Hearing? N  Vision? N  Difficulty concentrating or making decisions? N  Walking or climbing stairs? Y  Dressing or bathing? N  Doing errands, shopping? N    Fall/Depression Screening:    PHQ 2/9 Scores 12/15/2014 09/14/2014 09/27/2013 08/07/2013 12/08/2011 11/09/2011  PHQ - 2 Score 1 0 0 0 6 6  PHQ- 9 Score - - - - 6 22   [facility] CM Care Plan        Patient Outreach from 12/15/2014 in [facility] Link To Wellness   Care Plan Problem One  Type II DM not meeting A1C target   Interventions for Problem One Long Term Goal  reviewed basic  glucose metabolic defects in Type II DM, reviewed medications and discussed strategies to improve medication adherence such as  taking her medications with the meal she consistently eats, reviewed importance of exercise and its effect on insulin resistance, reviewed upcoming appointments with Dr. Dorise HissKollar and reinforcement of  the importance of keeping the appointment   [facility] Long Term Goal (31-90 days)  Improved glycemic control as evidenced by A1C<8.0% at next 3 month check   [facility] Long Term Goal Start Date  12/15/14      Assessment:   Type II DM not meeting A1C target, with significant increase in A1C from 6.9% to 8.7% in 4 months, struggling to adhere to diet and exercise due to work hours and work schedule.  Plan:   RNCM to fax today's office visit note to Dr. Dorise HissKollar. RNCM will meet monthly and as needed with patient per Link To Wellness program guidelines to assist with Type II DM self-management and assess patient's progress toward mutually set goals.   Bary RichardJanet S. Yanna Leaks RN,CCM,CDE [facility] Care Management Coordinator Office Phone [phone] Office Fax [phone]

## 2014-12-16 LAB — POCT GLYCOSYLATED HEMOGLOBIN (HGB A1C): Hemoglobin A1C: 8.7

## 2014-12-16 LAB — POCT CBG (FASTING - GLUCOSE)-MANUAL ENTRY: GLUCOSE FASTING, POC: 167 mg/dL — AB (ref 70–99)

## 2014-12-23 ENCOUNTER — Ambulatory Visit (INDEPENDENT_AMBULATORY_CARE_PROVIDER_SITE_OTHER): Payer: 59 | Admitting: Internal Medicine

## 2014-12-23 ENCOUNTER — Ambulatory Visit: Payer: 59 | Admitting: Internal Medicine

## 2014-12-23 ENCOUNTER — Encounter: Payer: Self-pay | Admitting: Internal Medicine

## 2014-12-23 VITALS — BP 132/82 | HR 78 | Temp 98.2°F | Resp 12 | Ht 65.0 in | Wt 233.0 lb

## 2014-12-23 DIAGNOSIS — E118 Type 2 diabetes mellitus with unspecified complications: Secondary | ICD-10-CM | POA: Diagnosis not present

## 2014-12-23 DIAGNOSIS — Z0289 Encounter for other administrative examinations: Secondary | ICD-10-CM

## 2014-12-23 NOTE — Assessment & Plan Note (Signed)
She is in severe exacerbation and is now not well controlled as she is not taking her medicines well. Agree to fill out her FMLA to limit working hours per week to 45 and okay with 6 days off per month. Talked to her about her worsening health that is likely directly related to her poor control of her sugars. Reminded her how important it is to keep herself in good health. She will really work on it. She has been asked to add exercise from her wellness coach but she is not able to do that now. She will also work on diet as this is likely also hurting her level of control as well as not taking her medicines. She will work hard to take her medicines 75% of the time until next visit.

## 2014-12-23 NOTE — Progress Notes (Signed)
Pre visit review using our clinic review tool, if applicable. No additional management support is needed unless otherwise documented below in the visit note. 

## 2014-12-23 NOTE — Progress Notes (Signed)
   Subjective:    Patient ID: Bridget [name], female    DOB: Jun 29, 1961, 54 y.o.   MRN: [id number]  HPI The patient is a 54 YO female who is here because her diabetes is newly out of control. She has had deterioration of her HgA1c to 8.7 from 6.9 last time. She has not been taking her medication regularly due to her work hours. She is also not eating well due to stress at work. She remembers to take her medicines maybe 3 times per week. She does much better when she has several days off work to rest per week and does not feel she is able to do a good job at work when she is working more than 45 hours per week. Lately they have been short staffed and she is having to work about 60 hours per week. She has been feeling more tired and having more pains. She is not sure if this is due to the sugars being up or her working more hours.   Review of Systems  Constitutional: Positive for fatigue. Negative for fever, activity change and appetite change.  HENT: Negative.   Eyes: Negative.   Respiratory: Negative for cough, chest tightness, shortness of breath and wheezing.   Cardiovascular: Negative for chest pain, palpitations and leg swelling.  Gastrointestinal: Negative for nausea, diarrhea and constipation.  Genitourinary: Negative for dysuria, frequency, flank pain, vaginal discharge and vaginal pain.  Musculoskeletal: Positive for myalgias and arthralgias. Negative for gait problem.  Skin: Negative.   Neurological: Negative for dizziness, weakness and light-headedness.  Psychiatric/Behavioral: Positive for sleep disturbance.      Objective:   Physical Exam  Constitutional: She is oriented to person, place, and time. She appears well-developed and well-nourished.  Obese  HENT:  Head: Normocephalic and atraumatic.  Eyes: EOM are normal.  Neck: Normal range of motion.  Cardiovascular: Normal rate and regular rhythm.   Pulmonary/Chest: Effort normal and breath sounds normal. No respiratory  distress. She has no wheezes. She has no rales.  Abdominal: Soft. Bowel sounds are normal. She exhibits no distension. There is no rebound and no guarding.  Neurological: She is alert and oriented to person, place, and time. Coordination normal.  Skin: Skin is warm and dry.   Filed Vitals:   12/23/14 1013  BP: 132/82  Pulse: 78  Temp: 98.2 F (36.8 C)  TempSrc: Oral  Resp: 12  Height: 5\' 5"  (1.651 m)  Weight: 233 lb (105.688 kg)  SpO2: 95%      Assessment & Plan:

## 2014-12-23 NOTE — Patient Instructions (Signed)
We will change the FMLA papers and have you fax them today.   Work on doing better with taking the medicines since we know that they work.

## 2014-12-30 ENCOUNTER — Telehealth: Payer: Self-pay | Admitting: Internal Medicine

## 2014-12-30 NOTE — Telephone Encounter (Signed)
Patient called regarding her FMLA certifications have been faxed yet.

## 2014-12-31 ENCOUNTER — Ambulatory Visit: Payer: 59 | Admitting: Internal Medicine

## 2015-01-07 ENCOUNTER — Emergency Department (INDEPENDENT_AMBULATORY_CARE_PROVIDER_SITE_OTHER): Payer: 59

## 2015-01-07 ENCOUNTER — Emergency Department (HOSPITAL_COMMUNITY)
Admission: EM | Admit: 2015-01-07 | Discharge: 2015-01-07 | Disposition: A | Discharge status: Home / Self Care | Payer: 59 | Source: Home / Self Care | Attending: Family Medicine | Admitting: Family Medicine

## 2015-01-07 ENCOUNTER — Encounter (HOSPITAL_COMMUNITY): Payer: Self-pay | Admitting: Emergency Medicine

## 2015-01-07 DIAGNOSIS — S93509A Unspecified sprain of unspecified toe(s), initial encounter: Secondary | ICD-10-CM

## 2015-01-07 DIAGNOSIS — E1140 Type 2 diabetes mellitus with diabetic neuropathy, unspecified: Secondary | ICD-10-CM

## 2015-01-07 MED ORDER — KETOROLAC TROMETHAMINE 60 MG/2ML IM SOLN
60.0000 mg | Freq: Once | INTRAMUSCULAR | Status: AC
  Administered 2015-01-07: 60 mg via INTRAMUSCULAR

## 2015-01-07 MED ORDER — KETOROLAC TROMETHAMINE 60 MG/2ML IM SOLN
INTRAMUSCULAR | Status: AC
  Filled 2015-01-07: qty 2

## 2015-01-07 NOTE — Discharge Instructions (Signed)
He fortunately only sprained her toe. There is no evidence of fracture today. However if her pain persists for more than 2 weeks please return for another set of x-rays as there are some very small fractures which can take 2 weeks to show up. Please use buddy tape, and wear the postop shoe as you feel necessary to help with the healing process and for comfort. Your given a shot of Toradol to help with the pain and inflammation. You may resume taking Advil in 24 hours.

## 2015-01-07 NOTE — ED Notes (Signed)
C/o right 2nd toe inj onset yest Reports she jammed it against bathtub while getting out Sx include swelling and painful Steady gait; no signs of acute distress.

## 2015-01-07 NOTE — ED Provider Notes (Signed)
CSN: [id number]     Arrival date & time 01/07/15  0804 History   First MD Initiated Contact with Patient 01/07/15 [phone]     Chief Complaint  Patient presents with  . Toe Injury   (Consider location/radiation/quality/duration/timing/severity/associated sxs/prior Treatment) HPI  Injured right second toe. Patient was stepping out of the bathtub last night and struck her toe on the bathtub. Toe was immediately painful. Has continued to swell. Symptoms are constant and getting worse. Difficult to ambulate due to pain. Patient has not taken anything for the pain due to her chronic comorbidities and extensive medication list. Denies fevers, chest pain, shortness of breath, palpitations.  Past Medical History  Diagnosis Date  . Diabetes mellitus without complication   . Osteoarthritis   . GERD (gastroesophageal reflux disease)   . Diverticulosis   . Glaucoma   . Obesity   . Vaginal yeast infection 12/08/2010  . SHOULDER PAIN, LEFT 05/19/2010    Qualifier: Diagnosis of  By: Clotilde DieterStrother MD, Amber    . Retained tampon 05/21/2012  . OSTEOARTHRITIS, KNEE 04/27/2010  . OBESITY, NOS 10/26/2006  . MALAISE AND FATIGUE 05/27/2010  . HEEL SPUR 09/10/2007    Qualifier: Diagnosis of  By: Lanier PrudeBolden  MD, Cathrine Musteraineisha    . DIVERTICULOSIS, COLON 10/26/2006  . Bacterial vaginosis 12/07/2010   Past Surgical History  Procedure Laterality Date  . Abdominal hysterectomy  1990  . Bladder surgery  2001  . Cataract extraction Right 2001    with implant   Family History  Problem Relation Age of Onset  . Breast cancer Mother   . Ovarian cancer Maternal Grandmother   . Stomach cancer Cousin   . Diabetes Mother   . Heart disease Maternal Grandmother     great  . Diabetes Brother     x 3   History  Substance Use Topics  . Smoking status: Former Smoker -- 0.30 packs/day    Types: Cigarettes    Quit date: 08/29/1998  . Smokeless tobacco: Never Used     Comment: Quit in 2000   . Alcohol Use: Yes     Comment: 1 per  week   OB History    No data available     Review of Systems Per HPI with all other pertinent systems negative.   Allergies  Citrus; Codeine; Metformin and related; Metoclopramide hcl; Penicillins; Propoxyphene n-acetaminophen; and Sulfonamide derivatives  Home Medications   Prior to Admission medications   Medication Sig Start Date End Date Taking? Authorizing Provider  amitriptyline (ELAVIL) 25 MG tablet Take 0.5 tablets (12.5 mg total) by mouth at bedtime. 05/21/14  Yes Judie BonusElizabeth A Kollar, MD  aspirin 81 MG tablet Take 1 tablet (81 mg total) by mouth daily. 09/27/13  Yes Andrena MewsMichael D Rigby, DO  atorvastatin (LIPITOR) 40 MG tablet Take 1 tablet (40 mg total) by mouth daily. 09/27/13  Yes Andrena MewsMichael D Rigby, DO  cetirizine (ZYRTEC) 10 MG tablet Take 1 tablet (10 mg total) by mouth daily. 12/24/12  Yes Ivy de Lawson RadarLa Cruz, DO  Cranberry-Vitamin C-Probiotic (AZO CRANBERRY PO) Take by mouth daily.   Yes Historical Provider, MD  DULoxetine (CYMBALTA) 60 MG capsule Take 1 capsule (60 mg total) by mouth daily. 05/21/14  Yes Judie BonusElizabeth A Kollar, MD  glimepiride (AMARYL) 4 MG tablet TAKE 1 TABLET (4 MG TOTAL) BY MOUTH DAILY WITH BREAKFAST. 06/23/14  Yes Judie BonusElizabeth A Kollar, MD  INVOKANA 300 MG TABS TAKE 1 TABLET BY MOUTH DAILY. 06/23/14  Yes Judie BonusElizabeth A Kollar, MD  lisinopril (PRINIVIL,ZESTRIL)  5 MG tablet Take 1 tablet (5 mg total) by mouth daily. 11/07/14  Yes Judie BonusElizabeth A Kollar, MD  pantoprazole (PROTONIX) 20 MG tablet Take 1 tablet (20 mg total) by mouth daily. 09/08/14  Yes Judie BonusElizabeth A Kollar, MD  pregabalin (LYRICA) 150 MG capsule Take 1 capsule (150 mg total) by mouth 2 (two) times daily. 10/31/14  Yes Judie BonusElizabeth A Kollar, MD  sitaGLIPtin (JANUVIA) 100 MG tablet Take 1 tablet (100 mg total) by mouth daily. 06/23/14  Yes Judie BonusElizabeth A Kollar, MD  triamcinolone (NASACORT AQ) 55 MCG/ACT AERO nasal inhaler Place 2 sprays into the nose daily. 09/27/13  Yes Andrena MewsMichael D Rigby, DO  Alcohol Swabs (ALCOHOL PREPS) PADS 1  Device by Does not apply route 4 (four) times daily -  before meals and at bedtime. 05/15/13   Andrena MewsMichael D Rigby, DO  cyclobenzaprine (FLEXERIL) 5 MG tablet Take 1 tablet (5 mg total) by mouth 3 (three) times daily as needed for muscle spasms. 05/21/14   Judie BonusElizabeth A Kollar, MD  ondansetron (ZOFRAN ODT) 8 MG disintegrating tablet Take 1 tablet (8 mg total) by mouth every 8 (eight) hours as needed for nausea or vomiting. 08/13/13   Marisa Severinlga Otter, MD  pregabalin (LYRICA) 25 MG capsule Take 1 capsule (25 mg total) by mouth 2 (two) times daily. 10/31/14   Judie BonusElizabeth A Kollar, MD  TRUETEST TEST test strip USE AS INSTRUCTED 08/20/13   Andrena MewsMichael D Rigby, DO   BP 142/82 mmHg  Pulse 73  Temp(Src) 98.2 F (36.8 C) (Oral)  Resp 16  SpO2 97% Physical Exam Physical Exam  Constitutional: oriented to person, place, and time. appears well-developed and well-nourished. No distress.  HENT:  Head: Normocephalic and atraumatic.  Eyes: EOMI. PERRL.  Neck: Normal range of motion.  Cardiovascular: RRR, no m/r/g, 2+ distal pulses,  Pulmonary/Chest: Effort normal and breath sounds normal. No respiratory distress.  Abdominal: Soft. Bowel sounds are normal. NonTTP, no distension.  Musculoskeletal: Right second toe swollen and erythematous, tender to palpation. Less than 2 second cap refill. Sensation intact.  Neurological: alert and oriented to person, place, and time.  Skin: Skin is warm. No rash noted. non diaphoretic.  Psychiatric: normal mood and affect. behavior is normal. Judgment and thought content normal.    ED Course  Procedures (including critical care time) Labs Review Labs Reviewed - No data to display  Imaging Review Dg Foot Complete Right  01/07/2015   CLINICAL DATA:  Patient hit second toe on side of bathtub  EXAM: RIGHT FOOT COMPLETE - 3+ VIEW  COMPARISON:  March 11, 2011  FINDINGS: Frontal, oblique, and lateral views obtained. There is no appreciable fracture or dislocation. There is slight osteoarthritic  change in the first MTP joint, stable. Other joint spaces appear normal. No erosive change. There is an inferior calcaneal spur.  IMPRESSION: No fracture or dislocation. Stable mild osteoarthritic change in the first MTP joint. Inferior calcaneal spur present.   Electronically Signed   By: Bretta BangWilliam  Woodruff III M.D.   On: 01/07/2015 08:58     MDM   1. Toe sprain, initial encounter   2. Type 2 diabetes mellitus with diabetic neuropathy    Right second toe sprain. No evidence of fracture on the plain film. Toradol 60 mg given for pain and inflammation. Labs reviewed and creatinine normal. Patient tolerates NSAIDs very well.  Patient's last A1c 8.5. Reports difficulty with this management which is caused peripheral neuropathy was why patient is on Lyrica. Anticipate this complicated patient's healing process to some  degree. The 24 hours patient to resume use of Advil and will increase her dose to 4-600 mg every 6 hours. Ice pack provided. Buddy tape and postop shoe applied and patient to use for relief as necessary. Patient to return in 2 weeks if not improving for further management.    Ozella Rocks, MD 01/07/15 [phone]

## 2015-01-17 ENCOUNTER — Ambulatory Visit (INDEPENDENT_AMBULATORY_CARE_PROVIDER_SITE_OTHER): Payer: 59 | Admitting: Family Medicine

## 2015-01-17 ENCOUNTER — Encounter: Payer: Self-pay | Admitting: Family Medicine

## 2015-01-17 VITALS — BP 110/80 | HR 79 | Temp 97.9°F | Ht 65.0 in | Wt 233.2 lb

## 2015-01-17 DIAGNOSIS — J0100 Acute maxillary sinusitis, unspecified: Secondary | ICD-10-CM

## 2015-01-17 DIAGNOSIS — J019 Acute sinusitis, unspecified: Secondary | ICD-10-CM | POA: Insufficient documentation

## 2015-01-17 MED ORDER — LEVOFLOXACIN 500 MG PO TABS: 500.0000 mg | ORAL_TABLET | Freq: Every day | ORAL | Status: DC

## 2015-01-17 NOTE — Progress Notes (Signed)
Subjective:    Patient ID: Bridget [name], female    DOB: [birth year], 54 y.o.   MRN: [id number]  HPI Here with cold and cough symptoms   Started last Friday   Sinus congestion ST and now headache Thinks she did have a fever at one time - felt warm and malaise   Tried mucinex-not working  She does take zyrtec   Dark yellow sinus cong and facial pressure (mostly below the eyes) and an overall headache   Patient Active Problem List   Diagnosis Date Noted  . GERD (gastroesophageal reflux disease) 09/09/2014  . Other and unspecified hyperlipidemia 05/24/2013  . Lung nodule seen on imaging study 09/21/2012  . Chronic pain of multiple sites - Knees, Ankles, Back 11/09/2011  . Lupus (systemic lupus erythematosus) 06/28/2011  . HYPOTHYROIDISM 04/27/2010  . Depression 04/27/2010  . OSTEOARTHRITIS, KNEE 04/27/2010  . Diabetes mellitus type 2, controlled, with complications 08/14/2007  . Obesity 10/26/2006  . HYPERTENSION, BENIGN SYSTEMIC 10/26/2006  . VENOUS INSUFFICIENCY, CHRONIC 10/26/2006   Past Medical History  Diagnosis Date  . Diabetes mellitus without complication   . Osteoarthritis   . GERD (gastroesophageal reflux disease)   . Diverticulosis   . Glaucoma   . Obesity   . Vaginal yeast infection 12/08/2010  . SHOULDER PAIN, LEFT 05/19/2010    Qualifier: Diagnosis of  By: Clotilde Dieter MD, Amber    . Retained tampon 05/21/2012  . OSTEOARTHRITIS, KNEE 04/27/2010  . OBESITY, NOS 10/26/2006  . MALAISE AND FATIGUE 05/27/2010  . HEEL SPUR 09/10/2007    Qualifier: Diagnosis of  By: Lanier Prude  MD, Cathrine Muster    . DIVERTICULOSIS, COLON 10/26/2006  . Bacterial vaginosis 12/07/2010   Past Surgical History  Procedure Laterality Date  . Abdominal hysterectomy  1990  . Bladder surgery  2001  . Cataract extraction Right 2001    with implant   History  Substance Use Topics  . Smoking status: Former Smoker -- 0.30 packs/day    Types: Cigarettes    Quit date: 08/29/1998  . Smokeless  tobacco: Never Used     Comment: Quit in 2000   . Alcohol Use: Yes     Comment: 1 per week   Family History  Problem Relation Age of Onset  . Breast cancer Mother   . Ovarian cancer Maternal Grandmother   . Stomach cancer Cousin   . Diabetes Mother   . Heart disease Maternal Grandmother     great  . Diabetes Brother     x 3   Allergies  Allergen Reactions  . Citrus Itching  . Codeine Hives  . Metformin And Related Diarrhea and Nausea And Vomiting  . Metoclopramide Hcl Nausea And Vomiting  . Penicillins Nausea And Vomiting  . Propoxyphene N-Acetaminophen Nausea And Vomiting  . Sulfonamide Derivatives Hives   Current Outpatient Prescriptions on File Prior to Visit  Medication Sig Dispense Refill  . Alcohol Swabs (ALCOHOL PREPS) PADS 1 Device by Does not apply route 4 (four) times daily -  before meals and at bedtime. 100 each 11  . amitriptyline (ELAVIL) 25 MG tablet Take 0.5 tablets (12.5 mg total) by mouth at bedtime. 30 tablet 2  . aspirin 81 MG tablet Take 1 tablet (81 mg total) by mouth daily.    Marland Kitchen atorvastatin (LIPITOR) 40 MG tablet Take 1 tablet (40 mg total) by mouth daily. 90 tablet 3  . cetirizine (ZYRTEC) 10 MG tablet Take 1 tablet (10 mg total) by mouth daily. 30 tablet 11  .  Cranberry-Vitamin C-Probiotic (AZO CRANBERRY PO) Take by mouth daily.    . cyclobenzaprine (FLEXERIL) 5 MG tablet Take 1 tablet (5 mg total) by mouth 3 (three) times daily as needed for muscle spasms. 30 tablet 1  . DULoxetine (CYMBALTA) 60 MG capsule Take 1 capsule (60 mg total) by mouth daily. 30 capsule 3  . glimepiride (AMARYL) 4 MG tablet TAKE 1 TABLET (4 MG TOTAL) BY MOUTH DAILY WITH BREAKFAST. 90 tablet 3  . INVOKANA 300 MG TABS TAKE 1 TABLET BY MOUTH DAILY. 90 tablet 3  . lisinopril (PRINIVIL,ZESTRIL) 5 MG tablet Take 1 tablet (5 mg total) by mouth daily. 30 tablet 5  . ondansetron (ZOFRAN ODT) 8 MG disintegrating tablet Take 1 tablet (8 mg total) by mouth every 8 (eight) hours as needed  for nausea or vomiting. 20 tablet 0  . pantoprazole (PROTONIX) 20 MG tablet Take 1 tablet (20 mg total) by mouth daily. 30 tablet 6  . pregabalin (LYRICA) 150 MG capsule Take 1 capsule (150 mg total) by mouth 2 (two) times daily. 60 capsule 2  . pregabalin (LYRICA) 25 MG capsule Take 1 capsule (25 mg total) by mouth 2 (two) times daily. 60 capsule 2  . sitaGLIPtin (JANUVIA) 100 MG tablet Take 1 tablet (100 mg total) by mouth daily. 90 tablet 3  . triamcinolone (NASACORT AQ) 55 MCG/ACT AERO nasal inhaler Place 2 sprays into the nose daily. 1 Inhaler 12  . TRUETEST TEST test strip USE AS INSTRUCTED 100 each 11   No current facility-administered medications on file prior to visit.        Review of Systems Review of Systems  Constitutional: Negative for fever, appetite change, and unexpected weight change. pos for fatigue/malaise ENT pos for cong/rhinorrhea and sins pain  Eyes: Negative for pain and visual disturbance.  Respiratory: Negative for wheeze  and shortness of breath.  pos for dry cough  Cardiovascular: Negative for cp or palpitations    Gastrointestinal: Negative for nausea, diarrhea and constipation.  Genitourinary: Negative for urgency and frequency.  Skin: Negative for pallor or rash   Neurological: Negative for weakness, light-headedness, numbness and headaches.  Hematological: Negative for adenopathy. Does not bruise/bleed easily.  Psychiatric/Behavioral: Negative for dysphoric mood. The patient is not nervous/anxious.         Objective:   Physical Exam  Constitutional: She appears well-developed and well-nourished. No distress.  obese and well appearing   HENT:  Head: Normocephalic and atraumatic.  Right Ear: External ear normal.  Left Ear: External ear normal.  Mouth/Throat: Oropharynx is clear and moist. No oropharyngeal exudate.  Nares are injected and congested  Bilateral maxillary sinus tenderness  Post nasal drip   Eyes: Conjunctivae and EOM are normal.  Pupils are equal, round, and reactive to light. Right eye exhibits no discharge. Left eye exhibits no discharge.  Neck: Normal range of motion. Neck supple.  Cardiovascular: Normal rate and regular rhythm.   Pulmonary/Chest: Effort normal and breath sounds normal. No respiratory distress. She has no wheezes. She has no rales.  No rhonchi Good air exch  Lymphadenopathy:    She has no cervical adenopathy.  Neurological: She is alert. No cranial nerve deficit.  Skin: Skin is warm and dry. No rash noted.  Psychiatric: She has a normal mood and affect.          Assessment & Plan:   Problem List Items Addressed This Visit    Acute sinusitis - Primary    S/p over a week of uri  symptoms in a diabetic  Cover with levaquin (pcn and sulfa all)  Disc poss of low glucose readings- will watch  Disc symptomatic care - see instructions on AVS  Update if not starting to improve in a week or if worsening        Relevant Medications   levofloxacin (LEVAQUIN) 500 MG tablet

## 2015-01-17 NOTE — Assessment & Plan Note (Signed)
S/p over a week of uri symptoms in a diabetic  Cover with levaquin (pcn and sulfa all)  Disc poss of low glucose readings- will watch  Disc symptomatic care - see instructions on AVS  Update if not starting to improve in a week or if worsening

## 2015-01-17 NOTE — Patient Instructions (Signed)
Take levaquin as directed for a sinus infection  Try nasal saline spray for congestion  Use mucinex if it helps Also warm compresses on your face  Watch for low blood sugars with this medications- please eat regular meals Update if not starting to improve in a week or if worsening

## 2015-01-17 NOTE — Progress Notes (Signed)
Pre visit review using our clinic review tool, if applicable. No additional management support is needed unless otherwise documented below in the visit note. 

## 2015-01-29 ENCOUNTER — Telehealth: Payer: Self-pay | Admitting: Internal Medicine

## 2015-01-29 NOTE — Telephone Encounter (Signed)
To clarify, this is what the patient is requesting.  A letter retroactive to 01/21/2015 to the date the letter is written (02/03/2015): she saw Dr. Milinda Antisower for a URI and was given an antibiotic. The antibiotic dropped her blood sugar, keeping her from taking her other meds. This created a relapse of her lupus. She has an appt on 02/16/2015, but needs a letter in order to return to work.   [employer] will be faxing a new certification because we are needing to put at least 6 days per month continuous dating from 12/22/2014 to the date of her return in June

## 2015-01-29 NOTE — Telephone Encounter (Signed)
Patient states she had to stay out of work longer than her FMLA has allowed b/c she was sick.  Employer is requesting a doctors note for this.  Patient has set an appointment up for the first available on the 20th to talk about FMLA.  Please follow up with patient next week.

## 2015-01-30 DIAGNOSIS — Z0279 Encounter for issue of other medical certificate: Secondary | ICD-10-CM

## 2015-01-30 NOTE — Telephone Encounter (Signed)
Received FMLA paperwork today. Partially completed, but I will need to confer with Dr. Dorise HissKollar in order to complete fully.

## 2015-02-01 ENCOUNTER — Encounter (HOSPITAL_COMMUNITY): Payer: Self-pay | Admitting: *Deleted

## 2015-02-01 ENCOUNTER — Emergency Department (INDEPENDENT_AMBULATORY_CARE_PROVIDER_SITE_OTHER): Payer: 59

## 2015-02-01 ENCOUNTER — Emergency Department (HOSPITAL_COMMUNITY)
Admission: EM | Admit: 2015-02-01 | Discharge: 2015-02-01 | Disposition: A | Discharge status: Home / Self Care | Payer: 59 | Source: Home / Self Care

## 2015-02-01 DIAGNOSIS — R0789 Other chest pain: Secondary | ICD-10-CM | POA: Diagnosis not present

## 2015-02-01 MED ORDER — TRAMADOL HCL 50 MG PO TABS: 50.0000 mg | ORAL_TABLET | Freq: Four times a day (QID) | ORAL | Status: DC | PRN

## 2015-02-01 NOTE — ED Provider Notes (Addendum)
CSN: [id number]     Arrival date & time 02/01/15  1835 History   None    Chief Complaint  Patient presents with  . Flank Pain   (Consider location/radiation/quality/duration/timing/severity/associated sxs/prior Treatment) Patient is a 54 y.o. female presenting with flank pain. The history is provided by the patient.  Flank Pain This is a new problem. The current episode started yesterday. The problem has not changed since onset.Associated symptoms include chest pain. Pertinent negatives include no abdominal pain and no shortness of breath.    Past Medical History  Diagnosis Date  . Diabetes mellitus without complication   . Osteoarthritis   . GERD (gastroesophageal reflux disease)   . Diverticulosis   . Glaucoma   . Obesity   . Vaginal yeast infection 12/08/2010  . SHOULDER PAIN, LEFT 05/19/2010    Qualifier: Diagnosis of  By: Clotilde Dieter MD, Amber    . Retained tampon 05/21/2012  . OSTEOARTHRITIS, KNEE 04/27/2010  . OBESITY, NOS 10/26/2006  . MALAISE AND FATIGUE 05/27/2010  . HEEL SPUR 09/10/2007    Qualifier: Diagnosis of  By: Lanier Prude  MD, Cathrine Muster    . DIVERTICULOSIS, COLON 10/26/2006  . Bacterial vaginosis 12/07/2010   Past Surgical History  Procedure Laterality Date  . Abdominal hysterectomy  1990  . Bladder surgery  2001  . Cataract extraction Right 2001    with implant   Family History  Problem Relation Age of Onset  . Breast cancer Mother   . Ovarian cancer Maternal Grandmother   . Stomach cancer Cousin   . Diabetes Mother   . Heart disease Maternal Grandmother     great  . Diabetes Brother     x 3   History  Substance Use Topics  . Smoking status: Former Smoker -- 0.30 packs/day    Types: Cigarettes    Quit date: 08/29/1998  . Smokeless tobacco: Never Used     Comment: Quit in 2000   . Alcohol Use: Yes     Comment: 1 per week   OB History    No data available     Review of Systems  Constitutional: Negative.   HENT: Negative.   Respiratory: Positive for  cough. Negative for shortness of breath and wheezing.   Cardiovascular: Positive for chest pain. Negative for palpitations and leg swelling.  Gastrointestinal: Negative for abdominal pain.  Genitourinary: Positive for flank pain.    Allergies  Citrus; Codeine; Metformin and related; Metoclopramide hcl; Penicillins; Propoxyphene n-acetaminophen; and Sulfonamide derivatives  Home Medications   Prior to Admission medications   Medication Sig Start Date End Date Taking? Authorizing Provider  Alcohol Swabs (ALCOHOL PREPS) PADS 1 Device by Does not apply route 4 (four) times daily -  before meals and at bedtime. 05/15/13   Andrena Mews, DO  amitriptyline (ELAVIL) 25 MG tablet Take 0.5 tablets (12.5 mg total) by mouth at bedtime. 05/21/14   Judie Bonus, MD  aspirin 81 MG tablet Take 1 tablet (81 mg total) by mouth daily. 09/27/13   Andrena Mews, DO  atorvastatin (LIPITOR) 40 MG tablet Take 1 tablet (40 mg total) by mouth daily. 09/27/13   Andrena Mews, DO  cetirizine (ZYRTEC) 10 MG tablet Take 1 tablet (10 mg total) by mouth daily. 12/24/12   Ivy de Lawson Radar, DO  Cranberry-Vitamin C-Probiotic (AZO CRANBERRY PO) Take by mouth daily.    Historical Provider, MD  cyclobenzaprine (FLEXERIL) 5 MG tablet Take 1 tablet (5 mg total) by mouth 3 (three) times daily as  needed for muscle spasms. 05/21/14   Judie BonusElizabeth A Kollar, MD  DULoxetine (CYMBALTA) 60 MG capsule Take 1 capsule (60 mg total) by mouth daily. 05/21/14   Judie BonusElizabeth A Kollar, MD  glimepiride (AMARYL) 4 MG tablet TAKE 1 TABLET (4 MG TOTAL) BY MOUTH DAILY WITH BREAKFAST. 06/23/14   Judie BonusElizabeth A Kollar, MD  INVOKANA 300 MG TABS TAKE 1 TABLET BY MOUTH DAILY. 06/23/14   Judie BonusElizabeth A Kollar, MD  levofloxacin (LEVAQUIN) 500 MG tablet Take 1 tablet (500 mg total) by mouth daily. 01/17/15   Judy PimpleMarne A Tower, MD  lisinopril (PRINIVIL,ZESTRIL) 5 MG tablet Take 1 tablet (5 mg total) by mouth daily. 11/07/14   Judie BonusElizabeth A Kollar, MD  ondansetron (ZOFRAN ODT)  8 MG disintegrating tablet Take 1 tablet (8 mg total) by mouth every 8 (eight) hours as needed for nausea or vomiting. 08/13/13   Marisa Severinlga Otter, MD  pantoprazole (PROTONIX) 20 MG tablet Take 1 tablet (20 mg total) by mouth daily. 09/08/14   Judie BonusElizabeth A Kollar, MD  pregabalin (LYRICA) 150 MG capsule Take 1 capsule (150 mg total) by mouth 2 (two) times daily. 10/31/14   Judie BonusElizabeth A Kollar, MD  pregabalin (LYRICA) 25 MG capsule Take 1 capsule (25 mg total) by mouth 2 (two) times daily. 10/31/14   Judie BonusElizabeth A Kollar, MD  sitaGLIPtin (JANUVIA) 100 MG tablet Take 1 tablet (100 mg total) by mouth daily. 06/23/14   Judie BonusElizabeth A Kollar, MD  traMADol (ULTRAM) 50 MG tablet Take 1 tablet (50 mg total) by mouth every 6 (six) hours as needed. 02/01/15   Linna HoffJames D Tonita Bills, MD  triamcinolone (NASACORT AQ) 55 MCG/ACT AERO nasal inhaler Place 2 sprays into the nose daily. 09/27/13   Andrena MewsMichael D Rigby, DO  TRUETEST TEST test strip USE AS INSTRUCTED 08/20/13   Andrena MewsMichael D Rigby, DO   BP 122/68 mmHg  Pulse 81  Temp(Src) 98.2 F (36.8 C) (Oral)  Resp 20  SpO2 96% Physical Exam  Constitutional: She is oriented to person, place, and time. She appears well-developed and well-nourished. No distress.  HENT:  Mouth/Throat: Oropharynx is clear and moist.  Neck: Normal range of motion. Neck supple.  Cardiovascular: Regular rhythm, normal heart sounds and intact distal pulses.   Pulmonary/Chest: Effort normal and breath sounds normal. She exhibits tenderness.    Abdominal: Soft. Bowel sounds are normal. There is no tenderness.  Lymphadenopathy:    She has no cervical adenopathy.  Neurological: She is alert and oriented to person, place, and time.  Skin: Skin is warm and dry.  Nursing note and vitals reviewed.   ED Course  Procedures (including critical care time) Labs Review Labs Reviewed - No data to display  Imaging Review Dg Chest 2 View  02/01/2015   CLINICAL DATA:  Right-sided chest pain since Saturday.  EXAM: CHEST  2 VIEW   COMPARISON:  Chest CT 02/07/2014  FINDINGS: The heart size and mediastinal contours are within normal limits. Both lungs are clear. The visualized skeletal structures are unremarkable.  IMPRESSION: Normal chest x-ray.   Electronically Signed   By: Rudie MeyerP.  Gallerani M.D.   On: 02/01/2015 19:31    X-rays reviewed and report per radiologist.  MDM   1. Right-sided chest wall pain       Linna HoffJames D Rodolphe Edmonston, MD 02/01/15 1954  Linna HoffJames D Aj Crunkleton, MD 02/02/15 2041

## 2015-02-01 NOTE — ED Notes (Signed)
pT  Reports  r  Side  Pain     Since  yest    Worse  When  She  Coughs  Or  Moves  In   Certain posistions     -  Hurts  To  Take       A  Deep       Breath        she  denys  Any  Vomiting     She  Ambulated  To  Room   With  A  Steady  Fluid  Gait

## 2015-02-03 NOTE — Telephone Encounter (Signed)
Bridget [name],  Please complete this ASAP. The patient continues to follow up, and cannot go back to work until we get her the letter

## 2015-02-03 NOTE — Telephone Encounter (Signed)
FMLA was signed and filled out this morning. If she needs a specific letter to return to work she would need to see someone since I have not seen her for this.

## 2015-02-03 NOTE — Telephone Encounter (Signed)
Patient has called back still needing the doctors note. The letter needs to be retroactive to 01/21/2015 to the date the letter is written.

## 2015-02-04 NOTE — Telephone Encounter (Signed)
Dr. Milinda Antisower,  is this something you would be comfortable writing? You saw her 3 weeks ago for a URI. She is wanting a work note. See notes below, please. Thanks.

## 2015-02-04 NOTE — Telephone Encounter (Signed)
Patient said she saw Dr. Milinda Antisower here at our clinic and the antibiotic that he gave her caused her sugar to drop and caused her lupus to flare up. She did not get a note from Dr. Milinda Antisower because she didn't know that she was not going to be able to work. She didn't know the antibiotic was going to cause her all the trouble that it caused. She said everything is noted in her chart and will someone please write her a note for work. She has an appointment with you on the 20th of this month.

## 2015-02-04 NOTE — Telephone Encounter (Signed)
That is fine (of note she cannot take pcn or sulfa either)  Let me know what date the note should state she can return to work and I will do it

## 2015-02-05 ENCOUNTER — Telehealth: Payer: Self-pay | Admitting: Internal Medicine

## 2015-02-05 NOTE — Telephone Encounter (Signed)
Patient said she needs the note to cover the dates of 01/21/15 through 02/08/15. Thanks for your help.

## 2015-02-05 NOTE — Telephone Encounter (Signed)
I am out of the office today - will do it when I return tomorrow and get it faxed

## 2015-02-05 NOTE — Telephone Encounter (Signed)
Amy/cma for dr Dorise Hiss is aware, patient has been advised, we are waiting for dr tower at [facility] to write letter, patient will be advised after dr tower responds to our message stating he has finished with letter

## 2015-02-05 NOTE — Telephone Encounter (Signed)
Faxed completed FMLA paperwork to [employer]. Sending a copy to charge and scan

## 2015-02-05 NOTE — Telephone Encounter (Signed)
Patient ask if her doctor note was ready, she stated that she need it asap, please advise

## 2015-02-05 NOTE — Telephone Encounter (Signed)
Ok, thanks. I will let patient know.

## 2015-02-06 NOTE — Telephone Encounter (Signed)
Letter faxed to Dr. Lavonna Monarch office

## 2015-02-06 NOTE — Telephone Encounter (Signed)
Please fax out of work letter to PCP Dr Dorise Hiss -thanks

## 2015-02-16 ENCOUNTER — Ambulatory Visit (INDEPENDENT_AMBULATORY_CARE_PROVIDER_SITE_OTHER): Payer: 59 | Admitting: Internal Medicine

## 2015-02-16 ENCOUNTER — Encounter: Payer: Self-pay | Admitting: Internal Medicine

## 2015-02-16 VITALS — BP 118/84 | HR 81 | Temp 98.7°F | Resp 16 | Ht 65.0 in | Wt 234.4 lb

## 2015-02-16 DIAGNOSIS — G8929 Other chronic pain: Secondary | ICD-10-CM | POA: Diagnosis not present

## 2015-02-16 DIAGNOSIS — R52 Pain, unspecified: Secondary | ICD-10-CM

## 2015-02-16 DIAGNOSIS — M329 Systemic lupus erythematosus, unspecified: Secondary | ICD-10-CM

## 2015-02-16 DIAGNOSIS — IMO0002 Reserved for concepts with insufficient information to code with codable children: Secondary | ICD-10-CM

## 2015-02-16 NOTE — Progress Notes (Signed)
   Subjective:    Patient ID: Bridget [name], female    DOB: February 27, 1961, 54 y.o.   MRN: [id number]  HPI The patient is a 54 YO female who is coming in upset today about her FMLA. We recently filled out papers that it was okay for her to miss 6 days per month. She had recently been seen for URI on 01/17/15 and given antibiotics. She then called [place] back on 01/29/15 stating that she had been unable to return to work due to side effects of the antibiotic and then lupus flare that she was still struggling with. She finally returned to work on 02/08/15 and has been still struggling with a pulled muscle that she thinks her dog did. She went to urgent care about that. She is not taking her cymbalta at this time (which works well for her) due to taking tramadol (rx from urgent care). She has also been taking more lyrica than prescribed (taking 250 mg BID from her sister and her medication combined).   Review of Systems  Constitutional: Positive for fatigue. Negative for fever, activity change and appetite change.  Respiratory: Negative for cough, chest tightness, shortness of breath and wheezing.   Cardiovascular: Negative for chest pain, palpitations and leg swelling.  Gastrointestinal: Negative for nausea, diarrhea and constipation.  Genitourinary: Negative for dysuria, frequency, flank pain, vaginal discharge and vaginal pain.  Musculoskeletal: Positive for myalgias and arthralgias. Negative for gait problem.  Skin: Negative.   Neurological: Negative for dizziness, weakness and light-headedness.  Psychiatric/Behavioral: Positive for sleep disturbance.      Objective:   Physical Exam  Constitutional: She is oriented to person, place, and time. She appears well-developed and well-nourished.  Obese  HENT:  Head: Normocephalic and atraumatic.  Eyes: EOM are normal.  Neck: Normal range of motion.  Cardiovascular: Normal rate and regular rhythm.   Pulmonary/Chest: Effort normal and breath sounds normal.  No respiratory distress. She has no wheezes. She has no rales.  Abdominal: Soft. Bowel sounds are normal. She exhibits no distension. There is no rebound and no guarding.  Neurological: She is alert and oriented to person, place, and time. Coordination normal.  Skin: Skin is warm and dry.   Filed Vitals:   02/16/15 1336  BP: 118/84  Pulse: 81  Temp: 98.7 F (37.1 C)  TempSrc: Oral  Resp: 16  Height: 5\' 5"  (1.651 m)  Weight: 234 lb 6.4 oz (106.323 kg)  SpO2: 97%      Assessment & Plan:

## 2015-02-16 NOTE — Patient Instructions (Signed)
Your FMLA papers should already say that it is okay to miss 6 days per month.   We will have you see a rheumatologist to see if there are things that we need to change for the lupus to keep you from having problems.

## 2015-02-16 NOTE — Progress Notes (Signed)
Pre visit review using our clinic review tool, if applicable. No additional management support is needed unless otherwise documented below in the visit note. 

## 2015-02-17 ENCOUNTER — Telehealth: Payer: Self-pay | Admitting: Internal Medicine

## 2015-02-17 ENCOUNTER — Encounter: Payer: Self-pay | Admitting: *Deleted

## 2015-02-17 ENCOUNTER — Other Ambulatory Visit: Payer: Self-pay | Admitting: *Deleted

## 2015-02-17 NOTE — Telephone Encounter (Signed)
Called patient left vm. Received new paperwork for fmla. It is asking for the same info that was previously submitted. Requesting that patient call us back to give explanation as to the reasoning for brand new paperwork in the same month.

## 2015-02-17 NOTE — Assessment & Plan Note (Signed)
Talked to her about the need to get back on cymbalta as this is a maintenance medicine and if she has medicines that she cannot take with it she needs to call our office not talking to the pharmacist alone. She was advised not to share medicines with others and that medicines do not always work better at higher doses and it is dangerous to take doses higher than prescribed.

## 2015-02-17 NOTE — Assessment & Plan Note (Signed)
Unclear to me if she truly has lupus but she is not on any medicine for it. Explained to her that flares are important and she should be calling if she is so sick that she is out of work for 18 days. Referral to rheumatology for their opinion as to whether she needs disease modifying treatment. Currently we are treating mostly fibromyalgia. If they think the lupus is a true diagnosis then we need to change treatment options.

## 2015-02-17 NOTE — Patient Outreach (Signed)
[facility]) Care Management   02/17/2015  Bridget [name] Jan 14, 1961 [id number]  Bridget [name] is an 54 y.o. female who presents for routine Link To Wellness follow up for self management assistance with Type II DM.  Subjective:  Bridget [name] states she is very frustrated, angry and upset because she does not feel she is well enough to continue to work and frequently misses work related to her chronic pain and the side effects associated with the medications she takes for her chronic pain. She says she is also struggling with getting her doctor to complete her temporary medical leave papers to reflect the amount of time she should be allowed to miss work due to her health issues.   Says she has not been checking her blood sugar because of the recent acute health issues she has been dealing with: a toe injury, an upper respiratory infection and a pulled muscle in her right chest. She  Objective:   Review of Systems  Constitutional: Negative.   Musculoskeletal: Positive for joint pain.    Physical Exam  Constitutional: She is oriented to person, place, and time. She appears well-developed and well-nourished. She appears distressed.  Neurological: She is alert and oriented to person, place, and time.  Skin: Skin is warm and dry.  Psychiatric: Her behavior is normal. Thought content normal.   Filed Vitals:   02/17/15 0733  BP: 118/66   Current Medications:   Current Outpatient Prescriptions  Medication Sig Dispense Refill  . Alcohol Swabs (ALCOHOL PREPS) PADS 1 Device by Does not apply route 4 (four) times daily -  before meals and at bedtime. 100 each 11  . amitriptyline (ELAVIL) 25 MG tablet Take 0.5 tablets (12.5 mg total) by mouth at bedtime. 30 tablet 2  . aspirin 81 MG tablet Take 1 tablet (81 mg total) by mouth daily.    Marland Kitchen atorvastatin (LIPITOR) 40 MG tablet Take 1 tablet (40 mg total) by mouth daily. 90 tablet 3  . cetirizine (ZYRTEC) 10 MG tablet Take 1  tablet (10 mg total) by mouth daily. 30 tablet 11  . Cranberry-Vitamin C-Probiotic (AZO CRANBERRY PO) Take by mouth daily.    . cyclobenzaprine (FLEXERIL) 5 MG tablet Take 1 tablet (5 mg total) by mouth 3 (three) times daily as needed for muscle spasms. 30 tablet 1  . DULoxetine (CYMBALTA) 60 MG capsule Take 1 capsule (60 mg total) by mouth daily. (Patient not taking: Reported on 02/16/2015) 30 capsule 3  . glimepiride (AMARYL) 4 MG tablet TAKE 1 TABLET (4 MG TOTAL) BY MOUTH DAILY WITH BREAKFAST. 90 tablet 3  . INVOKANA 300 MG TABS TAKE 1 TABLET BY MOUTH DAILY. 90 tablet 3  . lisinopril (PRINIVIL,ZESTRIL) 5 MG tablet Take 1 tablet (5 mg total) by mouth daily. 30 tablet 5  . ondansetron (ZOFRAN ODT) 8 MG disintegrating tablet Take 1 tablet (8 mg total) by mouth every 8 (eight) hours as needed for nausea or vomiting. 20 tablet 0  . pantoprazole (PROTONIX) 20 MG tablet Take 1 tablet (20 mg total) by mouth daily. 30 tablet 6  . pregabalin (LYRICA) 150 MG capsule Take 1 capsule (150 mg total) by mouth 2 (two) times daily. 60 capsule 2  . pregabalin (LYRICA) 25 MG capsule Take 1 capsule (25 mg total) by mouth 2 (two) times daily. 60 capsule 2  . sitaGLIPtin (JANUVIA) 100 MG tablet Take 1 tablet (100 mg total) by mouth daily. 90 tablet 3  . traMADol (ULTRAM) 50 MG tablet Take  1 tablet (50 mg total) by mouth every 6 (six) hours as needed. 10 tablet 0  . triamcinolone (NASACORT AQ) 55 MCG/ACT AERO nasal inhaler Place 2 sprays into the nose daily. 1 Inhaler 12  . TRUETEST TEST test strip USE AS INSTRUCTED 100 each 11   No current facility-administered medications for this visit.    Functional Status:   In your present state of health, do you have any difficulty performing the following activities: 02/17/2015 12/15/2014  Hearing? N N  Vision? N N  Difficulty concentrating or making decisions? N N  Walking or climbing stairs? Y Y  Dressing or bathing? N N  Doing errands, shopping? - N     Fall/Depression Screening:    PHQ 2/9 Scores 12/15/2014 09/14/2014 09/27/2013 08/07/2013 12/08/2011 11/09/2011  PHQ - 2 Score 1 0 0 0 6 6  PHQ- 9 Score - - - - 6 22     THN CM Care Plan Problem One        Patient Outreach from 02/17/2015 in [facility] [employer]   Care Plan Problem One  Type II DM not meeting A1C target of <7.0% as evidenced by A1C= 8.7% on 12/15/14 at MD office   Care Plan for Problem One  Active   THN Long Term Goal (31-90 days)  Improved glycemic control as evidenced by A1C<8.0% at next 3 month check and 50% of self monitored pre/post prandial blood sugars meet target   THN Long Term Goal Start Date  12/15/14   Interventions for Problem One Long Term Goal  spent 90% of the appointment time allowing Bridget [name] to voice her frustration about her state of health, her frequent absences from work and her inabilty to do her work  effectively which she attributes to the number of sedating medications she must take to control her chronic pain, reviewed the effect stress has on blood sugar control, helped her compose a plan of action to address her concerns including: contacting  social security office to see if she qualifies for supplemental security income,  for a disability attorney, proceeding with the re-referral to a rheumatologist, dicussed strategies to decrease and help her cope with stress, will arrange for Link To Wellness follow up in 2-3 months      Assessment:   [employer] employee and Link To Wellness member with Type II DM not meeting target A1C of <7.0%  Plan:  RNCM to fax today's office visit note to Dr. Dorise Hiss. RNCM will meet quarterly and as needed with patient per Link To Wellness program guidelines to assist with Type II DM self-management and assess patient's progress toward mutually set goals.   Bary Richard RN,CCM,CDE [facility] Care Management Coordinator Link To Wellness Office Phone [phone] Office Fax [phone]

## 2015-02-17 NOTE — Telephone Encounter (Signed)
Patient called back and the paperwork needs to say 6 consecutive days in a month. Can you please call her when you get back in office

## 2015-02-19 ENCOUNTER — Telehealth: Payer: Self-pay | Admitting: Internal Medicine

## 2015-02-19 NOTE — Telephone Encounter (Signed)
disregard

## 2015-02-19 NOTE — Telephone Encounter (Signed)
Dr Dorise Hiss, I spoke to the patient. The patient is advising that she is in pain and that she shouldn't have to go to work in pain. She wants to be written out indefinitely for FMLA because when she takes her meds, she is incompetent. She cannot function @ work with her meds. When she takes her meds, they dont seem to handle her pain.  I advised that her referral to rheumatology is the logical next step in her case. She is going to tell rheumatology. i advised that my recommendation is to not send new information over what we sent earlier in the month. We DID indicate a frequency of 6 days per month @ 1 day per event. The order of those days should be as needed (as indicated on the paperwork).   [place], Please work this referral today for rheumatology if possible. Thanks so much.

## 2015-02-19 NOTE — Telephone Encounter (Signed)
I told her the same at the visit that we did not need more paperwork and she was given a copy of the document we signed then. Please remind her that we are not a disability doctor and cannot advise that she remain out of work.

## 2015-02-20 NOTE — Telephone Encounter (Signed)
Referral faxed to [facility] (Dr. Corliss Skains). See referral for any future information regarding rheumatology referral.

## 2015-02-23 ENCOUNTER — Other Ambulatory Visit: Payer: Self-pay

## 2015-03-09 ENCOUNTER — Ambulatory Visit (INDEPENDENT_AMBULATORY_CARE_PROVIDER_SITE_OTHER): Payer: 59 | Admitting: Internal Medicine

## 2015-03-09 ENCOUNTER — Encounter: Payer: Self-pay | Admitting: Internal Medicine

## 2015-03-09 ENCOUNTER — Other Ambulatory Visit (INDEPENDENT_AMBULATORY_CARE_PROVIDER_SITE_OTHER): Payer: 59

## 2015-03-09 VITALS — BP 112/74 | HR 87 | Temp 98.6°F | Resp 12 | Ht 65.0 in | Wt 233.0 lb

## 2015-03-09 DIAGNOSIS — IMO0002 Reserved for concepts with insufficient information to code with codable children: Secondary | ICD-10-CM

## 2015-03-09 DIAGNOSIS — R52 Pain, unspecified: Secondary | ICD-10-CM

## 2015-03-09 DIAGNOSIS — G8929 Other chronic pain: Secondary | ICD-10-CM

## 2015-03-09 DIAGNOSIS — E669 Obesity, unspecified: Secondary | ICD-10-CM

## 2015-03-09 DIAGNOSIS — E118 Type 2 diabetes mellitus with unspecified complications: Secondary | ICD-10-CM

## 2015-03-09 DIAGNOSIS — I10 Essential (primary) hypertension: Secondary | ICD-10-CM | POA: Diagnosis not present

## 2015-03-09 DIAGNOSIS — E1165 Type 2 diabetes mellitus with hyperglycemia: Secondary | ICD-10-CM

## 2015-03-09 LAB — COMPREHENSIVE METABOLIC PANEL
ALT: 18 U/L (ref 0–35)
AST: 17 U/L (ref 0–37)
Albumin: 4.5 g/dL (ref 3.5–5.2)
Alkaline Phosphatase: 74 U/L (ref 39–117)
BILIRUBIN TOTAL: 0.6 mg/dL (ref 0.2–1.2)
BUN: 17 mg/dL (ref 6–23)
CALCIUM: 9.7 mg/dL (ref 8.4–10.5)
CO2: 28 mEq/L (ref 19–32)
CREATININE: 1.06 mg/dL (ref 0.40–1.20)
Chloride: 102 mEq/L (ref 96–112)
GFR: 69.9 mL/min (ref >60.00)
Glucose, Bld: 207 mg/dL — ABNORMAL HIGH (ref 70–99)
POTASSIUM: 4.2 meq/L (ref 3.5–5.1)
SODIUM: 138 meq/L (ref 135–145)
TOTAL PROTEIN: 7.6 g/dL (ref 6.0–8.3)

## 2015-03-09 LAB — MICROALBUMIN / CREATININE URINE RATIO
CREATININE, U: 81.5 mg/dL
Microalb Creat Ratio: 0.9 mg/g (ref 0.0–30.0)
Microalb, Ur: <0.7 mg/dL (ref 0.0–1.9)

## 2015-03-09 LAB — HEMOGLOBIN A1C: Hgb A1c MFr Bld: 7.7 % — ABNORMAL HIGH (ref 4.6–6.5)

## 2015-03-09 MED ORDER — INVOKANA 300 MG PO TABS: ORAL_TABLET | ORAL | Status: DC

## 2015-03-09 MED ORDER — PREGABALIN 150 MG PO CAPS: 150.0000 mg | ORAL_CAPSULE | Freq: Two times a day (BID) | ORAL | Status: DC

## 2015-03-09 MED ORDER — GLIMEPIRIDE 4 MG PO TABS: ORAL_TABLET | ORAL | Status: DC

## 2015-03-09 MED ORDER — PREGABALIN 25 MG PO CAPS: 25.0000 mg | ORAL_CAPSULE | Freq: Two times a day (BID) | ORAL | Status: DC

## 2015-03-09 MED ORDER — CYCLOBENZAPRINE HCL 5 MG PO TABS: 5.0000 mg | ORAL_TABLET | Freq: Three times a day (TID) | ORAL | Status: DC | PRN

## 2015-03-09 NOTE — Assessment & Plan Note (Signed)
Currently taking her invokana, amaryl, [place]. Last HgA1c 8.9 and if still elevated would recommend adding insulin to her regimen. She is not following good diet and does not exercise. She does have severe neuropathy which I suspect is worsening in the last several months since her control of her sugars has worsened although she does not agree with that assessment. Checking HgA1c and urine microalbumin to creatinine ratio today. She does not like taking invokana due to urinary symptoms so if starting insulin could consider stopping the invokana. She does not tolerate metformin.

## 2015-03-09 NOTE — Assessment & Plan Note (Signed)
BP at goal on lisinopril 5 mg daily. Checking CMP today. Adjust as needed.

## 2015-03-09 NOTE — Assessment & Plan Note (Signed)
The patient spent a significant portion of our visit talking about her FMLA papers and needing more time off work. Currently allowed to take 6 days per month off which is about 20% of the time. Do not feel it is currently appropriate to change that amount of time since it was very recently increased.

## 2015-03-09 NOTE — Progress Notes (Signed)
Pre visit review using our clinic review tool, if applicable. No additional management support is needed unless otherwise documented below in the visit note. 

## 2015-03-09 NOTE — Assessment & Plan Note (Signed)
Weight stable since last visit but still overall up several pounds in the last 6 months. She is not exercising and not doing well with food choices.

## 2015-03-09 NOTE — Patient Instructions (Addendum)
We will check the urine and the blood today for the diabetes. We will also look for a cause for the nail changes and the muscle cramps.   We have sent in your refills and can think about changing the regimen for the diabetes once we get the results.   You should not take the lyrica so close together as the medicine does last about 12 hours. If it is not working it is not okay to take a second pill right away as this can usually worsen any side effects and may not help more with the pain.   We are still waiting to hear back from the rheumatologist's office and will let you know when we hear back about that appointment.   Diabetes and Exercise Exercising regularly is important. It is not just about losing weight. It has many health benefits, such as:  Improving your overall fitness, flexibility, and endurance.  Increasing your bone density.  Helping with weight control.  Decreasing your body fat.  Increasing your muscle strength.  Reducing stress and tension.  Improving your overall health. People with diabetes who exercise gain additional benefits because exercise:  Reduces appetite.  Improves the body's use of blood sugar (glucose).  Helps lower or control blood glucose.  Decreases blood pressure.  Helps control blood lipids (such as cholesterol and triglycerides).  Improves the body's use of the hormone insulin by:  Increasing the body's insulin sensitivity.  Reducing the body's insulin needs.  Decreases the risk for heart disease because exercising:  Lowers cholesterol and triglycerides levels.  Increases the levels of good cholesterol (such as high-density lipoproteins [HDL]) in the body.  Lowers blood glucose levels. YOUR ACTIVITY PLAN  Choose an activity that you enjoy and set realistic goals. Your health care provider or diabetes educator can help you make an activity plan that works for you. Exercise regularly as directed by your health care provider. This  includes:  Performing resistance training twice a week such as push-ups, sit-ups, lifting weights, or using resistance bands.  Performing 150 minutes of cardio exercises each week such as walking, running, or playing sports.  Staying active and spending no more than 90 minutes at one time being inactive. Even short bursts of exercise are good for you. Three 10-minute sessions spread throughout the day are just as beneficial as a single 30-minute session. Some exercise ideas include:  Taking the dog for a walk.  Taking the stairs instead of the elevator.  Dancing to your favorite song.  Doing an exercise video.  Doing your favorite exercise with a friend. RECOMMENDATIONS FOR EXERCISING WITH TYPE 1 OR TYPE 2 DIABETES   Check your blood glucose before exercising. If blood glucose levels are greater than 240 mg/dL, check for urine ketones. Do not exercise if ketones are present.  Avoid injecting insulin into areas of the body that are going to be exercised. For example, avoid injecting insulin into:  The arms when playing tennis.  The legs when jogging.  Keep a record of:  Food intake before and after you exercise.  Expected peak times of insulin action.  Blood glucose levels before and after you exercise.  The type and amount of exercise you have done.  Review your records with your health care provider. Your health care provider will help you to develop guidelines for adjusting food intake and insulin amounts before and after exercising.  If you take insulin or oral hypoglycemic agents, watch for signs and symptoms of hypoglycemia. They include:  Dizziness.  Shaking.  Sweating.  Chills.  Confusion.  Drink plenty of water while you exercise to prevent dehydration or heat stroke. Body water is lost during exercise and must be replaced.  Talk to your health care provider before starting an exercise program to make sure it is safe for you. Remember, almost any type of  activity is better than none. Document Released: 11/05/2003 Document Revised: 12/30/2013 Document Reviewed: 01/22/2013 [facility] Patient Information 2015 [place], [place]. This information is not intended to replace advice given to you by your health care provider. Make sure you discuss any questions you have with your health care provider.

## 2015-03-09 NOTE — Progress Notes (Signed)
   Subjective:    Patient ID: Bridget [name], female    DOB: [birth year], 54 y.o.   MRN: [id number]  HPI The patient is a 54 YO female coming in for follow up of her diabetes. It is not well controlled right now and would complicated by neuropathy which has worsened in the last several months. She previously was not taking her medications well due to feeling sick and in pain and not wanting to take her medication. She is now taking her medicines most of the time. Not exercising at all and not following a good diet. Drinks sodas and other foods with carbs and sugars. Having some side effects from the invokana with burning when she urinates and frequent yeast infections (although she has not previously told us about these at all). She still wants me to sign an FMLA that she cannot work and she feels she should not have to work when she does not feel well. She does have a lot of complaints about her work and her FMLA during the visit and it is hard to get her to talk about her own health.   Review of Systems  Constitutional: Positive for fatigue. Negative for fever, activity change and appetite change.  Respiratory: Negative for cough, chest tightness, shortness of breath and wheezing.   Cardiovascular: Negative for chest pain, palpitations and leg swelling.  Gastrointestinal: Negative for nausea, diarrhea and constipation.  Genitourinary: Positive for dysuria. Negative for frequency, flank pain, vaginal discharge and vaginal pain.  Musculoskeletal: Positive for myalgias, back pain and arthralgias. Negative for gait problem.  Skin: Negative.   Neurological: Negative for dizziness, weakness and light-headedness.  Psychiatric/Behavioral: Positive for sleep disturbance.      Objective:   Physical Exam  Constitutional: She is oriented to person, place, and time. She appears well-developed and well-nourished.  Obese  HENT:  Head: Normocephalic and atraumatic.  Eyes: EOM are normal.  Neck: Normal  range of motion.  Cardiovascular: Normal rate and regular rhythm.   Pulmonary/Chest: Effort normal and breath sounds normal. No respiratory distress. She has no wheezes. She has no rales.  Abdominal: Soft. Bowel sounds are normal. She exhibits no distension. There is no rebound and no guarding.  Neurological: She is alert and oriented to person, place, and time. Coordination normal.  Skin: Skin is warm and dry.  Psychiatric:  Hard to get her to talk about her health and stop taking about her FMLA and her work's interpretation of this   Filed Vitals:   03/09/15 0827  BP: 112/74  Pulse: 87  Temp: 98.6 F (37 C)  Resp: 12  Height: 5\' 5"  (1.651 m)  Weight: 233 lb (105.688 kg)  SpO2: 97%      Assessment & Plan:

## 2015-04-21 ENCOUNTER — Other Ambulatory Visit: Payer: Self-pay | Admitting: *Deleted

## 2015-04-23 NOTE — Patient Outreach (Signed)
[facility]) Care Management   04/21/2015  Bridget [name] [birth year] [id number]  Bridget [name] is an 54 y.o. female who presents to the [facility] CM office for routine Link To Wellness follow up for self management assistance of Type II DM.  Subjective:  Bridget [name] remains frustrated but less angry about her chronic pain and how the medications she takes for the pain interferes with her ability to work at times. She thinks she should qualify for long term disability and says she will speak with a disability attorney before her nest Link To Wellness appointment. She says she did no qualify for social security special assistance because she earns too much money. She saw Dr. Titus [name] and will return for follow up on Oct 5th to discuss lab work.  She says she is not motivated to check her blood sugar, has not been following a CHO controlled meal plan and is not exercising so she attributes the improvement in her A1C to consistent medication adherence. She continues to have significant stress in her life related to her job, damage to her house from a leak in her air conditioning, and chronic pain issues.  Objective:   Review of Systems  Constitutional: Negative.     Physical Exam  Constitutional: She is oriented to person, place, and time. She appears well-developed and well-nourished.  Neurological: She is alert and oriented to person, place, and time.  Skin: Skin is warm and dry.  Psychiatric: She has a normal mood and affect. Her behavior is normal. Judgment and thought content normal.   Filed Vitals:   04/21/15 0745  BP: 130/80   Filed Weights   04/21/15 0745  Weight: 234 lb (106.142 kg)    Current Medications:   Current Outpatient Prescriptions  Medication Sig Dispense Refill  . Alcohol Swabs (ALCOHOL PREPS) PADS 1 Device by Does not apply route 4 (four) times daily -  before meals and at bedtime. 100 each 11  . amitriptyline (ELAVIL) 25 MG tablet Take 0.5  tablets (12.5 mg total) by mouth at bedtime. 30 tablet 2  . aspirin 81 MG tablet Take 1 tablet (81 mg total) by mouth daily.    Marland Kitchen atorvastatin (LIPITOR) 40 MG tablet Take 1 tablet (40 mg total) by mouth daily. 90 tablet 3  . cetirizine (ZYRTEC) 10 MG tablet Take 1 tablet (10 mg total) by mouth daily. 30 tablet 11  . Cranberry-Vitamin C-Probiotic (AZO CRANBERRY PO) Take by mouth daily.    . cyclobenzaprine (FLEXERIL) 5 MG tablet Take 1 tablet (5 mg total) by mouth 3 (three) times daily as needed for muscle spasms. 90 tablet 1  . glimepiride (AMARYL) 4 MG tablet TAKE 1 TABLET (4 MG TOTAL) BY MOUTH DAILY WITH BREAKFAST. 90 tablet 3  . INVOKANA 300 MG TABS tablet TAKE 1 TABLET BY MOUTH DAILY. 90 tablet 3  . lisinopril (PRINIVIL,ZESTRIL) 5 MG tablet Take 1 tablet (5 mg total) by mouth daily. 30 tablet 5  . pantoprazole (PROTONIX) 20 MG tablet Take 1 tablet (20 mg total) by mouth daily. 30 tablet 6  . pregabalin (LYRICA) 150 MG capsule Take 1 capsule (150 mg total) by mouth 2 (two) times daily. 60 capsule 2  . pregabalin (LYRICA) 25 MG capsule Take 1 capsule (25 mg total) by mouth 2 (two) times daily. 60 capsule 2  . sitaGLIPtin (JANUVIA) 100 MG tablet Take 1 tablet (100 mg total) by mouth daily. 90 tablet 3  . traMADol (ULTRAM) 50 MG tablet Take 1 tablet (  50 mg total) by mouth every 6 (six) hours as needed. 10 tablet 0  . triamcinolone (NASACORT AQ) 55 MCG/ACT AERO nasal inhaler Place 2 sprays into the nose daily. 1 Inhaler 12  . TRUETEST TEST test strip USE AS INSTRUCTED 100 each 11  . DULoxetine (CYMBALTA) 60 MG capsule Take 1 capsule (60 mg total) by mouth daily. (Patient not taking: Reported on 04/21/2015) 30 capsule 3  . ondansetron (ZOFRAN ODT) 8 MG disintegrating tablet Take 1 tablet (8 mg total) by mouth every 8 (eight) hours as needed for nausea or vomiting. (Patient not taking: Reported on 03/09/2015) 20 tablet 0   No current facility-administered medications for this visit.    Functional  Status:   In your present state of health, do you have any difficulty performing the following activities: 04/21/2015 02/17/2015  Hearing? N -  Vision? N N  Difficulty concentrating or making decisions? N -  Walking or climbing stairs? Y Y  Dressing or bathing? N N  Doing errands, shopping? N -    Fall/Depression Screening:    PHQ 2/9 Scores 12/15/2014 09/14/2014 09/27/2013 08/07/2013 12/08/2011 11/09/2011  PHQ - 2 Score 1 0 0 0 6 6  PHQ- 9 Score - - - - 6 22   THN CM Care Plan Problem One        Most Recent Value   Care Plan Problem One  Type II DM not meeting A1C target of <7.0% as evidenced by A1C= 7.7% on 03/09/15 but with improved glycemic control as evidenced by previous A1C= 8.7% on 12/15/14    Role Documenting the Problem One  Care Management Coordinator   Care Plan for Problem One  Active   THN Long Term Goal (31-90 days)  Continued improved glycemic control as evidenced by A1C<7.5% at next 3 month check    THN Long Term Goal Start Date  04/21/15   Interventions for Problem One Long Term Goal  again allowed Bridget [name] to voice her frustration about her state of health, her frequent absences from work and her inabilty to do her work  effectively which she attributes to the number of sedating medications she must take to control her chronic pain, reviewed the effect stress has on blood sugar control, commended her on taking action to address her concerns by contacting the social security office, and proceeding with the re-referral to a rheumatologist, encourged her to contact a disability lawyer to see if she will qualify for long term disability, reviewed strategies to decrease and help her cope with stress, congratulated her on lowering her A1C by 1.0% and discussed how she accomplished this, arranged for Link To Wellness follow up in November      Assessment:   [employer] employee and Link To Wellness member with Type II DM showing improved glycemic control as evidenced by 1.0% decrease in  A1C over last 4 months but not yet meeting A1C target of <7.0%  Plan:  RNCM to fax today's office visit note to Dr. Dorise [name]. RNCM will meet quarterly and as needed with patient per Link To Wellness program guidelines to assist with Type II DM self-management and assess patient's progress toward mutually set goals.  Bary Richard RN,CCM,CDE [facility] Care Management Coordinator Link To Wellness Office Phone [phone] Office Fax [phone]

## 2015-06-04 ENCOUNTER — Other Ambulatory Visit (HOSPITAL_COMMUNITY): Payer: Self-pay | Admitting: Rheumatology

## 2015-06-04 DIAGNOSIS — M25512 Pain in left shoulder: Secondary | ICD-10-CM

## 2015-06-10 ENCOUNTER — Other Ambulatory Visit (HOSPITAL_COMMUNITY): Payer: Self-pay | Admitting: Rheumatology

## 2015-06-10 DIAGNOSIS — M25512 Pain in left shoulder: Secondary | ICD-10-CM

## 2015-06-11 ENCOUNTER — Ambulatory Visit (HOSPITAL_COMMUNITY)
Admission: RE | Admit: 2015-06-11 | Discharge: 2015-06-11 | Disposition: A | Discharge status: Home / Self Care | Payer: 59 | Source: Ambulatory Visit | Attending: Rheumatology | Admitting: Rheumatology

## 2015-06-11 DIAGNOSIS — M19011 Primary osteoarthritis, right shoulder: Secondary | ICD-10-CM | POA: Diagnosis not present

## 2015-06-11 DIAGNOSIS — M25512 Pain in left shoulder: Secondary | ICD-10-CM | POA: Diagnosis present

## 2015-06-11 DIAGNOSIS — M75112 Incomplete rotator cuff tear or rupture of left shoulder, not specified as traumatic: Secondary | ICD-10-CM | POA: Diagnosis not present

## 2015-06-11 DIAGNOSIS — X58XXXA Exposure to other specified factors, initial encounter: Secondary | ICD-10-CM | POA: Insufficient documentation

## 2015-06-11 DIAGNOSIS — S43402A Unspecified sprain of left shoulder joint, initial encounter: Secondary | ICD-10-CM | POA: Diagnosis not present

## 2015-07-06 ENCOUNTER — Other Ambulatory Visit: Payer: Self-pay | Admitting: Orthopedic Surgery

## 2015-07-14 ENCOUNTER — Encounter (HOSPITAL_BASED_OUTPATIENT_CLINIC_OR_DEPARTMENT_OTHER): Payer: Self-pay | Admitting: *Deleted

## 2015-07-15 ENCOUNTER — Other Ambulatory Visit: Payer: Self-pay | Admitting: *Deleted

## 2015-07-15 NOTE — Patient Outreach (Signed)
[facility] will have shoulder surgery on 11/21 and will need to reschedule her 11/22 Link To Wellness appointment until after the first of the year as she will be on medical leave to recover from surgery.  [facility]ied [facility] e-mail expressing gratitude for notifying this [facility] and advising her that  this RNCM will make outreach to assess her recovery from surgery. Bary RichardJanet S. Chrystel Barefield RN,CCM,CDE [facility] Care Management Coordinator Link To Wellness Office Phone [phone] Office Fax [phone]

## 2015-07-17 ENCOUNTER — Encounter (HOSPITAL_BASED_OUTPATIENT_CLINIC_OR_DEPARTMENT_OTHER)
Admission: RE | Admit: 2015-07-17 | Discharge: 2015-07-17 | Disposition: A | Discharge status: Home / Self Care | Payer: 59 | Source: Ambulatory Visit | Attending: Orthopedic Surgery | Admitting: Orthopedic Surgery

## 2015-07-17 DIAGNOSIS — I10 Essential (primary) hypertension: Secondary | ICD-10-CM | POA: Diagnosis not present

## 2015-07-17 DIAGNOSIS — M797 Fibromyalgia: Secondary | ICD-10-CM | POA: Diagnosis not present

## 2015-07-17 DIAGNOSIS — Z87891 Personal history of nicotine dependence: Secondary | ICD-10-CM | POA: Diagnosis not present

## 2015-07-17 DIAGNOSIS — Z6839 Body mass index (BMI) 39.0-39.9, adult: Secondary | ICD-10-CM | POA: Diagnosis not present

## 2015-07-17 DIAGNOSIS — Z79899 Other long term (current) drug therapy: Secondary | ICD-10-CM | POA: Diagnosis not present

## 2015-07-17 DIAGNOSIS — Z7984 Long term (current) use of oral hypoglycemic drugs: Secondary | ICD-10-CM | POA: Diagnosis not present

## 2015-07-17 DIAGNOSIS — Z7982 Long term (current) use of aspirin: Secondary | ICD-10-CM | POA: Diagnosis not present

## 2015-07-17 DIAGNOSIS — F329 Major depressive disorder, single episode, unspecified: Secondary | ICD-10-CM | POA: Diagnosis not present

## 2015-07-17 DIAGNOSIS — E1151 Type 2 diabetes mellitus with diabetic peripheral angiopathy without gangrene: Secondary | ICD-10-CM | POA: Diagnosis not present

## 2015-07-17 DIAGNOSIS — Z7951 Long term (current) use of inhaled steroids: Secondary | ICD-10-CM | POA: Diagnosis not present

## 2015-07-17 DIAGNOSIS — M75102 Unspecified rotator cuff tear or rupture of left shoulder, not specified as traumatic: Secondary | ICD-10-CM | POA: Diagnosis not present

## 2015-07-17 DIAGNOSIS — M19012 Primary osteoarthritis, left shoulder: Secondary | ICD-10-CM | POA: Diagnosis not present

## 2015-07-17 DIAGNOSIS — K219 Gastro-esophageal reflux disease without esophagitis: Secondary | ICD-10-CM | POA: Diagnosis not present

## 2015-07-17 LAB — BASIC METABOLIC PANEL
Anion gap: 8 (ref 5–15)
BUN: 17 mg/dL (ref 6–20)
CALCIUM: 9.7 mg/dL (ref 8.9–10.3)
CO2: 23 mmol/L (ref 22–32)
CREATININE: 0.86 mg/dL (ref 0.44–1.00)
Chloride: 108 mmol/L (ref 101–111)
GFR calc Af Amer: >60 mL/min (ref >60)
GLUCOSE: 174 mg/dL — AB (ref 65–99)
Potassium: 4.3 mmol/L (ref 3.5–5.1)
Sodium: 139 mmol/L (ref 135–145)

## 2015-07-20 ENCOUNTER — Ambulatory Visit (HOSPITAL_BASED_OUTPATIENT_CLINIC_OR_DEPARTMENT_OTHER): Payer: 59 | Admitting: Anesthesiology

## 2015-07-20 ENCOUNTER — Encounter (HOSPITAL_BASED_OUTPATIENT_CLINIC_OR_DEPARTMENT_OTHER): Payer: Self-pay | Admitting: Anesthesiology

## 2015-07-20 ENCOUNTER — Encounter (HOSPITAL_BASED_OUTPATIENT_CLINIC_OR_DEPARTMENT_OTHER)
Admission: RE | Disposition: A | Discharge status: Home / Self Care | Payer: Self-pay | Source: Ambulatory Visit | Attending: Orthopedic Surgery

## 2015-07-20 ENCOUNTER — Ambulatory Visit (HOSPITAL_BASED_OUTPATIENT_CLINIC_OR_DEPARTMENT_OTHER)
Admission: RE | Admit: 2015-07-20 | Discharge: 2015-07-20 | Disposition: A | Discharge status: Home / Self Care | Payer: 59 | Source: Ambulatory Visit | Attending: Orthopedic Surgery | Admitting: Orthopedic Surgery

## 2015-07-20 DIAGNOSIS — Z79899 Other long term (current) drug therapy: Secondary | ICD-10-CM | POA: Insufficient documentation

## 2015-07-20 DIAGNOSIS — M19012 Primary osteoarthritis, left shoulder: Secondary | ICD-10-CM | POA: Diagnosis not present

## 2015-07-20 DIAGNOSIS — Z87891 Personal history of nicotine dependence: Secondary | ICD-10-CM | POA: Insufficient documentation

## 2015-07-20 DIAGNOSIS — I10 Essential (primary) hypertension: Secondary | ICD-10-CM | POA: Insufficient documentation

## 2015-07-20 DIAGNOSIS — K219 Gastro-esophageal reflux disease without esophagitis: Secondary | ICD-10-CM | POA: Insufficient documentation

## 2015-07-20 DIAGNOSIS — E1151 Type 2 diabetes mellitus with diabetic peripheral angiopathy without gangrene: Secondary | ICD-10-CM | POA: Insufficient documentation

## 2015-07-20 DIAGNOSIS — Z7951 Long term (current) use of inhaled steroids: Secondary | ICD-10-CM | POA: Insufficient documentation

## 2015-07-20 DIAGNOSIS — F329 Major depressive disorder, single episode, unspecified: Secondary | ICD-10-CM | POA: Insufficient documentation

## 2015-07-20 DIAGNOSIS — M797 Fibromyalgia: Secondary | ICD-10-CM | POA: Insufficient documentation

## 2015-07-20 DIAGNOSIS — Z6839 Body mass index (BMI) 39.0-39.9, adult: Secondary | ICD-10-CM | POA: Insufficient documentation

## 2015-07-20 DIAGNOSIS — Z7982 Long term (current) use of aspirin: Secondary | ICD-10-CM | POA: Insufficient documentation

## 2015-07-20 DIAGNOSIS — M75102 Unspecified rotator cuff tear or rupture of left shoulder, not specified as traumatic: Secondary | ICD-10-CM | POA: Insufficient documentation

## 2015-07-20 DIAGNOSIS — Z7984 Long term (current) use of oral hypoglycemic drugs: Secondary | ICD-10-CM | POA: Insufficient documentation

## 2015-07-20 HISTORY — DX: Essential (primary) hypertension: I10

## 2015-07-20 HISTORY — DX: Depression, unspecified: F32.A

## 2015-07-20 HISTORY — DX: Fibromyalgia: M79.7

## 2015-07-20 HISTORY — DX: Major depressive disorder, single episode, unspecified: F32.9

## 2015-07-20 HISTORY — PX: SHOULDER ARTHROSCOPY WITH SUBACROMIAL DECOMPRESSION: SHX5684

## 2015-07-20 HISTORY — DX: Headache, unspecified: R51.9

## 2015-07-20 HISTORY — DX: Headache: R51

## 2015-07-20 LAB — GLUCOSE, CAPILLARY
GLUCOSE-CAPILLARY: 166 mg/dL — AB (ref 65–99)
GLUCOSE-CAPILLARY: 180 mg/dL — AB (ref 65–99)

## 2015-07-20 SURGERY — SHOULDER ARTHROSCOPY WITH SUBACROMIAL DECOMPRESSION
Anesthesia: Regional | Site: Shoulder | Laterality: Left

## 2015-07-20 MED ORDER — SCOPOLAMINE 1 MG/3DAYS TD PT72: 1.0000 | MEDICATED_PATCH | Freq: Once | TRANSDERMAL | Status: DC | PRN

## 2015-07-20 MED ORDER — OXYCODONE-ACETAMINOPHEN 5-325 MG PO TABS: 1.0000 | ORAL_TABLET | ORAL | Status: DC | PRN

## 2015-07-20 MED ORDER — SCOPOLAMINE 1 MG/3DAYS TD PT72
MEDICATED_PATCH | TRANSDERMAL | Status: AC
  Filled 2015-07-20: qty 1

## 2015-07-20 MED ORDER — FENTANYL CITRATE (PF) 100 MCG/2ML IJ SOLN
50.0000 ug | INTRAMUSCULAR | Status: DC | PRN
  Administered 2015-07-20: 50 ug via INTRAVENOUS
  Administered 2015-07-20: 100 ug via INTRAVENOUS

## 2015-07-20 MED ORDER — GLYCOPYRROLATE 0.2 MG/ML IJ SOLN: 0.2000 mg | Freq: Once | INTRAMUSCULAR | Status: DC | PRN

## 2015-07-20 MED ORDER — PROPOFOL 10 MG/ML IV BOLUS
INTRAVENOUS | Status: DC | PRN
Start: 2015-07-20 — End: 2015-07-20
  Administered 2015-07-20: 100 mg via INTRAVENOUS
  Administered 2015-07-20: 200 mg via INTRAVENOUS

## 2015-07-20 MED ORDER — MIDAZOLAM HCL 2 MG/2ML IJ SOLN
INTRAMUSCULAR | Status: AC
  Filled 2015-07-20: qty 2

## 2015-07-20 MED ORDER — POVIDONE-IODINE 7.5 % EX SOLN: Freq: Once | CUTANEOUS | Status: DC

## 2015-07-20 MED ORDER — ONDANSETRON HCL 4 MG/2ML IJ SOLN
INTRAMUSCULAR | Status: AC
  Filled 2015-07-20: qty 2

## 2015-07-20 MED ORDER — FENTANYL CITRATE (PF) 100 MCG/2ML IJ SOLN
INTRAMUSCULAR | Status: AC
  Filled 2015-07-20: qty 2

## 2015-07-20 MED ORDER — SUCCINYLCHOLINE CHLORIDE 20 MG/ML IJ SOLN
INTRAMUSCULAR | Status: DC | PRN
  Administered 2015-07-20 (×2): 50 mg via INTRAVENOUS

## 2015-07-20 MED ORDER — VANCOMYCIN HCL IN DEXTROSE 1-5 GM/200ML-% IV SOLN
1000.0000 mg | INTRAVENOUS | Status: AC
  Administered 2015-07-20 (×2): 1000 mg via INTRAVENOUS

## 2015-07-20 MED ORDER — FENTANYL CITRATE (PF) 100 MCG/2ML IJ SOLN
INTRAMUSCULAR | Status: AC
Start: 2015-07-20 — End: 2015-07-20
  Filled 2015-07-20: qty 2

## 2015-07-20 MED ORDER — DEXAMETHASONE SODIUM PHOSPHATE 10 MG/ML IJ SOLN
INTRAMUSCULAR | Status: AC
  Filled 2015-07-20: qty 1

## 2015-07-20 MED ORDER — DEXAMETHASONE SODIUM PHOSPHATE 4 MG/ML IJ SOLN
INTRAMUSCULAR | Status: DC | PRN
  Administered 2015-07-20: 10 mg via INTRAVENOUS

## 2015-07-20 MED ORDER — ONDANSETRON HCL 4 MG/2ML IJ SOLN
INTRAMUSCULAR | Status: DC | PRN
  Administered 2015-07-20: 4 mg via INTRAVENOUS

## 2015-07-20 MED ORDER — LIDOCAINE HCL (CARDIAC) 20 MG/ML IV SOLN
INTRAVENOUS | Status: AC
  Filled 2015-07-20: qty 5

## 2015-07-20 MED ORDER — MIDAZOLAM HCL 5 MG/5ML IJ SOLN
INTRAMUSCULAR | Status: DC | PRN
Start: 2015-07-20 — End: 2015-07-20
  Administered 2015-07-20: 2 mg via INTRAVENOUS

## 2015-07-20 MED ORDER — ARTIFICIAL TEARS OP OINT
TOPICAL_OINTMENT | OPHTHALMIC | Status: AC
  Filled 2015-07-20: qty 3.5

## 2015-07-20 MED ORDER — SUCCINYLCHOLINE CHLORIDE 20 MG/ML IJ SOLN
INTRAMUSCULAR | Status: AC
  Filled 2015-07-20: qty 1

## 2015-07-20 MED ORDER — LACTATED RINGERS IV SOLN
INTRAVENOUS | Status: DC
  Administered 2015-07-20 (×2): via INTRAVENOUS

## 2015-07-20 MED ORDER — DOCUSATE SODIUM 100 MG PO CAPS: 100.0000 mg | ORAL_CAPSULE | Freq: Three times a day (TID) | ORAL | Status: AC | PRN

## 2015-07-20 MED ORDER — SODIUM CHLORIDE 0.9 % IR SOLN
Status: DC | PRN
  Administered 2015-07-20: 3000 mL

## 2015-07-20 MED ORDER — LIDOCAINE HCL (CARDIAC) 20 MG/ML IV SOLN
INTRAVENOUS | Status: DC | PRN
  Administered 2015-07-20: 50 mg via INTRAVENOUS

## 2015-07-20 MED ORDER — BUPIVACAINE-EPINEPHRINE (PF) 0.5% -1:200000 IJ SOLN
INTRAMUSCULAR | Status: DC | PRN
  Administered 2015-07-20: 30 mL

## 2015-07-20 MED ORDER — PROPOFOL 10 MG/ML IV BOLUS
INTRAVENOUS | Status: AC
  Filled 2015-07-20: qty 20

## 2015-07-20 MED ORDER — MIDAZOLAM HCL 2 MG/2ML IJ SOLN
1.0000 mg | INTRAMUSCULAR | Status: DC | PRN
  Administered 2015-07-20: 2 mg via INTRAVENOUS

## 2015-07-20 MED ORDER — VANCOMYCIN HCL IN DEXTROSE 1-5 GM/200ML-% IV SOLN
INTRAVENOUS | Status: AC
  Filled 2015-07-20: qty 200

## 2015-07-20 SURGICAL SUPPLY — 82 items
APL SKNCLS STERI-STRIP NONHPOA (GAUZE/BANDAGES/DRESSINGS)
BENZOIN TINCTURE PRP APPL 2/3 (GAUZE/BANDAGES/DRESSINGS) IMPLANT
BLADE CLIPPER SURG (BLADE) IMPLANT
BLADE SURG 15 STRL LF DISP TIS (BLADE) IMPLANT
BLADE SURG 15 STRL SS (BLADE)
BUR OVAL 4.0 (BURR) ×2 IMPLANT
CANNULA 5.75X71 LONG (CANNULA) ×2 IMPLANT
CANNULA TWIST IN 8.25X7CM (CANNULA) IMPLANT
CHLORAPREP W/TINT 26ML (MISCELLANEOUS) ×2 IMPLANT
DECANTER SPIKE VIAL GLASS SM (MISCELLANEOUS) IMPLANT
DRAPE INCISE IOBAN 66X45 STRL (DRAPES) ×2 IMPLANT
DRAPE STERI 35X30 U-POUCH (DRAPES) ×2 IMPLANT
DRAPE SURG 17X23 STRL (DRAPES) ×2 IMPLANT
DRAPE U 20/CS (DRAPES) ×2 IMPLANT
DRAPE U-SHAPE 47X51 STRL (DRAPES) ×2 IMPLANT
DRAPE U-SHAPE 76X120 STRL (DRAPES) ×4 IMPLANT
DRSG PAD ABDOMINAL 8X10 ST (GAUZE/BANDAGES/DRESSINGS) ×2 IMPLANT
ELECT REM PT RETURN 9FT ADLT (ELECTROSURGICAL) ×2
ELECTRODE REM PT RTRN 9FT ADLT (ELECTROSURGICAL) ×1 IMPLANT
GAUZE SPONGE 4X4 12PLY STRL (GAUZE/BANDAGES/DRESSINGS) ×2 IMPLANT
GAUZE SPONGE 4X4 16PLY XRAY LF (GAUZE/BANDAGES/DRESSINGS) IMPLANT
GAUZE XEROFORM 1X8 LF (GAUZE/BANDAGES/DRESSINGS) ×2 IMPLANT
GLOVE BIO SURGEON STRL SZ7 (GLOVE) ×2 IMPLANT
GLOVE BIO SURGEON STRL SZ7.5 (GLOVE) ×2 IMPLANT
GLOVE BIOGEL PI IND STRL 7.0 (GLOVE) ×1 IMPLANT
GLOVE BIOGEL PI IND STRL 8 (GLOVE) ×1 IMPLANT
GLOVE BIOGEL PI INDICATOR 7.0 (GLOVE) ×2
GLOVE BIOGEL PI INDICATOR 8 (GLOVE) ×1
GLOVE ECLIPSE 6.5 STRL STRAW (GLOVE) ×1 IMPLANT
GLOVE EXAM NITRILE EXT CUFF MD (GLOVE) ×1 IMPLANT
GOWN STRL REUS W/ TWL LRG LVL3 (GOWN DISPOSABLE) ×2 IMPLANT
GOWN STRL REUS W/ TWL XL LVL3 (GOWN DISPOSABLE) ×1 IMPLANT
GOWN STRL REUS W/TWL LRG LVL3 (GOWN DISPOSABLE) ×6
GOWN STRL REUS W/TWL XL LVL3 (GOWN DISPOSABLE) ×2
LASSO CRESCENT QUICKPASS (SUTURE) IMPLANT
LIQUID BAND (GAUZE/BANDAGES/DRESSINGS) IMPLANT
MANIFOLD NEPTUNE II (INSTRUMENTS) ×2 IMPLANT
NDL 1/2 CIR CATGUT .05X1.09 (NEEDLE) IMPLANT
NDL SCORPION MULTI FIRE (NEEDLE) IMPLANT
NDL SUT 6 .5 CRC .975X.05 MAYO (NEEDLE) IMPLANT
NEEDLE 1/2 CIR CATGUT .05X1.09 (NEEDLE) IMPLANT
NEEDLE MAYO TAPER (NEEDLE)
NEEDLE SCORPION MULTI FIRE (NEEDLE) IMPLANT
NS IRRIG 1000ML POUR BTL (IV SOLUTION) IMPLANT
PACK ARTHROSCOPY DSU (CUSTOM PROCEDURE TRAY) ×2 IMPLANT
PACK BASIN DAY SURGERY FS (CUSTOM PROCEDURE TRAY) ×2 IMPLANT
PENCIL BUTTON HOLSTER BLD 10FT (ELECTRODE) IMPLANT
RESECTOR FULL RADIUS 4.2MM (BLADE) ×2 IMPLANT
SHEET MEDIUM DRAPE 40X70 STRL (DRAPES) IMPLANT
SLEEVE SCD COMPRESS KNEE MED (MISCELLANEOUS) ×2 IMPLANT
SLING ARM FOAM STRAP LRG (SOFTGOODS) ×1 IMPLANT
SLING ARM IMMOBILIZER MED (SOFTGOODS) IMPLANT
SLING ARM MED ADULT FOAM STRAP (SOFTGOODS) IMPLANT
SLING ARM XL FOAM STRAP (SOFTGOODS) IMPLANT
SPONGE LAP 4X18 X RAY DECT (DISPOSABLE) IMPLANT
STRIP CLOSURE SKIN 1/2X4 (GAUZE/BANDAGES/DRESSINGS) IMPLANT
SUCTION FRAZIER TIP 10 FR DISP (SUCTIONS) IMPLANT
SUPPORT WRAP ARM LG (MISCELLANEOUS) IMPLANT
SUT BONE WAX W31G (SUTURE) IMPLANT
SUT ETHIBOND 2 OS 4 DA (SUTURE) IMPLANT
SUT ETHILON 3 0 PS 1 (SUTURE) ×2 IMPLANT
SUT ETHILON 4 0 PS 2 18 (SUTURE) IMPLANT
SUT FIBERWIRE #2 38 T-5 BLUE (SUTURE)
SUT MNCRL AB 3-0 PS2 18 (SUTURE) IMPLANT
SUT MNCRL AB 4-0 PS2 18 (SUTURE) IMPLANT
SUT PDS AB 0 CT 36 (SUTURE) IMPLANT
SUT PROLENE 3 0 PS 2 (SUTURE) IMPLANT
SUT TIGER TAPE 7 IN WHITE (SUTURE) IMPLANT
SUT VIC AB 0 CT1 27 (SUTURE)
SUT VIC AB 0 CT1 27XBRD ANBCTR (SUTURE) IMPLANT
SUT VIC AB 2-0 SH 27 (SUTURE)
SUT VIC AB 2-0 SH 27XBRD (SUTURE) IMPLANT
SUTURE FIBERWR #2 38 T-5 BLUE (SUTURE) IMPLANT
SYR BULB 3OZ (MISCELLANEOUS) IMPLANT
TAPE FIBER 2MM 7IN #2 BLUE (SUTURE) IMPLANT
TOWEL OR 17X24 6PK STRL BLUE (TOWEL DISPOSABLE) ×2 IMPLANT
TOWEL OR NON WOVEN STRL DISP B (DISPOSABLE) ×1 IMPLANT
TUBE CONNECTING 20X1/4 (TUBING) ×2 IMPLANT
TUBING ARTHROSCOPY IRRIG 16FT (MISCELLANEOUS) ×2 IMPLANT
WAND STAR VAC 90 (SURGICAL WAND) ×2 IMPLANT
WATER STERILE IRR 1000ML POUR (IV SOLUTION) ×2 IMPLANT
YANKAUER SUCT BULB TIP NO VENT (SUCTIONS) IMPLANT

## 2015-07-20 NOTE — Progress Notes (Signed)
Assisted Dr. Germeroth with left, ultrasound guided, interscalene  block. Side rails up, monitors on throughout procedure. See vital signs in flow sheet. Tolerated Procedure well. 

## 2015-07-20 NOTE — Anesthesia Procedure Notes (Addendum)
Anesthesia Regional Block:  Interscalene brachial plexus block  Pre-Anesthetic Checklist: ,, timeout performed, Correct Patient, Correct Site, Correct Laterality, Correct Procedure, Correct Position, site marked, Risks and benefits discussed, Surgical consent,  Pre-op evaluation,  Post-op pain management  Laterality: Left  Prep: chloraprep       Needles:  Injection technique: Single-shot  Needle Type: Stimiplex          Additional Needles:  Procedures: ultrasound guided (picture in chart) Interscalene brachial plexus block Narrative:   Performed by: Personally  Anesthesiologist: Lewie LoronGERMEROTH, JOHN  Additional Notes: Patient tolerated the procedure well without complications   Procedure Name: Intubation Date/Time: 07/20/2015 11:55 AM Performed by: Genevieve NorlanderLINKA, Myrtle Barnhard L Pre-anesthesia Checklist: Patient identified, Emergency Drugs available, Suction available, Patient being monitored and Timeout performed Patient Re-evaluated:Patient Re-evaluated prior to inductionOxygen Delivery Method: Circle System Utilized Preoxygenation: Pre-oxygenation with 100% oxygen Intubation Type: IV induction Ventilation: Mask ventilation without difficulty Laryngoscope Size: Glidescope, Mac and 3 Grade View: Grade II Tube type: Oral Tube size: 7.0 mm Number of attempts: 1 Airway Equipment and Method: Stylet and Oral airway Placement Confirmation: ETT inserted through vocal cords under direct vision,  positive ETCO2 and breath sounds checked- equal and bilateral Secured at: 21 cm Tube secured with: Tape Dental Injury: Teeth and Oropharynx as per pre-operative assessment  Difficulty Due To: Difficult Airway- due to reduced neck mobility, Difficult Airway- due to large tongue and Difficulty was unanticipated Comments: Direct laryngoscopy by Leonor LivLinka, CRNA x 1.  Cords visible, but small mouth opening and large tongue prohibited intubation attempt.  Successful intubation with Glidescope as described.

## 2015-07-20 NOTE — Anesthesia Postprocedure Evaluation (Signed)
Anesthesia Post Note  Patient: Bridget [name]  Procedure(s) Performed: Procedure(s) (LRB): SHOULDER ARTHROSCOPY WITH DEBRIDEMENT OF ROTATOR CUFF AND SUBACROMIAL DECOMPRESSION  (Left)  Patient location during evaluation: PACU Anesthesia Type: General and Regional Level of consciousness: sedated and patient cooperative Pain management: pain level controlled Vital Signs Assessment: post-procedure vital signs reviewed and stable Respiratory status: spontaneous breathing Cardiovascular status: stable Anesthetic complications: no    Last Vitals:  Filed Vitals:   07/20/15 1345 07/20/15 1400  BP: 141/87 113/69  Pulse: 83 84  Temp:    Resp: 14 18    Last Pain:  Filed Vitals:   07/20/15 1416  PainSc: 0-No pain                 Bridget [name]

## 2015-07-20 NOTE — Transfer of Care (Signed)
Immediate Anesthesia Transfer of Care Note  Patient: Bridget [name]  Procedure(s) Performed: Procedure(s) with comments: SHOULDER ARTHROSCOPY WITH DEBRIDEMENT OF ROTATOR CUFF AND SUBACROMIAL DECOMPRESSION  (Left) - Left shoulder arthroscopy with debridement of rotator cuff and subacromial decompression  Patient Location: PACU  Anesthesia Type:GA combined with regional for post-op pain  Level of Consciousness: sedated and patient cooperative  Airway & Oxygen Therapy: Patient Spontanous Breathing and Patient connected to face mask oxygen  Post-op Assessment: Report given to RN and Post -op Vital signs reviewed and stable  Post vital signs: Reviewed and stable  Last Vitals:  Filed Vitals:   07/20/15 1010 07/20/15 1318  BP: 126/60 148/86  Pulse: 82 94  Temp: 36.9 C   Resp: 20     Complications: No apparent anesthesia complications

## 2015-07-20 NOTE — Op Note (Signed)
Procedure(s): Procedure Note  Bridget [name] female 54 y.o. 07/20/2015  Procedure(s) and Anesthesia Type:    #1 left shoulder arthroscopic debridement a arthritis, partial-thickness rotator cuff tear, labral tear #2 left shoulder arthroscopic subacromial decompression  Surgeon(s) and Role:    * Jones Broom, MD - Primary     Surgeon: Mable Paris   Assistants: Damita Lack PA-C (Danielle was present and scrubbed throughout the procedure and was essential in positioning, assisting with the camera and instrumentation,, and closure)  Anesthesia: General endotracheal anesthesia with preoperative interscalene block given by the attending anesthesiologist   Procedure Detail   Estimated Blood Loss: Min         Drains: none  Blood Given: none         Specimens: none        Complications:  * No complications entered in OR log *         Disposition: PACU - hemodynamically stable.         Condition: stable    Procedure:   INDICATIONS FOR SURGERY: The patient is 54 y.o. female who has had a long history of left shoulder pain which has failed conservative management. Indicated for surgery to try and decrease pain and restore function.  OPERATIVE FINDINGS: Examination under anesthesia: Mild stiffness.   DESCRIPTION OF PROCEDURE: The patient was identified in preoperative  holding area where I personally marked the operative site after  verifying site, side, and procedure with the patient. An interscalene block was given by the attending anesthesiologist the holding area.  The patient was taken back to the operating room where general anesthesia was induced without complication and was placed in the beach-chair position with the back  elevated about 60 degrees and all extremities and head and neck carefully padded and  positioned.   The left upper extremity was then prepped and  draped in a standard sterile fashion. The appropriate time-out  procedure  was carried out. The patient did receive IV antibiotics  within 30 minutes of incision.   A small posterior portal incision was made and the arthroscope was introduced into the joint. An anterior portal was then established above the subscapularis using needle localization. Small cannula was placed anteriorly. Diagnostic arthroscopy was then carried out.  She was noted to have glenohumeral osteoarthritis with a small area of grade 4 exposed bone in the central inferior glenoid. The anterior-inferior labrum was torn and extensively debrided back to healthy labrum. The cartilage flaps were debrided back to stable surface. The humeral head was largely intact although there was an area anterior superior and the humeral head with a small area of grade 3 chondromalacia. This was also debrided. The biceps tendon was pulled into the joint had extensive tearing involving more than 50% of the thickness of the proximal long head. Therefore a large biter was used to perform an arthroscopic biceps tenotomy releasing the biceps from the superior labrum which was then debrided. The undersurface of the rotator cuff was noted to have partial-thickness tearing involving about 30% of the thickness of the tendon anteriorly. This was debrided back to healthy bleeding tendon. Rotator interval was released with the ArthroCare given her preoperative stiffness. There is a femoral synovitis in the joint.  The arthroscope was then introduced into the subacromial space a standard lateral portal was established with needle localization. The shaver was used through the lateral portal to perform extensive bursectomy.  The bursal surface the rotator cuff was carefully examined and found to be intact.  The coracoacromial ligament was taken down off the anterior acromion with the ArthroCare exposing a small hooked anterior acromial spur. A high-speed bur was then used through the lateral portal to take down the anterior acromial spur from  lateral to medial in a standard acromioplasty.  The acromioplasty was also viewed from the lateral portal and the bur was used as necessary to ensure that the acromion was completely flat from posterior to anterior.  The arthroscopic equipment was removed from the joint and the portals were closed with 3-0 nylon in an interrupted fashion. Sterile dressings were then applied including Xeroform 4 x 4's ABDs and tape. The patient was then allowed to awaken from general anesthesia, placed in a sling, transferred to the stretcher and taken to the recovery room in stable condition.   POSTOPERATIVE PLAN: The patient will be discharged home today and will followup in one week for suture removal and wound check.  She will get into therapy right away.

## 2015-07-20 NOTE — Anesthesia Preprocedure Evaluation (Signed)
Anesthesia Evaluation  Patient identified by MRN, date of birth, ID band Patient awake    Reviewed: Allergy & Precautions, NPO status , Patient's Chart, lab work & pertinent test results  Airway Mallampati: II  TM Distance: >3 FB Neck ROM: Full    Dental no notable dental hx.    Pulmonary neg pulmonary ROS, former smoker,    Pulmonary exam normal breath sounds clear to auscultation       Cardiovascular hypertension, Pt. on medications + Peripheral Vascular Disease  Normal cardiovascular exam Rhythm:Regular Rate:Normal     Neuro/Psych  Headaches, PSYCHIATRIC DISORDERS Depression negative neurological ROS     GI/Hepatic Neg liver ROS, GERD  Medicated,  Endo/Other  diabetes, Type 2Hypothyroidism Morbid obesity  Renal/GU negative Renal ROS     Musculoskeletal  (+) Arthritis , Fibromyalgia -  Abdominal (+) + obese,   Peds  Hematology negative hematology ROS (+)   Anesthesia Other Findings   Reproductive/Obstetrics negative OB ROS                             Anesthesia Physical Anesthesia Plan  ASA: III  Anesthesia Plan: General and Regional   Post-op Pain Management:    Induction: Intravenous  Airway Management Planned: Oral ETT  Additional Equipment: None  Intra-op Plan:   Post-operative Plan: Extubation in OR  Informed Consent: I have reviewed the patients History and Physical, chart, labs and discussed the procedure including the risks, benefits and alternatives for the proposed anesthesia with the patient or authorized representative who has indicated his/her understanding and acceptance.   Dental advisory given  Plan Discussed with: CRNA  Anesthesia Plan Comments:         Anesthesia Quick Evaluation

## 2015-07-20 NOTE — H&P (Signed)
Bridget [name] is an 54 y.o. female.   Chief Complaint: L shoulder pain and dysfunction HPI: L shoulder pain with partial thickness RCT, labral tear, OA, failed conservative management.  Past Medical History  Diagnosis Date  . Diabetes mellitus without complication (HCC)   . Osteoarthritis   . GERD (gastroesophageal reflux disease)   . Diverticulosis   . Glaucoma   . Obesity   . Vaginal yeast infection 12/08/2010  . SHOULDER PAIN, LEFT 05/19/2010    Qualifier: Diagnosis of  By: Clotilde Dieter MD, Amber    . Retained tampon 05/21/2012  . OSTEOARTHRITIS, KNEE 04/27/2010  . OBESITY, NOS 10/26/2006  . MALAISE AND FATIGUE 05/27/2010  . HEEL SPUR 09/10/2007    Qualifier: Diagnosis of  By: Lanier Prude  MD, Cathrine Muster    . DIVERTICULOSIS, COLON 10/26/2006  . Bacterial vaginosis 12/07/2010  . Hypertension   . Depression   . Headache   . Fibromyalgia     Past Surgical History  Procedure Laterality Date  . Abdominal hysterectomy  1990  . Bladder surgery  2001  . Cataract extraction Right 2001    with implant  . Abdominal hysterectomy    . Eye surgery      implant rt eye  . Rotator cuff repair Right     Family History  Problem Relation Age of Onset  . Breast cancer Mother   . Ovarian cancer Maternal Grandmother   . Stomach cancer Cousin   . Diabetes Mother   . Heart disease Maternal Grandmother     great  . Diabetes Brother     x 3   Social History:  reports that she quit smoking about 16 years ago. Her smoking use included Cigarettes. She smoked 0.30 packs per day. She has never used smokeless tobacco. She reports that she drinks alcohol. She reports that she does not use illicit drugs.  Allergies:  Allergies  Allergen Reactions  . Citrus Itching  . Codeine Hives  . Metformin And Related Diarrhea and Nausea And Vomiting  . Metoclopramide Hcl Nausea And Vomiting  . Penicillins Nausea And Vomiting  . Propoxyphene N-Acetaminophen Nausea And Vomiting  . Sulfonamide Derivatives Hives     Medications Prior to Admission  Medication Sig Dispense Refill  . amitriptyline (ELAVIL) 25 MG tablet Take 0.5 tablets (12.5 mg total) by mouth at bedtime. 30 tablet 2  . aspirin 81 MG tablet Take 1 tablet (81 mg total) by mouth daily.    Marland Kitchen atorvastatin (LIPITOR) 40 MG tablet Take 1 tablet (40 mg total) by mouth daily. 90 tablet 3  . cetirizine (ZYRTEC) 10 MG tablet Take 1 tablet (10 mg total) by mouth daily. 30 tablet 11  . colchicine 0.6 MG tablet Take 0.6 mg by mouth daily.    . Cranberry-Vitamin C-Probiotic (AZO CRANBERRY PO) Take by mouth daily.    . cyclobenzaprine (FLEXERIL) 5 MG tablet Take 1 tablet (5 mg total) by mouth 3 (three) times daily as needed for muscle spasms. 90 tablet 1  . glimepiride (AMARYL) 4 MG tablet TAKE 1 TABLET (4 MG TOTAL) BY MOUTH DAILY WITH BREAKFAST. 90 tablet 3  . INVOKANA 300 MG TABS tablet TAKE 1 TABLET BY MOUTH DAILY. 90 tablet 3  . lisinopril (PRINIVIL,ZESTRIL) 5 MG tablet Take 1 tablet (5 mg total) by mouth daily. 30 tablet 5  . pantoprazole (PROTONIX) 20 MG tablet Take 1 tablet (20 mg total) by mouth daily. 30 tablet 6  . pregabalin (LYRICA) 150 MG capsule Take 1 capsule (150 mg total)  by mouth 2 (two) times daily. 60 capsule 2  . sitaGLIPtin (JANUVIA) 100 MG tablet Take 1 tablet (100 mg total) by mouth daily. 90 tablet 3  . traMADol (ULTRAM) 50 MG tablet Take 1 tablet (50 mg total) by mouth every 6 (six) hours as needed. 10 tablet 0  . triamcinolone (NASACORT AQ) 55 MCG/ACT AERO nasal inhaler Place 2 sprays into the nose daily. 1 Inhaler 12  . Alcohol Swabs (ALCOHOL PREPS) PADS 1 Device by Does not apply route 4 (four) times daily -  before meals and at bedtime. 100 each 11  . ondansetron (ZOFRAN ODT) 8 MG disintegrating tablet Take 1 tablet (8 mg total) by mouth every 8 (eight) hours as needed for nausea or vomiting. (Patient not taking: Reported on 03/09/2015) 20 tablet 0  . TRUETEST TEST test strip USE AS INSTRUCTED 100 each 11    Results for  orders placed or performed during the hospital encounter of 07/20/15 (from the past 48 hour(s))  Glucose, capillary     Status: Abnormal   Collection Time: 07/20/15 10:27 AM  Result Value Ref Range   Glucose-Capillary 166 (H) 65 - 99 mg/dL   No results found.  Review of Systems  All other systems reviewed and are negative.   Blood pressure 126/60, pulse 82, temperature 98.5 F (36.9 C), temperature source Oral, resp. rate 20, height 5\' 5"  (1.651 m), weight 106.595 kg (235 lb), SpO2 98 %. Physical Exam  Constitutional: She is oriented to person, place, and time. She appears well-developed and well-nourished.  HENT:  Head: Atraumatic.  Eyes: EOM are normal.  Cardiovascular: Intact distal pulses.   Respiratory: Effort normal.  Musculoskeletal:  L shoulder pain with motion, NVID  Neurological: She is alert and oriented to person, place, and time.  Skin: Skin is warm and dry.  Psychiatric: She has a normal mood and affect.     Assessment/Plan L shoulder partial thickness RCT, labral tear, OA, failed conservative management. Plan arth L debridement, SAD Risks / benefits of surgery discussed Consent on chart  NPO for OR Preop antibiotics   Bridget [name] 07/20/2015, 11:33 AM

## 2015-07-20 NOTE — Discharge Instructions (Signed)
Discharge Instructions after Arthroscopic Shoulder Surgery ° ° °A sling has been provided for you. You may remove the sling after 72 hours. The sling may be worn for your protection, if you are in a crowd.  °Use ice on the shoulder intermittently over the first 48 hours after surgery.  °Pain medication has been prescribed for you.  °Use your medication liberally over the first 48 hours, and then begin to taper your use. You may take Extra Strength Tylenol or Tylenol only in place of the pain pills. DO NOT take ANY nonsteroidal anti-inflammatory pain medications: Advil, Motrin, Ibuprofen, Aleve, Naproxen, or Naprosyn.  °You may remove your dressing after two days.  °You may shower 5 days after surgery. The incision CANNOT get wet prior to 5 days. Simply allow the water to wash over the site and then pat dry. Do not rub the incision. Make sure your axilla (armpit) is completely dry after showering.  °Take one aspirin a day for 2 weeks after surgery, unless you have an aspirin sensitivity/allergy or asthma.  °Three to 5 times each day you should perform assisted overhead reaching and external rotation (outward turning) exercises with the operative arm. Both exercises should be done with the non-operative arm used as the "therapist arm" while the operative arm remains relaxed. Ten of each exercise should be done three to five times each day. ° ° ° °Overhead reach is helping to lift your stiff arm up as high as it will go. To stretch your overhead reach, lie flat on your back, relax, and grasp the wrist of the tight shoulder with your opposite hand. Using the power in your opposite arm, bring the stiff arm up as far as it is comfortable. Start holding it for ten seconds and then work up to where you can hold it for a count of 30. Breathe slowly and deeply while the arm is moved. Repeat this stretch ten times, trying to help the arm up a little higher each time.  ° ° ° ° ° °External rotation is turning the arm out to  the side while your elbow stays close to your body. External rotation is best stretched while you are lying on your back. Hold a cane, yardstick, broom handle, or dowel in both hands. Bend both elbows to a right angle. Use steady, gentle force from your normal arm to rotate the hand of the stiff shoulder out away from your body. Continue the rotation as far as it will go comfortably, holding it there for a count of 10. Repeat this exercise ten times.  ° ° ° °Please call [phone] during normal business hours or [phone] after hours for any problems. Including the following: ° °- excessive redness of the incisions °- drainage for more than 4 days °- fever of more than 101.5 F ° °*Please note that pain medications will not be refilled after hours or on weekends. ° ° °Regional Anesthesia Blocks ° °1. Numbness or the inability to move the "blocked" extremity may last from 3-48 hours after placement. The length of time depends on the medication injected and your individual response to the medication. If the numbness is not going away after 48 hours, call your surgeon. ° °2. The extremity that is blocked will need to be protected until the numbness is gone and the  Strength has returned. Because you cannot feel it, you will need to take extra care to avoid injury. Because it may be weak, you may have difficulty moving it or using   it. You may not know what position it is in without looking at it while the block is in effect. ° °3. For blocks in the legs and feet, returning to weight bearing and walking needs to be done carefully. You will need to wait until the numbness is entirely gone and the strength has returned. You should be able to move your leg and foot normally before you try and bear weight or walk. You will need someone to be with you when you first try to ensure you do not fall and possibly risk injury. ° °4. Bruising and tenderness at the needle site are common side effects and will resolve in a few  days. ° °5. Persistent numbness or new problems with movement should be communicated to the surgeon or the [facility] ([phone])/ [facility] ([phone]). ° ° °Post Anesthesia Home Care Instructions ° °Activity: °Get plenty of rest for the remainder of the day. A responsible adult should stay with you for 24 hours following the procedure.  °For the next 24 hours, DO NOT: °-Drive a car °-Operate machinery °-Drink alcoholic beverages °-Take any medication unless instructed by your physician °-Make any legal decisions or sign important papers. ° °Meals: °Start with liquid foods such as gelatin or soup. Progress to regular foods as tolerated. Avoid greasy, spicy, heavy foods. If nausea and/or vomiting occur, drink only clear liquids until the nausea and/or vomiting subsides. Call your physician if vomiting continues. ° °Special Instructions/Symptoms: °Your throat may feel dry or sore from the anesthesia or the breathing tube placed in your throat during surgery. If this causes discomfort, gargle with warm salt water. The discomfort should disappear within 24 hours. ° °If you had a scopolamine patch placed behind your ear for the management of post- operative nausea and/or vomiting: ° °1. The medication in the patch is effective for 72 hours, after which it should be removed.  Wrap patch in a tissue and discard in the trash. Wash hands thoroughly with soap and water. °2. You may remove the patch earlier than 72 hours if you experience unpleasant side effects which may include dry mouth, dizziness or visual disturbances. °3. Avoid touching the patch. Wash your hands with soap and water after contact with the patch. °  ° °

## 2015-07-21 ENCOUNTER — Encounter (HOSPITAL_BASED_OUTPATIENT_CLINIC_OR_DEPARTMENT_OTHER): Payer: Self-pay | Admitting: Orthopedic Surgery

## 2015-07-21 ENCOUNTER — Ambulatory Visit: Payer: 59 | Admitting: *Deleted

## 2015-07-22 ENCOUNTER — Other Ambulatory Visit: Payer: Self-pay | Admitting: Internal Medicine

## 2015-07-22 ENCOUNTER — Other Ambulatory Visit: Payer: Self-pay | Admitting: *Deleted

## 2015-07-22 NOTE — Patient Outreach (Signed)
Transition of care outreach call to Bridget [name] to assess her recovery from left shoulder arthroscopy and repair of labrum and rotator cuff tear done on 07/20/15. Left message on her mobile number requesting she return call to this RN CM or will make second outreach attempt on 11/25 when office opens after the 11/24 Thanksgiving Holiday. ,Bary RichardJanet S. Hauser RN,CCM,CDE [facility] Care Management Coordinator [facility] Office Phone [phone] Office Fax [phone]

## 2015-07-24 ENCOUNTER — Other Ambulatory Visit: Payer: Self-pay | Admitting: *Deleted

## 2015-07-24 NOTE — Patient Outreach (Signed)
Spoke with Bridget [name] via her mobile phone. She states her recovery is going well. She says the pain medicine is keeping her pain in control and that she is resting well at night. She says her throat is sore from the endotracheal tube but that gargling with salt water is helping. She is not hoarse.  She says she still; is bothered by her baseline joint pain and swelling and that the colchicine that Dr. Corliss Skainseveshwar ordered for her is not helping. Says she will see her surgeon on Monday 11/28.  She says she will be out of work for 6 weeks minimum. This RNCM will call her again next week to assess her ongoing recovery from surgery. Bary RichardJanet S. Kelvin Sennett RN,CCM,CDE [facility] Care Management Coordinator [facility] Office Phone [phone] Office Fax [phone]

## 2015-07-28 ENCOUNTER — Other Ambulatory Visit: Payer: Self-pay | Admitting: *Deleted

## 2015-07-28 NOTE — Patient Outreach (Signed)
Transition of care call completed. Lyliana continues to recover well from left shoulder arthroscopy surgery. She saw her surgeon yesterday and had the sutures removed. She said the stab would incisions look good, she is not running a fever, her pain is well controlled with Percocet, she is voiding and moving her bowels without problems[. She is taking a stool softener to prevent constipation and straining with bowel movements. She continues to do her home exercises and says she has not started Op therapy because she cannot drive while taking Percocet and she cannot afford the copay. This RNCM reviewed with her the benefit of receiving outpatient  PT at a [place] facility to reduce her out of pocket costs. Will call Ayahna again next week to assess her recovery. Bary RichardJanet S. Keyonda Bickle RN,CCM,CDE [facility] Care Management Coordinator [facility] Office Phone [phone] Office Fax [phone]

## 2015-08-05 ENCOUNTER — Ambulatory Visit: Payer: 59 | Attending: Surgical | Admitting: Physical Therapy

## 2015-08-05 DIAGNOSIS — M25612 Stiffness of left shoulder, not elsewhere classified: Secondary | ICD-10-CM | POA: Diagnosis present

## 2015-08-05 DIAGNOSIS — R29898 Other symptoms and signs involving the musculoskeletal system: Secondary | ICD-10-CM | POA: Diagnosis present

## 2015-08-05 DIAGNOSIS — M25512 Pain in left shoulder: Secondary | ICD-10-CM | POA: Diagnosis not present

## 2015-08-05 NOTE — Patient Instructions (Signed)
    SHOULDER: External Rotation - Supine (Cane)   Hold cane with both hands. Rotate arm away from body. Keep elbow on floor and next to body. 10___ reps per set, _3__ sets per day, ___7 days per week Add towel to keep elbow at side.  Copyright  VHI. All rights reserved.  Cane Horizontal - Supine   With straight arms holding cane above shoulders, bring cane out to right, center, out to left, and back to above head. Repeat __10_ times. Do __3_ times per day.  Copyright  VHI. All rights reserved.  Cane Exercise: Flexion   Lie on back, holding cane above chest. Keeping arms as straight as possible, lower cane toward floor beyond head. Hold _3___ seconds. Repeat ___15_ times. Do __3__ sessions per day.  [email or web address].exer.[email or web address]   Copyright  VHI. All rights reserved.

## 2015-08-06 NOTE — Therapy (Signed)
[facility] [address] [place], [place], [address] Phone: [phone]   Fax:  [phone]  Physical Therapy Evaluation  Patient Details  Name: Bridget [name] MRN: [id number] Date of Birth: [birth year] Referring Provider: Jiles Harold PA  Encounter Date: 08/05/2015      PT End of Session - 08/06/15 0943    Visit Number 1   Number of Visits 16   Date for PT Re-Evaluation 09/30/15   Authorization Type UMR   PT Start Time 1015   PT Stop Time 1115   PT Time Calculation (min) 60 min   Activity Tolerance Patient limited by pain      Past Medical History  Diagnosis Date  . Diabetes mellitus without complication (HCC)   . Osteoarthritis   . GERD (gastroesophageal reflux disease)   . Diverticulosis   . Glaucoma   . Obesity   . Vaginal yeast infection 12/08/2010  . SHOULDER PAIN, LEFT 05/19/2010    Qualifier: Diagnosis of  By: Clotilde Dieter MD, Amber    . Retained tampon 05/21/2012  . OSTEOARTHRITIS, KNEE 04/27/2010  . OBESITY, NOS 10/26/2006  . MALAISE AND FATIGUE 05/27/2010  . HEEL SPUR 09/10/2007    Qualifier: Diagnosis of  By: Lanier Prude  MD, Cathrine Muster    . DIVERTICULOSIS, COLON 10/26/2006  . Bacterial vaginosis 12/07/2010  . Hypertension   . Depression   . Headache   . Fibromyalgia     Past Surgical History  Procedure Laterality Date  . Abdominal hysterectomy  1990  . Bladder surgery  2001  . Cataract extraction Right 2001    with implant  . Abdominal hysterectomy    . Eye surgery      implant rt eye  . Rotator cuff repair Right   . Shoulder arthroscopy with subacromial decompression Left 07/20/2015    Procedure: SHOULDER ARTHROSCOPY WITH DEBRIDEMENT OF ROTATOR CUFF AND SUBACROMIAL DECOMPRESSION ;  Surgeon: Jones Broom, MD;  Location: [facility];  Service: Orthopedics;  Laterality: Left;  Left shoulder arthroscopy with debridement of rotator cuff and subacromial decompression    There were no  vitals filed for this visit.  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 Yes   Pain Score 7    Pain Location Shoulder   Pain Orientation Left   Pain Descriptors / Indicators Spasm   Pain Type Surgical pain   Aggravating Factors  reaching overhead, behind back    Pain Relieving Factors pain medicine            [facility] PT Assessment - 08/05/15 1032    Assessment   Medical Diagnosis partial rotator cuff tear, debridement   Referring Provider Jiles Harold PA   Onset Date/Surgical Date 07/20/15   Hand Dominance Right   Next MD Visit Dr. Ave Filter  12/19   Prior Therapy Right shoulder surgery '99   Precautions   Precautions None   Restrictions   Weight Bearing Restrictions No   Balance Screen   Has the patient fallen in the past 6 months No   Has the patient had a decrease in activity level because of a fear of falling?  No   Is the patient reluctant to leave their home because of a fear of falling?  No   Home [employer] information [employer] managernvironment   Living Environment Private residence   Living Arrangements Children   Prior Function   Level of Independence Independent   Vocation Full time employment   Vocation Requirements hospital switchboard reaching, lift binders   Leisure go to the beach   Observation/Other Assessments   Focus on  Therapeutic Outcomes (FOTO)  67% limitation   Posture/Postural Control   Posture/Postural Control --  normal skin temp, no visible swelling, no redness   Posture Comments left shoulder shrugged  incision portals appear healed no drainage   AROM   AROM Assessment Site Shoulder   Right/Left Shoulder Right;Left   Right Shoulder Flexion 165 Degrees   Right Shoulder ABduction 150 Degrees   Right Shoulder Internal Rotation --  T 10   Right Shoulder External Rotation 78 Degrees   Left Shoulder Flexion 64 Degrees   Left Shoulder ABduction 46 Degrees   Left Shoulder Internal Rotation --  Left buttock   Left Shoulder External Rotation 30 Degrees   Strength   Left Shoulder Flexion 2+/5   Left Shoulder Extension 2+/5   Left Shoulder ABduction 2+/5   Left Shoulder Internal Rotation 2+/5   Left Shoulder External Rotation 2+/5                   [facility] Adult PT Treatment/Exercise - 08/05/15 1032    Cryotherapy   Number Minutes Cryotherapy 15 Minutes   Cryotherapy Location Shoulder   Type of Cryotherapy Ice pack   Electrical Stimulation   Electrical Stimulation Location left shoulder   Electrical Stimulation Action IFC   Electrical Stimulation Parameters 5 ma   Electrical Stimulation Goals Pain                PT Education - 08/06/15 [phone]    Education provided Yes   Education Details supine cane exercises   Person(s) Educated Patient   Methods Explanation;Demonstration;Handout   Comprehension Verbalized understanding;Returned demonstration          PT Short Term Goals - 08/06/15 0952    PT SHORT TERM GOAL #1   Title Patient will demonstrate basic self care for AAROM/AROM, ice for pain control   09/02/15   Time 4   Period Weeks   Status New   PT SHORT TERM GOAL #2   Title The patient will have 90 degrees of shoulder flexion/elevation needed for self care grooming, dressing tasks and driving   Time 4   Period Weeks   Status New   PT SHORT TERM GOAL #3    Title The patient will have improved ER ROM to 55 degrees needed for turning the steering wheel.   Time 4   Period Weeks   Status New   PT SHORT TERM GOAL #4   Title Pain intensity improved by 25% with usual ADLs   Time 4   Period Weeks   Status New           PT Long Term Goals - 08/06/15 0959    PT LONG TERM GOAL #1  Title The patient will be independent with safe self progression of HEP for further ROM and strengthening   09/30/15   Time 8   Period Weeks   Status New   PT LONG TERM GOAL #2   Title The patient will have 125 degrees of left shoulder flexion/elevation needed for reaching the switchboard at work Orthoptist-  code buttons)   Time 8   Period Weeks   Status New   PT LONG TERM GOAL #3   Title The patient will have grossly 4/5 left glenohumeral and scapular strength needed for lifting code binders at work   Time 8   Period Weeks   Status New   PT LONG TERM GOAL #4   Title The patient will have improved IR and ER to 60 degrees needed for household chores and driving   Time 8   Period Weeks   Status New   PT LONG TERM GOAL #5   Title FOTO functional outcome score improved to 38% indicating improved function with less pain   Time 8   Period Weeks   Status New               Plan - 08/06/15 0944    Clinical Impression Statement No specific injury she wonders if it was from walking her  dogs or 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  Unable to work.  Her shoulder AROM is very limited and painful, poor strength.  Upper trap compensatory shoulder shrug is evident.  She would benefit from PT to address these deficits.     Pt will benefit from skilled therapeutic intervention in order to  improve on the following deficits Decreased activity tolerance;Decreased range of motion;Decreased strength;Impaired UE functional use;Pain;Increased muscle spasms   Rehab Potential Good   Clinical Impairments Affecting Rehab Potential fibromyalgia;  previous history of right shoulder surgery in '99;  Left shoulder scope/debridement 07/20/15   PT Frequency 2x / week   PT Duration 8 weeks   PT Treatment/Interventions ADLs/Self Care Home Management;Cryotherapy;Electrical Stimulation;Moist Heat;Ultrasound;Therapeutic exercise;Therapeutic activities;Patient/family education;Manual techniques;Taping;Vasopneumatic Device   PT Next Visit Plan Left shoulder AAROM, AROM; GH joint mobs; isometrics; IFC/cold for pain control   PT Home Exercise Plan supine cane exercises         Problem List Patient Active Problem List   Diagnosis Date Noted  . GERD (gastroesophageal reflux disease) 09/09/2014  . Other and unspecified hyperlipidemia 05/24/2013  . Lung nodule seen on imaging study 09/21/2012  . Chronic pain of multiple sites - Knees, Ankles, Back 11/09/2011  . Lupus (systemic lupus erythematosus) (HCC) 06/28/2011  . HYPOTHYROIDISM 04/27/2010  . Depression 04/27/2010  . OSTEOARTHRITIS, KNEE 04/27/2010  . Diabetes mellitus type 2, uncontrolled, with complications (HCC) 08/14/2007  . Obesity 10/26/2006  . HYPERTENSION, BENIGN SYSTEMIC 10/26/2006  . VENOUS INSUFFICIENCY, CHRONIC 10/26/2006    Vivien Presto 08/06/2015, 10:10 AM  [facility] [address] [place], [place], [address] Phone: [phone]   Fax:  [phone]  Name: Bridget [name] MRN: [id number] Date of Birth: Mar 21, 1961   Lavinia Sharps, PT 08/06/2015 10:11 AM Phone: [phone] Fax: [phone]

## 2015-08-13 ENCOUNTER — Other Ambulatory Visit: Payer: Self-pay | Admitting: *Deleted

## 2015-08-13 NOTE — Patient Outreach (Signed)
Transition of care outreach. Left message for Sana on her mobile number requesting update on her recovery from arthroscopic left shoulder surgery on 07/20/15. Await return call. Bary RichardJanet S. Felipa Laroche RN,CCM,CDE [facility] Care Management Coordinator [facility] Office Phone [phone] Office Fax [phone]

## 2015-08-14 ENCOUNTER — Ambulatory Visit: Payer: 59 | Admitting: Physical Therapy

## 2015-08-14 DIAGNOSIS — M25512 Pain in left shoulder: Secondary | ICD-10-CM | POA: Diagnosis not present

## 2015-08-14 DIAGNOSIS — M25612 Stiffness of left shoulder, not elsewhere classified: Secondary | ICD-10-CM

## 2015-08-14 DIAGNOSIS — R29898 Other symptoms and signs involving the musculoskeletal system: Secondary | ICD-10-CM

## 2015-08-14 NOTE — Therapy (Signed)
[facility] [address] [place], [place], [address] Phone: [phone]   Fax:  [phone]  Physical Therapy Treatment  Patient Details  Name: Bridget [name] MRN: [id number] Date of Birth: May 29, 1961 Referring Provider: Jiles Haroldanielle Laliberte PA  Encounter Date: 08/14/2015      PT End of Session - 08/14/15 1103    Visit Number 2   Number of Visits 16   Date for PT Re-Evaluation 09/30/15   Authorization Type UMR   PT Start Time 1021   PT Stop Time 1120   PT Time Calculation (min) 59 min   Activity Tolerance Patient limited by pain      Past Medical History  Diagnosis Date  . Diabetes mellitus without complication (HCC)   . Osteoarthritis   . GERD (gastroesophageal reflux disease)   . Diverticulosis   . Glaucoma   . Obesity   . Vaginal yeast infection 12/08/2010  . SHOULDER PAIN, LEFT 05/19/2010    Qualifier: Diagnosis of  By: Clotilde DieterStrother MD, Amber    . Retained tampon 05/21/2012  . OSTEOARTHRITIS, KNEE 04/27/2010  . OBESITY, NOS 10/26/2006  . MALAISE AND FATIGUE 05/27/2010  . HEEL SPUR 09/10/2007    Qualifier: Diagnosis of  By: Lanier PrudeBolden  MD, Cathrine Musteraineisha    . DIVERTICULOSIS, COLON 10/26/2006  . Bacterial vaginosis 12/07/2010  . Hypertension   . Depression   . Headache   . Fibromyalgia     Past Surgical History  Procedure Laterality Date  . Abdominal hysterectomy  1990  . Bladder surgery  2001  . Cataract extraction Right 2001    with implant  . Abdominal hysterectomy    . Eye surgery      implant rt eye  . Rotator cuff repair Right   . Shoulder arthroscopy with subacromial decompression Left 07/20/2015    Procedure: SHOULDER ARTHROSCOPY WITH DEBRIDEMENT OF ROTATOR CUFF AND SUBACROMIAL DECOMPRESSION ;  Surgeon: Jones BroomJustin Chandler, MD;  Location: [facility];  Service: Orthopedics;  Laterality: Left;  Left shoulder arthroscopy with debridement of rotator cuff and subacromial decompression    There were no  vitals filed for this visit.  Visit Diagnosis:  Pain in joint of left shoulder  Shoulder stiffness, left  Shoulder weakness      Subjective Assessment - 08/14/15 1024    Subjective Arrives 6 min late.  My shoulder is throbbing 8/10.  Didn't take pain meds b/c driving.     Currently in Pain? Yes   Pain Score 8    Pain Location Shoulder   Pain Orientation Left   Pain Type Surgical pain                         [facility] Adult PT Treatment/Exercise - 08/14/15 0001    Shoulder Exercises: Supine   Protraction AROM;Left;10 reps   Flexion AAROM;Left;5 reps  UE Ranger   Other Supine Exercises rhythmic stab at 90 degrees 2x   Shoulder Exercises: Seated   Retraction AROM;Both;10 reps   Other Seated Exercises UE Ranger 10x   Shoulder Exercises: Standing   Other Standing Exercises UE Ranger on floor, on step stool and table 10x each   Shoulder Exercises: Isometric Strengthening   Extension 5X5"   External Rotation 5X5"   Internal Rotation 5X5"   ABduction 5X5"   Modalities   Modalities Electrical Stimulation   Electrical Stimulation   Electrical Stimulation Location left shoulder  worn during ex   Electrical Stimulation Action TENS  Electrical Stimulation Parameters to tolerance   Electrical Stimulation Goals Pain                PT Education - 08/14/15 1102    Education provided Yes   Education Details shoulder isometrics   Person(s) Educated Patient   Methods Explanation;Demonstration;Handout   Comprehension Verbalized understanding;Returned demonstration          PT Short Term Goals - 08/14/15 1113    PT SHORT TERM GOAL #1   Title Patient will demonstrate basic self care for AAROM/AROM, ice for pain control   09/02/15   Time 4   Period Weeks   Status On-going   PT SHORT TERM GOAL #2   Title The patient will have 90 degrees of shoulder flexion/elevation needed for self care grooming, dressing tasks and driving   Time 4   Period Weeks   Status  On-going   PT SHORT TERM GOAL #3   Title The patient will have improved ER ROM to 55 degrees needed for turning the steering wheel.   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)   Time 8   Period Weeks   Status On-going   PT LONG TERM GOAL #3   Title The patient will have grossly 4/5 left glenohumeral and scapular strength needed for lifting code binders at work   Time 8   Period Weeks   Status On-going   PT LONG TERM GOAL #4   Title The patient will have improved IR and ER to 60 degrees needed for household chores and driving   Time 8   Period Weeks   Status On-going   PT LONG TERM GOAL #5   Title FOTO functional outcome score improved to 38% indicating improved function with less pain   Time 8   Period Weeks   Status On-going               Plan - 08/14/15 1104    Clinical Impression Statement The patient reports continued left shoulder pain and difficulty using her arm to drive.  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  She states she has been doing her cane exercises at home.  She brings in her TENS unit that she used to use a long time ago for another problem and would like instruction on appropriate settings and application to use on her shoulder.  She wears the TENS during ex today in the clinic but still moderately  painful.  MD on Monday   PT Next Visit Plan See how MD appt went;   Left shoulder AAROM, AROM; GH joint mobs, start yellow band as tolerated  Problem List Patient Active Problem List   Diagnosis Date Noted  . GERD (gastroesophageal reflux disease) 09/09/2014  . Other and unspecified hyperlipidemia 05/24/2013  . Lung nodule seen on imaging study 09/21/2012  . Chronic pain of multiple sites - Knees, Ankles, Back 11/09/2011  . Lupus (systemic lupus erythematosus) (HCC) 06/28/2011  . HYPOTHYROIDISM 04/27/2010  . Depression 04/27/2010  . OSTEOARTHRITIS, KNEE 04/27/2010  . Diabetes mellitus type 2, uncontrolled, with complications (HCC) 08/14/2007  . Obesity 10/26/2006  . HYPERTENSION, BENIGN SYSTEMIC 10/26/2006  . VENOUS INSUFFICIENCY, CHRONIC 10/26/2006    Bridget [name] 08/14/2015, 11:27 AM  [facility] [address] [place], [place], [address] Phone: [phone]   Fax:  [phone]  Name: Bridget [name] MRN: [id number] Date of Birth: 1961-06-17    Lavinia Sharps, PT 08/14/2015 11:27 AM Phone: [phone] Fax: [phone]

## 2015-08-14 NOTE — Patient Instructions (Signed)
  SHOULDER: Abduction (Isometric)  Use wall as resistance. Press arm against pillow. Keep elbow straight. Hold _5__ seconds. __5_ reps per set, _5__ sets per day, _7__ days per week  Extension (Isometric)  Place left bent elbow and back of arm against wall. Press elbow against wall. Hold __5__ seconds. Repeat __5__ times. Do ___5_ sessions per day.  Internal Rotation (Isometric)  Place palm of right fist against door frame, with elbow bent. Press fist against door frame. Hold ___5_ seconds. Repeat ___5_ times. Do ___5_ sessions per day.  External Rotation (Isometric)  Place back of left fist against door frame, with elbow bent. Press fist against door frame. Hold _5___ seconds. Repeat ___5_ times. Do ___5_ sessions per day.  Copyright  VHI. All rights reserved.

## 2015-08-18 ENCOUNTER — Ambulatory Visit: Payer: 59 | Admitting: Physical Therapy

## 2015-08-18 DIAGNOSIS — R29898 Other symptoms and signs involving the musculoskeletal system: Secondary | ICD-10-CM

## 2015-08-18 DIAGNOSIS — M25512 Pain in left shoulder: Secondary | ICD-10-CM | POA: Diagnosis not present

## 2015-08-18 DIAGNOSIS — M25612 Stiffness of left shoulder, not elsewhere classified: Secondary | ICD-10-CM

## 2015-08-18 NOTE — Patient Instructions (Signed)
Strengthening: Resisted Internal Rotation   Hold tubing in left hand, elbow at side and forearm out. Rotate forearm in across body. Repeat 15___ times per set. Do __1__ sets per session. Do ___1_ sessions per day.  http://orth.exer.us/830   Copyright  VHI. All rights reserved.  Strengthening: Resisted External Rotation   Hold tubing in right hand, elbow at side and forearm across body. Rotate forearm out. Repeat _15___ times per set. Do __1__ sets per session. Do _1__ sessions per day.  http://orth.exer.us/828   Copyright  VHI. All rights reserved.  Strengthening: Resisted Flexion   Hold tubing with left arm at side. Pull forward and up. Move shoulder through pain-free range of motion. Repeat __15_ times per set. Do __1__ sets per session. Do ___1_ sessions per day.  http://orth.exer.us/824   Copyright  VHI. All rights reserved.  Strengthening: Resisted Extension   Hold tubing in right hand, arm forward. Pull arm back, elbow straight. Repeat 15___ times per set. Do __1__ sets per session. Do __1__ sessions per day.  http://orth.exer.us/832   Copyright  VHI. All rights reserved.   

## 2015-08-18 NOTE — Therapy (Signed)
[facility] [address] [place], [place], [address] Phone: [phone]   Fax:  [phone]  Physical Therapy Treatment  Patient Details  Name: Bridget [name] MRN: [id number] Date of Birth: 08/19/1961 Referring Provider: Jiles Harold PA  Encounter Date: 08/18/2015      PT End of Session - 08/18/15 1216    Visit Number 3   Number of Visits 16   Date for PT Re-Evaluation 09/30/15   Authorization Type UMR   PT Start Time 1200   PT Stop Time 1248   PT Time Calculation (min) 48 min   Activity Tolerance Patient tolerated treatment well      Past Medical History  Diagnosis Date  . Diabetes mellitus without complication (HCC)   . Osteoarthritis   . GERD (gastroesophageal reflux disease)   . Diverticulosis   . Glaucoma   . Obesity   . Vaginal yeast infection 12/08/2010  . SHOULDER PAIN, LEFT 05/19/2010    Qualifier: Diagnosis of  By: Clotilde Dieter MD, Amber    . Retained tampon 05/21/2012  . OSTEOARTHRITIS, KNEE 04/27/2010  . OBESITY, NOS 10/26/2006  . MALAISE AND FATIGUE 05/27/2010  . HEEL SPUR 09/10/2007    Qualifier: Diagnosis of  By: Lanier Prude  MD, Cathrine Muster    . DIVERTICULOSIS, COLON 10/26/2006  . Bacterial vaginosis 12/07/2010  . Hypertension   . Depression   . Headache   . Fibromyalgia     Past Surgical History  Procedure Laterality Date  . Abdominal hysterectomy  1990  . Bladder surgery  2001  . Cataract extraction Right 2001    with implant  . Abdominal hysterectomy    . Eye surgery      implant rt eye  . Rotator cuff repair Right   . Shoulder arthroscopy with subacromial decompression Left 07/20/2015    Procedure: SHOULDER ARTHROSCOPY WITH DEBRIDEMENT OF ROTATOR CUFF AND SUBACROMIAL DECOMPRESSION ;  Surgeon: Jones Broom, MD;  Location: [facility];  Service: Orthopedics;  Laterality: Left;  Left shoulder arthroscopy with debridement of rotator cuff and subacromial decompression    There  were no vitals filed for this visit.  Visit Diagnosis:  Pain in joint of left shoulder  Shoulder stiffness, left  Shoulder weakness      Subjective Assessment - 08/18/15 1200    Subjective Patient arrives 15 min late.  States she has not been using her home TENS because she doesn't have back up appointments.  Did not take pain medicinet this morning.  Saw the doctor and he gave 2 more weeks before return to work.  The PA said I was coming along but they want me to keep doing therapy.     Currently in Pain? Yes   Pain Score 6    Pain Location Shoulder   Pain Orientation Left   Pain Type Surgical pain   Pain Onset More than a month ago   Pain Frequency Constant            [facility] PT Assessment - 08/18/15 0001    AROM   Left Shoulder Flexion 125 Degrees  AAROM                     [facility] Adult PT Treatment/Exercise - 08/18/15 1206    Shoulder Exercises: Standing   Protraction Strengthening;Both;15 reps  wall push ups 15x   Horizontal ABduction Strengthening;Left;15 reps;Theraband   External Rotation Strengthening;Left;15 reps;Theraband   Theraband Level (Shoulder External Rotation) Level 1 (Yellow)   Internal  Rotation Strengthening;Left;15 reps;Theraband   Theraband Level (Shoulder Internal Rotation) Level 1 (Yellow)   Flexion Strengthening;Left;15 reps;Theraband   ABduction Strengthening;Left;15 reps;Theraband   Extension Strengthening;Left;15 reps;Theraband   Theraband Level (Shoulder Extension) Level 1 (Yellow)   Row Strengthening;Left;15 reps;Theraband   Theraband Level (Shoulder Row) Level 1 (Yellow)   Shoulder Elevation AAROM;Both;15 reps;Standing  red ball roll up wall   Other Standing Exercises UE Ranger on boxes 15x each   Other Standing Exercises UE Ranger on wall 15x  Level 9   Cryotherapy   Number Minutes Cryotherapy 10 Minutes   Cryotherapy Location Shoulder   Type of Cryotherapy Ice pack                PT Education - 08/18/15 1216     Education provided Yes   Education Details Yellow band ER,IR, Row, Extensions, [employer]) Educated Patient   Methods Explanation;Demonstration;Handout   Comprehension Verbalized understanding;Returned demonstration          PT Short Term Goals - 08/18/15 1239    PT SHORT TERM GOAL #1   Title Patient will demonstrate basic self care for AAROM/AROM, ice for pain control   09/02/15   Time 4   Period Weeks   Status On-going   PT SHORT TERM GOAL #2   Title The patient will have 90 degrees of shoulder flexion/elevation needed for self care grooming, dressing tasks and driving   Time 4   Period Weeks   Status On-going   PT SHORT TERM GOAL #3   Title The patient will have improved ER ROM to 55 degrees needed for turning the steering wheel.   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)   Time 8   Period Weeks   Status On-going   PT LONG TERM GOAL #3   Title The patient will have grossly 4/5 left glenohumeral and scapular strength needed for lifting code binders at work   Period Weeks   Status On-going   PT LONG TERM GOAL #4   Title The patient will have improved IR and ER to 60 degrees needed for household chores and driving   Time 8   Period Weeks   Status On-going   PT LONG TERM GOAL #5   Title FOTO functional outcome score improved to 38% indicating improved function with less pain   Time 8   Period Weeks   Status On-going                Plan - 08/18/15 1241    Clinical Impression Statement The patient uses her TENS unit for pain management with therapeutic exercise.  Decreased upper trap overcompensation with left UE elevation.  AAROM left shoulder elevation to 125 degrees.  Therapist closely monitoring pain response and modifying as needed.  PT Next Visit Plan assess carryover with yellow band ex;  add supine scapular band ex's;  add prone row and extension;  cold pack;  check AROM        Problem List Patient Active Problem List   Diagnosis Date Noted  . GERD (gastroesophageal reflux disease) 09/09/2014  . Other and unspecified hyperlipidemia 05/24/2013  . Lung nodule seen on imaging study 09/21/2012  . Chronic pain of multiple sites - Knees, Ankles, Back 11/09/2011  . Lupus (systemic lupus erythematosus) (HCC) 06/28/2011  . HYPOTHYROIDISM 04/27/2010  . Depression 04/27/2010  . OSTEOARTHRITIS, KNEE 04/27/2010  . Diabetes mellitus type 2, uncontrolled, with complications (HCC) 08/14/2007  . Obesity 10/26/2006  . HYPERTENSION, BENIGN SYSTEMIC 10/26/2006  . VENOUS INSUFFICIENCY, CHRONIC 10/26/2006    Vivien PrestoSimpson, Krishon Adkison C 08/18/2015, 12:46 PM  [facility] [address] [place], [place], [address] Phone: [phone]   Fax:  [phone]  Name: Bridget [name] MRN: [id number] Date of Birth: 11/11/1960    Lavinia SharpsStacy Wilmina Maxham, PT 08/18/2015 12:46 PM Phone: [phone] Fax: [phone]

## 2015-08-20 ENCOUNTER — Ambulatory Visit: Payer: 59 | Admitting: Physical Therapy

## 2015-08-20 DIAGNOSIS — M25512 Pain in left shoulder: Secondary | ICD-10-CM | POA: Diagnosis not present

## 2015-08-20 DIAGNOSIS — M25612 Stiffness of left shoulder, not elsewhere classified: Secondary | ICD-10-CM

## 2015-08-20 DIAGNOSIS — R29898 Other symptoms and signs involving the musculoskeletal system: Secondary | ICD-10-CM

## 2015-08-20 NOTE — Patient Instructions (Signed)
Over Head Pull: Narrow Grip       On back, knees bent, feet flat, band across thighs, elbows straight but relaxed. Pull hands apart (start). Keeping elbows straight, bring arms up and over head, hands toward floor. Keep pull steady on band. Hold momentarily. Return slowly, keeping pull steady, back to start. Repeat _10__ times. Band color ___yellow___   Side Pull: Double Arm   On back, knees bent, feet flat. Arms perpendicular to body, shoulder level, elbows straight but relaxed. Pull arms out to sides, elbows straight. Resistance band comes across collarbones, hands toward floor. Hold momentarily. Slowly return to starting position. Repeat __10_ times. Band color __yellow___   Elisabeth CaraSash  If too painful to do with left, then do only with right. On back, knees bent, feet flat, left hand on left hip, right hand above left. Pull right arm DIAGONALLY (hip to shoulder) across chest. Bring right arm along head toward floor. Hold momentarily. Slowly return to starting position. Repeat _10__ times. Do with left arm. Band color ___yellow___   Shoulder Rotation: Double Arm   On back, knees bent, feet flat, elbows tucked at sides, bent 90, hands palms up. Pull hands apart and down toward floor, keeping elbows near sides. Hold momentarily. Slowly return to starting position. Repeat __10_ times. Band color __yellow____

## 2015-08-20 NOTE — Therapy (Signed)
[place], [place], [address] Phone: [phone]   Fax:  [phone]  Physical Therapy Treatment  Patient Details  Name: Bridget [name] MRN: [id number] Date of Birth: [birth year] Referring Provider: Grier Mitts PA  Encounter Date: 08/20/2015      PT End of Session - 08/20/15 1050    Visit Number 4   Number of Visits 16   Date for PT Re-Evaluation 09/30/15   Authorization Type UMR   PT Start Time 1015   PT Stop Time 1055   PT Time Calculation (min) 40 min   Activity Tolerance Patient tolerated treatment well      Past Medical History  Diagnosis Date  . Diabetes mellitus without complication ([place])   . Osteoarthritis   . GERD (gastroesophageal reflux disease)   . Diverticulosis   . Glaucoma   . Obesity   . Vaginal yeast infection 12/08/2010  . SHOULDER PAIN, LEFT 05/19/2010    Qualifier: Diagnosis of  By: Annamary Carolin MD, Amber    . Retained tampon 05/21/2012  . OSTEOARTHRITIS, KNEE 04/27/2010  . OBESITY, NOS 10/26/2006  . MALAISE AND FATIGUE 05/27/2010  . HEEL SPUR 09/10/2007    Qualifier: Diagnosis of  By: Drue Flirt  MD, Merrily Brittle    . DIVERTICULOSIS, COLON 10/26/2006  . Bacterial vaginosis 12/07/2010  . Hypertension   . Depression   . Headache   . Fibromyalgia     Past Surgical History  Procedure Laterality Date  . Abdominal hysterectomy  1990  . Bladder surgery  2001  . Cataract extraction Right 2001    with implant  . Abdominal hysterectomy    . Eye surgery      implant rt eye  . Rotator cuff repair Right   . Shoulder arthroscopy with subacromial decompression Left 07/20/2015    Procedure: SHOULDER ARTHROSCOPY WITH DEBRIDEMENT OF ROTATOR CUFF AND SUBACROMIAL DECOMPRESSION ;  Surgeon: Tania Ade, MD;  Location: [facility];  Service: Orthopedics;  Laterality: Left;  Left shoulder arthroscopy with debridement of rotator cuff and subacromial decompression    There  were no vitals filed for this visit.  Visit Diagnosis:  Pain in joint of left shoulder  Shoulder stiffness, left  Shoulder weakness      Subjective Assessment - 08/20/15 1017    Subjective Patient reports no complaints.  Wearing TENS unit now which helps.  States she raked her yard this morning and that might be why her arm was sore this morning.  States after therapy last time she had a ltitle more pain.     Currently in Pain? Yes   Pain Score 5    Pain Orientation Left   Pain Type Surgical pain   Pain Onset More than a month ago   Pain Frequency Constant   Aggravating Factors  UE use, raking leaves   Pain Relieving Factors TENS            [facility] PT Assessment - 08/20/15 1024    AROM   Left Shoulder Flexion 130 Degrees   Left Shoulder ABduction 95 Degrees   Left Shoulder Internal Rotation --  left buttock   Left Shoulder External Rotation 53 Degrees                     [facility] Adult PT Treatment/Exercise - 08/20/15 1023    Shoulder Exercises: Supine   Other Supine Exercises supine yellow band scap series: overhead, HABD, ER, diagonals 10x each   Shoulder Exercises:  Prone   Other Prone Exercises kneeling row 15x   Other Prone Exercises kneeling shoulder ext 15x   Shoulder Exercises: Standing   External Rotation Strengthening;Left;15 reps;Theraband   Theraband Level (Shoulder External Rotation) Level 1 (Yellow)   Internal Rotation Strengthening;Left;15 reps;Theraband   Theraband Level (Shoulder Internal Rotation) Level 1 (Yellow)   Flexion Strengthening;Left;15 reps;Theraband   ABduction Strengthening;Left;15 reps;Theraband   Extension Strengthening;Left;15 reps;Theraband   Theraband Level (Shoulder Extension) Level 1 (Yellow)   Row Strengthening;Left;15 reps;Theraband   Theraband Level (Shoulder Row) Level 1 (Yellow)   Other Standing Exercises UE Ranger on boxes 15x each   Other Standing Exercises UE Ranger on wall 15x  L9 and L 12  Level 9    Cryotherapy   Number Minutes Cryotherapy 10 Minutes   Cryotherapy Location Shoulder   Type of Cryotherapy Ice pack                PT Education - 08/20/15 1050    Education provided Yes   Education Details supine scapular stabilization with yellow band   Person(s) Educated Patient   Methods Explanation;Demonstration;Handout   Comprehension Verbalized understanding;Returned demonstration          PT Short Term Goals - 08/20/15 1051    PT SHORT TERM GOAL #1   Title Patient will demonstrate basic self care for AAROM/AROM, ice for pain control   09/02/15   Time 4   Period Weeks   Status Achieved   PT SHORT TERM GOAL #2   Title The patient will have 90 degrees of shoulder flexion/elevation needed for self care grooming, dressing tasks and driving   Status Achieved   PT SHORT TERM GOAL #3   Title The patient will have improved ER ROM to 55 degrees needed for turning the steering wheel.   Time 4   Period Weeks   Status Partially Met   PT SHORT TERM GOAL #4   Title Pain intensity improved by 25% with usual ADLs   Time 4   Period Weeks   Status Partially Met           PT Long Term Goals - 08/20/15 1052    PT LONG TERM GOAL #1   Title The patient will be independent with safe self progression of HEP for further ROM and strengthening   09/30/15   Time 8   Period Weeks   Status On-going   PT LONG TERM GOAL #2   Title The patient will have 125 degrees of left shoulder flexion/elevation needed for reaching the switchboard at work Engineer, manufacturing systems-  code buttons)   Time 8   Period Weeks   Status On-going   PT LONG TERM GOAL #3   Title The patient will have grossly 4/5 left glenohumeral and scapular strength needed for lifting code binders at work   Time 8   Period Weeks   Status On-going   PT LONG TERM GOAL #4   Title The patient will have improved IR and ER to 60 degrees needed for household chores and driving   Time 8   Period Weeks   Status On-going   PT LONG  TERM GOAL #5   Title FOTO functional outcome score improved to 38% indicating improved function with less pain   Time 8   Period Weeks   Status On-going               Plan - 08/20/15 1052    Clinical Impression Statement Reports she is improving with pain  and function but sweeping and raking still difficult.  She states she is unable to do heavy lifting (like a pot of water.).  AROM gradually improving, limited by pain more than stiffness. Unable to work which requires lifting and reaching.  Therapist closely monitoring pain, proper technique and for compensation.     PT Next Visit Plan continue with UE Ranger on wall, AROM, GH and scapular strengthening        Problem List Patient Active Problem List   Diagnosis Date Noted  . GERD (gastroesophageal reflux disease) 09/09/2014  . Other and unspecified hyperlipidemia 05/24/2013  . Lung nodule seen on imaging study 09/21/2012  . Chronic pain of multiple sites - Knees, Ankles, Back 11/09/2011  . Lupus (systemic lupus erythematosus) ([place]) 06/28/2011  . HYPOTHYROIDISM 04/27/2010  . Depression 04/27/2010  . OSTEOARTHRITIS, KNEE 04/27/2010  . Diabetes mellitus type 2, uncontrolled, with complications ([place]) 73/22/5672  . Obesity 10/26/2006  . HYPERTENSION, BENIGN SYSTEMIC 10/26/2006  . VENOUS INSUFFICIENCY, CHRONIC 10/26/2006    Alvera Singh 08/20/2015, 10:59 AM  [facility] [address] [place], [place], [address] Phone: [phone]   Fax:  [phone]  Name: Bridget [name] MRN: [id number] Date of Birth: 12/05/1960    Ruben Im, PT 08/20/2015 11:00 AM Phone: [phone] Fax: [phone]

## 2015-08-26 ENCOUNTER — Ambulatory Visit: Payer: 59 | Admitting: Physical Therapy

## 2015-08-26 DIAGNOSIS — R29898 Other symptoms and signs involving the musculoskeletal system: Secondary | ICD-10-CM

## 2015-08-26 DIAGNOSIS — M25612 Stiffness of left shoulder, not elsewhere classified: Secondary | ICD-10-CM

## 2015-08-26 DIAGNOSIS — M25512 Pain in left shoulder: Secondary | ICD-10-CM | POA: Diagnosis not present

## 2015-08-26 NOTE — Therapy (Signed)
[place], [place], [address] Phone: [phone]   Fax:  [phone]  Physical Therapy Treatment  Patient Details  Name: Bridget [name] MRN: [id number] Date of Birth: [birth year] Referring Provider: Grier Mitts PA  Encounter Date: 08/26/2015      PT End of Session - 08/26/15 1125    Visit Number 5   Number of Visits 16   Date for PT Re-Evaluation 09/30/15   PT Start Time 1020   PT Stop Time 1115   PT Time Calculation (min) 55 min   Activity Tolerance Patient tolerated treatment well   Behavior During Therapy [facility] for tasks assessed/performed      Past Medical History  Diagnosis Date  . Diabetes mellitus without complication ([place])   . Osteoarthritis   . GERD (gastroesophageal reflux disease)   . Diverticulosis   . Glaucoma   . Obesity   . Vaginal yeast infection 12/08/2010  . SHOULDER PAIN, LEFT 05/19/2010    Qualifier: Diagnosis of  By: Annamary Carolin MD, Amber    . Retained tampon 05/21/2012  . OSTEOARTHRITIS, KNEE 04/27/2010  . OBESITY, NOS 10/26/2006  . MALAISE AND FATIGUE 05/27/2010  . HEEL SPUR 09/10/2007    Qualifier: Diagnosis of  By: Drue Flirt  MD, Merrily Brittle    . DIVERTICULOSIS, COLON 10/26/2006  . Bacterial vaginosis 12/07/2010  . Hypertension   . Depression   . Headache   . Fibromyalgia     Past Surgical History  Procedure Laterality Date  . Abdominal hysterectomy  1990  . Bladder surgery  2001  . Cataract extraction Right 2001    with implant  . Abdominal hysterectomy    . Eye surgery      implant rt eye  . Rotator cuff repair Right   . Shoulder arthroscopy with subacromial decompression Left 07/20/2015    Procedure: SHOULDER ARTHROSCOPY WITH DEBRIDEMENT OF ROTATOR CUFF AND SUBACROMIAL DECOMPRESSION ;  Surgeon: Tania Ade, MD;  Location: [facility];  Service: Orthopedics;  Laterality: Left;  Left shoulder arthroscopy with debridement of rotator cuff and  subacromial decompression    There were no vitals filed for this visit.  Visit Diagnosis:  Pain in joint of left shoulder  Shoulder stiffness, left  Shoulder weakness                       [facility] Adult PT Treatment/Exercise - 08/26/15 1020    Self-Care   Self-Care --  AL handout,  demo raking, mopping, etc office set up   Shoulder Exercises: Seated   Retraction AROM  cues for depression movement   External Rotation 5 reps;AROM   Flexion 10 reps;AAROM  LT   Shoulder Exercises: Standing   Protraction Strengthening   Theraband Level (Shoulder Protraction) Level 2 (Red)  serratus anterior, punch, Both   External Rotation Strengthening   Theraband Level (Shoulder External Rotation) Level 2 (Red)  towel at axilla   Internal Rotation Strengthening   Theraband Level (Shoulder Internal Rotation) Level 2 (Red)  towel at axilla   Row Strengthening;Both;10 reps;Theraband   Theraband Level (Shoulder Row) Level 2 (Red)  cues for depression componet of movement to stop mid deltoid   Other Standing Exercises UE ranger flexion 2 minutes for warm up   Manual Therapy   Manual Therapy --  soft tissue work teres minor tense initially.   Manual therapy comments lateral scar, mobilization  PT Education - 08/26/15 1124    Education provided Yes   Education Details ADL, posture at office computer, scar tissue work how to do,  shoulder anatomy related to posture   Person(s) Educated Patient   Methods Explanation;Demonstration;Tactile cues;Verbal cues;Handout   Comprehension Verbalized understanding;Returned demonstration          PT Short Term Goals - 08/26/15 1127    PT SHORT TERM GOAL #1   Title Patient will demonstrate basic self care for AAROM/AROM, ice for pain control   09/02/15   Time 4   Period Weeks   Status Achieved   PT SHORT TERM GOAL #2   Title The patient will have 90 degrees of shoulder flexion/elevation needed for self care  grooming, dressing tasks and driving   Time 4   Period Weeks   Status Achieved   PT SHORT TERM GOAL #3   Title The patient will have improved ER ROM to 55 degrees needed for turning the steering wheel.   Time 4   Period Weeks   Status Partially Met   PT SHORT TERM GOAL #4   Title Pain intensity improved by 25% with usual ADLs   Time 4   Period Weeks   Status Partially Met           PT Long Term Goals - 08/20/15 1052    PT LONG TERM GOAL #1   Title The patient will be independent with safe self progression of HEP for further ROM and strengthening   09/30/15   Time 8   Period Weeks   Status On-going   PT LONG TERM GOAL #2   Title The patient will have 125 degrees of left shoulder flexion/elevation needed for reaching the switchboard at work Engineer, manufacturing systems-  code buttons)   Time 8   Period Weeks   Status On-going   PT LONG TERM GOAL #3   Title The patient will have grossly 4/5 left glenohumeral and scapular strength needed for lifting code binders at work   Time 8   Period Weeks   Status On-going   PT LONG TERM GOAL #4   Title The patient will have improved IR and ER to 60 degrees needed for household chores and driving   Time 8   Period Weeks   Status On-going   PT LONG TERM GOAL #5   Title FOTO functional outcome score improved to 38% indicating improved function with less pain   Time 8   Period Weeks   Status On-going               Plan - 08/26/15 1126    Clinical Impression Statement minimal to moderate pain post session with TENS used during execises.  Able to progress band to red with exercise today.    PT Next Visit Plan continue with UE Ranger on wall, AROM, GH and scapular strengthening   PT Home Exercise Plan red band standing shoulder   Consulted and Agree with Plan of Care Patient        Problem List Patient Active Problem List   Diagnosis Date Noted  . GERD (gastroesophageal reflux disease) 09/09/2014  . Other and unspecified  hyperlipidemia 05/24/2013  . Lung nodule seen on imaging study 09/21/2012  . Chronic pain of multiple sites - Knees, Ankles, Back 11/09/2011  . Lupus (systemic lupus erythematosus) ([place]) 06/28/2011  . HYPOTHYROIDISM 04/27/2010  . Depression 04/27/2010  . OSTEOARTHRITIS, KNEE 04/27/2010  . Diabetes mellitus type 2, uncontrolled, with complications ([place]) 81/27/5170  .  Obesity 10/26/2006  . HYPERTENSION, BENIGN SYSTEMIC 10/26/2006  . VENOUS INSUFFICIENCY, CHRONIC 10/26/2006    [facility] 08/26/2015, 11:30 AM  [facility] [address] [place], [place], [address] Phone: [phone]   Fax:  [phone]  Name: BRUNETTA NEWINGHAM MRN: [id number] Date of Birth: [birth year]    Melvenia Needles, PTA 08/26/2015 11:30 AM Phone: [phone] Fax: [phone]

## 2015-08-26 NOTE — Patient Instructions (Signed)

## 2015-08-28 ENCOUNTER — Ambulatory Visit: Payer: 59 | Admitting: Physical Therapy

## 2015-08-28 DIAGNOSIS — M25512 Pain in left shoulder: Secondary | ICD-10-CM | POA: Diagnosis not present

## 2015-08-28 DIAGNOSIS — R29898 Other symptoms and signs involving the musculoskeletal system: Secondary | ICD-10-CM

## 2015-08-28 DIAGNOSIS — M25612 Stiffness of left shoulder, not elsewhere classified: Secondary | ICD-10-CM

## 2015-08-28 NOTE — Therapy (Signed)
[place], [place], [address] Phone: [phone]   Fax:  [phone]  Physical Therapy Treatment  Patient Details  Name: Bridget [name] MRN: [id number] Date of Birth: 08-Oct-1960 Referring Provider: Grier Mitts PA  Encounter Date: 08/28/2015      PT End of Session - 08/28/15 1105    Visit Number 6   Number of Visits 16   Date for PT Re-Evaluation 09/30/15   Authorization Type UMR   PT Start Time 1015   PT Stop Time 1110   PT Time Calculation (min) 55 min   Activity Tolerance Patient tolerated treatment well      Past Medical History  Diagnosis Date  . Diabetes mellitus without complication ([place])   . Osteoarthritis   . GERD (gastroesophageal reflux disease)   . Diverticulosis   . Glaucoma   . Obesity   . Vaginal yeast infection 12/08/2010  . SHOULDER PAIN, LEFT 05/19/2010    Qualifier: Diagnosis of  By: Annamary Carolin MD, Amber    . Retained tampon 05/21/2012  . OSTEOARTHRITIS, KNEE 04/27/2010  . OBESITY, NOS 10/26/2006  . MALAISE AND FATIGUE 05/27/2010  . HEEL SPUR 09/10/2007    Qualifier: Diagnosis of  By: Drue Flirt  MD, Merrily Brittle    . DIVERTICULOSIS, COLON 10/26/2006  . Bacterial vaginosis 12/07/2010  . Hypertension   . Depression   . Headache   . Fibromyalgia     Past Surgical History  Procedure Laterality Date  . Abdominal hysterectomy  1990  . Bladder surgery  2001  . Cataract extraction Right 2001    with implant  . Abdominal hysterectomy    . Eye surgery      implant rt eye  . Rotator cuff repair Right   . Shoulder arthroscopy with subacromial decompression Left 07/20/2015    Procedure: SHOULDER ARTHROSCOPY WITH DEBRIDEMENT OF ROTATOR CUFF AND SUBACROMIAL DECOMPRESSION ;  Surgeon: Tania Ade, MD;  Location: [place];  Service: Orthopedics;  Laterality: Left;  Left shoulder arthroscopy with debridement of rotator cuff and subacromial decompression    There  were no vitals filed for this visit.  Visit Diagnosis:  Pain in joint of left shoulder  Shoulder stiffness, left  Shoulder weakness      Subjective Assessment - 08/28/15 1015    Subjective I drove to [facility] yesterday and I was hurting last night from turning the steering wheel.  She is wearing her TENS unit for treatment session.     Currently in Pain? Yes   Pain Score 5    Pain Location Shoulder   Pain Orientation Left   Pain Type Surgical pain   Pain Onset More than a month ago   Pain Frequency Constant   Aggravating Factors  driving   Pain Relieving Factors TENS            [facility] PT Assessment - 08/28/15 1022    AROM   Left Shoulder Flexion 144 Degrees   Left Shoulder ABduction 95 Degrees   Left Shoulder Internal Rotation --  left buttock   Left Shoulder External Rotation 38 Degrees   Strength   Left Shoulder Flexion 3-/5   Left Shoulder Extension 3-/5   Left Shoulder ABduction 3/5   Left Shoulder Internal Rotation 3/5                     [facility] Adult PT Treatment/Exercise - 08/28/15 1017    Shoulder Exercises: Supine   Horizontal ABduction Strengthening;Left;10 reps;Weights  Flexion Strengthening;Left;20 reps;Weights   Shoulder Exercises: Seated   Other Seated Exercises scaption place and holds 5 sec hold 8x   Shoulder Exercises: Sidelying   External Rotation Strengthening;Left;20 reps;Weights   External Rotation Weight (lbs) 2   ABduction Strengthening;Left;20 reps;Weights   ABduction Weight (lbs) 1   Shoulder Exercises: Standing   Flexion Strengthening;Left;10 reps  on wall   Theraband Level (Shoulder Flexion) Level 1 (Yellow)   Extension Strengthening;Left;20 reps;Theraband   Theraband Level (Shoulder Extension) Level 2 (Red)   Other Standing Exercises UE Ranger on wall L2, L9, L17 20x  each   Other Standing Exercises yellow band PNF extensions D1D2 10x each   Cryotherapy   Number Minutes Cryotherapy 10 Minutes   Cryotherapy  Location Shoulder   Type of Cryotherapy Ice pack   Manual Therapy   Manual Therapy Soft tissue mobilization   Soft tissue mobilization left deltoids, upper traps and periscap muscles                  PT Short Term Goals - 08/28/15 1103    PT SHORT TERM GOAL #1   Title Patient will demonstrate basic self care for AAROM/AROM, ice for pain control   09/02/15   Status Achieved   PT SHORT TERM GOAL #2   Title The patient will have 90 degrees of shoulder flexion/elevation needed for self care grooming, dressing tasks and driving   Status Achieved   PT SHORT TERM GOAL #3   Title The patient will have improved ER ROM to 55 degrees needed for turning the steering wheel.   Time 4   Period Weeks   Status Partially Met   PT SHORT TERM GOAL #4   Title Pain intensity improved by 25% with usual ADLs   Status Achieved           PT Long Term Goals - 08/28/15 1104    PT LONG TERM GOAL #1   Title The patient will be independent with safe self progression of HEP for further ROM and strengthening   09/30/15   Time 8   Period Weeks   Status On-going   PT LONG TERM GOAL #2   Title The patient will have 125 degrees of left shoulder flexion/elevation needed for reaching the switchboard at work Engineer, manufacturing systems-  code buttons)   Time 8   Period Weeks   Status Partially Met   PT LONG TERM GOAL #3   Title The patient will have grossly 4/5 left glenohumeral and scapular strength needed for lifting code binders at work   Time 8   Period Weeks   Status On-going   PT LONG TERM GOAL #4   Title The patient will have improved IR and ER to 60 degrees needed for household chores and driving   Time 8   Period Weeks   Status On-going   PT LONG TERM GOAL #5   Title FOTO functional outcome score improved to 38% indicating improved function with less pain   Time 8   Period Weeks   Status On-going               Plan - 08/28/15 1018    Clinical Impression Statement Plans on return to  work on 09/01/15.  She continues to be moderately painful, possibly aggravated by fibromyalgia.  She does state her pain is 50% better since starting PT.   Shoulder flexion improving although still limited along with Abd, IR and ER.  She will need continued PT to  address these deficits.     PT Next Visit Plan continue with UE Ranger on wall, AROM, GH and scapular strengthening;  see how return to work went (light duty)        Problem List Patient Active Problem List   Diagnosis Date Noted  . GERD (gastroesophageal reflux disease) 09/09/2014  . Other and unspecified hyperlipidemia 05/24/2013  . Lung nodule seen on imaging study 09/21/2012  . Chronic pain of multiple sites - Knees, Ankles, Back 11/09/2011  . Lupus (systemic lupus erythematosus) ([place]) 06/28/2011  . HYPOTHYROIDISM 04/27/2010  . Depression 04/27/2010  . OSTEOARTHRITIS, KNEE 04/27/2010  . Diabetes mellitus type 2, uncontrolled, with complications ([place]) 41/96/2229  . Obesity 10/26/2006  . HYPERTENSION, BENIGN SYSTEMIC 10/26/2006  . VENOUS INSUFFICIENCY, CHRONIC 10/26/2006    Alvera Singh 08/28/2015, 11:06 AM  [facility] [address] [place], [place], [address] Phone: [phone]   Fax:  [phone]  Name: MELONY TENPAS MRN: [id number] Date of Birth: 10/14/1960    Ruben Im, PT 08/28/2015 11:06 AM Phone: [phone] Fax: [phone]

## 2015-09-01 ENCOUNTER — Ambulatory Visit: Payer: 59 | Attending: Internal Medicine | Admitting: Physical Therapy

## 2015-09-01 DIAGNOSIS — R29898 Other symptoms and signs involving the musculoskeletal system: Secondary | ICD-10-CM | POA: Diagnosis not present

## 2015-09-01 DIAGNOSIS — M25512 Pain in left shoulder: Secondary | ICD-10-CM

## 2015-09-01 DIAGNOSIS — M25612 Stiffness of left shoulder, not elsewhere classified: Secondary | ICD-10-CM | POA: Diagnosis not present

## 2015-09-01 NOTE — Therapy (Signed)
[place], [place], [address] Phone: [phone]   Fax:  [phone]  Physical Therapy Treatment  Patient Details  Name: Bridget [name] MRN: [id number] Date of Birth: 11/26/1960 Referring Provider: Grier Mitts PA  Encounter Date: 09/01/2015      PT End of Session - 09/01/15 1810    Visit Number 7   Number of Visits 16   Date for PT Re-Evaluation 09/30/15   PT Start Time 1020   PT Stop Time 1110   PT Time Calculation (min) 50 min   Activity Tolerance Patient tolerated treatment well;No increased pain   Behavior During Therapy [facility] for tasks assessed/performed      Past Medical History  Diagnosis Date  . Diabetes mellitus without complication ([place])   . Osteoarthritis   . GERD (gastroesophageal reflux disease)   . Diverticulosis   . Glaucoma   . Obesity   . Vaginal yeast infection 12/08/2010  . SHOULDER PAIN, LEFT 05/19/2010    Qualifier: Diagnosis of  By: Annamary Carolin MD, Amber    . Retained tampon 05/21/2012  . OSTEOARTHRITIS, KNEE 04/27/2010  . OBESITY, NOS 10/26/2006  . MALAISE AND FATIGUE 05/27/2010  . HEEL SPUR 09/10/2007    Qualifier: Diagnosis of  By: Drue Flirt  MD, Merrily Brittle    . DIVERTICULOSIS, COLON 10/26/2006  . Bacterial vaginosis 12/07/2010  . Hypertension   . Depression   . Headache   . Fibromyalgia     Past Surgical History  Procedure Laterality Date  . Abdominal hysterectomy  1990  . Bladder surgery  2001  . Cataract extraction Right 2001    with implant  . Abdominal hysterectomy    . Eye surgery      implant rt eye  . Rotator cuff repair Right   . Shoulder arthroscopy with subacromial decompression Left 07/20/2015    Procedure: SHOULDER ARTHROSCOPY WITH DEBRIDEMENT OF ROTATOR CUFF AND SUBACROMIAL DECOMPRESSION ;  Surgeon: Tania Ade, MD;  Location: [facility];  Service: Orthopedics;  Laterality: Left;  Left shoulder arthroscopy with debridement of rotator  cuff and subacromial decompression    There were no vitals filed for this visit.  Visit Diagnosis:  Pain in joint of left shoulder  Shoulder stiffness, left  Shoulder weakness      Subjective Assessment - 09/01/15 1027    Subjective 4/10.  I am feeling a little better.  TENS unit on.  Massaging it helped.  I d it myself.   Currently in Pain? Yes   Pain Score 4    Pain Location Shoulder   Pain Orientation Left   Pain Descriptors / Indicators Dull   Pain Frequency Constant   Aggravating Factors  driving   Pain Relieving Factors TEMS, soft tissue work                         [facility] Adult PT Treatment/Exercise - 09/01/15 0001    Shoulder Exercises: Seated   Retraction AROM  better movement patterns.   Row 10 reps   Theraband Level (Shoulder Row) Level 2 (Red)   External Rotation 15 reps   Theraband Level (Shoulder External Rotation) Level 1 (Yellow)   Other Seated Exercises Elbow extension 15 red band   Cryotherapy   Number Minutes Cryotherapy 10 Minutes   Cryotherapy Location Shoulder   Type of Cryotherapy Other (comment)  cold pack   Manual Therapy   Manual Therapy Soft tissue mobilization   Manual therapy comments  AAROM flelion, Abduction, ER   Soft tissue mobilization left deltoids, upper traps and periscap muscles                  PT Short Term Goals - 09/01/15 1812    PT SHORT TERM GOAL #1   Title Patient will demonstrate basic self care for AAROM/AROM, ice for pain control   09/02/15   Time 4   Period Weeks   Status Achieved   PT SHORT TERM GOAL #2   Title The patient will have 90 degrees of shoulder flexion/elevation needed for self care grooming, dressing tasks and driving   Time 4   Period Weeks   Status Achieved   PT SHORT TERM GOAL #3   Title The patient will have improved ER ROM to 55 degrees needed for turning the steering wheel.   Time 4   Period Weeks   Status On-going   PT SHORT TERM GOAL #4   Title Pain intensity  improved by 25% with usual ADLs   Time 4   Period Weeks   Status Achieved           PT Long Term Goals - 09/01/15 1813    PT LONG TERM GOAL #1   Time 8   Period Weeks   Status On-going   PT LONG TERM GOAL #2   Title The patient will have 125 degrees of left shoulder flexion/elevation needed for reaching the switchboard at work Engineer, manufacturing systems-  code buttons)   Time 8   Period Weeks   Status Partially Met   PT LONG TERM GOAL #3   Title The patient will have grossly 4/5 left glenohumeral and scapular strength needed for lifting code binders at work   Time 8   Status On-going   PT [place] #4   Title The patient will have improved IR and ER to 60 degrees needed for household chores and driving   Time 8   Period Weeks   Status On-going   PT LONG TERM GOAL #5   Title FOTO functional outcome score improved to 38% indicating improved function with less pain   Time 8   Period Weeks   Status Unable to assess               Plan - 09/01/15 1810    Clinical Impression Statement continue to work on range and strengthening.  Pain improving with soft tissue work.   PT Next Visit Plan continue with UE Ranger on wall, AROM, GH and scapular strengthening;  see how return to work went (light duty)   PT [place] continue red band work   Oncologist with Plan of Care Patient        Problem List Patient Active Problem List   Diagnosis Date Noted  . GERD (gastroesophageal reflux disease) 09/09/2014  . Other and unspecified hyperlipidemia 05/24/2013  . Lung nodule seen on imaging study 09/21/2012  . Chronic pain of multiple sites - Knees, Ankles, Back 11/09/2011  . Lupus (systemic lupus erythematosus) ([place]) 06/28/2011  . HYPOTHYROIDISM 04/27/2010  . Depression 04/27/2010  . OSTEOARTHRITIS, KNEE 04/27/2010  . Diabetes mellitus type 2, uncontrolled, with complications ([place]) 83/15/1761  . Obesity 10/26/2006  . HYPERTENSION, BENIGN SYSTEMIC 10/26/2006   . VENOUS INSUFFICIENCY, CHRONIC 10/26/2006    [facility] 09/01/2015, 6:14 PM  [facility] [address] [place], [place], [address] Phone: [phone]   Fax:  [phone]  Name: Bridget [name]  MRN: [id number] Date of Birth: 1961-07-12    Melvenia Needles, PTA 09/01/2015 6:14 PM Phone: [phone] Fax: [phone]

## 2015-09-03 ENCOUNTER — Ambulatory Visit: Payer: 59 | Admitting: Physical Therapy

## 2015-09-03 DIAGNOSIS — M25512 Pain in left shoulder: Secondary | ICD-10-CM

## 2015-09-03 DIAGNOSIS — M25612 Stiffness of left shoulder, not elsewhere classified: Secondary | ICD-10-CM

## 2015-09-03 DIAGNOSIS — R29898 Other symptoms and signs involving the musculoskeletal system: Secondary | ICD-10-CM

## 2015-09-03 NOTE — Therapy (Signed)
[place], [place], [address] Phone: [phone]   Fax:  [phone]  Physical Therapy Treatment  Patient Details  Name: Bridget [name] MRN: [id number] Date of Birth: [birth year] Referring Provider: Grier Mitts PA  Encounter Date: 09/03/2015      PT End of Session - 09/03/15 1024    Visit Number 8   Number of Visits 16   Date for PT Re-Evaluation 09/30/15   Authorization Type UMR   PT Start Time 1015   PT Stop Time 1100   PT Time Calculation (min) 45 min   Activity Tolerance Patient tolerated treatment well      Past Medical History  Diagnosis Date  . Diabetes mellitus without complication ([place])   . Osteoarthritis   . GERD (gastroesophageal reflux disease)   . Diverticulosis   . Glaucoma   . Obesity   . Vaginal yeast infection 12/08/2010  . SHOULDER PAIN, LEFT 05/19/2010    Qualifier: Diagnosis of  By: Annamary Carolin MD, Amber    . Retained tampon 05/21/2012  . OSTEOARTHRITIS, KNEE 04/27/2010  . OBESITY, NOS 10/26/2006  . MALAISE AND FATIGUE 05/27/2010  . HEEL SPUR 09/10/2007    Qualifier: Diagnosis of  By: Drue Flirt  MD, Merrily Brittle    . DIVERTICULOSIS, COLON 10/26/2006  . Bacterial vaginosis 12/07/2010  . Hypertension   . Depression   . Headache   . Fibromyalgia     Past Surgical History  Procedure Laterality Date  . Abdominal hysterectomy  1990  . Bladder surgery  2001  . Cataract extraction Right 2001    with implant  . Abdominal hysterectomy    . Eye surgery      implant rt eye  . Rotator cuff repair Right   . Shoulder arthroscopy with subacromial decompression Left 07/20/2015    Procedure: SHOULDER ARTHROSCOPY WITH DEBRIDEMENT OF ROTATOR CUFF AND SUBACROMIAL DECOMPRESSION ;  Surgeon: Tania Ade, MD;  Location: [facility];  Service: Orthopedics;  Laterality: Left;  Left shoulder arthroscopy with debridement of rotator cuff and subacromial decompression    There were  no vitals filed for this visit.  Visit Diagnosis:  Pain in joint of left shoulder  Shoulder stiffness, left  Shoulder weakness      Subjective Assessment - 09/03/15 1019    Subjective States she went back to work this week.  Sore this AM from raking leaves this morning.     Currently in Pain? Yes   Pain Score 5    Pain Location Shoulder   Pain Orientation Left   Pain Type Surgical pain   Pain Onset More than a month ago   Pain Frequency Constant   Aggravating Factors  sometimes turning the steering wheel right            [facility] PT Assessment - 09/03/15 1029    AROM   Left Shoulder Flexion 153 Degrees   Left Shoulder ABduction 128 Degrees   Left Shoulder Internal Rotation --  L5   Left Shoulder External Rotation 53 Degrees   Strength   Left Shoulder Flexion 3+/5   Left Shoulder Extension 3+/5   Left Shoulder ABduction 3+/5   Left Shoulder Internal Rotation 3+/5                     [facility] Adult PT Treatment/Exercise - 09/03/15 1025    Shoulder Exercises: Supine   Other Supine Exercises yellow band scap series:  overhead, HABD, sash, ER 10x each  Shoulder Exercises: Sidelying   External Rotation Strengthening;Left;20 reps;Weights   External Rotation Weight (lbs) 3   Shoulder Exercises: Standing   Protraction Strengthening   Theraband Level (Shoulder Protraction) Level 2 (Red)  serratus anterior, punch, Both   External Rotation Strengthening   Theraband Level (Shoulder External Rotation) Level 2 (Red)  towel at axilla   Internal Rotation Strengthening   Theraband Level (Shoulder Internal Rotation) Level 2 (Red)  towel at axilla   Flexion Strengthening;Left;20 reps;Theraband   Theraband Level (Shoulder Flexion) Level 2 (Red)   Extension Strengthening;Left;20 reps;Theraband   Theraband Level (Shoulder Extension) Level 2 (Red)   Row Strengthening;Both;10 reps;Theraband   Theraband Level (Shoulder Row) Level 2 (Red)  cues for depression componet of  movement to stop mid deltoid   Shoulder Elevation Strengthening;Left;15 reps;Standing  scaption 1# 15x   Other Standing Exercises UE Ranger L10, 16,18 10-15x each   Other Standing Exercises red band PNF extensions D1D2 15x each   Cryotherapy   Number Minutes Cryotherapy 10 Minutes   Cryotherapy Location Shoulder   Type of Cryotherapy Ice pack                  PT Short Term Goals - 09/03/15 1048    PT SHORT TERM GOAL #1   Title Patient will demonstrate basic self care for AAROM/AROM, ice for pain control   09/02/15   Status Achieved   PT SHORT TERM GOAL #2   Title The patient will have 90 degrees of shoulder flexion/elevation needed for self care grooming, dressing tasks and driving   Status Achieved   PT SHORT TERM GOAL #3   Title The patient will have improved ER ROM to 55 degrees needed for turning the steering wheel.   Time 4   Period Weeks   Status Partially Met   PT SHORT TERM GOAL #4   Title Pain intensity improved by 25% with usual ADLs   Status Achieved           PT Long Term Goals - 09/03/15 1049    PT LONG TERM GOAL #1   Title The patient will be independent with safe self progression of HEP for further ROM and strengthening   09/30/15   Time 8   Period Weeks   Status On-going   PT LONG TERM GOAL #2   Title The patient will have 125 degrees of left shoulder flexion/elevation needed for reaching the switchboard at work Engineer, manufacturing systems-  code buttons)   Status Achieved   PT LONG TERM GOAL #3   Title The patient will have grossly 4/5 left glenohumeral and scapular strength needed for lifting code binders at work   Time 8   Period Weeks   Status On-going   PT LONG TERM GOAL #4   Title The patient will have improved IR and ER to 60 degrees needed for household chores and driving   Time 8   Period Weeks   Status On-going   PT LONG TERM GOAL #5   Title FOTO functional outcome score improved to 38% indicating improved function with less pain   Time 8    Period Weeks   Status On-going               Plan - 09/03/15 1042    Clinical Impression Statement Good improvement with shoulder AROM as well as strength.  She continues to be moderately painful, fibromyalgia may be a factor.  She uses her Home TENS for pain control with ex's.  Therapist closely monitoring pain and proper technique.     PT Next Visit Plan continue with UE Ranger on wall, AROM, GH and scapular strengthening;  continue 1-2 more weeks for further ROM and strength progression        Problem List Patient Active Problem List   Diagnosis Date Noted  . GERD (gastroesophageal reflux disease) 09/09/2014  . Other and unspecified hyperlipidemia 05/24/2013  . Lung nodule seen on imaging study 09/21/2012  . Chronic pain of multiple sites - Knees, Ankles, Back 11/09/2011  . Lupus (systemic lupus erythematosus) ([place]) 06/28/2011  . HYPOTHYROIDISM 04/27/2010  . Depression 04/27/2010  . OSTEOARTHRITIS, KNEE 04/27/2010  . Diabetes mellitus type 2, uncontrolled, with complications ([place]) 68/10/2120  . Obesity 10/26/2006  . HYPERTENSION, BENIGN SYSTEMIC 10/26/2006  . VENOUS INSUFFICIENCY, CHRONIC 10/26/2006    Alvera Singh 09/03/2015, 10:55 AM  [facility] [address] [place], [place], [address] Phone: [phone]   Fax:  [phone]  Name: Bridget [name] MRN: [id number] Date of Birth: 22-Sep-1960    Ruben Im, PT 09/03/2015 10:55 AM Phone: [phone] Fax: [phone]

## 2015-09-08 ENCOUNTER — Other Ambulatory Visit: Payer: Self-pay | Admitting: Internal Medicine

## 2015-09-08 ENCOUNTER — Ambulatory Visit: Payer: 59 | Admitting: Physical Therapy

## 2015-09-08 DIAGNOSIS — M25512 Pain in left shoulder: Secondary | ICD-10-CM | POA: Diagnosis not present

## 2015-09-08 DIAGNOSIS — M25612 Stiffness of left shoulder, not elsewhere classified: Secondary | ICD-10-CM

## 2015-09-08 DIAGNOSIS — R29898 Other symptoms and signs involving the musculoskeletal system: Secondary | ICD-10-CM

## 2015-09-08 MED FILL — LISINOPRIL 5 MG TABLET: 5 | 90 days supply | Qty: 90 | Fill #0

## 2015-09-08 MED FILL — JANUVIA 100 MG TABLET: 100 | 90 days supply | Qty: 90 | Fill #0

## 2015-09-08 NOTE — Therapy (Signed)
[facility]patient Rehabilitation [facility] [address] [place], [place], [address] Phone: [phone]   Fax:  [phone]  Physical Therapy Treatment  Patient Details  Name: Bridget [name] MRN: [id number] Date of Birth: 1961/07/26 Referring Provider: Jiles Harold PA  Encounter Date: 09/08/2015      PT End of Session - 09/08/15 1115    Visit Number 9   Number of Visits 16   Date for PT Re-Evaluation 09/30/15   PT Start Time 1020   PT Stop Time 1120   PT Time Calculation (min) 60 min   Activity Tolerance Patient tolerated treatment well;No increased pain   Behavior During Therapy [facility] for tasks assessed/performed      Past Medical History  Diagnosis Date  . Diabetes mellitus without complication (HCC)   . Osteoarthritis   . GERD (gastroesophageal reflux disease)   . Diverticulosis   . Glaucoma   . Obesity   . Vaginal yeast infection 12/08/2010  . SHOULDER PAIN, LEFT 05/19/2010    Qualifier: Diagnosis of  By: Clotilde Dieter MD, Amber    . Retained tampon 05/21/2012  . OSTEOARTHRITIS, KNEE 04/27/2010  . OBESITY, NOS 10/26/2006  . MALAISE AND FATIGUE 05/27/2010  . HEEL SPUR 09/10/2007    Qualifier: Diagnosis of  By: Lanier Prude  MD, Cathrine Muster    . DIVERTICULOSIS, COLON 10/26/2006  . Bacterial vaginosis 12/07/2010  . Hypertension   . Depression   . Headache   . Fibromyalgia     Past Surgical History  Procedure Laterality Date  . Abdominal hysterectomy  1990  . Bladder surgery  2001  . Cataract extraction Right 2001    with implant  . Abdominal hysterectomy    . Eye surgery      implant rt eye  . Rotator cuff repair Right   . Shoulder arthroscopy with subacromial decompression Left 07/20/2015    Procedure: SHOULDER ARTHROSCOPY WITH DEBRIDEMENT OF ROTATOR CUFF AND SUBACROMIAL DECOMPRESSION ;  Surgeon: Jones Broom, MD;  Location: [facility];  Service: Orthopedics;  Laterality: Left;  Left shoulder arthroscopy with debridement of rotator  cuff and subacromial decompression    There were no vitals filed for this visit.  Visit Diagnosis:  Pain in joint of left shoulder  Shoulder stiffness, left  Shoulder weakness      Subjective Assessment - 09/08/15 1025    Subjective 4-5/10 .Marland Kitchen   Currently in Pain? Yes   Pain Score 5    Pain Location Shoulder   Pain Orientation Left   Pain Descriptors / Indicators Throbbing   Aggravating Factors  Cold weather   Pain Relieving Factors TENS   Multiple Pain Sites Yes                         [facility] Adult PT Treatment/Exercise - 09/08/15 1043    Shoulder Exercises: Supine   Horizontal ABduction 10 reps   Theraband Level (Shoulder Horizontal ABduction) Level 1 (Yellow)   External Rotation 10 reps   Theraband Level (Shoulder External Rotation) Level 1 (Yellow)   Flexion 10 reps  sash, both directions, and narrow grip flexion   Theraband Level (Shoulder Flexion) Level 1 (Yellow)   Other Supine Exercises supine scapular stabilization  4 LBS, flexion/extension/horizantal abd/ADD. circles rests   Other Supine Exercises 4 LBS triceps.  10 X  PTA holding elbow   Shoulder Exercises: Sidelying   Other Sidelying Exercises Horizontal abduction with 4 LBS.  Semi reclined 10 X   Shoulder Exercises: Standing  Other Standing Exercises UE Ranger   Cryotherapy   Number Minutes Cryotherapy 10 Minutes   Cryotherapy Location Shoulder   Type of Cryotherapy --  cold pack   Manual Therapy   Manual Therapy Soft tissue mobilization;Other (comment)   Soft tissue mobilization upper quadrant LT to soften tissue and gently stretch ROM LT,  tissue softened                PT Education - 09/08/15 1123    Education provided Yes   Education Details Can get replacement electrodes on [employer].   Person(s) Educated Patient   Methods Explanation   Comprehension Verbalized understanding          PT Short Term Goals - 09/08/15 1123    PT SHORT TERM GOAL #1   Title Patient will  demonstrate basic self care for AAROM/AROM, ice for pain control   09/02/15   Status Achieved   PT SHORT TERM GOAL #2   Title The patient will have 90 degrees of shoulder flexion/elevation needed for self care grooming, dressing tasks and driving   Status Achieved   PT SHORT TERM GOAL #3   Title The patient will have improved ER ROM to 55 degrees needed for turning the steering wheel.   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)   Time 8   Period Weeks   Status Achieved   PT LONG TERM GOAL #3   Title The patient will have grossly 4/5 left glenohumeral and scapular strength needed for lifting code binders at work   Time 8   Period Weeks   Status On-going   PT LONG TERM GOAL #4   Title The patient will have improved IR and ER to 60 degrees needed for household chores and driving   Time 8   Period Weeks   Status Unable to assess   PT LONG TERM GOAL #5   Title FOTO functional outcome score improved to 38% indicating improved function with less pain   Time 8   Period Weeks   Status Unable to assess               Plan - 09/08/15 1118    Clinical Impression Statement Patient has been able to return to work.  She cannot lift a heavy notebook with her LT hand yet.  She says her Function and Motion started to improve when she started to do her home exercises more at home.  She is also using her arm more at home and can now get something out of shelves overhead. She wants to  be discharged afteer next visit.   PT Next Visit Plan measure, FOTO, ROM check goals.  Answer any home exercise questions.        Problem List Patient Active Problem List   Diagnosis Date Noted  .  GERD (gastroesophageal reflux disease) 09/09/2014  . Other and unspecified hyperlipidemia 05/24/2013  . Lung nodule seen on imaging study 09/21/2012  . Chronic pain of multiple sites - Knees, Ankles, Back 11/09/2011  . Lupus (systemic lupus erythematosus) (HCC) 06/28/2011  . HYPOTHYROIDISM 04/27/2010  . Depression 04/27/2010  . OSTEOARTHRITIS, KNEE 04/27/2010  . Diabetes mellitus type 2, uncontrolled, with complications (HCC) 08/14/2007  . Obesity 10/26/2006  . HYPERTENSION, BENIGN SYSTEMIC 10/26/2006  . VENOUS INSUFFICIENCY, CHRONIC 10/26/2006    [facility] 09/08/2015, 11:26 AM  [facility] [address] [place], [place], [address] Phone: [phone]   Fax:  [phone]  Name: Bridget [name] MRN: [id number] Date of Birth: 02-27-1961    Liz Beach, PTA 09/08/2015 11:26 AM Phone: [phone] Fax: [phone]

## 2015-09-09 ENCOUNTER — Other Ambulatory Visit (INDEPENDENT_AMBULATORY_CARE_PROVIDER_SITE_OTHER): Payer: 59

## 2015-09-09 ENCOUNTER — Ambulatory Visit (INDEPENDENT_AMBULATORY_CARE_PROVIDER_SITE_OTHER): Payer: 59 | Admitting: Internal Medicine

## 2015-09-09 ENCOUNTER — Encounter: Payer: Self-pay | Admitting: Internal Medicine

## 2015-09-09 VITALS — BP 130/88 | HR 78 | Temp 98.5°F | Resp 14 | Ht 65.5 in | Wt 240.0 lb

## 2015-09-09 DIAGNOSIS — M25512 Pain in left shoulder: Secondary | ICD-10-CM | POA: Diagnosis not present

## 2015-09-09 DIAGNOSIS — G8929 Other chronic pain: Secondary | ICD-10-CM

## 2015-09-09 DIAGNOSIS — IMO0002 Reserved for concepts with insufficient information to code with codable children: Secondary | ICD-10-CM

## 2015-09-09 DIAGNOSIS — G4709 Other insomnia: Secondary | ICD-10-CM | POA: Diagnosis not present

## 2015-09-09 DIAGNOSIS — E1165 Type 2 diabetes mellitus with hyperglycemia: Secondary | ICD-10-CM | POA: Diagnosis not present

## 2015-09-09 DIAGNOSIS — R52 Pain, unspecified: Secondary | ICD-10-CM

## 2015-09-09 DIAGNOSIS — E1140 Type 2 diabetes mellitus with diabetic neuropathy, unspecified: Secondary | ICD-10-CM | POA: Diagnosis not present

## 2015-09-09 DIAGNOSIS — E118 Type 2 diabetes mellitus with unspecified complications: Secondary | ICD-10-CM

## 2015-09-09 DIAGNOSIS — R5381 Other malaise: Secondary | ICD-10-CM | POA: Diagnosis not present

## 2015-09-09 DIAGNOSIS — I10 Essential (primary) hypertension: Secondary | ICD-10-CM

## 2015-09-09 DIAGNOSIS — E785 Hyperlipidemia, unspecified: Secondary | ICD-10-CM

## 2015-09-09 DIAGNOSIS — M797 Fibromyalgia: Secondary | ICD-10-CM | POA: Diagnosis not present

## 2015-09-09 LAB — LIPID PANEL
CHOLESTEROL: 202 mg/dL — AB (ref 0–200)
HDL: 47 mg/dL (ref >39.00)
NonHDL: 154.85
TRIGLYCERIDES: 227 mg/dL — AB (ref 0.0–149.0)
Total CHOL/HDL Ratio: 4
VLDL: 45.4 mg/dL — ABNORMAL HIGH (ref 0.0–40.0)

## 2015-09-09 LAB — HEMOGLOBIN A1C: HEMOGLOBIN A1C: 7.4 % — AB (ref 4.6–6.5)

## 2015-09-09 LAB — LDL CHOLESTEROL, DIRECT: Direct LDL: 119 mg/dL

## 2015-09-09 NOTE — Patient Instructions (Signed)
We will switch the diabetes medicines and stop the invokana. Depending on the control today we will add a medicine to replace the invokana.   We have done the handicapped sticker today and can do the fmla when it is due.   We would like to see you back in about 3-6 months to check on the diabetes.   Diabetes and Standards of Medical Care Diabetes is complicated. You may find that your diabetes team includes a dietitian, nurse, diabetes educator, eye doctor, and more. To help everyone know what is going on and to help you get the care you deserve, the following schedule of care was developed to help keep you on track. Below are the tests, exams, vaccines, medicines, education, and plans you will need. HbA1c test This test shows how well you have controlled your glucose over the past 2-3 months. It is used to see if your diabetes management plan needs to be adjusted.   It is performed at least 2 times a year if you are meeting treatment goals.  It is performed 4 times a year if therapy has changed or if you are not meeting treatment goals. Blood pressure test  This test is performed at every routine medical visit. The goal is less than 140/90 mm Hg for most people, but 130/80 mm Hg in some cases. Ask your health care provider about your goal. Dental exam  Follow up with the dentist regularly. Eye exam  If you are diagnosed with type 1 diabetes as a child, get an exam upon reaching the age of 10 years or older and having had diabetes for 3-5 years. Yearly eye exams are recommended after that initial eye exam.  If you are diagnosed with type 1 diabetes as an adult, get an exam within 5 years of diagnosis and then yearly.  If you are diagnosed with type 2 diabetes, get an exam as soon as possible after the diagnosis and then yearly. Foot care exam  Visual foot exams are performed at every routine medical visit. The exams check for cuts, injuries, or other problems with the feet.  You should  have a complete foot exam performed every year. This exam includes an inspection of the structure and skin of your feet, a check of the pulses in your feet, and a check of the sensation in your feet.  Type 1 diabetes: The first exam is performed 5 years after diagnosis.  Type 2 diabetes: The first exam is performed at the time of diagnosis.  Check your feet nightly for cuts, injuries, or other problems with your feet. Tell your health care provider if anything is not healing. Kidney function test (urine microalbumin)  This test is performed once a year.  Type 1 diabetes: The first test is performed 5 years after diagnosis.  Type 2 diabetes: The first test is performed at the time of diagnosis.  A serum creatinine and estimated glomerular filtration rate (eGFR) test is done once a year to assess the level of chronic kidney disease (CKD), if present. Lipid profile (cholesterol, HDL, LDL, triglycerides)  Performed every 5 years for most people.  The goal for LDL is less than 100 mg/dL. If you are at high risk, the goal is less than 70 mg/dL.  The goal for HDL is 40 mg/dL-50 mg/dL for men and 50 HY/AL-20 mg/dL for women. An HDL cholesterol of 60 mg/dL or higher gives some protection against heart disease.  The goal for triglycerides is less than 150 mg/dL. Immunizations  The flu (influenza) vaccine is recommended yearly for every person 60 months of age or older who has diabetes.  The pneumonia (pneumococcal) vaccine is recommended for every person 26 years of age or older who has diabetes. Adults 27 years of age or older may receive the pneumonia vaccine as a series of two separate shots.  The hepatitis B vaccine is recommended for adults shortly after they have been diagnosed with diabetes.  The Tdap (tetanus, diphtheria, and pertussis) vaccine should be given:  According to normal childhood vaccination schedules, for children.  Every 10 years, for adults who have diabetes. Diabetes  self-management education  Education is recommended at diagnosis and ongoing as needed. Treatment plan  Your treatment plan is reviewed at every medical visit.   This information is not intended to replace advice given to you by your health care provider. Make sure you discuss any questions you have with your health care provider.   Document Released: 06/12/2009 Document Revised: 09/05/2014 Document Reviewed: 01/15/2013 Elsevier Interactive Patient Education [employer].

## 2015-09-09 NOTE — Progress Notes (Signed)
Pre visit review using our clinic review tool, if applicable. No additional management support is needed unless otherwise documented below in the visit note. 

## 2015-09-09 NOTE — Assessment & Plan Note (Signed)
Seeing rheumatology and [facility]. She is currently on FMLA with 6 days per month. She wants increase but is not sure to what. Will await her decision to see if it is reasonable.

## 2015-09-09 NOTE — Progress Notes (Signed)
   Subjective:    Patient ID: Bridget [name], female    DOB: 1961/05/09, 55 y.o.   MRN: [id number]  HPI The patient is a 55 YO female coming in for follow up of her medical conditions including her diabetes (somewhat higher sugars lately due to surgery, taking invokana, januvia and amaryl, on ACE-I, not well controlled, complicated by neuropathy), her blood pressure (under good control with her lisinopril, not complicated), and her chronic pain (using lyrica and oxycodone which is leftover from her shoulder surgery, needs FMLA renewed soon and does not feel that 6 days per month is fair for her condition, pain controlled with lyrica but limited by tiredness and she is not able to take when working). She denies new complaints. Will see her rheumatologist today and was [facility] since [facility] were not able to make a diagnosis.   Review of Systems  Constitutional: Positive for fatigue. Negative for fever, activity change and appetite change.  Respiratory: Negative for cough, chest tightness, shortness of breath and wheezing.   Cardiovascular: Negative for chest pain, palpitations and leg swelling.  Gastrointestinal: Negative for nausea, diarrhea and constipation.  Genitourinary: Positive for dysuria. Negative for frequency, flank pain, vaginal discharge and vaginal pain.       Yeast infections  Musculoskeletal: Positive for myalgias, back pain and arthralgias. Negative for gait problem.  Skin: Negative.   Neurological: Negative for dizziness, weakness and light-headedness.  Psychiatric/Behavioral: Positive for sleep disturbance.      Objective:   Physical Exam  Constitutional: She is oriented to person, place, and time. She appears well-developed and well-nourished.  Obese  HENT:  Head: Normocephalic and atraumatic.  Eyes: EOM are normal.  Neck: Normal range of motion.  Cardiovascular: Normal rate and regular rhythm.   Pulmonary/Chest: Effort normal and breath sounds normal. No  respiratory distress. She has no wheezes. She has no rales.  Abdominal: Soft. Bowel sounds are normal. She exhibits no distension. There is no rebound and no guarding.  Neurological: She is alert and oriented to person, place, and time. Coordination normal.  Skin: Skin is warm and dry.  See foot exam   Filed Vitals:   09/09/15 0852  BP: 130/88  Pulse: 78  Temp: 98.5 F (36.9 C)  TempSrc: Oral  Resp: 14  Height: 5' 5.5" (1.664 m)  Weight: 240 lb (108.863 kg)  SpO2: 98%      Assessment & Plan:

## 2015-09-10 ENCOUNTER — Ambulatory Visit: Payer: 59 | Admitting: Physical Therapy

## 2015-09-10 DIAGNOSIS — M25512 Pain in left shoulder: Secondary | ICD-10-CM | POA: Diagnosis not present

## 2015-09-10 DIAGNOSIS — M25612 Stiffness of left shoulder, not elsewhere classified: Secondary | ICD-10-CM

## 2015-09-10 DIAGNOSIS — R29898 Other symptoms and signs involving the musculoskeletal system: Secondary | ICD-10-CM | POA: Diagnosis not present

## 2015-09-10 NOTE — Therapy (Signed)
[place], [place], [address] Phone: [phone]   Fax:  [phone]  Physical Therapy Treatment  Patient Details  Name: Bridget [name] MRN: [id number] Date of Birth: 02-19-1961 Referring Provider: Grier Mitts PA  Encounter Date: 09/10/2015      PT End of Session - 09/10/15 1222    Visit Number 10   Number of Visits 16   Date for PT Re-Evaluation 09/30/15   PT Start Time 1016   PT Stop Time 1100   PT Time Calculation (min) 44 min   Activity Tolerance Patient tolerated treatment well;No increased pain   Behavior During Therapy [facility] for tasks assessed/performed      Past Medical History  Diagnosis Date  . Diabetes mellitus without complication ([place])   . Osteoarthritis   . GERD (gastroesophageal reflux disease)   . Diverticulosis   . Glaucoma   . Obesity   . Vaginal yeast infection 12/08/2010  . SHOULDER PAIN, LEFT 05/19/2010    Qualifier: Diagnosis of  By: Annamary Carolin MD, Amber    . Retained tampon 05/21/2012  . OSTEOARTHRITIS, KNEE 04/27/2010  . OBESITY, NOS 10/26/2006  . MALAISE AND FATIGUE 05/27/2010  . HEEL SPUR 09/10/2007    Qualifier: Diagnosis of  By: Drue Flirt  MD, Merrily Brittle    . DIVERTICULOSIS, COLON 10/26/2006  . Bacterial vaginosis 12/07/2010  . Hypertension   . Depression   . Headache   . Fibromyalgia     Past Surgical History  Procedure Laterality Date  . Abdominal hysterectomy  1990  . Bladder surgery  2001  . Cataract extraction Right 2001    with implant  . Abdominal hysterectomy    . Eye surgery      implant rt eye  . Rotator cuff repair Right   . Shoulder arthroscopy with subacromial decompression Left 07/20/2015    Procedure: SHOULDER ARTHROSCOPY WITH DEBRIDEMENT OF ROTATOR CUFF AND SUBACROMIAL DECOMPRESSION ;  Surgeon: Tania Ade, MD;  Location: [facility];  Service: Orthopedics;  Laterality: Left;  Left shoulder arthroscopy with debridement of  rotator cuff and subacromial decompression    There were no vitals filed for this visit.  Visit Diagnosis:  Pain in joint of left shoulder  Shoulder stiffness, left  Shoulder weakness      Subjective Assessment - 09/10/15 1213    Subjective Going to [facility] for follow up.  Rheumatologist is sending her.  6/10 today.  Sore and tired.  PT is helping.   Currently in Pain? Yes   Pain Score 6    Pain Location Shoulder   Pain Orientation Left   Pain Descriptors / Indicators --  pain   Aggravating Factors  cold weather   Pain Relieving Factors massage, TENS            [facility] PT Assessment - 09/10/15 1030    AROM   Left Shoulder Flexion 145 Degrees   Left Shoulder ABduction 123 Degrees   Left Shoulder External Rotation 56 Degrees                     [facility] Adult PT Treatment/Exercise - 09/10/15 0001    Self-Care   Self-Care --  how to get TENS electrodes, do more exercises at homeetc   Manual Therapy   Manual Therapy Soft tissue mobilization;Other (comment)   Manual therapy comments taping, 2 to inhibit deltoid/ supraspinatue 1 to correct posture.     Soft tissue mobilization instrument assist.  deltoid trap shoulder.  PT Short Term Goals - 09/10/15 1224    PT SHORT TERM GOAL #1   Title Patient will demonstrate basic self care for AAROM/AROM, ice for pain control   09/02/15   Time 4   Period Weeks   Status Achieved   PT SHORT TERM GOAL #2   Title The patient will have 90 degrees of shoulder flexion/elevation needed for self care grooming, dressing tasks and driving   Time 4   Period Weeks   Status Achieved   PT SHORT TERM GOAL #3   Title The patient will have improved ER ROM to 55 degrees needed for turning the steering wheel.   Time 4   Period Weeks   Status Achieved   PT SHORT TERM GOAL #4   Title Pain intensity improved by 25% with usual ADLs   Time 4   Period Weeks   Status Achieved           PT Long Term Goals -  09/10/15 1225    PT LONG TERM GOAL #1   Title The patient will be independent with safe self progression of HEP for further ROM and strengthening   09/30/15   Time 8   Period Weeks   Status On-going   PT LONG TERM GOAL #2   Title The patient will have 125 degrees of left shoulder flexion/elevation needed for reaching the switchboard at work Engineer, manufacturing systems-  code buttons)   Time 8   Period Weeks   Status Achieved   PT LONG TERM GOAL #3   Title The patient will have grossly 4/5 left glenohumeral and scapular strength needed for lifting code binders at work   Baseline 4-/5 to 4/5   Time 8   Period Weeks   Status On-going   PT LONG TERM GOAL #4   Title The patient will have improved IR and ER to 60 degrees needed for household chores and driving   Time 8   Period Weeks   Status On-going   PT LONG TERM GOAL #5   Title FOTO functional outcome score improved to 38% indicating improved function with less pain   Baseline 73% limitation   Time 8   Period Weeks   Status On-going               Plan - 09/10/15 1222    Clinical Impression Statement ER goal met.  Patient is frustrated with her rheumatologist and she is thinking about disability.   FOTO score worse today.     PT Next Visit Plan strengthening.  assess tape,  encourage home exercises.   PT Home Exercise Plan continue   Consulted and Agree with Plan of Care Patient        Problem List Patient Active Problem List   Diagnosis Date Noted  . GERD (gastroesophageal reflux disease) 09/09/2014  . Other and unspecified hyperlipidemia 05/24/2013  . Lung nodule seen on imaging study 09/21/2012  . Chronic pain of multiple sites - Knees, Ankles, Back 11/09/2011  . Lupus (systemic lupus erythematosus) ([place]) 06/28/2011  . HYPOTHYROIDISM 04/27/2010  . Depression 04/27/2010  . OSTEOARTHRITIS, KNEE 04/27/2010  . Diabetes mellitus type 2, uncontrolled, with complications ([place]) 62/37/6283  . Obesity 10/26/2006  .  HYPERTENSION, BENIGN SYSTEMIC 10/26/2006  . VENOUS INSUFFICIENCY, CHRONIC 10/26/2006    [facility] 09/10/2015, 12:29 PM  [facility] [address] [place], [place], [address] Phone: [phone]   Fax:  [phone]  Name: Bridget [name] MRN: [id number] Date of  Birth: 01/18/1961    .Melvenia Needles, PTA 09/10/2015 12:29 PM Phone: [phone] Fax: [phone]

## 2015-09-11 ENCOUNTER — Encounter: Payer: Self-pay | Admitting: Internal Medicine

## 2015-09-11 NOTE — Assessment & Plan Note (Addendum)
BP at goal with lisinopril, recent labs without indication for change.

## 2015-09-11 NOTE — Assessment & Plan Note (Signed)
Not able to tolerate metformin, on amaryl and invokana and [place]. The invokana does give her recurrent yeast infections which are not treatable. Will stop since this is limiting how much she is taking any of her medicines. Foot exam done today and checking HgA1c and adjust as needed. She is on statin and ACE-I without problems.

## 2015-09-11 NOTE — Assessment & Plan Note (Signed)
Checking cholesterol and adjust as needed. Lipitor 40 mg daily.

## 2015-09-14 ENCOUNTER — Other Ambulatory Visit: Payer: Self-pay | Admitting: Internal Medicine

## 2015-09-14 MED ORDER — EXENATIDE ER 2 MG ~~LOC~~ PEN: 2.0000 mg | PEN_INJECTOR | SUBCUTANEOUS | Status: DC

## 2015-09-14 MED FILL — BYDUREON 2 MG PEN INJECT: 2 | 28 days supply | Qty: 4 | Fill #0

## 2015-09-15 ENCOUNTER — Ambulatory Visit: Payer: 59 | Admitting: Physical Therapy

## 2015-09-16 ENCOUNTER — Ambulatory Visit: Payer: 59 | Admitting: Physical Therapy

## 2015-09-16 DIAGNOSIS — R29898 Other symptoms and signs involving the musculoskeletal system: Secondary | ICD-10-CM | POA: Diagnosis not present

## 2015-09-16 DIAGNOSIS — Z9889 Other specified postprocedural states: Secondary | ICD-10-CM | POA: Diagnosis not present

## 2015-09-16 DIAGNOSIS — M25612 Stiffness of left shoulder, not elsewhere classified: Secondary | ICD-10-CM

## 2015-09-16 DIAGNOSIS — M25512 Pain in left shoulder: Secondary | ICD-10-CM

## 2015-09-16 NOTE — Therapy (Signed)
[facility] [address] [place], [place], [address] Phone: [phone]   Fax:  [phone]  Physical Therapy Treatment  Patient Details  Name: Bridget [name] MRN: [id number] Date of Birth: 1961/08/13 Referring Provider: Jiles Harold PA  Encounter Date: 09/16/2015      PT End of Session - 09/16/15 1257    Visit Number 11   Number of Visits 16   Date for PT Re-Evaluation 09/30/15   Authorization Type UMR   PT Start Time 1102   PT Stop Time 1155   PT Time Calculation (min) 53 min      Past Medical History  Diagnosis Date  . Diabetes mellitus without complication (HCC)   . Osteoarthritis   . GERD (gastroesophageal reflux disease)   . Diverticulosis   . Glaucoma   . Obesity   . Vaginal yeast infection 12/08/2010  . SHOULDER PAIN, LEFT 05/19/2010    Qualifier: Diagnosis of  By: Clotilde Dieter MD, Amber    . Retained tampon 05/21/2012  . OSTEOARTHRITIS, KNEE 04/27/2010  . OBESITY, NOS 10/26/2006  . MALAISE AND FATIGUE 05/27/2010  . HEEL SPUR 09/10/2007    Qualifier: Diagnosis of  By: Lanier Prude  MD, Cathrine Muster    . DIVERTICULOSIS, COLON 10/26/2006  . Bacterial vaginosis 12/07/2010  . Hypertension   . Depression   . Headache   . Fibromyalgia     Past Surgical History  Procedure Laterality Date  . Abdominal hysterectomy  1990  . Bladder surgery  2001  . Cataract extraction Right 2001    with implant  . Abdominal hysterectomy    . Eye surgery      implant rt eye  . Rotator cuff repair Right   . Shoulder arthroscopy with subacromial decompression Left 07/20/2015    Procedure: SHOULDER ARTHROSCOPY WITH DEBRIDEMENT OF ROTATOR CUFF AND SUBACROMIAL DECOMPRESSION ;  Surgeon: Jones Broom, MD;  Location: [facility];  Service: Orthopedics;  Laterality: Left;  Left shoulder arthroscopy with debridement of rotator cuff and subacromial decompression    There were no vitals filed for this visit.  Visit Diagnosis:   Pain in joint of left shoulder  Shoulder stiffness, left  Shoulder weakness      Subjective Assessment - 09/16/15 1100    Subjective 5/10 before putting TENS on shoulder. Saw surgeon this morning. He said continue PT- f/u in a month.    Currently in Pain? Yes   Pain Score 5    Pain Location Shoulder                         [facility] Adult PT Treatment/Exercise - 09/16/15 1110    Shoulder Exercises: Supine   Horizontal ABduction 15 reps   Theraband Level (Shoulder Horizontal ABduction) Level 2 (Red)   Shoulder Exercises: Seated   External Rotation 15 reps   Theraband Level (Shoulder External Rotation) Level 2 (Red)   Shoulder Exercises: Sidelying   External Rotation Strengthening;Left;Weights;15 reps   External Rotation Weight (lbs) 2   ABduction Strengthening;Left;Weights;15 reps   ABduction Weight (lbs) 2   Shoulder Exercises: Standing   External Rotation Strengthening   Theraband Level (Shoulder External Rotation) Level 2 (Red)   Other Standing Exercises UE Ranger- flexion, horizontal abduction,   L 17   Shoulder Exercises: ROM/Strengthening   UBE (Upper Arm Bike) L1 2 min forward, 2 min back   Cryotherapy   Number Minutes Cryotherapy 10 Minutes   Cryotherapy Location Shoulder   Type of  Cryotherapy Ice pack                  PT Short Term Goals - 09/10/15 1224    PT SHORT TERM GOAL #1   Title Patient will demonstrate basic self care for AAROM/AROM, ice for pain control   09/02/15   Time 4   Period Weeks   Status Achieved   PT SHORT TERM GOAL #2   Title The patient will have 90 degrees of shoulder flexion/elevation needed for self care grooming, dressing tasks and driving   Time 4   Period Weeks   Status Achieved   PT SHORT TERM GOAL #3   Title The patient will have improved ER ROM to 55 degrees needed for turning the steering wheel.   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)   Time 8   Period Weeks   Status Achieved   PT LONG TERM GOAL #3   Title The patient will have grossly 4/5 left glenohumeral and scapular strength needed for lifting code binders at work   Baseline 4-/5 to 4/5   Time 8   Period Weeks   Status On-going   PT LONG TERM GOAL #4   Title The patient will have improved IR and ER to 60 degrees needed for household chores and driving   Time 8   Period Weeks   Status On-going   PT LONG TERM GOAL #5   Title FOTO functional outcome score improved to 38% indicating improved function with less pain   Baseline 73% limitation   Time 8   Period Weeks   Status On-going               Plan - 09/16/15 1104    Clinical Impression Statement Pt reports continued difficulty with carrying groccery bags in LUE as well as difficulty with hair care and reaching behind back. Worked on strength and motion today with good tolerance. Pt wearing TENS entire treatment. Pt reports she does her HEP every day that she does not come to PT.    PT Next Visit Plan strengthening.  assess tape,  encourage home exercises.        Problem List Patient Active Problem List   Diagnosis Date Noted  . GERD (gastroesophageal reflux disease) 09/09/2014  . Hyperlipidemia 05/24/2013  . Lung nodule seen on imaging study 09/21/2012  . Chronic pain of multiple sites - Knees, Ankles, Back 11/09/2011  . Lupus (systemic lupus erythematosus) (HCC) 06/28/2011  . HYPOTHYROIDISM 04/27/2010  . Depression  04/27/2010  . OSTEOARTHRITIS, KNEE 04/27/2010  . Diabetes mellitus type 2, uncontrolled, with complications (HCC) 08/14/2007  . Obesity 10/26/2006  . HYPERTENSION, BENIGN SYSTEMIC 10/26/2006  . VENOUS INSUFFICIENCY, CHRONIC 10/26/2006    Sherrie Mustache, PTA 09/16/2015, 12:59 PM  [facility] [address] [place], [place], [address] Phone: [phone]   Fax:  [phone]  Name: Bridget [name] MRN: [id number] Date of Birth: 06-Apr-1961

## 2015-09-17 ENCOUNTER — Encounter: Payer: 59 | Admitting: Physical Therapy

## 2015-09-18 ENCOUNTER — Ambulatory Visit: Payer: 59 | Admitting: Physical Therapy

## 2015-09-18 DIAGNOSIS — M25512 Pain in left shoulder: Secondary | ICD-10-CM

## 2015-09-18 DIAGNOSIS — R29898 Other symptoms and signs involving the musculoskeletal system: Secondary | ICD-10-CM

## 2015-09-18 DIAGNOSIS — M25612 Stiffness of left shoulder, not elsewhere classified: Secondary | ICD-10-CM

## 2015-09-18 NOTE — Therapy (Signed)
[facility] [address] [place], [place], [address] Phone: [phone]   Fax:  [phone]  Physical Therapy Treatment  Patient Details  Name: Bridget [name] MRN: [id number] Date of Birth: 1961-01-30 Referring Provider: Jiles Harold PA  Encounter Date: 09/18/2015      PT End of Session - 09/18/15 1045    Visit Number 12   Number of Visits 16   Date for PT Re-Evaluation 09/30/15   Authorization Type UMR   PT Start Time 1005   PT Stop Time 1046   PT Time Calculation (min) 41 min   Activity Tolerance Patient tolerated treatment well;No increased pain   Behavior During Therapy [facility] for tasks assessed/performed      Past Medical History  Diagnosis Date  . Diabetes mellitus without complication (HCC)   . Osteoarthritis   . GERD (gastroesophageal reflux disease)   . Diverticulosis   . Glaucoma   . Obesity   . Vaginal yeast infection 12/08/2010  . SHOULDER PAIN, LEFT 05/19/2010    Qualifier: Diagnosis of  By: Clotilde Dieter MD, Amber    . Retained tampon 05/21/2012  . OSTEOARTHRITIS, KNEE 04/27/2010  . OBESITY, NOS 10/26/2006  . MALAISE AND FATIGUE 05/27/2010  . HEEL SPUR 09/10/2007    Qualifier: Diagnosis of  By: Lanier Prude  MD, Cathrine Muster    . DIVERTICULOSIS, COLON 10/26/2006  . Bacterial vaginosis 12/07/2010  . Hypertension   . Depression   . Headache   . Fibromyalgia     Past Surgical History  Procedure Laterality Date  . Abdominal hysterectomy  1990  . Bladder surgery  2001  . Cataract extraction Right 2001    with implant  . Abdominal hysterectomy    . Eye surgery      implant rt eye  . Rotator cuff repair Right   . Shoulder arthroscopy with subacromial decompression Left 07/20/2015    Procedure: SHOULDER ARTHROSCOPY WITH DEBRIDEMENT OF ROTATOR CUFF AND SUBACROMIAL DECOMPRESSION ;  Surgeon: Jones Broom, MD;  Location: [facility];  Service: Orthopedics;  Laterality: Left;  Left shoulder arthroscopy  with debridement of rotator cuff and subacromial decompression    There were no vitals filed for this visit.  Visit Diagnosis:  Pain in joint of left shoulder  Shoulder stiffness, left  Shoulder weakness      Subjective Assessment - 09/18/15 1008    Subjective Hurting today; everything hurts though due to the rain.  Wearing TENS unit today.     Pertinent History fibromyalgia   Limitations House hold activities   Patient Stated Goals do my hair, lift arm overhead   Currently in Pain? Yes   Pain Score 4    Pain Location Shoulder   Pain Orientation Left   Pain Descriptors / Indicators Sore   Pain Type Surgical pain   Pain Onset More than a month ago   Pain Frequency Constant   Aggravating Factors  cold weather/rain   Pain Relieving Factors massage, TENS                         [facility] Adult PT Treatment/Exercise - 09/18/15 1010    Shoulder Exercises: Supine   Protraction Left;15 reps;Weights   Protraction Weight (lbs) 4   Horizontal ABduction 15 reps   Theraband Level (Shoulder Horizontal ABduction) Level 3 (Green)   External Rotation Left;15 reps;Theraband   Theraband Level (Shoulder External Rotation) Level 2 (Red)   Flexion Left;15 reps;Theraband   Theraband Level (Shoulder  Flexion) Level 3 (Green)   Shoulder Exercises: Standing   Protraction Strengthening;Left;15 reps;Theraband   Theraband Level (Shoulder Protraction) Level 3 (Green)   Extension Strengthening;Left;15 reps;Theraband   Theraband Level (Shoulder Extension) Level 3 (Green)   Retraction Both;15 reps;Theraband   Theraband Level (Shoulder Retraction) Level 3 (Green)   Other Standing Exercises UE Ranger- flexion, horizontal abduction, abduction x15 1# wrist weight   Shoulder Exercises: ROM/Strengthening   UBE (Upper Arm Bike) L1 x 6 min; 3 min forward/3 min backward                  PT Short Term Goals - 09/10/15 1224    PT SHORT TERM GOAL #1   Title Patient will demonstrate  basic self care for AAROM/AROM, ice for pain control   09/02/15   Time 4   Period Weeks   Status Achieved   PT SHORT TERM GOAL #2   Title The patient will have 90 degrees of shoulder flexion/elevation needed for self care grooming, dressing tasks and driving   Time 4   Period Weeks   Status Achieved   PT SHORT TERM GOAL #3   Title The patient will have improved ER ROM to 55 degrees needed for turning the steering wheel.   Time 4   Period Weeks   Status Achieved   PT SHORT TERM GOAL #4   Title Pain intensity improved by 25% with usual ADLs   Time 4   Period Weeks   Status Achieved           PT Long Term Goals - 09/10/15 1225    PT LONG TERM GOAL #1   Title The patient will be independent with safe self progression of HEP for further ROM and strengthening   09/30/15   Time 8   Period Weeks   Status On-going   PT LONG TERM GOAL #2   Title The patient will have 125 degrees of left shoulder flexion/elevation needed for reaching the switchboard at work Orthoptist-  code buttons)   Time 8   Period Weeks   Status Achieved   PT LONG TERM GOAL #3   Title The patient will have grossly 4/5 left glenohumeral and scapular strength needed for lifting code binders at work   Baseline 4-/5 to 4/5   Time 8   Period Weeks   Status On-going   PT LONG TERM GOAL #4   Title The patient will have improved IR and ER to 60 degrees needed for household chores and driving   Time 8   Period Weeks   Status On-going   PT LONG TERM GOAL #5   Title FOTO functional outcome score improved to 38% indicating improved function with less pain   Baseline 73% limitation   Time 8   Period Weeks   Status On-going               Plan - 09/18/15 1045    Clinical Impression Statement Pt tolerated strengthening exercises well despite reports of pain at beginning of session.  Will continue to benefit from PT to maximize function and strength.   PT Next Visit Plan strengthening. encourage home  exercises.   PT Home Exercise Plan continue   Consulted and Agree with Plan of Care Patient        Problem List Patient Active Problem List   Diagnosis Date Noted  . GERD (gastroesophageal reflux disease) 09/09/2014  . Hyperlipidemia 05/24/2013  . Lung nodule seen on imaging study 09/21/2012  .  Chronic pain of multiple sites - Knees, Ankles, Back 11/09/2011  . Lupus (systemic lupus erythematosus) (HCC) 06/28/2011  . HYPOTHYROIDISM 04/27/2010  . Depression 04/27/2010  . OSTEOARTHRITIS, KNEE 04/27/2010  . Diabetes mellitus type 2, uncontrolled, with complications (HCC) 08/14/2007  . Obesity 10/26/2006  . HYPERTENSION, BENIGN SYSTEMIC 10/26/2006  . VENOUS INSUFFICIENCY, CHRONIC 10/26/2006   Clarita Crane, PT, DPT 09/18/2015 10:47 AM  [facility] [address] [place], [place], [address] Phone: [phone]   Fax:  [phone]  Name: Bridget [name] MRN: [id number] Date of Birth: 12/14/1960

## 2015-09-22 ENCOUNTER — Ambulatory Visit: Payer: 59 | Admitting: Physical Therapy

## 2015-09-22 DIAGNOSIS — R29898 Other symptoms and signs involving the musculoskeletal system: Secondary | ICD-10-CM

## 2015-09-22 DIAGNOSIS — M25612 Stiffness of left shoulder, not elsewhere classified: Secondary | ICD-10-CM

## 2015-09-22 DIAGNOSIS — M25512 Pain in left shoulder: Secondary | ICD-10-CM | POA: Diagnosis not present

## 2015-09-22 NOTE — Therapy (Signed)
[place], [place], [address] Phone: [phone]   Fax:  [phone]  Physical Therapy Treatment  Patient Details  Name: Bridget [name] MRN: [id number] Date of Birth: 01/19/1961 Referring Provider: Grier Mitts PA  Encounter Date: 09/22/2015      PT End of Session - 09/22/15 1433    Visit Number 13   Number of Visits 16   Date for PT Re-Evaluation 09/30/15   PT Start Time 1019   PT Stop Time 1102   PT Time Calculation (min) 43 min   Activity Tolerance Patient tolerated treatment well;No increased pain   Behavior During Therapy [facility] for tasks assessed/performed      Past Medical History  Diagnosis Date  . Diabetes mellitus without complication ([place])   . Osteoarthritis   . GERD (gastroesophageal reflux disease)   . Diverticulosis   . Glaucoma   . Obesity   . Vaginal yeast infection 12/08/2010  . SHOULDER PAIN, LEFT 05/19/2010    Qualifier: Diagnosis of  By: Annamary Carolin MD, Amber    . Retained tampon 05/21/2012  . OSTEOARTHRITIS, KNEE 04/27/2010  . OBESITY, NOS 10/26/2006  . MALAISE AND FATIGUE 05/27/2010  . HEEL SPUR 09/10/2007    Qualifier: Diagnosis of  By: Drue Flirt  MD, Merrily Brittle    . DIVERTICULOSIS, COLON 10/26/2006  . Bacterial vaginosis 12/07/2010  . Hypertension   . Depression   . Headache   . Fibromyalgia     Past Surgical History  Procedure Laterality Date  . Abdominal hysterectomy  1990  . Bladder surgery  2001  . Cataract extraction Right 2001    with implant  . Abdominal hysterectomy    . Eye surgery      implant rt eye  . Rotator cuff repair Right   . Shoulder arthroscopy with subacromial decompression Left 07/20/2015    Procedure: SHOULDER ARTHROSCOPY WITH DEBRIDEMENT OF ROTATOR CUFF AND SUBACROMIAL DECOMPRESSION ;  Surgeon: Tania Ade, MD;  Location: [place];  Service: Orthopedics;  Laterality: Left;  Left shoulder arthroscopy with debridement of  rotator cuff and subacromial decompression    There were no vitals filed for this visit.  Visit Diagnosis:  Shoulder stiffness, left  Pain in joint of left shoulder  Shoulder weakness      Subjective Assessment - 09/22/15 1020    Subjective 3/10.  Sore.   Currently in Pain? Yes   Pain Score 3    Pain Location Shoulder   Pain Orientation Left   Aggravating Factors  weather   Pain Relieving Factors Tens                         [facility] Adult PT Treatment/Exercise - 09/22/15 1021    Shoulder Exercises: Standing   Theraband Level (Shoulder Protraction) Level 4 (Blue)   Theraband Level (Shoulder External Rotation) Level 3 (Green)  L 4 too hard. towel roll used   Theraband Level (Shoulder Internal Rotation) Level 3 (Green)  20 X fatigue   Flexion Limitations 2 LBS elbow flexion then reach 10 X 2 LBS   Row Strengthening   Theraband Level (Shoulder Row) Level 3 (Green);Level 4 (Blue)   Row Limitations 20 X each color band  cues scapular use initially   Other Standing Exercises UE ranger 20 X flexion.  Horizontal abduction 15 X 2 lbs   Manual Therapy   Manual therapy comments soft tissue, scar mobilization,  PROM, taping as on 1/12 /17  PT Short Term Goals - 09/10/15 1224    PT SHORT TERM GOAL #1   Title Patient will demonstrate basic self care for AAROM/AROM, ice for pain control   09/02/15   Time 4   Period Weeks   Status Achieved   PT SHORT TERM GOAL #2   Title The patient will have 90 degrees of shoulder flexion/elevation needed for self care grooming, dressing tasks and driving   Time 4   Period Weeks   Status Achieved   PT SHORT TERM GOAL #3   Title The patient will have improved ER ROM to 55 degrees needed for turning the steering wheel.   Time 4   Period Weeks   Status Achieved   PT SHORT TERM GOAL #4   Title Pain intensity improved by 25% with usual ADLs   Time 4   Period Weeks   Status Achieved           PT  Long Term Goals - 09/22/15 1436    PT LONG TERM GOAL #1   Title The patient will be independent with safe self progression of HEP for further ROM and strengthening   09/30/15   Time 8   Period Weeks   Status On-going   PT LONG TERM GOAL #2   Title The patient will have 125 degrees of left shoulder flexion/elevation needed for reaching the switchboard at work Engineer, manufacturing systems-  code buttons)   Time 8   Period Weeks   Status Achieved   PT LONG TERM GOAL #3   Title The patient will have grossly 4/5 left glenohumeral and scapular strength needed for lifting code binders at work   Time 8   Period Weeks   Status On-going   PT LONG TERM GOAL #4   Title The patient will have improved IR and ER to 60 degrees needed for household chores and driving   Baseline 75 degrees   Time 8   Period Weeks   Status Achieved   PT LONG TERM GOAL #5   Title FOTO functional outcome score improved to 38% indicating improved function with less pain   Time 8   Period Weeks   Status On-going               Plan - 09/22/15 1433    Clinical Impression Statement Able to progress row band to blue, all other [place], green OK for now.  Able to use Less use of TENS with use of TAPE.  It lasted 2 weeks.  I used the TENS 1 X in those 2 weeks.  No new goals met.     PT Next Visit Plan strengthening. encourage home exercises.   PT Home Exercise Plan continue   Consulted and Agree with Plan of Care Patient        Problem List Patient Active Problem List   Diagnosis Date Noted  . GERD (gastroesophageal reflux disease) 09/09/2014  . Hyperlipidemia 05/24/2013  . Lung nodule seen on imaging study 09/21/2012  . Chronic pain of multiple sites - Knees, Ankles, Back 11/09/2011  . Lupus (systemic lupus erythematosus) ([place]) 06/28/2011  . HYPOTHYROIDISM 04/27/2010  . Depression 04/27/2010  . OSTEOARTHRITIS, KNEE 04/27/2010  . Diabetes mellitus type 2, uncontrolled, with complications ([place]) 73/53/2992  .  Obesity 10/26/2006  . HYPERTENSION, BENIGN SYSTEMIC 10/26/2006  . VENOUS INSUFFICIENCY, CHRONIC 10/26/2006    HARRIS,KAREN 09/22/2015, 2:40 PM  [facility] [address] [place], [place], [address] Phone: [phone]  Fax:  [phone]  Name: Bridget [name] MRN: [id number] Date of Birth: 04-10-1961    Melvenia Needles, PTA 09/22/2015 2:40 PM Phone: [phone] Fax: [phone]

## 2015-09-24 ENCOUNTER — Ambulatory Visit: Payer: 59 | Admitting: Physical Therapy

## 2015-09-24 DIAGNOSIS — M25512 Pain in left shoulder: Secondary | ICD-10-CM

## 2015-09-24 DIAGNOSIS — M25612 Stiffness of left shoulder, not elsewhere classified: Secondary | ICD-10-CM | POA: Diagnosis not present

## 2015-09-24 DIAGNOSIS — R29898 Other symptoms and signs involving the musculoskeletal system: Secondary | ICD-10-CM

## 2015-09-24 NOTE — Therapy (Signed)
[facility] [address] [place], [place], [address] Phone: [phone]   Fax:  [phone]  Physical Therapy Treatment  Patient Details  Name: ROSALIE BUENAVENTURA MRN: [id number] Date of Birth: 09-Jan-1961 Referring Provider: Jiles Harold PA  Encounter Date: 09/24/2015      PT End of Session - 09/24/15 1436    Visit Number 14   Number of Visits 16   Date for PT Re-Evaluation 09/30/15   PT Start Time 1019   PT Stop Time 1102   PT Time Calculation (min) 43 min   Activity Tolerance Patient tolerated treatment well   Behavior During Therapy [facility] for tasks assessed/performed      Past Medical History  Diagnosis Date  . Diabetes mellitus without complication (HCC)   . Osteoarthritis   . GERD (gastroesophageal reflux disease)   . Diverticulosis   . Glaucoma   . Obesity   . Vaginal yeast infection 12/08/2010  . SHOULDER PAIN, LEFT 05/19/2010    Qualifier: Diagnosis of  By: Clotilde Dieter MD, Amber    . Retained tampon 05/21/2012  . OSTEOARTHRITIS, KNEE 04/27/2010  . OBESITY, NOS 10/26/2006  . MALAISE AND FATIGUE 05/27/2010  . HEEL SPUR 09/10/2007    Qualifier: Diagnosis of  By: Lanier Prude  MD, Cathrine Muster    . DIVERTICULOSIS, COLON 10/26/2006  . Bacterial vaginosis 12/07/2010  . Hypertension   . Depression   . Headache   . Fibromyalgia     Past Surgical History  Procedure Laterality Date  . Abdominal hysterectomy  1990  . Bladder surgery  2001  . Cataract extraction Right 2001    with implant  . Abdominal hysterectomy    . Eye surgery      implant rt eye  . Rotator cuff repair Right   . Shoulder arthroscopy with subacromial decompression Left 07/20/2015    Procedure: SHOULDER ARTHROSCOPY WITH DEBRIDEMENT OF ROTATOR CUFF AND SUBACROMIAL DECOMPRESSION ;  Surgeon: Jones Broom, MD;  Location: [facility];  Service: Orthopedics;  Laterality: Left;  Left shoulder arthroscopy with debridement of rotator cuff and  subacromial decompression    There were no vitals filed for this visit.  Visit Diagnosis:  Shoulder stiffness, left  Pain in joint of left shoulder  Shoulder weakness      Subjective Assessment - 09/24/15 1021    Subjective I'm sleepy .  Im working on my strength.    Currently in Pain? Yes   Pain Score 3    Pain Location Shoulder   Pain Orientation Left   Pain Descriptors / Indicators Sore                         [facility] Adult PT Treatment/Exercise - 09/24/15 1033    Therapeutic Activites    Therapeutic Activities Lifting;Work Counselling psychologist   Work Higher education careers adviser reaching binder,  with wide grip.  weights 5, 10 kettle balls to simulate.     Shoulder Exercises: Supine   Other Supine Exercises triceps 4 LBS 10 X 2 sets.     Shoulder Exercises: Seated   Other Seated Exercises biceps 3 sets of 10, 7 LBS   Shoulder Exercises: Prone   Extension Strengthening  7 Lbs 15 X 2row,  extension 15 X 2 0 LBS.   Shoulder Exercises: Standing   External Rotation Strengthening  3 LBS cable cross with tpowel roll   Internal Rotation 10 reps  3 sets, with towel   Internal Rotation Weight (lbs) 3  Shoulder Exercises: ROM/Strengthening   UBE (Upper Arm Bike) Nustep L5 7 minutes                  PT Short Term Goals - 09/10/15 1224    PT SHORT TERM GOAL #1   Title Patient will demonstrate basic self care for AAROM/AROM, ice for pain control   09/02/15   Time 4   Period Weeks   Status Achieved   PT SHORT TERM GOAL #2   Title The patient will have 90 degrees of shoulder flexion/elevation needed for self care grooming, dressing tasks and driving   Time 4   Period Weeks   Status Achieved   PT SHORT TERM GOAL #3   Title The patient will have improved ER ROM to 55 degrees needed for turning the steering wheel.   Time 4   Period Weeks   Status Achieved   PT SHORT TERM GOAL #4   Title Pain intensity improved by 25% with usual ADLs   Time 4   Period Weeks    Status Achieved           PT Long Term Goals - 09/22/15 1436    PT LONG TERM GOAL #1   Title The patient will be independent with safe self progression of HEP for further ROM and strengthening   09/30/15   Time 8   Period Weeks   Status On-going   PT LONG TERM GOAL #2   Title The patient will have 125 degrees of left shoulder flexion/elevation needed for reaching the switchboard at work Orthoptist-  code buttons)   Time 8   Period Weeks   Status Achieved   PT LONG TERM GOAL #3   Title The patient will have grossly 4/5 left glenohumeral and scapular strength needed for lifting code binders at work   Time 8   Period Weeks   Status On-going   PT LONG TERM GOAL #4   Title The patient will have improved IR and ER to 60 degrees needed for household chores and driving   Baseline 75 degrees   Time 8   Period Weeks   Status Achieved   PT LONG TERM GOAL #5   Title FOTO functional outcome score improved to 38% indicating improved function with less pain   Time 8   Period Weeks   Status On-going               Plan - 09/24/15 1437    Clinical Impression Statement Post session sore and tired.  Pain unchanged.  Declined modalities.  Strengthening focus.   PT Next Visit Plan Patient to bring in her job items to simulate work tasks. POC ends soon need to check.    PT Home Exercise Plan continue   Consulted and Agree with Plan of Care Patient        Problem List Patient Active Problem List   Diagnosis Date Noted  . GERD (gastroesophageal reflux disease) 09/09/2014  . Hyperlipidemia 05/24/2013  . Lung nodule seen on imaging study 09/21/2012  . Chronic pain of multiple sites - Knees, Ankles, Back 11/09/2011  . Lupus (systemic lupus erythematosus) (HCC) 06/28/2011  . HYPOTHYROIDISM 04/27/2010  . Depression 04/27/2010  . OSTEOARTHRITIS, KNEE 04/27/2010  . Diabetes mellitus type 2, uncontrolled, with complications (HCC) 08/14/2007  . Obesity 10/26/2006  .  HYPERTENSION, BENIGN SYSTEMIC 10/26/2006  . VENOUS INSUFFICIENCY, CHRONIC 10/26/2006    HARRIS,KAREN 09/24/2015, 2:40 PM  [facility] [address]  [address] [place], [place], [address] Phone: [phone]   Fax:  [phone]  Name: TAMBERLY POMPLUN MRN: [id number] Date of Birth: Jan 13, 1961    Liz Beach, PTA 09/24/2015 2:40 PM Phone: [phone] Fax: [phone]

## 2015-09-28 ENCOUNTER — Ambulatory Visit: Payer: 59 | Admitting: Physical Therapy

## 2015-09-28 DIAGNOSIS — M25512 Pain in left shoulder: Secondary | ICD-10-CM | POA: Diagnosis not present

## 2015-09-28 DIAGNOSIS — R29898 Other symptoms and signs involving the musculoskeletal system: Secondary | ICD-10-CM | POA: Diagnosis not present

## 2015-09-28 DIAGNOSIS — M25612 Stiffness of left shoulder, not elsewhere classified: Secondary | ICD-10-CM

## 2015-09-28 NOTE — Therapy (Signed)
[facility] Out[facility] Rehabilitation [facility] [address] [place], [place], [address] Phone: [phone]   Fax:  [phone]  Physical Therapy Treatment  Patient Details  Name: MILLIANA REDDOCH MRN: [id number] Date of Birth: June 01, 1961 Referring Provider: Jiles Harold PA  Encounter Date: 09/28/2015      PT End of Session - 09/28/15 1121    Visit Number 15   Number of Visits 16   Date for PT Re-Evaluation 09/30/15   PT Start Time 1018   PT Stop Time 1100   PT Time Calculation (min) 42 min   Activity Tolerance Patient tolerated treatment well   Behavior During Therapy [facility] for tasks assessed/performed      Past Medical History  Diagnosis Date  . Diabetes mellitus without complication (HCC)   . Osteoarthritis   . GERD (gastroesophageal reflux disease)   . Diverticulosis   . Glaucoma   . Obesity   . Vaginal yeast infection 12/08/2010  . SHOULDER PAIN, LEFT 05/19/2010    Qualifier: Diagnosis of  By: Clotilde Dieter MD, Amber    . Retained tampon 05/21/2012  . OSTEOARTHRITIS, KNEE 04/27/2010  . OBESITY, NOS 10/26/2006  . MALAISE AND FATIGUE 05/27/2010  . HEEL SPUR 09/10/2007    Qualifier: Diagnosis of  By: Lanier Prude  MD, Cathrine Muster    . DIVERTICULOSIS, COLON 10/26/2006  . Bacterial vaginosis 12/07/2010  . Hypertension   . Depression   . Headache   . Fibromyalgia     Past Surgical History  Procedure Laterality Date  . Abdominal hysterectomy  1990  . Bladder surgery  2001  . Cataract extraction Right 2001    with implant  . Abdominal hysterectomy    . Eye surgery      implant rt eye  . Rotator cuff repair Right   . Shoulder arthroscopy with subacromial decompression Left 07/20/2015    Procedure: SHOULDER ARTHROSCOPY WITH DEBRIDEMENT OF ROTATOR CUFF AND SUBACROMIAL DECOMPRESSION ;  Surgeon: Jones Broom, MD;  Location: [facility];  Service: Orthopedics;  Laterality: Left;  Left shoulder arthroscopy with debridement of rotator cuff and  subacromial decompression    There were no vitals filed for this visit.  Visit Diagnosis:  Shoulder stiffness, left  Pain in joint of left shoulder  Shoulder weakness      Subjective Assessment - 09/28/15 1128    Subjective sore 3/10   Currently in Pain? Yes   Pain Score 3    Pain Location Shoulder   Pain Orientation Left   Pain Descriptors / Indicators Sore   Aggravating Factors  changes in the weather.  Being outside in cold using her arms.   Pain Relieving Factors TENS                         [facility] Adult PT Treatment/Exercise - 09/28/15 1036    Therapeutic Activites    Work Simulation carry, reach lift carry all, tote,  problem solved carry tie tote with theraband and wheeled carry all together so she won't have to carry 2 bags over shoulder.     Shoulder Exercises: Sidelying   External Rotation Strengthening  LT with towel roll   External Rotation Weight (lbs) 3   ABduction Strengthening   ABduction Weight (lbs) 3   ABduction Limitations to 90 degrees   Shoulder Exercises: Standing   Row Strengthening   Theraband Level (Shoulder Row) Level 4 (Blue)   Other Standing Exercises rockwood green band,  3 way  30 X towel  roll for rotation   Shoulder Exercises: ROM/Strengthening   UBE (Upper Arm Bike) Arm bike  L2 3 minutes each way.                 PT Education - 09/28/15 1120    Education provided Yes   Education Details carrying techniques to get things in and out to work.     Person(s) Educated Patient   Methods Explanation;Verbal cues;Demonstration   Comprehension Verbalized understanding;Returned demonstration          PT Short Term Goals - 09/10/15 1224    PT SHORT TERM GOAL #1   Title Patient will demonstrate basic self care for AAROM/AROM, ice for pain control   09/02/15   Time 4   Period Weeks   Status Achieved   PT SHORT TERM GOAL #2   Title The patient will have 90 degrees of shoulder flexion/elevation needed for self care  grooming, dressing tasks and driving   Time 4   Period Weeks   Status Achieved   PT SHORT TERM GOAL #3   Title The patient will have improved ER ROM to 55 degrees needed for turning the steering wheel.   Time 4   Period Weeks   Status Achieved   PT SHORT TERM GOAL #4   Title Pain intensity improved by 25% with usual ADLs   Time 4   Period Weeks   Status Achieved           PT Long Term Goals - 09/22/15 1436    PT LONG TERM GOAL #1   Title The patient will be independent with safe self progression of HEP for further ROM and strengthening   09/30/15   Time 8   Period Weeks   Status On-going   PT LONG TERM GOAL #2   Title The patient will have 125 degrees of left shoulder flexion/elevation needed for reaching the switchboard at work Orthoptist-  code buttons)   Time 8   Period Weeks   Status Achieved   PT LONG TERM GOAL #3   Title The patient will have grossly 4/5 left glenohumeral and scapular strength needed for lifting code binders at work   Time 8   Period Weeks   Status On-going   PT LONG TERM GOAL #4   Title The patient will have improved IR and ER to 60 degrees needed for household chores and driving   Baseline 75 degrees   Time 8   Period Weeks   Status Achieved   PT LONG TERM GOAL #5   Title FOTO functional outcome score improved to 38% indicating improved function with less pain   Time 8   Period Weeks   Status On-going               Plan - 09/28/15 1123    Clinical Impression Statement Work simulation, strengthening focus.  POC 09/30/2015.  Pain 2-3 /10 post exercise .  She needs minor cues for home exercise bands for arm position.  She is able to use her arm a little more.    PT Next Visit Plan Renewal is patient's request she has about 2 more weeks scheduled.  She will have to discuss with PT.  MMT.   PT Home Exercise Plan continue   Consulted and Agree with Plan of Care Patient        Problem List Patient Active Problem List    Diagnosis Date Noted  . GERD (gastroesophageal reflux disease) 09/09/2014  .  Hyperlipidemia 05/24/2013  . Lung nodule seen on imaging study 09/21/2012  . Chronic pain of multiple sites - Knees, Ankles, Back 11/09/2011  . Lupus (systemic lupus erythematosus) (HCC) 06/28/2011  . HYPOTHYROIDISM 04/27/2010  . Depression 04/27/2010  . OSTEOARTHRITIS, KNEE 04/27/2010  . Diabetes mellitus type 2, uncontrolled, with complications (HCC) 08/14/2007  . Obesity 10/26/2006  . HYPERTENSION, BENIGN SYSTEMIC 10/26/2006  . VENOUS INSUFFICIENCY, CHRONIC 10/26/2006    [place] 09/28/2015, 11:30 AM  [facility] [address] [place], [place], [address] Phone: [phone]   Fax:  [phone]  Name: YEIMI DEBNAM MRN: [id number] Date of Birth: May 05, 1961    Liz Beach, PTA 09/28/2015 11:30 AM Phone: [phone] Fax: [phone]

## 2015-09-30 ENCOUNTER — Telehealth: Payer: Self-pay

## 2015-09-30 ENCOUNTER — Ambulatory Visit: Payer: 59 | Attending: Internal Medicine | Admitting: Physical Therapy

## 2015-09-30 DIAGNOSIS — R29898 Other symptoms and signs involving the musculoskeletal system: Secondary | ICD-10-CM | POA: Insufficient documentation

## 2015-09-30 DIAGNOSIS — M25512 Pain in left shoulder: Secondary | ICD-10-CM | POA: Diagnosis not present

## 2015-09-30 DIAGNOSIS — M25612 Stiffness of left shoulder, not elsewhere classified: Secondary | ICD-10-CM | POA: Insufficient documentation

## 2015-09-30 NOTE — Telephone Encounter (Signed)
FMLA paperwork received for this pt, requesting "at least" 15 day/month due to tremendous pain and inability to take her medications as prescribed due to excessive sedation (lyrica).  Pt has also forwarded a detailed letter regarding same.  PCP has letter, will forward FMLA forms for MD review. Pt advised of same

## 2015-09-30 NOTE — Therapy (Signed)
[place], [place], [address] Phone: [phone]   Fax:  [phone]  Physical Therapy Treatment/Discharge  Patient Details  Name: Bridget [name] MRN: [id number] Date of Birth: 1960/09/16 Referring Provider: Grier Mitts PA  Encounter Date: 09/30/2015      PT End of Session - 09/30/15 1047    Visit Number 16   Number of Visits 16   Date for PT Re-Evaluation 09/30/15   PT Start Time 6468   PT Stop Time 1048   PT Time Calculation (min) 33 min   Activity Tolerance Patient tolerated treatment well   Behavior During Therapy [facility] for tasks assessed/performed      Past Medical History  Diagnosis Date  . Diabetes mellitus without complication ([place])   . Osteoarthritis   . GERD (gastroesophageal reflux disease)   . Diverticulosis   . Glaucoma   . Obesity   . Vaginal yeast infection 12/08/2010  . SHOULDER PAIN, LEFT 05/19/2010    Qualifier: Diagnosis of  By: Annamary Carolin MD, Amber    . Retained tampon 05/21/2012  . OSTEOARTHRITIS, KNEE 04/27/2010  . OBESITY, NOS 10/26/2006  . MALAISE AND FATIGUE 05/27/2010  . HEEL SPUR 09/10/2007    Qualifier: Diagnosis of  By: Drue Flirt  MD, Merrily Brittle    . DIVERTICULOSIS, COLON 10/26/2006  . Bacterial vaginosis 12/07/2010  . Hypertension   . Depression   . Headache   . Fibromyalgia     Past Surgical History  Procedure Laterality Date  . Abdominal hysterectomy  1990  . Bladder surgery  2001  . Cataract extraction Right 2001    with implant  . Abdominal hysterectomy    . Eye surgery      implant rt eye  . Rotator cuff repair Right   . Shoulder arthroscopy with subacromial decompression Left 07/20/2015    Procedure: SHOULDER ARTHROSCOPY WITH DEBRIDEMENT OF ROTATOR CUFF AND SUBACROMIAL DECOMPRESSION ;  Surgeon: Tania Ade, MD;  Location: [place];  Service: Orthopedics;  Laterality: Left;  Left shoulder arthroscopy with debridement of rotator cuff  and subacromial decompression    There were no vitals filed for this visit.  Visit Diagnosis:  Shoulder stiffness, left  Pain in joint of left shoulder  Shoulder weakness      Subjective Assessment - 09/30/15 1018    Subjective "I'm just sleepy."  Feels arm is "almost there."   Patient Stated Goals do my hair, lift arm overhead   Currently in Pain? No/denies            [facility] PT Assessment - 09/30/15 1035    Observation/Other Assessments   Focus on Therapeutic Outcomes (FOTO)  71 (29% limited)   Strength   Left Shoulder Flexion 4/5   Left Shoulder Extension 5/5   Left Shoulder ABduction 3+/5   Left Shoulder Internal Rotation 5/5   Left Shoulder External Rotation 4/5                     [facility] Adult PT Treatment/Exercise - 09/30/15 1019    Shoulder Exercises: Supine   Other Supine Exercises reviewed HEP given 12/22; pt independent with green theraband   Shoulder Exercises: Standing   Internal Rotation Strengthening;Left;10 reps;Theraband   Theraband Level (Shoulder Internal Rotation) Level 3 (Green)   Flexion Strengthening;Left;10 reps;Theraband   Theraband Level (Shoulder Flexion) Level 3 (Green)   Shoulder Flexion Weight (lbs) 2# cabinet height   ABduction Left;10 reps   Shoulder ABduction Weight (lbs) 2# counter  to cabinet height   Extension Strengthening;Left;10 reps;Theraband   Theraband Level (Shoulder Extension) Level 3 (Green)   Row Strengthening;10 reps   Theraband Level (Shoulder Row) Level 3 (Green)   Shoulder Exercises: ROM/Strengthening   UBE (Upper Arm Bike) L3 x 6 min; 3 min each direction                PT Education - 09/30/15 1047    Education provided Yes   Education Details use of soup can for strengthening exercises   Person(s) Educated Patient   Methods Explanation;Demonstration   Comprehension Verbalized understanding;Returned demonstration          PT Short Term Goals - 09/10/15 1224    PT SHORT TERM GOAL #1    Title Patient will demonstrate basic self care for AAROM/AROM, ice for pain control   09/02/15   Time 4   Period Weeks   Status Achieved   PT SHORT TERM GOAL #2   Title The patient will have 90 degrees of shoulder flexion/elevation needed for self care grooming, dressing tasks and driving   Time 4   Period Weeks   Status Achieved   PT SHORT TERM GOAL #3   Title The patient will have improved ER ROM to 55 degrees needed for turning the steering wheel.   Time 4   Period Weeks   Status Achieved   PT SHORT TERM GOAL #4   Title Pain intensity improved by 25% with usual ADLs   Time 4   Period Weeks   Status Achieved           PT Long Term Goals - 09/30/15 1048    PT LONG TERM GOAL #1   Title The patient will be independent with safe self progression of HEP for further ROM and strengthening   09/30/15   Status Achieved   PT LONG TERM GOAL #2   Title The patient will have 125 degrees of left shoulder flexion/elevation needed for reaching the switchboard at work Engineer, manufacturing systems-  code buttons)   Status Achieved   PT LONG TERM GOAL #3   Title The patient will have grossly 4/5 left glenohumeral and scapular strength needed for lifting code binders at work   Status Partially Met   PT [place] #4   Title The patient will have improved IR and ER to 60 degrees needed for household chores and driving   Status Achieved   PT [place] #5   Title FOTO functional outcome score improved to 38% indicating improved function with less pain   Status Achieved               Plan - 09/30/15 1047    Clinical Impression Statement Pt has met all LTGs except shoulder abduction strength 3+/5; improved from initial evaluation.  Pt ready for d/c and independent with HEP to continue to improve strength in shoulder.   PT Next Visit Plan d/c PT   Consulted and Agree with Plan of Care Patient        Problem List Patient Active Problem List   Diagnosis Date Noted  . GERD  (gastroesophageal reflux disease) 09/09/2014  . Hyperlipidemia 05/24/2013  . Lung nodule seen on imaging study 09/21/2012  . Chronic pain of multiple sites - Knees, Ankles, Back 11/09/2011  . Lupus (systemic lupus erythematosus) ([place]) 06/28/2011  . HYPOTHYROIDISM 04/27/2010  . Depression 04/27/2010  . OSTEOARTHRITIS, KNEE 04/27/2010  . Diabetes mellitus type 2, uncontrolled, with complications ([place]) 09/73/5329  .  Obesity 10/26/2006  . HYPERTENSION, BENIGN SYSTEMIC 10/26/2006  . VENOUS INSUFFICIENCY, CHRONIC 10/26/2006   Laureen Abrahams, PT, DPT 09/30/2015 10:50 AM  [facility] [address] [place], [place], [address] Phone: [phone]   Fax:  [phone]  Name: Bridget [name] MRN: [id number] Date of Birth: April 25, 1961     PHYSICAL THERAPY DISCHARGE SUMMARY  Visits from Start of Care: 16  Current functional level related to goals / functional outcomes: See above   Remaining deficits: Pt has 4/5 strength in L shoulder; independent with HEP and verbalized understanding of exercise progression   Education / Equipment: HEP, posture  Plan: Patient agrees to discharge.  Patient goals were partially met. Patient is being discharged due to meeting the stated rehab goals.  ?????   Laureen Abrahams, PT, DPT 09/30/2015 10:51 AM  [facility] [address], [place] [address]  [phone] (office) [phone] (fax)

## 2015-10-05 ENCOUNTER — Other Ambulatory Visit: Payer: Self-pay | Admitting: Internal Medicine

## 2015-10-05 MED FILL — BYDUREON 2 MG PEN INJECT: 2 | 28 days supply | Qty: 4 | Fill #1

## 2015-10-05 MED FILL — CYCLOBENZAPRINE 5 MG TABLET: 5 | 30 days supply | Qty: 90 | Fill #0

## 2015-10-05 MED FILL — COLCHICINE 0.6 MG TABLET: 0.6 | 30 days supply | Qty: 30 | Fill #3

## 2015-10-05 NOTE — Telephone Encounter (Signed)
Pt advised in detail and informed to make a ROV to follow up as needed

## 2015-10-05 NOTE — Telephone Encounter (Signed)
Please call and let her know that this is not an appropriate request from [place]. If she would like to discus she needs visit.

## 2015-10-06 ENCOUNTER — Encounter: Payer: 59 | Admitting: Physical Therapy

## 2015-10-08 ENCOUNTER — Encounter: Payer: 59 | Admitting: Physical Therapy

## 2015-10-13 ENCOUNTER — Other Ambulatory Visit: Payer: Self-pay | Admitting: *Deleted

## 2015-10-13 NOTE — Patient Outreach (Signed)
[facility]) Care Management   10/13/2015  Bridget [name] 30-Dec-1960 [id number]  Bridget [name] is an 55 y.o. female who presents to the [place] Management office for routine Link To Wellness follow up for self management assistance with Type II DM, HTN and hyperlipidemia. She brings her lab work from 09/09/15 with her.  Subjective:  Bridget [name] continues to struggle with chronic health issues and chronic joint pain. She says she is back at work after having left shoulder surgery for repair of a torn rotator cuff and bone spurs in November. She says she has been referred to a rheumatologist at [facility] by her current rheumatologist for a second opinion. Her appointment at [facility] is 10/16/15. She says she started Bydureon on 09/18/15 and stopped Invokana due to frequent vaginal yeast infections. She denies adverse side effects of the Bydureon, says she is not noticing injection site nodules. She says she is not monitoring her blood sugars, following a low CHO meal plan or exercising as she is tired of dealing with her numerous health issues and pain.  Objective:   Review of Systems  Constitutional: Negative.   Musculoskeletal: Positive for joint pain.  Joint pain is intermittent.  Physical Exam  Constitutional: She is oriented to person, place, and time. She appears well-developed and well-nourished.  Respiratory: Effort normal.  Neurological: She is alert and oriented to person, place, and time.  Skin: Skin is warm and dry.  Psychiatric: She has a normal mood and affect. Her behavior is normal. Judgment and thought content normal.   Filed Weights   10/13/15 0800  Weight: 242 lb 6.4 oz (109.952 kg)   Filed Vitals:   10/13/15 0800  BP: 122/70   Current Medications:   Current Outpatient Prescriptions  Medication Sig Dispense Refill  . amitriptyline (ELAVIL) 25 MG tablet Take 0.5 tablets (12.5 mg total) by mouth at bedtime. 30 tablet 2   . aspirin 81 MG tablet Take 1 tablet (81 mg total) by mouth daily.    Marland Kitchen atorvastatin (LIPITOR) 40 MG tablet Take 1 tablet (40 mg total) by mouth daily. 90 tablet 3  . cetirizine (ZYRTEC) 10 MG tablet Take 1 tablet (10 mg total) by mouth daily. 30 tablet 11  . colchicine 0.6 MG tablet Take 0.6 mg by mouth daily.    . cyclobenzaprine (FLEXERIL) 5 MG tablet TAKE 1 TABLET BY MOUTH 3 TIMES DAILY AS NEEDED FOR MUSCLE SPASMS. 90 tablet 1  . docusate sodium (COLACE) 100 MG capsule Take 1 capsule (100 mg total) by mouth 3 (three) times daily as needed. 20 capsule 0  . Exenatide ER 2 MG PEN Inject 2 mg into the skin once a week. On Friday 4 each 6  . glimepiride (AMARYL) 4 MG tablet TAKE 1 TABLET (4 MG TOTAL) BY MOUTH DAILY WITH BREAKFAST. 90 tablet 3  . lisinopril (PRINIVIL,ZESTRIL) 5 MG tablet TAKE 1 TABLET BY MOUTH DAILY. 30 tablet 5  . ondansetron (ZOFRAN ODT) 8 MG disintegrating tablet Take 1 tablet (8 mg total) by mouth every 8 (eight) hours as needed for nausea or vomiting. 20 tablet 0  . pantoprazole (PROTONIX) 20 MG tablet TAKE 1 TABLET BY MOUTH DAILY. 30 tablet PRN  . pregabalin (LYRICA) 150 MG capsule Take 1 capsule (150 mg total) by mouth 2 (two) times daily. 60 capsule 2  . traMADol (ULTRAM) 50 MG tablet Take 1 tablet (50 mg total) by mouth every 6 (six) hours as needed. 10 tablet 0  .  triamcinolone (NASACORT AQ) 55 MCG/ACT AERO nasal inhaler Place 2 sprays into the nose daily. 1 Inhaler 12  . TRUETEST TEST test strip USE AS INSTRUCTED 100 each 11  . Alcohol Swabs (ALCOHOL PREPS) PADS 1 Device by Does not apply route 4 (four) times daily -  before meals and at bedtime. 100 each 11   No current facility-administered medications for this visit.    Functional Status:   In your present state of health, do you have any difficulty performing the following activities: 10/13/2015 04/21/2015  Hearing? N N  Vision? N N  Difficulty concentrating or making decisions? N N  Walking or climbing stairs?  Y Y  Dressing or bathing? N N  Doing errands, shopping? 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  Plan:  [facility] CM Care Plan Problem One        Most Recent Value   Care Plan Problem One  Type II DM not meeting Hgb A1C target of <7.0% as evidenced by Hgb A1C= 7.4% on 09/09/15 but with improved glycemic control as evidenced by previous Hgb A1C= 7.7% on 03/09/15 , with HTN and meeting treatment targets, with hyperlipidemia with worsening lipid control as evidenced by increased total cholesterol, triglycerides and LDL on lipid profile of 09/09/15     Role Documenting the Problem One  Care Management Coordinator   Care Plan for Problem One  Active   THN Long Term Goal (31-90 days)  Continued improved glycemic control as evidenced by Hgb A1C<7.0% at next 3 month check, improved lipid control as evidenced by normal lipid panel at next check, ongoing good control of HTN as evidenced by BP readings <140/<90    THN Long Term Goal Start Date  10/13/15   [facility] Long Term Goal Met Date  09/09/15   Interventions for Problem One Long Term Goal  again allowed Bridget [name] to voice her frustration about her state of health, her frequent absences from work and her inability to do her work  effectively which she attributes to the number of sedating medications she must take to control her chronic pain, reviewed the effect stress has on blood sugar control,  encouraged her to proceed with the referral to a rheumatologist at [facility] at Dr. Oletha Blend suggestion, encouraged her to contact a disability lawyer to see if she will qualify for long term disability, reviewed  strategies to decrease and help her cope with stress, reviewed medications and assessed adherence, discussed Bydureon's action, common side effects and potential to lower Hgb A1C by 0.5% to 1.6% and assist with weight loss, congratulated her on lowering her Hgb A1C and discussed how she accomplished this, arranged for Link To Wellness follow up in May       RNCM to fax today's office visit note to Dr. Sharlet Salina. RNCM will meet quarterly and as needed with patient per Link To Wellness program guidelines to assist with Type II DM, HTN and hyperlipidemia self-management and assess patient's progress toward mutually set goals.  Barrington Ellison RN,CCM,CDE [place] Management Coordinator Link To Wellness Office  Phone [phone] Office Fax [phone]

## 2015-10-15 ENCOUNTER — Ambulatory Visit (INDEPENDENT_AMBULATORY_CARE_PROVIDER_SITE_OTHER): Payer: 59 | Admitting: Internal Medicine

## 2015-10-15 ENCOUNTER — Encounter: Payer: Self-pay | Admitting: Internal Medicine

## 2015-10-15 ENCOUNTER — Telehealth: Payer: Self-pay | Admitting: *Deleted

## 2015-10-15 VITALS — BP 132/86 | HR 97 | Temp 98.5°F | Resp 18 | Ht 65.0 in | Wt 244.0 lb

## 2015-10-15 DIAGNOSIS — Z79899 Other long term (current) drug therapy: Secondary | ICD-10-CM | POA: Diagnosis not present

## 2015-10-15 DIAGNOSIS — R52 Pain, unspecified: Secondary | ICD-10-CM

## 2015-10-15 DIAGNOSIS — G8929 Other chronic pain: Secondary | ICD-10-CM

## 2015-10-15 MED ORDER — BENZONATATE 200 MG PO CAPS: 200.0000 mg | ORAL_CAPSULE | Freq: Three times a day (TID) | ORAL | Status: DC | PRN

## 2015-10-15 MED FILL — BENZONATATE 200 MG CAPSULE: 200 | 20 days supply | Qty: 60 | Fill #0

## 2015-10-15 NOTE — Assessment & Plan Note (Signed)
Per her reports she is able to use lyrica 150 mg BID when off which eliminates her pain. She then takes 50 mg QAM and 25 mg QPM when she is working to take the edge off. She is still falling asleep at work and counseled that perhaps she should try to shift positions at her work or go to part time to accommodate her medical problems. Ran her through [place] narcotic database as she states she rarely takes oxycodone or tramadol additionally which we do not prescribe that she had left over. She has had rx for tramadol for 10 pills in June of 2016 and no prescriptions filled for oxycodone in the last 1.5 years. She also has not filled her lyrica 150 mg since December which is plausible if she is only using a couple days a week. However she has not filled the 25 mg lyrica since July 2016 which would indicate that she is not taking or not filling appropriately therefore we will check UDS. Advised her that we cannot excuse 15 days per month out of work. Agree to increase 50% to 9 days per month. Strongly advised her if she feels unable to work she should pursue formal disability which we reminded her we do not certify at this office.

## 2015-10-15 NOTE — Progress Notes (Signed)
   Subjective:    Patient ID: Bridget [name], female    DOB: 05/23/1961, 55 y.o.   MRN: [id number]  HPI The patient is a 55 YO female coming in to discuss FMLA paperwork. She has asked our office for 15+ days per month to be out of work. We did not feel this was reasonable and asked her to come to discuss. She is not able to take her full dose of lyrica on days when she has to work otherwise she gets too sleepy. She needs to be able to focus at work and works overnight so this is already difficult. She feels that she should be able to miss work to take her medicine so she does not hurt all the time. Her diabetes is still not well controlled at this time. We have talked in the past that controlling her diabetes could help with her pain. She has also had some sinus troubles in the past 3-4 days and has missed work for 2 days. She is requesting at least 8 days per month so her short term disability will pay for missed days. If she is not at 8 days she is taking unpaid time off with FMLA.   Review of Systems  Constitutional: Positive for fatigue. Negative for fever, activity change and appetite change.  Respiratory: Negative for cough, chest tightness, shortness of breath and wheezing.   Cardiovascular: Negative for chest pain, palpitations and leg swelling.  Gastrointestinal: Negative for nausea, diarrhea and constipation.  Musculoskeletal: Positive for myalgias, back pain and arthralgias. Negative for gait problem.  Skin: Negative.   Neurological: Negative for dizziness, weakness and light-headedness.  Psychiatric/Behavioral: Positive for sleep disturbance and dysphoric mood.      Objective:   Physical Exam  Constitutional: She is oriented to person, place, and time. She appears well-developed and well-nourished. No distress.  Obese  HENT:  Head: Normocephalic and atraumatic.  Eyes: EOM are normal.  Neck: Normal range of motion.  Cardiovascular: Normal rate and regular rhythm.     Pulmonary/Chest: Effort normal and breath sounds normal. No respiratory distress. She has no wheezes. She has no rales.  Abdominal: Soft. She exhibits no distension. There is no rebound and no guarding.  Neurological: She is alert and oriented to person, place, and time. Coordination normal.  Skin: Skin is warm and dry.   Filed Vitals:   10/15/15 0951  BP: 132/86  Pulse: 97  Temp: 98.5 F (36.9 C)  TempSrc: Oral  Resp: 18  Height:  (1.651 m)  Weight: 244 lb (110.678 kg)  SpO2: 98%      Assessment & Plan:  Visit time 25 minutes: greater than 50% of that time was spent in counseling and coordination of care. Counseling on the nature of FMLA and discourse about her job and alternative options for employment with her employer and medication adherence.

## 2015-10-15 NOTE — Telephone Encounter (Signed)
Left msg on triage stating needing a note for work when she was out for two days would like note to also state if not better will not return to work until Monday...Bridget [name]

## 2015-10-15 NOTE — Patient Instructions (Signed)
We have sent in tessalon perles for the cough which you can take up to 3 times per day.  We will update the FMLA with the 9 days per month.   I would recommend to talking to an attorney and getting a disability evaluation to see what they think.

## 2015-10-15 NOTE — Progress Notes (Signed)
Pre visit review using our clinic review tool, if applicable. No additional management support is needed unless otherwise documented below in the visit note. 

## 2015-10-16 NOTE — Telephone Encounter (Signed)
Printed and signed.  

## 2015-10-16 NOTE — Telephone Encounter (Signed)
Notified pt with md response she states yes she need note for Wed & Thurs night...Raechel Chute

## 2015-10-16 NOTE — Telephone Encounter (Signed)
I asked specifically if she needed note for work at visit and she declined. I do not feel she would need to be out until Monday. Does she still want a note for the 2 days?

## 2015-10-16 NOTE — Telephone Encounter (Signed)
Notified pt letter ready for pick-up..lmb 

## 2015-10-21 ENCOUNTER — Telehealth: Payer: Self-pay | Admitting: Internal Medicine

## 2015-10-21 NOTE — Telephone Encounter (Signed)
Left voice mail informing patient she can come pick up letter and FMLA paperwork.

## 2015-10-21 NOTE — Telephone Encounter (Signed)
Patient needs a referral for a massage therapist in order to use her flexible spending account.

## 2015-10-21 NOTE — Telephone Encounter (Signed)
2.22.17 Pt needs a referral to see the [facility] Massage therapist. Please call pt with any questions. MS

## 2015-10-21 NOTE — Telephone Encounter (Signed)
Letter printed and signed, she can pick up.

## 2015-10-21 NOTE — Telephone Encounter (Signed)
I don't know that this referral exists in our computer system. Is she needing a note that medical massage is needed or what?

## 2015-10-22 NOTE — Telephone Encounter (Signed)
This is the only way I know of to recommend massage therapy for your health.

## 2015-10-22 NOTE — Telephone Encounter (Signed)
Called patient, no answer, left voicemail

## 2015-10-22 NOTE — Telephone Encounter (Signed)
Patient aware.

## 2015-10-22 NOTE — Telephone Encounter (Signed)
Pt is calling back stating that she needs a prescription for the massage therapist Can you please call her at [phone]

## 2015-10-22 NOTE — Telephone Encounter (Signed)
Patient says she needs a written prescription to send in to [facility] in order to get them to pay for it. The letter you gave her, she printed off my Chart and tried to use and she said they wouldn't accept it.

## 2015-10-26 DIAGNOSIS — Z09 Encounter for follow-up examination after completed treatment for conditions other than malignant neoplasm: Secondary | ICD-10-CM | POA: Diagnosis not present

## 2015-11-03 ENCOUNTER — Other Ambulatory Visit: Payer: Self-pay | Admitting: Internal Medicine

## 2015-11-03 MED FILL — LYRICA 150 MG CAPSULE: 150 | 30 days supply | Qty: 60 | Fill #0

## 2015-11-03 NOTE — Telephone Encounter (Signed)
Rx faxed to [place] pharmacy.../lmb 

## 2015-11-03 NOTE — Telephone Encounter (Signed)
Please advise, thanks.

## 2015-11-10 ENCOUNTER — Encounter: Payer: Self-pay | Admitting: Internal Medicine

## 2015-11-25 MED FILL — PANTOPRAZOLE SOD DR 20 MG T: 20 | 90 days supply | Qty: 90 | Fill #1

## 2015-11-25 MED FILL — BYDUREON 2 MG PEN INJECT: 2 | 28 days supply | Qty: 4 | Fill #2

## 2015-11-26 MED FILL — COLCHICINE 0.6 MG TABLET: 0.6 | 30 days supply | Qty: 30 | Fill #0

## 2015-12-04 DIAGNOSIS — M1000 Idiopathic gout, unspecified site: Secondary | ICD-10-CM | POA: Diagnosis not present

## 2015-12-04 DIAGNOSIS — Z79899 Other long term (current) drug therapy: Secondary | ICD-10-CM | POA: Diagnosis not present

## 2015-12-07 DIAGNOSIS — M797 Fibromyalgia: Secondary | ICD-10-CM | POA: Diagnosis not present

## 2015-12-07 DIAGNOSIS — M159 Polyosteoarthritis, unspecified: Secondary | ICD-10-CM | POA: Diagnosis not present

## 2015-12-17 MED FILL — BYDUREON 2 MG PEN INJECT: 2 | 28 days supply | Qty: 4 | Fill #3

## 2015-12-23 MED FILL — CYCLOBENZAPRINE 5 MG TABLET: 5 | 30 days supply | Qty: 90 | Fill #1

## 2016-01-06 MED FILL — GLIMEPIRIDE 4 MG TABLET: 4 | 90 days supply | Qty: 90 | Fill #2

## 2016-01-12 ENCOUNTER — Other Ambulatory Visit: Payer: Self-pay | Admitting: *Deleted

## 2016-01-12 ENCOUNTER — Encounter: Payer: Self-pay | Admitting: *Deleted

## 2016-01-12 VITALS — BP 110/76 | Ht 65.0 in | Wt 240.8 lb

## 2016-01-12 DIAGNOSIS — E119 Type 2 diabetes mellitus without complications: Secondary | ICD-10-CM

## 2016-01-12 LAB — POCT CBG (FASTING - GLUCOSE)-MANUAL ENTRY: GLUCOSE FASTING, POC: 191 mg/dL — AB (ref 70–99)

## 2016-01-12 LAB — POCT GLYCOSYLATED HEMOGLOBIN (HGB A1C): HEMOGLOBIN A1C: 9.9

## 2016-01-12 MED FILL — JANUVIA 100 MG TABLET: 100 | 90 days supply | Qty: 90 | Fill #1

## 2016-01-12 NOTE — Patient Outreach (Signed)
[facility]) Care Management   01/12/2016  Bridget [name] 08-13-1961 [id number]  Bridget [name] is an 55 y.o. female who [facility] Care Management office for routine Link To Wellness follow up for self management assistance with Type II DM, HTN and hyperlipidemia.  Subjective:  Bridget [name] says she has not been following a CHO controlled meal plan, exercising or checking her blood sugar. She says she is adherent with her medications. She says her work remains very stressful as they are down about 10 full time positions.  She says [facility] and will go to the pain clinic there on 6/16. She says she has a prescription for water therapy that was written by the [facility] rheumatologist but she says she does not know how to proceed with finding a therapist and facility to receive the therapy. She saw the MD there told her she does not have lupus or pseudo gout and he told her to stop the colchicine. He said her symptoms were likely due to osteoarthritis and fibromyalgia. She says she just returned from a trip to the beach and visiting her boyfriend and this helps her cope with the negative stress in her life. When reviewing her medications, She said she is taking both Januvia and Bydureon and is not taking Lipitor and has not been taking for several years. According to [facility], the last fill of the Lipitor was on 05/24/13. She continues to struggle with joint pain especially in her hips and knees.  She says she has not had a follow up CT of her chest to assess for the left lower lobe nodule that was seen on CT on 02/07/14.  Objective:   Review of Systems  Constitutional: Negative.   Feet checked, bilateral palpable pedal pulses , no edema, no skin impairment.  Physical Exam  Constitutional: She is oriented to person, place, and time. She appears well-developed and well-nourished.  Respiratory:  Effort normal.  Neurological: She is alert and oriented to person, place, and time.  Skin: Skin is warm and dry.  Psychiatric: She has a normal mood and affect. Her behavior is normal. Judgment and thought content normal.   Filed Weights   01/12/16 0735  Weight: 240 lb 12.8 oz (109.226 kg)   Filed Vitals:   01/12/16 0735  BP: 110/76   POC post prandial CBG= 191 POC A1C= 9.9% (estimated Average Glucose= 237)  Encounter Medications:   Outpatient Encounter Prescriptions as of 01/12/2016  Medication Sig Note  . Alcohol Swabs (ALCOHOL PREPS) PADS 1 Device by Does not apply route 4 (four) times daily -  before meals and at bedtime.   Marland Kitchen. amitriptyline (ELAVIL) 25 MG tablet Take 0.5 tablets (12.5 mg total) by mouth at bedtime.   . benzonatate (TESSALON) 200 MG capsule Take 1 capsule (200 mg total) by mouth 3 (three) times daily as needed for cough.   . cetirizine (ZYRTEC) 10 MG tablet Take 1 tablet (10 mg total) by mouth daily.   . Cranberry-Vitamin C-Probiotic (AZO CRANBERRY PO) Take by mouth daily. Reported on 10/13/2015   . cyclobenzaprine (FLEXERIL) 5 MG tablet TAKE 1 TABLET BY MOUTH 3 TIMES DAILY AS NEEDED FOR MUSCLE SPASMS.   Marland Kitchen. docusate sodium (COLACE) 100 MG capsule Take 1 capsule (100 mg total) by mouth 3 (three) times daily as needed.   . Exenatide ER 2 MG PEN Inject 2 mg into the skin once a week.  10/13/2015: Injects on Friday  . glimepiride (AMARYL) 4 MG tablet TAKE 1 TABLET (4 MG TOTAL) BY MOUTH DAILY WITH BREAKFAST.   Marland Kitchen lisinopril (PRINIVIL,ZESTRIL) 5 MG tablet TAKE 1 TABLET BY MOUTH DAILY.   Marland Kitchen LYRICA 150 MG capsule TAKE 1 CAPSULE BY MOUTH TWICE DAILY   . ondansetron (ZOFRAN ODT) 8 MG disintegrating tablet Take 1 tablet (8 mg total) by mouth every 8 (eight) hours as needed for nausea or vomiting.   . pantoprazole (PROTONIX) 20 MG tablet TAKE 1 TABLET BY MOUTH DAILY.   . sitaGLIPtin (JANUVIA) 100 MG tablet Take 100 mg by mouth daily.   Marland Kitchen triamcinolone (NASACORT AQ) 55 MCG/ACT AERO  nasal inhaler Place 2 sprays into the nose daily.   . TRUETEST TEST test strip USE AS INSTRUCTED   . aspirin 81 MG tablet Take 1 tablet (81 mg total) by mouth daily. (Patient not taking: Reported on 01/12/2016)   . atorvastatin (LIPITOR) 40 MG tablet Take 1 tablet (40 mg total) by mouth daily. (Patient not taking: Reported on 01/12/2016)   . colchicine 0.6 MG tablet Take 0.6 mg by mouth daily. Reported on 01/12/2016 Pt not taking    No facility-administered encounter medications on file as of 01/12/2016.    Functional Status:   In your present state of health, do you have any difficulty performing the following activities: 01/12/2016 01/12/2016  Hearing? N N  Vision? N N  Difficulty concentrating or making decisions? N N  Walking or climbing stairs? Y Y  Dressing or bathing? N N  Doing errands, shopping? N N    Fall/Depression Screening:    PHQ 2/9 Scores 01/12/2016 12/15/2014 09/14/2014 09/27/2013 08/07/2013 12/08/2011 11/09/2011  PHQ - 2 Score 0 1 0 0 0 6 6  PHQ- 9 Score - - - - - 6 22    Assessment:   [employer] employee and Link To Wellness member with Type II DM, HTN and hyperlipidemia, currently meeting treatment targets for HTN  With worsening glycemic control as evidenced by POC Hgb A1C= 9.9 , previous Hgb A1C= 7.4% % on 09/09/15.  Plan:  [name] CM Care Plan Problem One        Most Recent Value   Care Plan Problem One  Link To Wellness member with Type II DM, HTN hyperlipidemia and morbid obesity,   not meeting Hgb A1C target of <7.0% as evidenced by POC Hgb A1C= 9.9% today, previous Hgb A1C= = 7.4% on 09/09/15, meeting treatment targets for HTN,  hyperlipidemia with worsening lipid control as evidenced by increased total cholesterol, triglycerides and LDL on lipid profile of 09/09/15- not on statin,  and no weight loss    Role Documenting the Problem One  Care Management Coordinator   Care Plan for Problem One  Active   THN Long Term Goal (31-90 days)  Improved glycemic control as  evidenced by Hgb A1C<9.0% at next check, improved lipid control as evidenced by normal lipid panel at next check, ongoing good control of HTN as evidenced by BP readings <140/<90 and weight loss or no weight gain at next assessment    THN Long Term Goal Start Date  01/12/16   Interventions for Problem One Long Term Goal  congratulated Bridget [name] on keeping her appointment with the rheumatologist at [facility] and reviewed outcomes of visit, reviewed MD note with Bridget [name] via Care Everywhere, advised Bridget [name] this RNCM will investigate if she can receive prescribed water therapy in [place], encouraged her to keep her appointment with the [facility]  on 6/16, reviewed medications and advised Bridget [name] this [facility] will contact primary care MD to reconcile medications regarding Lipitor which she is not taking and Januvia which she is taking in addition to [facility], reviewed results of lipid panel done 09/09/15 and provided handout on how to lower triglycerides and LDL, allowed Bridget [name] to voice her frustration about her state of health, her frequent absences from work and her inability to do her work  effectively which she attributes to the number of sedating medications she must take to control her chronic pain, reviewed the effect stress has on blood sugar control,  assisted her with setting goals to improve glycemic control including eliminating sweets from one meal daily, eliminating sweetened etoh drinks if she has dessert with that meal and resuming stationary bike exercise at the [facility] exercise room at least one day a week.encouraged her to contact a disability lawyer to see if she will qualify for long term disability, reviewed strategies to decrease and help her cope with stress, reviewed medications and assessed adherence, discussed Bydureon's and Januvia's  action, common side effects and potential to lower Hgb A1C by 0.5% to 1.6% and assist with weight loss, advised her this RNCM will contact primary care MD  about medication reconciliation and follow up CT of chest to evaluate lung nodule found on 02/07/14 chest CT, arranged for Link To Wellness follow up in July       RNCM to fax today's office visit note to Dr. Okey Dupre. RNCM will meet quarterly and as needed with patient per Link To Wellness program guidelines to assist with Type II DM, HTN, hyperlipidemia and obesity self-management and assess patient's progress toward mutually set goals.  Bary Richard RN,CCM,CDE [facility] Care Management Coordinator Link To Wellness Office Phone [phone] Office Fax [phone]

## 2016-01-13 MED FILL — BYDUREON 2 MG PEN INJECT: 2 | 28 days supply | Qty: 4 | Fill #4

## 2016-01-21 ENCOUNTER — Telehealth: Payer: Self-pay | Admitting: Internal Medicine

## 2016-01-21 NOTE — Telephone Encounter (Signed)
Called and left patient a message informing her that we will place a referral for endo.

## 2016-01-21 NOTE — Telephone Encounter (Signed)
Patient called Bridget [name] back in regards to labs.  Patient states she has made an appointment with Dr. Okey Duprerawford for June.  States Dr. Okey Duprerawford can refer her to Endo if possible.  I told patient Bridget [name] would follow up in regards.

## 2016-01-22 ENCOUNTER — Other Ambulatory Visit: Payer: Self-pay | Admitting: Internal Medicine

## 2016-01-22 DIAGNOSIS — E1165 Type 2 diabetes mellitus with hyperglycemia: Principal | ICD-10-CM

## 2016-01-22 DIAGNOSIS — IMO0002 Reserved for concepts with insufficient information to code with codable children: Secondary | ICD-10-CM

## 2016-01-22 DIAGNOSIS — E1142 Type 2 diabetes mellitus with diabetic polyneuropathy: Secondary | ICD-10-CM

## 2016-02-02 MED FILL — LISINOPRIL 5 MG TABLET: 5 | 90 days supply | Qty: 90 | Fill #1

## 2016-02-05 ENCOUNTER — Ambulatory Visit (INDEPENDENT_AMBULATORY_CARE_PROVIDER_SITE_OTHER): Payer: 59 | Admitting: Internal Medicine

## 2016-02-05 ENCOUNTER — Encounter: Payer: Self-pay | Admitting: Internal Medicine

## 2016-02-05 VITALS — BP 132/76 | HR 89 | Temp 98.4°F | Resp 14 | Ht 65.0 in | Wt 240.0 lb

## 2016-02-05 DIAGNOSIS — IMO0002 Reserved for concepts with insufficient information to code with codable children: Secondary | ICD-10-CM

## 2016-02-05 DIAGNOSIS — E1165 Type 2 diabetes mellitus with hyperglycemia: Secondary | ICD-10-CM | POA: Diagnosis not present

## 2016-02-05 DIAGNOSIS — E118 Type 2 diabetes mellitus with unspecified complications: Secondary | ICD-10-CM | POA: Diagnosis not present

## 2016-02-05 NOTE — Progress Notes (Signed)
   Subjective:    Patient ID: Bridget [name], female    DOB: 1961-01-31, 55 y.o.   MRN: [id number]  HPI The patient is a 55 YO female coming in for nausea and follow up of her sugars. Has not had follow up of her sugars in some time as she is not interested in discussing them during our visits. The first 10 minutes of the visit she is updating me on her work and the schedule and how stressed that makes her and she works third shift and she doesn't have time to eat. She is not able to exercise. She was advised by her rheumatologist at [facility] to do some water therapy but she has not investigated this. Now that her HgA1c was 10 she is noticing more pain all over. She is also having some pain in her feet and numbness. Has not had an eye exam in some time although we have asked her to do that. She is not able to take her full FMLA time off at her job due to not having enough workers and she is upset about this as well. In checking the sugars at home she had an old insulin pen and took 10 units of insulin. She did bring it with her and is lantus.  Mostly sugars in the 180-200 in the morning without eating. Does not check sugars after eating. Was eating chocolate chip cookies and routinely eats sweets. She is taking the shot weekly and the amaryl. She did not tolerate metformin or invokana.   Review of Systems  Constitutional: Positive for activity change, appetite change and fatigue. Negative for fever.  Respiratory: Negative for cough, chest tightness, shortness of breath and wheezing.   Cardiovascular: Negative for chest pain, palpitations and leg swelling.  Gastrointestinal: Positive for nausea. Negative for diarrhea and constipation.  Musculoskeletal: Positive for myalgias, back pain and arthralgias. Negative for gait problem.  Skin: Negative.   Neurological: Positive for numbness. Negative for dizziness, weakness and light-headedness.  Psychiatric/Behavioral: Positive for sleep disturbance and dysphoric  mood.      Objective:   Physical Exam  Constitutional: She is oriented to person, place, and time. She appears well-developed and well-nourished. No distress.  Obese  HENT:  Head: Normocephalic and atraumatic.  Eyes: EOM are normal.  Neck: Normal range of motion.  Cardiovascular: Normal rate and regular rhythm.   Pulmonary/Chest: Effort normal and breath sounds normal. No respiratory distress. She has no wheezes. She has no rales.  Abdominal: Soft. She exhibits no distension. There is no rebound and no guarding.  Neurological: She is alert and oriented to person, place, and time. Coordination normal.  Skin: Skin is warm and dry.   Filed Vitals:   02/05/16 0948  BP: 132/76  Pulse: 89  Temp: 98.4 F (36.9 C)  TempSrc: Oral  Resp: 14  Height: 5\' 5"  (1.651 m)  Weight: 240 lb (108.863 kg)  SpO2: 98%      Assessment & Plan:  Visit time 25 minutes, greater than 50% of that time was spent face to face with the patient in counseling and coordination of care.

## 2016-02-05 NOTE — Progress Notes (Signed)
Pre visit review using our clinic review tool, if applicable. No additional management support is needed unless otherwise documented below in the visit note. 

## 2016-02-05 NOTE — Patient Instructions (Addendum)
You do have an appointment with the diabetes doctor on Monday. Please keep that appointment. It is very likely that you need to go back on insulin at least daily. Think about that this weekend and you may need to start that after talking to Dr. Loanne Drilling.   You need to work on decreasing the sweets and carbs that you are eating. These are causing a lot of the pain and problems that you are having.   Please call about the water therapy as exercise is very good for the sugars as well.   Tips for Eating Away From Home If You Have Diabetes Controlling your level of blood glucose, also known as blood sugar, can be challenging. It can be even more difficult when you do not prepare your own meals. The following tips can help you manage your diabetes when you eat away from home. PLANNING AHEAD Plan ahead if you know you will be eating away from home:  Ask your health care provider how to time meals and medicine if you are taking insulin.  Make a list of restaurants near you that offer healthy choices. If they have a carry-out menu, take it home and plan what you will order ahead of time.  Look up the restaurant you want to eat at online. Many chain and fast-food restaurants list nutritional information online. Use this information to choose the healthiest options and to calculate how many carbohydrates will be in your meal.  Use a carbohydrate-counting book or mobile app to look up the carbohydrate content and serving size of the foods you want to eat.  Become familiar with serving sizes and learn to recognize how many servings are in a portion. This will allow you to estimate how many carbohydrates you can eat. FREE FOODS A "free food" is any food or drink that has less than 5 g of carbohydrates per serving. Free foods include:  Many vegetables.  Hard boiled eggs.  Nuts or seeds.  Olives.  Cheeses.  Meats. These types of foods make good appetizer choices and are often available at salad  bars. Lemon juice, vinegar, or a low-calorie salad dressing of fewer than 20 calories per serving can be used as a "free" salad dressing.  CHOICES TO REDUCE CARBOHYDRATES  Substitute nonfat sweetened yogurt with a sugar-free yogurt. Yogurt made from soy milk may also be used, but you will still want a sugar-free or plain option to choose a lower carbohydrate amount.  Ask your server to take away the bread basket or chips from your table.  Order fresh fruit. A salad bar often offers fresh fruit choices. Avoid canned fruit because it is usually packed in sugar or syrup.  Order a salad, and eat it without dressing. Or, create a "free" salad dressing.  Ask for substitutions. For example, instead of [place] fries, request an order of a vegetable such as salad, green beans, or broccoli. OTHER TIPS   If you take insulin, take the insulin once your food arrives to your table. This will ensure your insulin and food are timed correctly.  Ask your server about the portion size before your order, and ask for a take-out box if the portion has more servings than you should have. When your food comes, leave the amount you should have on the plate, and put the rest in the take-out box.  Consider splitting an entree with someone and ordering a side salad.   This information is not intended to replace advice given to you by  your health care provider. Make sure you discuss any questions you have with your health care provider.   Document Released: 08/15/2005 Document Revised: 05/06/2015 Document Reviewed: 11/12/2013 Elsevier Interactive Patient Education 2016 [place].   Diabetes and Standards of Medical Care Diabetes is complicated. You may find that your diabetes team includes a dietitian, nurse, diabetes educator, eye doctor, and more. To help everyone know what is going on and to help you get the care you deserve, the following schedule of care was developed to help keep you on track. Below are the  tests, exams, vaccines, medicines, education, and plans you will need. HbA1c test This test shows how well you have controlled your glucose over the past 2-3 months. It is used to see if your diabetes management plan needs to be adjusted.   It is performed at least 2 times a year if you are meeting treatment goals.  It is performed 4 times a year if therapy has changed or if you are not meeting treatment goals. Blood pressure test  This test is performed at every routine medical visit. The goal is less than 140/90 mm Hg for most people, but 130/80 mm Hg in some cases. Ask your health care provider about your goal. Dental exam  Follow up with the dentist regularly. Eye exam  If you are diagnosed with type 1 diabetes as a child, get an exam upon reaching the age of 60 years or older and having had diabetes for 3-5 years. Yearly eye exams are recommended after that initial eye exam.  If you are diagnosed with type 1 diabetes as an adult, get an exam within 5 years of diagnosis and then yearly.  If you are diagnosed with type 2 diabetes, get an exam as soon as possible after the diagnosis and then yearly. Foot care exam  Visual foot exams are performed at every routine medical visit. The exams check for cuts, injuries, or other problems with the feet.  You should have a complete foot exam performed every year. This exam includes an inspection of the structure and skin of your feet, a check of the pulses in your feet, and a check of the sensation in your feet.  Type 1 diabetes: The first exam is performed 5 years after diagnosis.  Type 2 diabetes: The first exam is performed at the time of diagnosis.  Check your feet nightly for cuts, injuries, or other problems with your feet. Tell your health care provider if anything is not healing. Kidney function test (urine microalbumin)  This test is performed once a year.  Type 1 diabetes: The first test is performed 5 years after  diagnosis.  Type 2 diabetes: The first test is performed at the time of diagnosis.  A serum creatinine and estimated glomerular filtration rate (eGFR) test is done once a year to assess the level of chronic kidney disease (CKD), if present. Lipid profile (cholesterol, HDL, LDL, triglycerides)  Performed every 5 years for most people.  The goal for LDL is less than 100 mg/dL. If you are at high risk, the goal is less than 70 mg/dL.  The goal for HDL is 40 mg/dL-50 mg/dL for men and 50 mg/dL-60 mg/dL for women. An HDL cholesterol of 60 mg/dL or higher gives some protection against heart disease.  The goal for triglycerides is less than 150 mg/dL. Immunizations  The flu (influenza) vaccine is recommended yearly for every person 53 months of age or older who has diabetes.  The pneumonia (pneumococcal)  vaccine is recommended for every person 18 years of age or older who has diabetes. Adults 21 years of age or older may receive the pneumonia vaccine as a series of two separate shots.  The hepatitis B vaccine is recommended for adults shortly after they have been diagnosed with diabetes.  The Tdap (tetanus, diphtheria, and pertussis) vaccine should be given:  According to normal childhood vaccination schedules, for children.  Every 10 years, for adults who have diabetes. Diabetes self-management education  Education is recommended at diagnosis and ongoing as needed. Treatment plan  Your treatment plan is reviewed at every medical visit.   This information is not intended to replace advice given to you by your health care provider. Make sure you discuss any questions you have with your health care provider.   Document Released: 06/12/2009 Document Revised: 09/05/2014 Document Reviewed: 01/15/2013 Elsevier Interactive Patient Education [employer].

## 2016-02-05 NOTE — Assessment & Plan Note (Signed)
She is taking bydureon, amaryl. She was not able to tolerate metformin and invokana. She used to be on lantus and had wanted to discontinue. We did have a long and serious talk today about the consequences of her diabetes being uncontrolled and that she needs to address the sugars. We talked about the fact that her high sugars are likely causing or worsening a lot of her pain complaints and that addressing her sugars will help with that. She does not want to commit to start insulin today but will think about it and talk to Dr. Everardo AllEllison Monday and if he recommends she will do that. She is behind on eye exam and reminded her of the importance especially since her sugars are out of control. Her diet is poor and she is not exercising. Encouraged her to call about getting started with the water therapy to help with her immobility. We did also discuss that if her job is severely impacting her life and health that she might have to leave or change jobs.

## 2016-02-08 ENCOUNTER — Encounter: Payer: Self-pay | Admitting: Endocrinology

## 2016-02-08 ENCOUNTER — Ambulatory Visit (INDEPENDENT_AMBULATORY_CARE_PROVIDER_SITE_OTHER): Payer: 59 | Admitting: Endocrinology

## 2016-02-08 VITALS — BP 108/91 | HR 87 | Temp 98.6°F | Ht 65.0 in | Wt 243.2 lb

## 2016-02-08 DIAGNOSIS — E118 Type 2 diabetes mellitus with unspecified complications: Secondary | ICD-10-CM

## 2016-02-08 DIAGNOSIS — IMO0002 Reserved for concepts with insufficient information to code with codable children: Secondary | ICD-10-CM

## 2016-02-08 DIAGNOSIS — E1165 Type 2 diabetes mellitus with hyperglycemia: Secondary | ICD-10-CM | POA: Diagnosis not present

## 2016-02-08 MED ORDER — METFORMIN HCL ER 500 MG PO TB24: 500.0000 mg | ORAL_TABLET | Freq: Every day | ORAL | Status: DC

## 2016-02-08 MED FILL — METFORMIN HCL ER 500 MG TAB: 500 | 30 days supply | Qty: 30 | Fill #0

## 2016-02-08 NOTE — Progress Notes (Signed)
Subjective:    Patient ID: Bridget [name], female    DOB: Jan 09, 1961, 55 y.o.   MRN: [id number]  HPI pt states DM was dx'ed in 2000 (she had GDM in 1986 and 1990); she has moderate neuropathy of the lower extremities; she is unaware of any associated chronic complications; she took insulin from 2000-2003; pt says her diet and exercise are "average;" she has never had pancreatitis, severe hypoglycemia or DKA.  She stopped invokana due to yeast vaginitis.  She did not tolerate metformin (diarrhea).  She takes lantus as needed.  Past Medical History  Diagnosis Date  . Diabetes mellitus without complication (HCC)   . Osteoarthritis   . GERD (gastroesophageal reflux disease)   . Diverticulosis   . Glaucoma   . Obesity   . Vaginal yeast infection 12/08/2010  . SHOULDER PAIN, LEFT 05/19/2010    Qualifier: Diagnosis of  By: Clotilde Dieter MD, Amber    . Retained tampon 05/21/2012  . OSTEOARTHRITIS, KNEE 04/27/2010  . OBESITY, NOS 10/26/2006  . MALAISE AND FATIGUE 05/27/2010  . HEEL SPUR 09/10/2007    Qualifier: Diagnosis of  By: Lanier Prude  MD, Cathrine Muster    . DIVERTICULOSIS, COLON 10/26/2006  . Bacterial vaginosis 12/07/2010  . Hypertension   . Depression   . Headache   . Fibromyalgia     Past Surgical History  Procedure Laterality Date  . Abdominal hysterectomy  1990  . Bladder surgery  2001  . Cataract extraction Right 2001    with implant  . Abdominal hysterectomy    . Eye surgery      implant rt eye  . Rotator cuff repair Right   . Shoulder arthroscopy with subacromial decompression Left 07/20/2015    Procedure: SHOULDER ARTHROSCOPY WITH DEBRIDEMENT OF ROTATOR CUFF AND SUBACROMIAL DECOMPRESSION ;  Surgeon: Jones Broom, MD;  Location: [facility];  Service: Orthopedics;  Laterality: Left;  Left shoulder arthroscopy with debridement of rotator cuff and subacromial decompression    Social History   Social History  . Marital Status: Divorced    Spouse Name: N/A  . Number  of Children: 2  . Years of Education: N/A   Occupational History  . PBX operator [place]   Social History Main Topics  . Smoking status: Former Smoker -- 0.30 packs/day    Types: Cigarettes    Quit date: 08/29/1998  . Smokeless tobacco: Never Used     Comment: Quit in 2000   . Alcohol Use: Yes     Comment: 1 per week  . Drug Use: No  . Sexual Activity: Yes    Birth Control/ Protection: Surgical   Other Topics Concern  . 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 Alcohol Swabs (ALCOHOL PREPS) PADS 1 Device by Does not apply route 4 (four) times daily -  before meals and at bedtime. 100 each 11  . amitriptyline (ELAVIL) 25 MG tablet Take 0.5 tablets (12.5 mg total) by mouth at bedtime. 30 tablet 2  . aspirin 81 MG tablet Take 1 tablet (81 mg total) by mouth daily.    . cetirizine (ZYRTEC) 10 MG tablet Take 1 tablet (10 mg total) by mouth daily. 30 tablet 11  . Cranberry-Vitamin C-Probiotic (AZO CRANBERRY PO) Take by mouth daily. Reported on 10/13/2015    . cyclobenzaprine (FLEXERIL) 5 MG tablet TAKE 1  TABLET BY MOUTH 3 TIMES DAILY AS NEEDED FOR MUSCLE SPASMS. 90 tablet 1  . docusate sodium (COLACE) 100 MG capsule Take 1 capsule (100 mg total) by mouth 3 (three) times daily as needed. 20 capsule 0  . Exenatide ER 2 MG PEN Inject 2 mg into the skin once a week. 4 each 6  . glimepiride (AMARYL) 4 MG tablet TAKE 1 TABLET (4 MG TOTAL) BY MOUTH DAILY WITH BREAKFAST. 90 tablet 3  . lisinopril (PRINIVIL,ZESTRIL) 5 MG tablet TAKE 1 TABLET BY MOUTH DAILY. 30 tablet 5  . LYRICA 150 MG capsule TAKE 1 CAPSULE BY MOUTH TWICE DAILY 60 capsule 2  . ondansetron (ZOFRAN ODT) 8 MG disintegrating tablet Take 1 tablet (8 mg total) by mouth every 8 (eight) hours as needed for nausea or vomiting. 20 tablet 0  . pantoprazole (PROTONIX) 20 MG tablet TAKE 1 TABLET BY MOUTH  DAILY. 30 tablet PRN  . triamcinolone (NASACORT AQ) 55 MCG/ACT AERO nasal inhaler Place 2 sprays into the nose daily. 1 Inhaler 12  . TRUETEST TEST test strip USE AS INSTRUCTED 100 each 11   No current facility-administered medications on file prior to visit.    Allergies  Allergen Reactions  . Citrus Itching  . Invokana [Canagliflozin] Other (See Comments)    Caused frequent vaginal yeast infections  . Codeine Hives  . Metformin And Related Diarrhea and Nausea And Vomiting  . Metoclopramide Hcl Nausea And Vomiting  . Penicillins Nausea And Vomiting  . Propoxyphene N-Acetaminophen Nausea And Vomiting  . Sulfonamide Derivatives Hives    Family History  Problem Relation Age of Onset  . Breast cancer Mother   . Ovarian cancer Maternal Grandmother   . Stomach cancer Cousin   . Diabetes Mother   . Heart disease Maternal Grandmother     great  . Diabetes Brother     x 3    BP 108/91 mmHg  Pulse 87  Temp(Src) 98.6 F (37 C) (Oral)  Ht 5\' 5"  (1.651 m)  Wt 243 lb 3.2 oz (110.315 kg)  BMI 40.47 kg/m2  SpO2 97%  Review of Systems denies weight loss, blurry vision, headache, chest pain, sob, n/v, muscle cramps, excessive diaphoresis, depression, cold intolerance, and rhinorrhea.  She has chronic diffuse arthralgias and myalgias.  She has frequent urination and easy bruising.     Objective:   Physical Exam VS: see vs page GEN: no distress.  Morbid obesity HEAD: head: no deformity eyes: no periorbital swelling, no proptosis external nose and ears are normal mouth: no lesion seen NECK: supple, thyroid is not enlarged CHEST WALL: no deformity LUNGS: clear to auscultation CV: reg rate and rhythm, no murmur ABD: abdomen is soft, nontender.  no hepatosplenomegaly.  not distended.  no hernia MUSCULOSKELETAL: muscle bulk and strength are grossly normal.  no obvious joint swelling.  gait is normal and steady EXTEMITIES: no deformity.  no ulcer on the feet.  feet are of normal  color and temp.  Trace bilat leg edema PULSES: dorsalis pedis intact bilat.  no carotid bruit NEURO:  cn 2-12 grossly intact.   readily moves all 4's.  sensation is intact to touch on the feet, but decreased from normal.  SKIN:  Normal texture and temperature.  No rash or suspicious lesion is visible.   NODES:  None palpable at the neck.  PSYCH: alert, well-oriented.  Does not appear anxious nor depressed.     Lab Results  Component Value Date   HGBA1C 9.9 01/12/2016  Lab Results  Component Value Date   CREATININE 0.86 07/17/2015   BUN 17 07/17/2015   NA 139 07/17/2015   K 4.3 07/17/2015   CL 108 07/17/2015   CO2 23 07/17/2015   i personally reviewed electrocardiogram tracing (07/17/15): Indication: DM.   Impression: normal except for QS complexes in V1    Assessment & Plan:  Type 2 DM: we discussed options (multiple orals vs resuming insulin).  She wants to try to control with orals.   Yeast vaginitis:she declines to re-try invokana, even at 100 mg.   Diarrhea: she should have a trial of ext-release metformin.   Morbid obesity: new to me.   Patient is advised the following: Patient Instructions  good diet and exercise significantly improve the control of your diabetes.  please let me know if you wish to be referred to a dietician.  high blood sugar is very risky to your health.  you should see an eye doctor and dentist every year.  It is very important to get all recommended vaccinations.  controlling your blood pressure and cholesterol drastically reduces the damage diabetes does to your body.  Those who smoke should quit.  please discuss these with your doctor.  check your blood sugar once a day.  vary the time of day when you check, between before the 3 meals, and at bedtime.  also check if you have symptoms of your blood sugar being too high or too low.  please keep a record of the readings and bring it to your next appointment here (or you can bring the meter itself).  You  can write it on any piece of paper.  please call us sooner if your blood sugar goes below 70, or if you have a lot of readings over 200.   Please consider having weight loss surgery.  It is good for your health.  Here is some information about it.  If you decide to consider further, please call the phone number in the papers, and register for a free informational meeting.   For now, please continue the same bydureon and glimepiride, and: Add a small amount of extended-release metformin. Pleas call us next week, to tell us how the blood sugar is doing.  Other options are welchol, bromocriptine, acarbose, and insulin.  Please come back for a follow-up appointment in 1 month.   Romero BellingELLISON, Rehman Levinson, MD

## 2016-02-08 NOTE — Patient Instructions (Addendum)
good diet and exercise significantly improve the control of your diabetes.  please let me know if you wish to be referred to a dietician.  high blood sugar is very risky to your health.  you should see an eye doctor and dentist every year.  It is very important to get all recommended vaccinations.  controlling your blood pressure and cholesterol drastically reduces the damage diabetes does to your body.  Those who smoke should quit.  please discuss these with your doctor.  check your blood sugar once a day.  vary the time of day when you check, between before the 3 meals, and at bedtime.  also check if you have symptoms of your blood sugar being too high or too low.  please keep a record of the readings and bring it to your next appointment here (or you can bring the meter itself).  You can write it on any piece of paper.  please call us sooner if your blood sugar goes below 70, or if you have a lot of readings over 200.   Please consider having weight loss surgery.  It is good for your health.  Here is some information about it.  If you decide to consider further, please call the phone number in the papers, and register for a free informational meeting.   For now, please continue the same bydureon and glimepiride, and: Add a small amount of extended-release metformin. Pleas call us next week, to tell us how the blood sugar is doing.  Other options are welchol, bromocriptine, acarbose, and insulin.  Please come back for a follow-up appointment in 1 month.

## 2016-02-08 NOTE — Progress Notes (Signed)
Pre visit review using our clinic tool,if applicable. No additional management support is needed unless otherwise documented below in the visit note.  

## 2016-02-11 MED FILL — LYRICA 150 MG CAPSULE: 150 | 30 days supply | Qty: 60 | Fill #1

## 2016-02-12 DIAGNOSIS — G894 Chronic pain syndrome: Secondary | ICD-10-CM | POA: Diagnosis not present

## 2016-02-12 DIAGNOSIS — M25512 Pain in left shoulder: Secondary | ICD-10-CM | POA: Diagnosis not present

## 2016-02-12 DIAGNOSIS — M797 Fibromyalgia: Secondary | ICD-10-CM | POA: Diagnosis not present

## 2016-02-12 DIAGNOSIS — M25511 Pain in right shoulder: Secondary | ICD-10-CM | POA: Diagnosis not present

## 2016-02-15 MED FILL — BYDUREON 2 MG PEN INJECT: 2 | 28 days supply | Qty: 4 | Fill #5

## 2016-02-17 ENCOUNTER — Other Ambulatory Visit: Payer: Self-pay | Admitting: *Deleted

## 2016-02-17 MED ORDER — TRUE METRIX METER W/DEVICE KIT: PACK | Status: DC

## 2016-02-17 MED ORDER — GLUCOSE BLOOD VI STRP: 1.0000 | ORAL_STRIP | Freq: Two times a day (BID) | Status: DC

## 2016-02-17 MED ORDER — TRUEPLUS LANCETS 28G MISC: Status: DC

## 2016-02-17 MED FILL — TRUE METRIX GLUCOSE TEST ST: 50 days supply | Qty: 100 | Fill #0

## 2016-02-17 MED FILL — TRUEplus LANCETS 30G MISC: 50 days supply | Qty: 100 | Fill #0

## 2016-02-17 NOTE — Telephone Encounter (Signed)
Receive fax stating needing rx for True Metrix monitor w/supplies no longer using Truetest.../lmb

## 2016-02-19 ENCOUNTER — Telehealth: Payer: Self-pay | Admitting: Endocrinology

## 2016-02-19 NOTE — Telephone Encounter (Signed)
please call patient: Options are: Change the metformin to bromocriptine, or: Add "welchol." this helps the blood sugar and also stops the diarrhea.

## 2016-02-19 NOTE — Telephone Encounter (Signed)
I contacted the pt and advised of note below via vm. Requested a call back from the pt to verify how to proceed

## 2016-02-19 NOTE — Telephone Encounter (Signed)
Could you review note below and please advise during Dr. George HughEllison's absence? Thanks!

## 2016-02-19 NOTE — Telephone Encounter (Signed)
Metformin cannot be taken she is having a prob with side effects of diarrhea and nausea,

## 2016-02-22 ENCOUNTER — Telehealth: Payer: Self-pay | Admitting: Endocrinology

## 2016-02-22 MED ORDER — BROMOCRIPTINE MESYLATE 2.5 MG PO TABS: ORAL_TABLET | ORAL | Status: DC

## 2016-02-22 MED FILL — BROMOCRIPTINE 2.5 MG TABLET: 2.5 | 32 days supply | Qty: 8 | Fill #0

## 2016-02-22 NOTE — Telephone Encounter (Signed)
Ok, I have sent a prescription to your pharmacy You start with 1/4 tab daily. Let me know if this does not help, and we'll increase to 1/2 tab daily

## 2016-02-22 NOTE — Telephone Encounter (Signed)
PT requests to change to Bromocriptine

## 2016-02-22 NOTE — Addendum Note (Signed)
Addended by: Romero BellingELLISON, Deshanae Lindo on: 02/22/2016 04:13 PM   Modules accepted: Orders, Medications

## 2016-02-22 NOTE — Telephone Encounter (Signed)
I contacted the pt and left a vm advising of note below. Requested a call back if the pt would like to discuss.  

## 2016-02-22 NOTE — Telephone Encounter (Signed)
Pt called back and requested to change to bromocriptine.

## 2016-02-22 NOTE — Telephone Encounter (Signed)
Dr. Everardo AllEllison advised of note pt's request on a separate telephone encounter. Waiting on response.

## 2016-02-24 ENCOUNTER — Telehealth: Payer: Self-pay | Admitting: Endocrinology

## 2016-02-24 NOTE — Telephone Encounter (Signed)
Noted  

## 2016-02-24 NOTE — Telephone Encounter (Signed)
Let pt know that the bromocriptine is to replace the metformin as she requested

## 2016-03-08 ENCOUNTER — Other Ambulatory Visit: Payer: Self-pay | Admitting: *Deleted

## 2016-03-08 ENCOUNTER — Encounter: Payer: Self-pay | Admitting: *Deleted

## 2016-03-08 NOTE — Addendum Note (Signed)
Addended by: Bary RichardHAUSER, Adrain Butrick S on: 03/08/2016 02:32 PM   Modules accepted: Medications

## 2016-03-08 NOTE — Patient Outreach (Addendum)
Bridget [name]) Care Management   03/08/2016  Bridget [name] Dec 02, 1960 [id number]  Bridget [name] is an 55 y.o. female who presents to the [place] [facility] office for routine Link To Wellness follow up for self management assistance with Type II DM, HTN, hyperlipidemia and obesity.  Subjective: Bridget [name] says she was referred by Dr. Sharlet Salina to Dr. Loanne Drilling for DM management and she established care with him on 6/12. She says she was started on Metformin but once again she could not tolerate it due to GI side effects so she is now on bromocryptine in addition to exenatide and glimepiride to improve glycemic control. She admits she has not done well with following a CHO controlled meal plan. Dr. Loanne Drilling has referred her to a general surgeon for metabolic surgery consult who she will see today.  She reports she was referred to the [facility] by Dr. Estanislado Pandy for chronic pain issues and was prescribed 3 mg Naltrexone at dinner but she was not able to get it filled at the [facility] because it is a compounded drug. She was referred to [facility] to obtain the medication. She said she was also screened for illegal drugs. She says she will return to the pain clinic in August to see a psychiatrist and a physician and NP.  She sought advice at [facility] for a natural antiinflammatory and Curamin was suggested so she started taking one capsule daily and says it has helped tremendously.   Objective:   Review of Systems  Constitutional: Negative.     Physical Exam  Constitutional: She is oriented to person, place, and time. She appears well-developed and well-nourished.  Respiratory: Effort normal.  Neurological: She is alert and oriented to person, place, and time.  Skin: Skin is warm and dry.  Psychiatric: She has a normal mood and affect. Her behavior is normal. Judgment and thought content normal.  Feet examined, dried  skin between first and second toes on left foot, and 4th and 5th toes on right foot- (probably healing athlete's foot), otherwise no skin impairment  Filed Vitals:   03/08/16 0724  BP: 110/70   Filed Weights   03/08/16 0724  Weight: 239 lb 6.4 oz (108.591 kg)    Encounter Medications:   Outpatient Encounter Prescriptions as of 03/08/2016  Medication Sig Note  . Alcohol Swabs (ALCOHOL PREPS) PADS 1 Device by Does not apply route 4 (four) times daily -  before meals and at bedtime.   Marland Kitchen amitriptyline (ELAVIL) 25 MG tablet Take 0.5 tablets (12.5 mg total) by mouth at bedtime.   Marland Kitchen aspirin 81 MG tablet Take 1 tablet (81 mg total) by mouth daily.   . Blood Glucose Monitoring Suppl (TRUE METRIX METER) w/Device KIT Use to check blood sugars twice a day Dx E11.9   . bromocriptine (PARLODEL) 2.5 MG tablet 1/4 tab daily   . cetirizine (ZYRTEC) 10 MG tablet Take 1 tablet (10 mg total) by mouth daily. 10/13/2015: Takes orn  . Cranberry-Vitamin C-Probiotic (AZO CRANBERRY PO) Take by mouth daily. Reported on 10/13/2015 03/08/2016: Takes prn  . cyclobenzaprine (FLEXERIL) 5 MG tablet TAKE 1 TABLET BY MOUTH 3 TIMES DAILY AS NEEDED FOR MUSCLE SPASMS.   Marland Kitchen docusate sodium (COLACE) 100 MG capsule Take 1 capsule (100 mg total) by mouth 3 (three) times daily as needed.   . Exenatide ER 2 MG PEN Inject 2 mg into the skin once a week. 10/13/2015: Injects on Friday  .  glimepiride (AMARYL) 4 MG tablet TAKE 1 TABLET (4 MG TOTAL) BY MOUTH DAILY WITH BREAKFAST.   Marland Kitchen glucose blood (TRUE METRIX BLOOD GLUCOSE TEST) test strip 1 each by Other route 2 (two) times daily. Use to check blood sugars twice a day Dx E11.9   . lisinopril (PRINIVIL,ZESTRIL) 5 MG tablet TAKE 1 TABLET BY MOUTH DAILY.   Marland Kitchen LYRICA 150 MG capsule TAKE 1 CAPSULE BY MOUTH TWICE DAILY   . ondansetron (ZOFRAN ODT) 8 MG disintegrating tablet Take 1 tablet (8 mg total) by mouth every 8 (eight) hours as needed for nausea or vomiting.   Marland Kitchen OVER THE COUNTER MEDICATION  1 tablet daily. 03/08/2016: Curamin with tumerons  . pantoprazole (PROTONIX) 20 MG tablet TAKE 1 TABLET BY MOUTH DAILY.   Marland Kitchen triamcinolone (NASACORT AQ) 55 MCG/ACT AERO nasal inhaler Place 2 sprays into the nose daily.   . TRUEPLUS LANCETS 28G MISC Use to help check blood sugars twice a day Dx E11.9   . TRUETEST TEST test strip USE AS INSTRUCTED 03/08/2016: Using True Metrix  . NONFORMULARY OR COMPOUNDED ITEM Take 1 tablet by mouth at bedtime. Reported on 03/08/2016 03/08/2016: Naltrexone 3 mg   No facility-administered encounter medications on file as of 03/08/2016.    Functional Status:   In your present state of health, do you have any difficulty performing the following activities: 03/08/2016 02/08/2016  Hearing? N N  Vision? N Y  Difficulty concentrating or making decisions? N N  Walking or climbing stairs? Y Y  Dressing or bathing? N N  Doing errands, shopping? N N    Fall/Depression Screening:    PHQ 2/9 Scores 02/08/2016 01/12/2016 12/15/2014 09/14/2014 09/27/2013 08/07/2013 12/08/2011  PHQ - 2 Score 0 0 1 0 0 0 6  PHQ- 9 Score - - - - - - 6  Exception Documentation Patient refusal - - - - - -    Assessment:   [employer] employee with Type II DM, HTN and morbid obesity. Meeting treatment target for HTN only.  Plan:  [facility] CM Care Plan Problem One        Most Recent Value   Care Plan Problem One  Link To Wellness member with Type II DM, HTN hyperlipidemia and morbid obesity,   not meeting Hgb A1C target of <7.0% as evidenced by POC Hgb A1C= 9.9% on 01/12/16, previous Hgb A1C= = 7.4% on 09/09/15, meeting treatment targets for HTN,  hyperlipidemia with worsening lipid control as evidenced by increased total cholesterol (202) , triglycerides (227) and LDL (119) on lipid profile of 09/09/15- not on statin,  and 0.3 lb  weight loss since last visit in mid May   Role Documenting the Problem One  Care Management [place] for Problem One  Active   [facility] Long Term Goal (31-90 days)   Improved glycemic control as evidenced by Hgb A1C<9.0% at next check, improved lipid control as evidenced by normal lipid panel at next check, ongoing good control of HTN as evidenced by BP readings <140/<90 and weight loss or no weight gain at next assessment    [facility] Long Term Goal Start Date  03/08/16   Interventions for Problem One Long Term Goal  congratulated Maleigha on keeping her appointment on 6/16 with the [facility] and reviewed outcomes of visit,  reviewed medications and assessed adherence, assisted Vaani with [place] location on [employer],  allowed Walsh to voice her ongoing frustration with her health issues,  discussed pros  and cons of metabolic weight loss surgery and congratulated Edlyn on pursuing surgical consult today, assigned Emmi educational modules related to weight loss surgery to help her make an informed decision,  reviewed upcoming appointments with Dr. Loanne Drilling on 7/12 and with [facility] professionals on 8/16, and 8/22, arranged for Link To Wellness follow up on 05/03/16    RNCM to fax today's office visit note to Dr. Sharlet Salina.  RNCM will meet quarterly and as needed with patient per Link To Wellness program guidelines to assist with Type II DM, HTN, hyperlipidemia and obesity self-management and assess patient's progress toward mutually set goals.  Barrington Ellison RN,CCM,CDE [place] Management Coordinator Link To Wellness Office Phone [phone] Office Fax [phone]

## 2016-03-09 ENCOUNTER — Encounter: Payer: Self-pay | Admitting: Endocrinology

## 2016-03-09 ENCOUNTER — Other Ambulatory Visit (HOSPITAL_COMMUNITY): Payer: Self-pay | Admitting: General Surgery

## 2016-03-09 ENCOUNTER — Ambulatory Visit (INDEPENDENT_AMBULATORY_CARE_PROVIDER_SITE_OTHER): Payer: 59 | Admitting: Endocrinology

## 2016-03-09 VITALS — BP 127/76 | HR 82 | Temp 98.7°F | Ht 66.0 in | Wt 241.8 lb

## 2016-03-09 DIAGNOSIS — E118 Type 2 diabetes mellitus with unspecified complications: Secondary | ICD-10-CM | POA: Diagnosis not present

## 2016-03-09 DIAGNOSIS — E1165 Type 2 diabetes mellitus with hyperglycemia: Secondary | ICD-10-CM

## 2016-03-09 DIAGNOSIS — IMO0002 Reserved for concepts with insufficient information to code with codable children: Secondary | ICD-10-CM

## 2016-03-09 MED ORDER — COLESEVELAM HCL 625 MG PO TABS: 1250.0000 mg | ORAL_TABLET | Freq: Two times a day (BID) | ORAL | Status: DC

## 2016-03-09 MED FILL — WELCHOL 625 MG TABLET: 625 | 30 days supply | Qty: 120 | Fill #0

## 2016-03-09 NOTE — Progress Notes (Signed)
Subjective:    Patient ID: FADUMO HENG, female    DOB: 11-13-1960, 55 y.o.   MRN: [id number]  HPI  Pt returns for f/u of diabetes mellitus: DM type: 2 Dx'ed: 4098 Complications: polyneuropathy.   Therapy: bydureon + 2 oral meds.  GDM: never DKA: never Severe hypoglycemia: never Pancreatitis: never.  Other: she did not tolerate invokana (vaginitis) or metformin-XR (diarrhea); edema precludes pioglitizone rx.  she took insulin from 2000-2003.  Interval history: she tolerates parlodel well.  no cbg record, but states cbg's vary from 150-209.   Obesity: she has seen a dietician.  She has taken OTC weight loss--stopped due to palpitations.  She does exercise, but this is limited by arthralgias.   Past Medical History  Diagnosis Date  . Diabetes mellitus without complication ([place])   . Osteoarthritis   . GERD (gastroesophageal reflux disease)   . Diverticulosis   . Glaucoma   . Obesity   . Vaginal yeast infection 12/08/2010  . SHOULDER PAIN, LEFT 05/19/2010    Qualifier: Diagnosis of  By: Annamary Carolin MD, Amber    . Retained tampon 05/21/2012  . OSTEOARTHRITIS, KNEE 04/27/2010  . OBESITY, NOS 10/26/2006  . MALAISE AND FATIGUE 05/27/2010  . HEEL SPUR 09/10/2007    Qualifier: Diagnosis of  By: Drue Flirt  MD, Merrily Brittle    . DIVERTICULOSIS, COLON 10/26/2006  . Bacterial vaginosis 12/07/2010  . Hypertension   . Depression   . Headache   . Fibromyalgia     Past Surgical History  Procedure Laterality Date  . Abdominal hysterectomy  1990  . Bladder surgery  2001  . Cataract extraction Right 2001    with implant  . Abdominal hysterectomy    . Eye surgery      implant rt eye  . Rotator cuff repair Right   . Shoulder arthroscopy with subacromial decompression Left 07/20/2015    Procedure: SHOULDER ARTHROSCOPY WITH DEBRIDEMENT OF ROTATOR CUFF AND SUBACROMIAL DECOMPRESSION ;  Surgeon: Tania Ade, MD;  Location: [facility];  Service: Orthopedics;  Laterality: Left;  Left  shoulder arthroscopy with debridement of rotator cuff and subacromial decompression    Social History   Social History  . Marital Status: Divorced    Spouse Name: N/A  . Number of Children: 2  . Years of Education: N/A   Occupational History  . PBX operator [employer]   Social History Main Topics  . Smoking status: Former Smoker -- 0.30 packs/day    Types: Cigarettes    Quit date: 08/29/1998  . Smokeless tobacco: Never Used     Comment: Quit in 2000   . Alcohol Use: Yes     Comment: 1 per week  . Drug Use: No  . Sexual Activity: Yes    Birth Control/ Protection: Surgical   Other Topics Concern  . Not on file   Social History Narrative   Works at [employer] as a Museum/gallery conservator    Current Outpatient Prescriptions on File Prior to Visit  Medication Sig Dispense Refill  . Alcohol Swabs (ALCOHOL PREPS) PADS 1 Device by Does not apply route 4 (four) times daily -  before meals and at bedtime. 100 each 11  . amitriptyline (ELAVIL) 25 MG tablet Take 0.5 tablets (12.5 mg total) by mouth at bedtime. 30 tablet 2  . aspirin 81 MG tablet Take 1 tablet (81 mg total) by mouth daily.    . Blood Glucose Monitoring Suppl (TRUE METRIX METER) w/Device KIT Use to check blood sugars twice  a day Dx E11.9 1 kit 0  . bromocriptine (PARLODEL) 2.5 MG tablet 1/4 tab daily (Patient taking differently: Take 1.25 mg by mouth daily. ) 8 tablet 11  . cetirizine (ZYRTEC) 10 MG tablet Take 1 tablet (10 mg total) by mouth daily. 30 tablet 11  . Cranberry-Vitamin C-Probiotic (AZO CRANBERRY PO) Take by mouth daily. Reported on 10/13/2015    . cyclobenzaprine (FLEXERIL) 5 MG tablet TAKE 1 TABLET BY MOUTH 3 TIMES DAILY AS NEEDED FOR MUSCLE SPASMS. 90 tablet 1  . docusate sodium (COLACE) 100 MG capsule Take 1 capsule (100 mg total) by mouth 3 (three) times daily as needed. 20 capsule 0  . Exenatide ER 2 MG PEN Inject 2 mg into the skin once a week. 4 each 6  . glimepiride (AMARYL) 4 MG tablet TAKE 1 TABLET (4  MG TOTAL) BY MOUTH DAILY WITH BREAKFAST. 90 tablet 3  . glucose blood (TRUE METRIX BLOOD GLUCOSE TEST) test strip 1 each by Other route 2 (two) times daily. Use to check blood sugars twice a day Dx E11.9 100 each 3  . LYRICA 150 MG capsule TAKE 1 CAPSULE BY MOUTH TWICE DAILY 60 capsule 2  . NONFORMULARY OR COMPOUNDED ITEM Take 1 tablet by mouth at bedtime. Reported on 03/08/2016    . ondansetron (ZOFRAN ODT) 8 MG disintegrating tablet Take 1 tablet (8 mg total) by mouth every 8 (eight) hours as needed for nausea or vomiting. 20 tablet 0  . OVER THE COUNTER MEDICATION 1 capsule daily.     . pantoprazole (PROTONIX) 20 MG tablet TAKE 1 TABLET BY MOUTH DAILY. 30 tablet PRN  . triamcinolone (NASACORT AQ) 55 MCG/ACT AERO nasal inhaler Place 2 sprays into the nose daily. 1 Inhaler 12  . TRUEPLUS LANCETS 28G MISC Use to help check blood sugars twice a day Dx E11.9 100 each 3  . TRUETEST TEST test strip USE AS INSTRUCTED 100 each 11  . lisinopril (PRINIVIL,ZESTRIL) 5 MG tablet TAKE 1 TABLET BY MOUTH DAILY. (Patient not taking: Reported on 03/09/2016) 30 tablet 5   No current facility-administered medications on file prior to visit.    Allergies  Allergen Reactions  . Citrus Itching  . Invokana [Canagliflozin] Other (See Comments)    Caused frequent vaginal yeast infections  . Codeine Hives  . Metformin And Related Diarrhea and Nausea And Vomiting  . Metoclopramide Hcl Nausea And Vomiting  . Penicillins Nausea And Vomiting  . Propoxyphene N-Acetaminophen Nausea And Vomiting  . Sulfonamide Derivatives Hives    Family History  Problem Relation Age of Onset  . Breast cancer Mother   . Ovarian cancer Maternal Grandmother   . Stomach cancer Cousin   . Diabetes Mother   . Heart disease Maternal Grandmother     great  . Diabetes Brother     x 3    BP 127/76 mmHg  Pulse 82  Temp(Src) 98.7 F (37.1 C) (Oral)  Ht _0  (1.676 m)  Wt 241 lb 12.8 oz (109.68 kg)  BMI 39.05 kg/m2  SpO2  97%  Review of Systems She denies hypoglycemia.     Objective:   Physical Exam VITAL SIGNS:  See vs page GENERAL: no distress Pulses: dorsalis pedis intact bilat.   MSK: no deformity of the feet.  CV: 1+ bilat leg edema.  Skin:  no ulcer on the feet.  normal color and temp on the feet. Neuro: sensation is intact to touch on the feet.   Lab Results  Component Value Date  HGBA1C 9.9 01/12/2016      Assessment & Plan:  Type 2 DM: she needs increased rx Obesity: persistent.  She needs surgery.  Patient is advised the following: Patient Instructions  I have sent a prescription to your pharmacy, to add "welchol."  check your blood sugar once a day.  vary the time of day when you check, between before the 3 meals, and at bedtime.  also check if you have symptoms of your blood sugar being too high or too low.  please keep a record of the readings and bring it to your next appointment here (or you can bring the meter itself).  You can write it on any piece of paper.  please call us sooner if your blood sugar goes below 70, or if you have a lot of readings over 200.  Please come back for a follow-up appointment in 6 weeks.   Renato Shin, MD

## 2016-03-09 NOTE — Patient Instructions (Addendum)
I have sent a prescription to your pharmacy, to add "welchol."  check your blood sugar once a day.  vary the time of day when you check, between before the 3 meals, and at bedtime.  also check if you have symptoms of your blood sugar being too high or too low.  please keep a record of the readings and bring it to your next appointment here (or you can bring the meter itself).  You can write it on any piece of paper.  please call us sooner if your blood sugar goes below 70, or if you have a lot of readings over 200.  Please come back for a follow-up appointment in 6 weeks.

## 2016-03-10 ENCOUNTER — Other Ambulatory Visit: Payer: Self-pay | Admitting: *Deleted

## 2016-03-10 MED FILL — BYDUREON 2 MG PEN INJECT: 2 | 28 days supply | Qty: 4 | Fill #6

## 2016-03-10 NOTE — Patient Outreach (Signed)
Per Latanza's request mailed her six  Emmi educational modules related to  weight loss surgery that entitled:  Weight Loss Bariatric Surgery: Your Decision;  The Basics; The Risks;  Restrictive Procedures;  Combination Procedures; and Life after surgery. Bary RichardJanet S. Hauser RN,CCM,CDE [facility] Care Management Coordinator Link To Wellness Office Phone [phone] Office Fax [phone]

## 2016-03-11 ENCOUNTER — Other Ambulatory Visit: Payer: Self-pay | Admitting: Endocrinology

## 2016-03-11 MED ORDER — BROMOCRIPTINE MESYLATE 2.5 MG PO TABS: 1.2500 mg | ORAL_TABLET | Freq: Every day | ORAL | Status: DC

## 2016-03-11 MED FILL — BROMOCRIPTINE 2.5 MG TABLET: 2.5 | 30 days supply | Qty: 15 | Fill #0

## 2016-03-14 MED FILL — PANTOPRAZOLE SOD DR 20 MG T: 20 | 90 days supply | Qty: 90 | Fill #2

## 2016-03-16 ENCOUNTER — Telehealth: Payer: Self-pay | Admitting: Internal Medicine

## 2016-03-16 NOTE — Telephone Encounter (Signed)
Called patient to advise that she needs a 30 min appt to have bariatric paperwork filled out. She has never seen dr crawford regarding this surgery. Called patient and no answer no vm. If patient calls back, please schedule 30 min appt

## 2016-03-17 ENCOUNTER — Other Ambulatory Visit: Payer: Self-pay

## 2016-03-17 ENCOUNTER — Ambulatory Visit (HOSPITAL_COMMUNITY)
Admission: RE | Admit: 2016-03-17 | Discharge: 2016-03-17 | Disposition: A | Discharge status: Home / Self Care | Payer: 59 | Source: Ambulatory Visit | Attending: General Surgery | Admitting: General Surgery

## 2016-03-17 DIAGNOSIS — K760 Fatty (change of) liver, not elsewhere classified: Secondary | ICD-10-CM | POA: Diagnosis not present

## 2016-03-17 DIAGNOSIS — R932 Abnormal findings on diagnostic imaging of liver and biliary tract: Secondary | ICD-10-CM | POA: Diagnosis not present

## 2016-03-17 DIAGNOSIS — R938 Abnormal findings on diagnostic imaging of other specified body structures: Secondary | ICD-10-CM | POA: Diagnosis not present

## 2016-03-21 ENCOUNTER — Encounter: Payer: Self-pay | Admitting: Internal Medicine

## 2016-03-21 ENCOUNTER — Ambulatory Visit (INDEPENDENT_AMBULATORY_CARE_PROVIDER_SITE_OTHER): Payer: 59 | Admitting: Internal Medicine

## 2016-03-21 DIAGNOSIS — E669 Obesity, unspecified: Secondary | ICD-10-CM | POA: Diagnosis not present

## 2016-03-21 DIAGNOSIS — K219 Gastro-esophageal reflux disease without esophagitis: Secondary | ICD-10-CM | POA: Diagnosis not present

## 2016-03-21 NOTE — Telephone Encounter (Signed)
Faxed and mailed letter to [facility] this morning after the patient's office visit.

## 2016-03-21 NOTE — Assessment & Plan Note (Signed)
Complicated by her diabetes, essential hypertension, hyperlipidemia, GERD, osteoarthritis. Will fill out letter of medical neccessity as well as paperwork for [facility] for recommendation of surgery to help get her diabetes and weight under control.

## 2016-03-21 NOTE — Assessment & Plan Note (Signed)
Suspect that this would be helped by her gastric bypass surgery if she completes that.

## 2016-03-21 NOTE — Progress Notes (Signed)
   Subjective:    Patient ID: Bridget [name], female    DOB: 02/07/1961, 55 y.o.   MRN: [id number]  HPI The patient is a 55 YO female coming in for discussion about bariatric surgery. She has tried increased exercise, dietician, otc weight loss products, green tea over the last 20 years. Spoke with the surgeon about doing weight loss surgery and she is contemplating gastric bypass.   Review of Systems  Constitutional: Positive for activity change, appetite change and fatigue. Negative for fever.  Respiratory: Negative for cough, chest tightness, shortness of breath and wheezing.   Cardiovascular: Negative for chest pain, palpitations and leg swelling.  Gastrointestinal: Negative for constipation, diarrhea and nausea.  Musculoskeletal: Positive for arthralgias, back pain and myalgias. Negative for gait problem.  Skin: Negative.   Neurological: Positive for numbness. Negative for dizziness, weakness and light-headedness.  Psychiatric/Behavioral: Positive for sleep disturbance.      Objective:   Physical Exam  Constitutional: She is oriented to person, place, and time. She appears well-developed and well-nourished. No distress.  Obese  HENT:  Head: Normocephalic and atraumatic.  Eyes: EOM are normal.  Neck: Normal range of motion.  Cardiovascular: Normal rate and regular rhythm.   Pulmonary/Chest: Effort normal and breath sounds normal. No respiratory distress. She has no wheezes. She has no rales.  Abdominal: Soft. She exhibits no distension. There is no rebound and no guarding.  Neurological: She is alert and oriented to person, place, and time. Coordination normal.  Skin: Skin is warm and dry.   Vitals:   03/21/16 0834  BP: 132/84  Pulse: 87  Resp: 18  Temp: 98.7 F (37.1 C)  TempSrc: Oral  SpO2: 96%  Weight: 241 lb (109.3 kg)  Height: 5' 5.5" (1.664 m)      Assessment & Plan:

## 2016-03-21 NOTE — Progress Notes (Signed)
Pre visit review using our clinic review tool, if applicable. No additional management support is needed unless otherwise documented below in the visit note. 

## 2016-03-21 NOTE — Patient Instructions (Signed)
We will fill out the paperwork to help recommend the weight loss surgery.

## 2016-03-28 ENCOUNTER — Encounter: Payer: 59 | Attending: General Surgery | Admitting: Skilled Nursing Facility1

## 2016-03-28 ENCOUNTER — Encounter: Payer: Self-pay | Admitting: Skilled Nursing Facility1

## 2016-03-28 DIAGNOSIS — E669 Obesity, unspecified: Secondary | ICD-10-CM

## 2016-03-28 DIAGNOSIS — Z713 Dietary counseling and surveillance: Secondary | ICD-10-CM | POA: Insufficient documentation

## 2016-03-28 NOTE — Progress Notes (Signed)
  Pre-Op Assessment Visit:  Pre-Operative roux-En-Y Bypass Surgery  Medical Nutrition Therapy:  Appt start time: 0930   End time:  1030.  Patient was seen on 03/28/2016 for Pre-Operative Nutrition Assessment. Assessment and letter of approval faxed to [facility] Bariatric Surgery Program coordinator on 03/28/2016.  Pt states due to Knee swelling she cannot walk or run. Pts A1C 9.9. Pt states she has a very stressful job.  Pt states she has pushed her surgery date back to next year because her daughter is pregnant and going to school. Pt states she works third shift. Pt states she does not need SWL. Preferred Learning Style:   Auditory   Learning Readiness:   Ready Handouts given during visit include:  Pre-Op Goals Bariatric Surgery Protein Shakes  During the appointment today the following Pre-Op Goals were reviewed with the patient: Maintain or lose weight as instructed by your surgeon Make healthy food choices Begin to limit portion sizes Limited concentrated sugars and fried foods Keep fat/sugar in the single digits per serving on   food labels Practice CHEWING your food  (aim for 30 chews per bite or until applesauce consistency) Practice not drinking 15 minutes before, during, and 30 minutes after each meal/snack Avoid all carbonated beverages  Avoid/limit caffeinated beverages  Avoid all sugar-sweetened beverages Consume 3 meals per day; eat every 3-5 hours Make a list of non-food related activities Aim for 64-100 ounces of FLUID daily  Aim for at least 60-80 grams of PROTEIN daily Look for a liquid protein source that contain ?15 g protein and ?5 g carbohydrate  (ex: shakes, drinks, shots)  Patient-Centered Goals: 5 specific/non-scale and confidence/importance scale 1-10  Demonstrated degree of understanding via:  Teach Back  Teaching Method Utilized:  Visual Auditory  Barriers to learning/adherence to lifestyle change: work stress  Patient to call  the [facility] to enroll in Pre-Op and Post-Op Nutrition Education when surgery date is scheduled.

## 2016-04-07 ENCOUNTER — Other Ambulatory Visit: Payer: Self-pay | Admitting: Internal Medicine

## 2016-04-07 MED FILL — BYDUREON 2 MG PEN INJECT: 2 | 28 days supply | Qty: 4 | Fill #0

## 2016-04-19 MED FILL — BROMOCRIPTINE 2.5 MG TABLET: 2.5 | 30 days supply | Qty: 15 | Fill #1

## 2016-04-20 ENCOUNTER — Encounter: Payer: Self-pay | Admitting: Endocrinology

## 2016-04-20 ENCOUNTER — Ambulatory Visit (INDEPENDENT_AMBULATORY_CARE_PROVIDER_SITE_OTHER): Payer: 59 | Admitting: Endocrinology

## 2016-04-20 VITALS — BP 112/70 | HR 85 | Ht 65.0 in | Wt 239.0 lb

## 2016-04-20 DIAGNOSIS — IMO0002 Reserved for concepts with insufficient information to code with codable children: Secondary | ICD-10-CM

## 2016-04-20 DIAGNOSIS — E1165 Type 2 diabetes mellitus with hyperglycemia: Secondary | ICD-10-CM | POA: Diagnosis not present

## 2016-04-20 DIAGNOSIS — E118 Type 2 diabetes mellitus with unspecified complications: Secondary | ICD-10-CM | POA: Diagnosis not present

## 2016-04-20 MED ORDER — INSULIN GLARGINE 100 UNIT/ML SOLOSTAR PEN: 30.0000 [IU] | PEN_INJECTOR | SUBCUTANEOUS | 99 refills | Status: DC

## 2016-04-20 MED FILL — LANTUS SOLOSTAR 100 UNITS/M: 100 | 30 days supply | Qty: 9 | Fill #0

## 2016-04-20 MED FILL — UNIFINE PENTIPS 31GX3/16: 31G X 5 MM | 90 days supply | Qty: 100 | Fill #0

## 2016-04-20 NOTE — Patient Instructions (Addendum)
I have sent a prescription to your pharmacy, to resume lantus. You can stop taking the bydureon. Please continue the same other diabetes medications for now, but we'll stop these with time.  check your blood sugar once a day.  vary the time of day when you check, between before the 3 meals, and at bedtime.  also check if you have symptoms of your blood sugar being too high or too low.  please keep a record of the readings and bring it to your next appointment here (or you can bring the meter itself).  You can write it on any piece of paper.  please call us sooner if your blood sugar goes below 70, or if you have a lot of readings over 200.  Please come back for a follow-up appointment in 3 weeks.

## 2016-04-20 NOTE — Progress Notes (Signed)
Subjective:    Patient ID: Bridget [name], female    DOB: 10-Mar-1961, 55 y.o.   MRN: [id number]  HPI Pt returns for f/u of diabetes mellitus: DM type: 2 Dx'ed: 7622 Complications: polyneuropathy.   Therapy: bydureon + 3 oral meds.  GDM: never DKA: never Severe hypoglycemia: never Pancreatitis: never.  Other: she did not tolerate invokana (vaginitis) or metformin-XR (diarrhea); edema precludes pioglitizone rx.  she took insulin from 2000-2003.  Interval history: she brings a record of her cbg's which i have reviewed today.  It varies from 120-200's.  pt states she feels well in general.  Past Medical History:  Diagnosis Date  . Bacterial vaginosis 12/07/2010  . Depression   . Diabetes mellitus without complication ([place])   . Diverticulosis   . DIVERTICULOSIS, COLON 10/26/2006  . Fibromyalgia   . GERD (gastroesophageal reflux disease)   . Glaucoma   . Headache   . HEEL SPUR 09/10/2007   Qualifier: Diagnosis of  By: Drue Flirt  MD, Merrily Brittle    . Hypertension   . MALAISE AND FATIGUE 05/27/2010  . Obesity   . OBESITY, NOS 10/26/2006  . Osteoarthritis   . OSTEOARTHRITIS, KNEE 04/27/2010  . Retained tampon 05/21/2012  . SHOULDER PAIN, LEFT 05/19/2010   Qualifier: Diagnosis of  By: Annamary Carolin MD, Amber    . Vaginal yeast infection 12/08/2010    Past Surgical History:  Procedure Laterality Date  . ABDOMINAL HYSTERECTOMY  1990  . ABDOMINAL HYSTERECTOMY    . BLADDER SURGERY  2001  . CATARACT EXTRACTION Right 2001   with implant  . EYE SURGERY     implant rt eye  . ROTATOR CUFF REPAIR Right   . SHOULDER ARTHROSCOPY WITH SUBACROMIAL DECOMPRESSION Left 07/20/2015   Procedure: SHOULDER ARTHROSCOPY WITH DEBRIDEMENT OF ROTATOR CUFF AND SUBACROMIAL DECOMPRESSION ;  Surgeon: Tania Ade, MD;  Location: [facility];  Service: Orthopedics;  Laterality: Left;  Left shoulder arthroscopy with debridement of rotator cuff and subacromial decompression    Social History   Social  History  . Marital status: Divorced    Spouse name: N/A  . Number of children: 2  . Years of education: N/A   Occupational History  . PBX operator [employer]   Social History Main Topics  . Smoking status: Former Smoker    Packs/day: 0.30    Types: Cigarettes    Quit date: 08/29/1998  . Smokeless tobacco: Never Used     Comment: Quit in 2000   . Alcohol use Yes     Comment: 1 per week  . Drug use: No  . Sexual activity: Yes    Birth control/ protection: Surgical   Other Topics Concern  . Not on file   Social History Narrative   Works at [employer] as a Museum/gallery conservator    Current Outpatient Prescriptions on File Prior to Visit  Medication Sig Dispense Refill  . Alcohol Swabs (ALCOHOL PREPS) PADS 1 Device by Does not apply route 4 (four) times daily -  before meals and at bedtime. 100 each 11  . amitriptyline (ELAVIL) 25 MG tablet Take 0.5 tablets (12.5 mg total) by mouth at bedtime. 30 tablet 2  . aspirin 81 MG tablet Take 1 tablet (81 mg total) by mouth daily.    . Blood Glucose Monitoring Suppl (TRUE METRIX METER) w/Device KIT Use to check blood sugars twice a day Dx E11.9 1 kit 0  . bromocriptine (PARLODEL) 2.5 MG tablet Take 0.5 tablets (1.25 mg total) by mouth  daily. 15 tablet 11  . cetirizine (ZYRTEC) 10 MG tablet Take 1 tablet (10 mg total) by mouth daily. 30 tablet 11  . colesevelam (WELCHOL) 625 MG tablet Take 2 tablets (1,250 mg total) by mouth 2 (two) times daily with a meal. 60 tablet 11  . Cranberry-Vitamin C-Probiotic (AZO CRANBERRY PO) Take by mouth daily. Reported on 10/13/2015    . cyclobenzaprine (FLEXERIL) 5 MG tablet TAKE 1 TABLET BY MOUTH 3 TIMES DAILY AS NEEDED FOR MUSCLE SPASMS. 90 tablet 1  . docusate sodium (COLACE) 100 MG capsule Take 1 capsule (100 mg total) by mouth 3 (three) times daily as needed. 20 capsule 0  . glimepiride (AMARYL) 4 MG tablet TAKE 1 TABLET (4 MG TOTAL) BY MOUTH DAILY WITH BREAKFAST. 90 tablet 3  . glucose blood (TRUE METRIX BLOOD  GLUCOSE TEST) test strip 1 each by Other route 2 (two) times daily. Use to check blood sugars twice a day Dx E11.9 100 each 3  . lisinopril (PRINIVIL,ZESTRIL) 5 MG tablet TAKE 1 TABLET BY MOUTH DAILY. 30 tablet 5  . LYRICA 150 MG capsule TAKE 1 CAPSULE BY MOUTH TWICE DAILY 60 capsule 2  . NALTREXONE HCL PO Take 3 mg by mouth daily.    . NONFORMULARY OR COMPOUNDED ITEM Take 1 tablet by mouth at bedtime. Reported on 03/08/2016    . ondansetron (ZOFRAN ODT) 8 MG disintegrating tablet Take 1 tablet (8 mg total) by mouth every 8 (eight) hours as needed for nausea or vomiting. 20 tablet 0  . OVER THE COUNTER MEDICATION 1 capsule daily.     . pantoprazole (PROTONIX) 20 MG tablet TAKE 1 TABLET BY MOUTH DAILY. 30 tablet PRN  . triamcinolone (NASACORT AQ) 55 MCG/ACT AERO nasal inhaler Place 2 sprays into the nose daily. 1 Inhaler 12  . TRUEPLUS LANCETS 28G MISC Use to help check blood sugars twice a day Dx E11.9 100 each 3  . TRUETEST TEST test strip USE AS INSTRUCTED 100 each 11   No current facility-administered medications on file prior to visit.     Allergies  Allergen Reactions  . Citrus Itching  . Invokana [Canagliflozin] Other (See Comments)    Caused frequent vaginal yeast infections  . Codeine Hives  . Metformin And Related Diarrhea and Nausea And Vomiting  . Metoclopramide Hcl Nausea And Vomiting  . Penicillins Nausea And Vomiting  . Propoxyphene N-Acetaminophen Nausea And Vomiting  . Sulfonamide Derivatives Hives    Family History  Problem Relation Age of Onset  . Breast cancer Mother   . Diabetes Mother   . Ovarian cancer Maternal Grandmother   . Heart disease Maternal Grandmother     great  . Stomach cancer Cousin   . Diabetes Brother     x 3    BP 112/70   Pulse 85   Ht '5\' 5"'  (1.651 m)   Wt 239 lb (108.4 kg)   SpO2 95%   BMI 39.77 kg/m   Review of Systems She denies hypoglycemia.      Objective:   Physical Exam VITAL SIGNS:  See vs page GENERAL: no  distress Pulses: dorsalis pedis intact bilat.   MSK: no deformity of the feet.  CV: trace bilat leg edema.  Skin:  no ulcer on the feet.  normal color and temp on the feet.  Neuro: sensation is intact to touch on the feet.  Ext: There is bilateral onychomycosis of the toenails.     A1c=9.5%    Assessment & Plan:  Type  2 DM: she has failed non-insulin rx.  We discussed.  She declines multiple daily injections.

## 2016-05-03 ENCOUNTER — Ambulatory Visit: Payer: Self-pay | Admitting: *Deleted

## 2016-05-06 ENCOUNTER — Other Ambulatory Visit: Payer: Self-pay | Admitting: *Deleted

## 2016-05-06 MED ORDER — GLIMEPIRIDE 4 MG PO TABS: ORAL_TABLET | ORAL | 1 refills | Status: DC

## 2016-05-06 MED FILL — GLIMEPIRIDE 4 MG TABLET: 4 | 90 days supply | Qty: 90 | Fill #0

## 2016-05-09 MED FILL — BROMOCRIPTINE 2.5 MG TABLET: 2.5 | 30 days supply | Qty: 15 | Fill #2

## 2016-05-11 ENCOUNTER — Ambulatory Visit (INDEPENDENT_AMBULATORY_CARE_PROVIDER_SITE_OTHER): Payer: 59 | Admitting: Endocrinology

## 2016-05-11 ENCOUNTER — Encounter: Payer: Self-pay | Admitting: Endocrinology

## 2016-05-11 VITALS — BP 132/84 | HR 77 | Ht 65.0 in | Wt 241.0 lb

## 2016-05-11 DIAGNOSIS — E1165 Type 2 diabetes mellitus with hyperglycemia: Secondary | ICD-10-CM

## 2016-05-11 DIAGNOSIS — E118 Type 2 diabetes mellitus with unspecified complications: Secondary | ICD-10-CM

## 2016-05-11 DIAGNOSIS — IMO0002 Reserved for concepts with insufficient information to code with codable children: Secondary | ICD-10-CM

## 2016-05-11 MED ORDER — INSULIN GLARGINE 100 UNIT/ML SOLOSTAR PEN: 40.0000 [IU] | PEN_INJECTOR | SUBCUTANEOUS | 99 refills | Status: DC

## 2016-05-11 NOTE — Patient Instructions (Addendum)
Please stop taking the glimepiride, and:  Increase the lantus to 40 units each morning.   check your blood sugar once a day.  vary the time of day when you check, between before the 3 meals, and at bedtime.  also check if you have symptoms of your blood sugar being too high or too low.  please keep a record of the readings and bring it to your next appointment here (or you can bring the meter itself).  You can write it on any piece of paper.  please call us sooner if your blood sugar goes below 70, or if you have a lot of readings over 200.  Please come back for a follow-up appointment in 2 months.

## 2016-05-11 NOTE — Progress Notes (Signed)
Subjective:    Patient ID: Bridget [name], female    DOB: 08/09/1961, 55 y.o.   MRN: [id number]  HPI Pt returns for f/u of diabetes mellitus: DM type: Insulin-requiring type 2 Dx'ed: 8527 Complications: polyneuropathy.   Therapy: insulin + 3 oral meds GDM: never DKA: never Severe hypoglycemia: never Pancreatitis: never.  Other: she did not tolerate invokana (vaginitis) or metformin-XR (diarrhea); edema precludes pioglitizone rx.  she took insulin from 2000-2003; in 2017, she resumed insulin.  She declines multiple daily injections;  Interval history: she brings a record of her cbg's which i have reviewed today.  It varies from 107-200's.  There is no trend throughout the day.  pt states she feels well in general.  She is pursuing weight loss surgery.  Past Medical History:  Diagnosis Date  . Bacterial vaginosis 12/07/2010  . Depression   . Diabetes mellitus without complication ([place])   . Diverticulosis   . DIVERTICULOSIS, COLON 10/26/2006  . Fibromyalgia   . GERD (gastroesophageal reflux disease)   . Glaucoma   . Headache   . HEEL SPUR 09/10/2007   Qualifier: Diagnosis of  By: Drue Flirt  MD, Merrily Brittle    . Hypertension   . MALAISE AND FATIGUE 05/27/2010  . Obesity   . OBESITY, NOS 10/26/2006  . Osteoarthritis   . OSTEOARTHRITIS, KNEE 04/27/2010  . Retained tampon 05/21/2012  . SHOULDER PAIN, LEFT 05/19/2010   Qualifier: Diagnosis of  By: Annamary Carolin MD, Amber    . Vaginal yeast infection 12/08/2010    Past Surgical History:  Procedure Laterality Date  . ABDOMINAL HYSTERECTOMY  1990  . ABDOMINAL HYSTERECTOMY    . BLADDER SURGERY  2001  . CATARACT EXTRACTION Right 2001   with implant  . EYE SURGERY     implant rt eye  . ROTATOR CUFF REPAIR Right   . SHOULDER ARTHROSCOPY WITH SUBACROMIAL DECOMPRESSION Left 07/20/2015   Procedure: SHOULDER ARTHROSCOPY WITH DEBRIDEMENT OF ROTATOR CUFF AND SUBACROMIAL DECOMPRESSION ;  Surgeon: Tania Ade, MD;  Location: [facility];  Service: Orthopedics;  Laterality: Left;  Left shoulder arthroscopy with debridement of rotator cuff and subacromial decompression    Social History   Social History  . Marital status: Divorced    Spouse name: N/A  . Number of children: 2  . Years of education: N/A   Occupational History  . 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 Smoking status: Former Smoker    Packs/day: 0.30    Types: Cigarettes    Quit date: 08/29/1998  . Smokeless tobacco: Never Used     Comment: Quit in 2000   . Alcohol use Yes     Comment: 1 per week  . Drug use: No  . Sexual activity: Yes    Birth control/ protection: Surgical   Other Topics Concern  . Not on file   Social History Narrative   Works at [employer] as a Museum/gallery conservator    Current Outpatient Prescriptions on File Prior to Visit  Medication Sig Dispense Refill  . Alcohol Swabs (ALCOHOL PREPS) PADS 1 Device by Does not apply route 4 (four) times daily -  before meals and at bedtime. 100 each 11  . amitriptyline (ELAVIL) 25 MG tablet Take 0.5 tablets (12.5 mg total) by mouth at bedtime. 30 tablet 2  . aspirin 81 MG tablet Take 1 tablet (81 mg total) by mouth daily.    . Blood Glucose Monitoring Suppl (TRUE METRIX METER) w/Device KIT Use  to check blood sugars twice a day Dx E11.9 1 kit 0  . bromocriptine (PARLODEL) 2.5 MG tablet Take 0.5 tablets (1.25 mg total) by mouth daily. 15 tablet 11  . cetirizine (ZYRTEC) 10 MG tablet Take 1 tablet (10 mg total) by mouth daily. 30 tablet 11  . colesevelam (WELCHOL) 625 MG tablet Take 2 tablets (1,250 mg total) by mouth 2 (two) times daily with a meal. 60 tablet 11  . Cranberry-Vitamin C-Probiotic (AZO CRANBERRY PO) Take by mouth daily. Reported on 10/13/2015    . cyclobenzaprine (FLEXERIL) 5 MG tablet TAKE 1 TABLET BY MOUTH 3 TIMES DAILY AS NEEDED FOR MUSCLE SPASMS. 90 tablet 1  . docusate sodium (COLACE) 100 MG capsule Take 1 capsule (100 mg total) by mouth 3 (three) times  daily as needed. 20 capsule 0  . glucose blood (TRUE METRIX BLOOD GLUCOSE TEST) test strip 1 each by Other route 2 (two) times daily. Use to check blood sugars twice a day Dx E11.9 100 each 3  . lisinopril (PRINIVIL,ZESTRIL) 5 MG tablet TAKE 1 TABLET BY MOUTH DAILY. 30 tablet 5  . LYRICA 150 MG capsule TAKE 1 CAPSULE BY MOUTH TWICE DAILY 60 capsule 2  . NALTREXONE HCL PO Take 3 mg by mouth daily.    . NONFORMULARY OR COMPOUNDED ITEM Take 1 tablet by mouth at bedtime. Reported on 03/08/2016    . ondansetron (ZOFRAN ODT) 8 MG disintegrating tablet Take 1 tablet (8 mg total) by mouth every 8 (eight) hours as needed for nausea or vomiting. 20 tablet 0  . OVER THE COUNTER MEDICATION 1 capsule daily.     . pantoprazole (PROTONIX) 20 MG tablet TAKE 1 TABLET BY MOUTH DAILY. 30 tablet PRN  . triamcinolone (NASACORT AQ) 55 MCG/ACT AERO nasal inhaler Place 2 sprays into the nose daily. 1 Inhaler 12  . TRUEPLUS LANCETS 28G MISC Use to help check blood sugars twice a day Dx E11.9 100 each 3  . TRUETEST TEST test strip USE AS INSTRUCTED 100 each 11   No current facility-administered medications on file prior to visit.     Allergies  Allergen Reactions  . Citrus Itching  . Invokana [Canagliflozin] Other (See Comments)    Caused frequent vaginal yeast infections  . Codeine Hives  . Metformin And Related Diarrhea and Nausea And Vomiting  . Metoclopramide Hcl Nausea And Vomiting  . Penicillins Nausea And Vomiting  . Propoxyphene N-Acetaminophen Nausea And Vomiting  . Sulfonamide Derivatives Hives    Family History  Problem Relation Age of Onset  . Breast cancer Mother   . Diabetes Mother   . Ovarian cancer Maternal Grandmother   . Heart disease Maternal Grandmother     great  . Stomach cancer Cousin   . Diabetes Brother     x 3    BP 132/84   Pulse 77   Ht '5\' 5"'  (1.651 m)   Wt 241 lb (109.3 kg)   BMI 40.10 kg/m   Review of Systems She denies hypoglycemia.    Objective:   Physical  Exam VITAL SIGNS:  See vs page GENERAL: no distress Pulses: dorsalis pedis intact bilat.   MSK: no deformity of the feet CV: trace bilat leg edema Skin:  no ulcer on the feet.  normal color and temp on the feet. Neuro: sensation is intact to touch on the feet.        Assessment & Plan:  Insulin-requiring type 2 DM: she needs increased rx.  We discussed oral rx in  addition to insulin.  she wants to continue 2 DM orals for now.

## 2016-05-19 ENCOUNTER — Other Ambulatory Visit: Payer: Self-pay | Admitting: *Deleted

## 2016-05-19 NOTE — Patient Outreach (Signed)
[facility]) Care Management   05/19/2016  Bridget [name] 12-27-1960 [id number]  Bridget [name] is an 55 y.o. female who presents to the [facility] office for routine Link To Wellness follow up for self management assistance with Type II DM, HTN, hyperlipidemia and obesity.  Subjective: Bridget [name] is complaining of bilateral knee and back soreness, these are chronic issues for her. She says she was dismissed form the [facility] because her urine drug screen showed marijuana metabolites and she denied that she smoked it at the time of the screening because she had only smoke it once and it was > month when the urine test was done.  She says she has not seen a big difference in her blood sugars since she was started back on Lantus insulin on 8/23 by Dr. Loanne Drilling. She also admits she is not checking her blood sugar everyday as directed. She says Dr. Loanne Drilling stopped her glimepiride on 9/13 due to ineffectiveness from chronic use.  She says she meet with the RD about metabolic surgery but says she will not pursue the surgery this year because she will not have anyone to help her at home post-op as her daughter is in school and is pregnant with her second child and due in February.  Objective:   Review of Systems  Constitutional: Negative.     Physical Exam  Constitutional: She is oriented to person, place, and time. She appears well-developed and well-nourished.  Respiratory: Effort normal.  Neurological: She is alert and oriented to person, place, and time.  Skin: Skin is warm and dry.  Psychiatric: She has a normal mood and affect. Her behavior is normal. Judgment and thought content normal.   Self monitored pre meal CBG= 253  Filed Weights   05/19/16 0743  Weight: 241 lb 9.6 oz (109.6 kg)   Vitals:   05/19/16 0743  BP: 115/80   Encounter Medications:   Outpatient Encounter Prescriptions as of 05/19/2016  Medication  Sig Note  . Alcohol Swabs (ALCOHOL PREPS) PADS 1 Device by Does not apply route 4 (four) times daily -  before meals and at bedtime.   Marland Kitchen amitriptyline (ELAVIL) 25 MG tablet Take 0.5 tablets (12.5 mg total) by mouth at bedtime.   Marland Kitchen aspirin 81 MG tablet Take 1 tablet (81 mg total) by mouth daily.   . Blood Glucose Monitoring Suppl (TRUE METRIX METER) w/Device KIT Use to check blood sugars twice a day Dx E11.9   . bromocriptine (PARLODEL) 2.5 MG tablet Take 0.5 tablets (1.25 mg total) by mouth daily.   . cetirizine (ZYRTEC) 10 MG tablet Take 1 tablet (10 mg total) by mouth daily. 05/19/2016: Takes prn  . colesevelam (WELCHOL) 625 MG tablet Take 2 tablets (1,250 mg total) by mouth 2 (two) times daily with a meal.   . Cranberry-Vitamin C-Probiotic (AZO CRANBERRY PO) Take by mouth daily. Reported on 10/13/2015 03/08/2016: Takes prn  . cyclobenzaprine (FLEXERIL) 5 MG tablet TAKE 1 TABLET BY MOUTH 3 TIMES DAILY AS NEEDED FOR MUSCLE SPASMS.   Marland Kitchen docusate sodium (COLACE) 100 MG capsule Take 1 capsule (100 mg total) by mouth 3 (three) times daily as needed.   Marland Kitchen glucose blood (TRUE METRIX BLOOD GLUCOSE TEST) test strip 1 each by Other route 2 (two) times daily. Use to check blood sugars twice a day Dx E11.9   . Insulin Glargine (LANTUS SOLOSTAR) 100 UNIT/ML Solostar Pen Inject 40 Units into the skin every morning. And  pen needles 1/day   . lisinopril (PRINIVIL,ZESTRIL) 5 MG tablet TAKE 1 TABLET BY MOUTH DAILY.   Marland Kitchen LYRICA 150 MG capsule TAKE 1 CAPSULE BY MOUTH TWICE DAILY   . ondansetron (ZOFRAN ODT) 8 MG disintegrating tablet Take 1 tablet (8 mg total) by mouth every 8 (eight) hours as needed for nausea or vomiting.   Marland Kitchen OVER THE COUNTER MEDICATION 1 capsule daily.  03/08/2016: Curamin is curcumin and boswella and DLPA and nattokinase- she takes it for chronic  pain and inflammation  . pantoprazole (PROTONIX) 20 MG tablet TAKE 1 TABLET BY MOUTH DAILY.   Marland Kitchen triamcinolone (NASACORT AQ) 55 MCG/ACT AERO nasal inhaler  Place 2 sprays into the nose daily.   . TRUEPLUS LANCETS 28G MISC Use to help check blood sugars twice a day Dx E11.9   . TRUETEST TEST test strip USE AS INSTRUCTED 03/08/2016: Using True Metrix  . NALTREXONE HCL PO Take 3 mg by mouth daily. 05/19/2016: Did not tolerate due to headaches  . NONFORMULARY OR COMPOUNDED ITEM Take 1 tablet by mouth at bedtime. Reported on 03/08/2016 05/19/2016: Did not tolerate it due to headaches   No facility-administered encounter medications on file as of 05/19/2016.     Functional Status:   In your present state of health, do you have any difficulty performing the following activities: 03/08/2016 02/08/2016  Hearing? N N  Vision? N Y  Difficulty concentrating or making decisions? N N  Walking or climbing stairs? Y Y  Dressing or bathing? N N  Doing errands, shopping? N N  Some recent data might be hidden    Fall/Depression Screening:    PHQ 2/9 Scores 02/08/2016 01/12/2016 12/15/2014 09/14/2014 09/27/2013 08/07/2013 12/08/2011  PHQ - 2 Score 0 0 1 0 0 0 6  PHQ- 9 Score - - - - - - 6  Exception Documentation Patient refusal - - - - - -    Assessment:   [employer] employee and Link To Wellness member with Type II DM, HTN, hyperlipidemia and obesity.  Plan:   [facility] CM Care Plan Problem One   Flowsheet Row Most Recent Value  Care Plan Problem One Link To Wellness member with Type II DM, HTN hyperlipidemia and morbid obesity,   not meeting Hgb A1C target of <7.0% as evidenced by POC Hgb A1C= 9.9% on 01/12/16, previous Hgb A1C= = 7.4% on 09/09/15, meeting treatment targets for HTN,  hyperlipidemia with worsening lipid control as evidenced by increased total cholesterol (202) , triglycerides (227) and LDL (119) on lipid profile of 09/09/15- not on statin,  and no weight gain since last visit in July  Role Documenting the Problem One  Care Management [place] for Problem One  Active  [facility] Long Term Goal (31-90 days) Improved glycemic control as evidenced by  Hgb A1C<9.0% at next check, evidenced of recording daily CBG self monitoring at varying times per MD instructions,  improved lipid control as evidenced by normal lipid panel at next check, ongoing good control of HTN as evidenced by BP readings <140/<90 and weight loss or no weight gain at next assessment   [facility] Long Term Goal Start Date 05/19/16  [facility] Long Term Goal Met Date    Interventions for Problem One Long Term Goal Allowed  Ravenne to express her frustration with being dismissed from the [facility], reviewed medications and assessed adherence, allowed Zauria to voice her ongoing frustration with her health issues,reviewed Dr. Cordelia Pen instructions given to her regarding, daily CBG checks  at various times, recording them and bringing them to her follow up appointments with him,  observed while Khaniyah checked her  blood sugar and discussed likely reason of high value of 253 despite medication adherence,  reviewed upcoming appointments with Dr. Loanne Drilling on 07/11/16, discussed changes to the Link To Wellness program for 2018, arranged for Link To Wellness follow up in November    RNCM to fax today's office visit note to Dr. Sharlet Salina and Dr. Loanne Drilling. RNCM will meet quarterly and as needed with patient per Link To Wellness program guidelines to assist with Type II DM, HTN, hyperlipidemia and obesity self-management and assess patient's progress toward mutually set goals.  Barrington Ellison RN,CCM,CDE [place] Management Coordinator Link To Wellness Office Phone [phone] Office Fax [phone]

## 2016-05-23 ENCOUNTER — Other Ambulatory Visit: Payer: Self-pay | Admitting: Internal Medicine

## 2016-05-24 ENCOUNTER — Telehealth: Payer: Self-pay | Admitting: *Deleted

## 2016-05-24 MED FILL — LYRICA 150 MG CAPSULE: 150 | 30 days supply | Qty: 60 | Fill #0

## 2016-05-24 NOTE — Telephone Encounter (Signed)
Rec'd fax pt requesting refill on Lyrica...Raechel Chute/lmb

## 2016-05-24 NOTE — Telephone Encounter (Signed)
Refilled this morning already.

## 2016-06-08 ENCOUNTER — Other Ambulatory Visit: Payer: Self-pay | Admitting: Internal Medicine

## 2016-06-08 MED FILL — LISINOPRIL 5 MG TABLET: 5 | 90 days supply | Qty: 90 | Fill #0

## 2016-06-13 ENCOUNTER — Encounter: Payer: Self-pay | Admitting: Internal Medicine

## 2016-06-16 ENCOUNTER — Other Ambulatory Visit: Payer: Self-pay | Admitting: *Deleted

## 2016-06-16 ENCOUNTER — Encounter: Payer: Self-pay | Admitting: Geriatric Medicine

## 2016-06-16 NOTE — Patient Outreach (Signed)
[facility]) Care Management   06/16/2016  Bridget [name] August 23, 1961 [id number]  Bridget [name] is an 55 y.o. female who presents to the [place] Management office for routine Link To Wellness follow up for self management assistance with Type II DM, HTN, hyperlipidemia and obesity.  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 She says she often forgets to take her oral scheduled medications but does not forgot her insulin. She says she still has not seen a big difference in her blood sugars since she was started back on Lantus insulin on 8/23 by Dr. Loanne Drilling. She also admits she is not checking her blood sugar everyday as directed and is not consistently following a CHO controlled meal plan.  She says Dr. Loanne Drilling stopped her glimepiride on 05/11/16 due to ineffectiveness from chronic use.  She says she met with the RD about metabolic surgery on 8/25 but says she will not pursue the surgery this year because she will not have anyone to help her at home post-op as her daughter is in school and is pregnant with her second child and due on February 14th, 2018. Bridget [name] says she would like to have the surgery in mid to late 2018.   Objective:   Review of Systems  Constitutional: Negative.     Physical Exam  Constitutional: She is oriented to person, place, and time. She appears well-developed and well-nourished.  Respiratory: Effort normal.  Neurological: She is alert and oriented to person, place, and time.  Skin: Skin is warm and dry.  Psychiatric: She has a normal mood and affect. Her behavior is normal. Judgment and thought content normal.   Self monitored pre meal CBG= 213  Filed Weights   06/16/16 0740  Weight: 242 lb 9.6 oz (110 kg)   Vitals:   06/16/16 0740  BP: 122/78   Encounter Medications:   Outpatient Encounter Prescriptions as of 06/16/2016  Medication  Sig Note  . Alcohol Swabs (ALCOHOL PREPS) PADS 1 Device by Does not apply route 4 (four) times daily -  before meals and at bedtime.   Marland Kitchen amitriptyline (ELAVIL) 25 MG tablet Take 0.5 tablets (12.5 mg total) by mouth at bedtime.   Marland Kitchen aspirin 81 MG tablet Take 1 tablet (81 mg total) by mouth daily.   . Blood Glucose Monitoring Suppl (TRUE METRIX METER) w/Device KIT Use to check blood sugars twice a day Dx E11.9   . cetirizine (ZYRTEC) 10 MG tablet Take 1 tablet (10 mg total) by mouth daily. 05/19/2016: Takes prn  . colesevelam (WELCHOL) 625 MG tablet Take 2 tablets (1,250 mg total) by mouth 2 (two) times daily with a meal.   . Cranberry-Vitamin C-Probiotic (AZO CRANBERRY PO) Take by mouth daily. Reported on 10/13/2015 03/08/2016: Takes prn  . pantoprazole (PROTONIX) 20 MG tablet TAKE 1 TABLET BY MOUTH DAILY.   Marland Kitchen triamcinolone (NASACORT AQ) 55 MCG/ACT AERO nasal inhaler Place 2 sprays into the nose daily.   . TRUEPLUS LANCETS 28G MISC Use to help check blood sugars twice a day Dx E11.9   . TRUETEST TEST test strip USE AS INSTRUCTED 03/08/2016: Using True Metrix  . bromocriptine (PARLODEL) 2.5 MG tablet Take 0.5 tablets (1.25 mg total) by mouth daily. (Patient not taking: Reported on 06/16/2016)   . cyclobenzaprine (FLEXERIL) 5 MG tablet TAKE 1 TABLET BY MOUTH 3 TIMES DAILY AS NEEDED FOR MUSCLE SPASMS.   Marland Kitchen docusate sodium (COLACE) 100  MG capsule Take 1 capsule (100 mg total) by mouth 3 (three) times daily as needed.   Marland Kitchen glucose blood (TRUE METRIX BLOOD GLUCOSE TEST) test strip 1 each by Other route 2 (two) times daily. Use to check blood sugars twice a day Dx E11.9   . Insulin Glargine (LANTUS SOLOSTAR) 100 UNIT/ML Solostar Pen Inject 40 Units into the skin every morning. And pen needles 1/day 05/19/2016: Takes in the morning around 9 am before she goes to sleep  . lisinopril (PRINIVIL,ZESTRIL) 5 MG tablet TAKE 1 TABLET BY MOUTH DAILY.   Marland Kitchen LYRICA 150 MG capsule TAKE 1 CAPSULE BY MOUTH TWICE DAILY   .  NALTREXONE HCL PO Take 3 mg by mouth daily. 05/19/2016: Did not tolerate due to headaches  . NONFORMULARY OR COMPOUNDED ITEM Take 1 tablet by mouth at bedtime. Reported on 03/08/2016 05/19/2016: Did not tolerate it due to headaches  . ondansetron (ZOFRAN ODT) 8 MG disintegrating tablet Take 1 tablet (8 mg total) by mouth every 8 (eight) hours as needed for nausea or vomiting.   Marland Kitchen OVER THE COUNTER MEDICATION 1 capsule daily.  03/08/2016: Curamin is curcumin and boswella and DLPA and nattokinase- she takes it for chronic  pain and inflammation   No facility-administered encounter medications on file as of 06/16/2016.     Functional Status:   In your present state of health, do you have any difficulty performing the following activities: 03/08/2016 02/08/2016  Hearing? N N  Vision? N Y  Difficulty concentrating or making decisions? N N  Walking or climbing stairs? Y Y  Dressing or bathing? N N  Doing errands, shopping? N N  Some recent data might be hidden    Fall/Depression Screening:    PHQ 2/9 Scores 02/08/2016 01/12/2016 12/15/2014 09/14/2014 09/27/2013 08/07/2013 12/08/2011  PHQ - 2 Score 0 0 1 0 0 0 6  PHQ- 9 Score - - - - - - 6  Exception Documentation Patient refusal - - - - - -    Assessment:   [employer] employee and Link To Wellness member with Type II DM, HTN, hyperlipidemia  (on no medications)  and obesity.  Plan:   [facility] CM Care Plan Problem One   Flowsheet Row Most Recent Value  Care Plan Problem One Link To Wellness member with Type II DM, HTN hyperlipidemia and morbid obesity,   not meeting Hgb A1C target of <7.0% as evidenced by POC Hgb A1C= 9.9% on 01/12/16, previous Hgb A1C= = 7.4% on 09/09/15, meeting treatment targets for HTN,  hyperlipidemia with worsening lipid control as evidenced by increased total cholesterol (202) , triglycerides (227) and LDL (119) on lipid profile of 09/09/15- not on statin,  one lb weight gain in last month  Role Documenting the Problem One  Care  Management [place] for Problem One  Active  THN Long Term Goal (31-90 days) Improved glycemic control as evidenced by Hgb A1C<9.0% at next check, evidenced of recording daily CBG self monitoring at varying times per MD instructions,  improved lipid control as evidenced by normal lipid panel at next check, ongoing good control of HTN as evidenced by BP readings <140/<90 and weight loss or no weight gain at next assessment   THN Long Term Goal Start Date 06/16/16  [facility] Long Term Goal Met Date    Interventions for Problem One Long Term Goal reviewed Dr. Cordelia Pen instructions given to her on 9/13 after the office visit regarding daily CBG checks at various times, recording them  and bringing them with her meter to her follow up appointments with him,  reviewed blood sugar log and glucose meter history and discussed probable reasons for elevated blood sugars and strategies to improve glucose control,  reviewed upcoming appointments with Dr. Loanne Drilling on 07/11/16, discussed changes to the Link To Wellness program for 2018 and assisted Shane with enrolling in the [place] program, arranged for Link To Wellness follow up in December    RNCM to fax today's office visit note to Dr. Sharlet Salina and Dr. Loanne Drilling. RNCM will meet quarterly and as needed with patient per Link To Wellness program guidelines to assist with Type II DM, HTN, hyperlipidemia and obesity self-management and assess patient's progress toward mutually set goals.  Barrington Ellison RN,CCM,CDE [place] Management Coordinator Link To Wellness Office Phone [phone] Office Fax [phone]

## 2016-06-22 IMAGING — DX DG FOOT COMPLETE 3+V*R*
3 series · 3 of 3 positions shown · non-contrast
Comparison: March 11, 2011

CLINICAL DATA: Patient hit second toe on side of bathtub

EXAM:
RIGHT FOOT COMPLETE - 3+ VIEW

[foot ap]
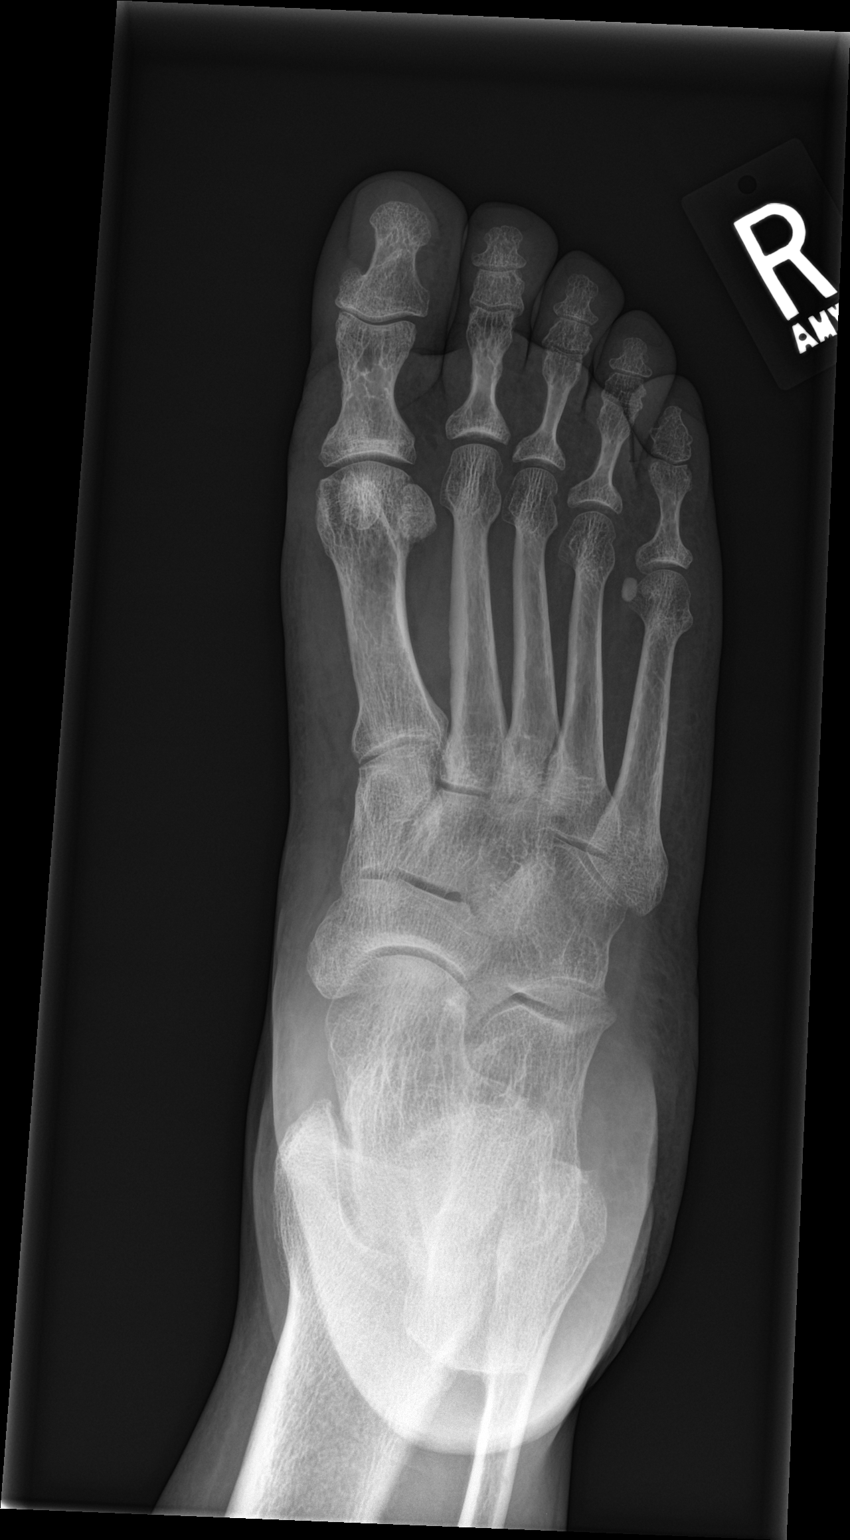

[foot obl]
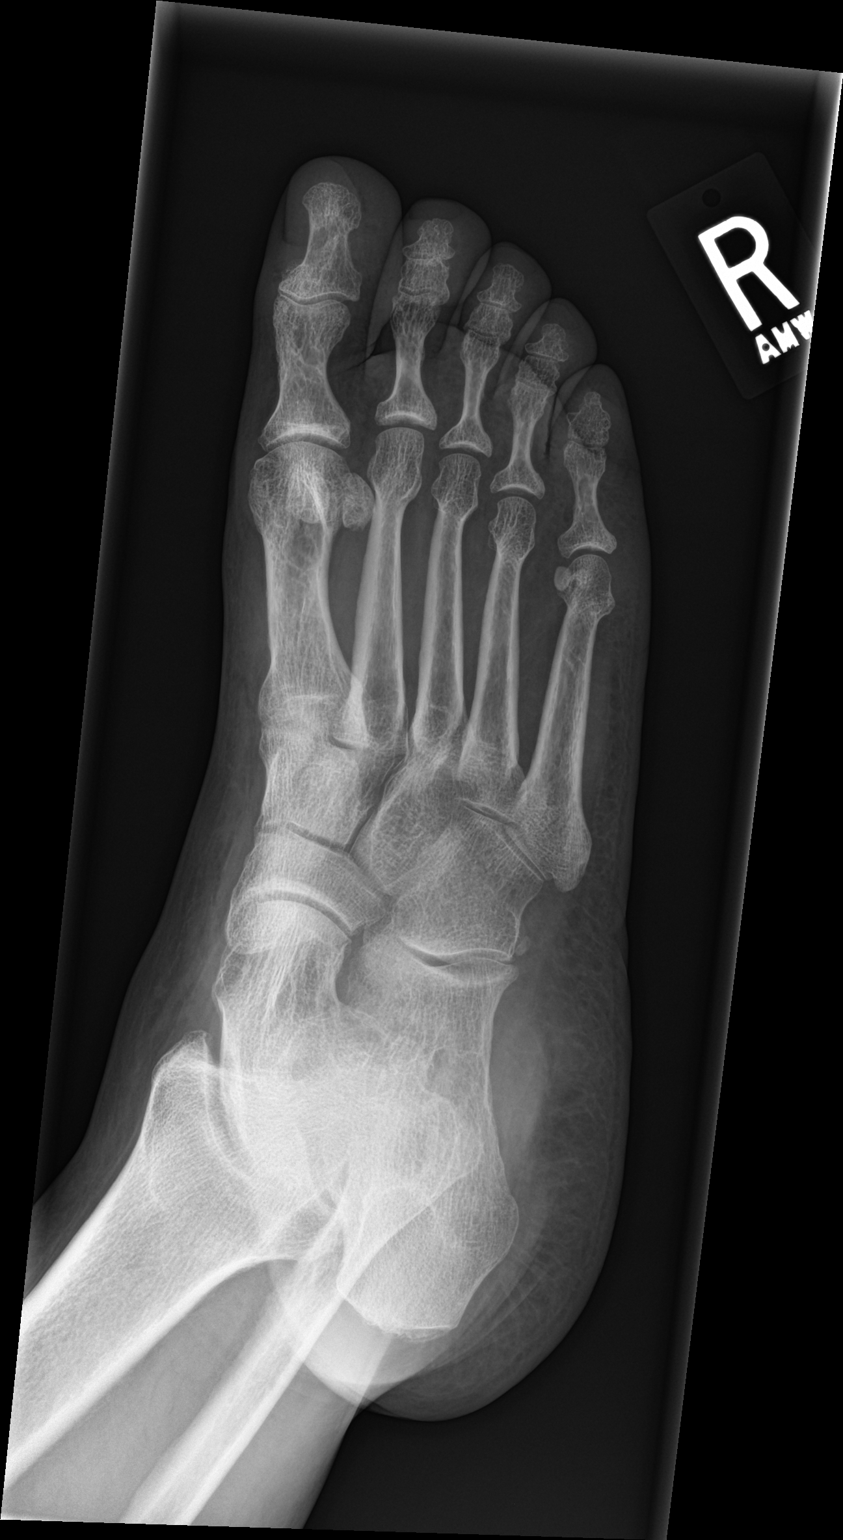

[foot lat]
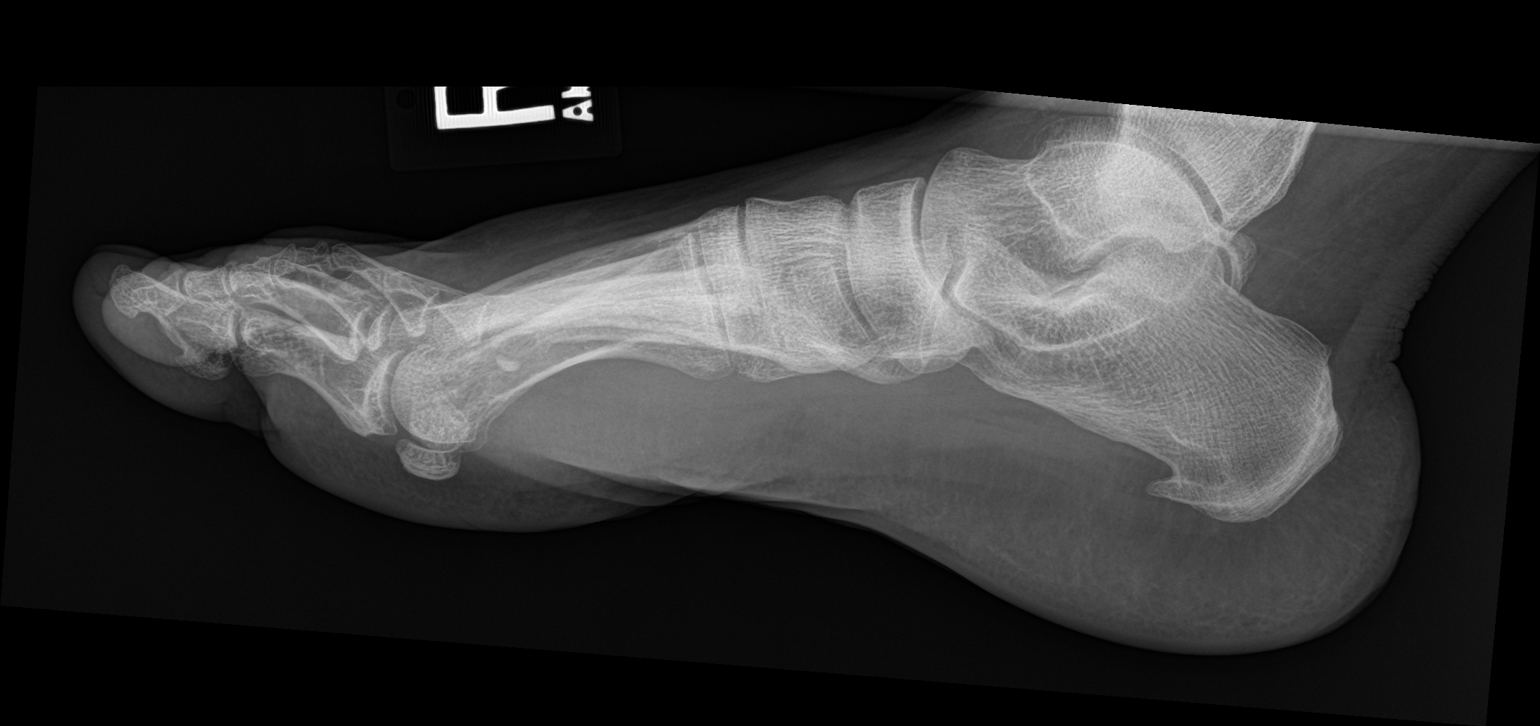

[3 of 3 positions shown; findings below may reference images not displayed]

FINDINGS: Frontal, oblique, and lateral views obtained. There is no
appreciable fracture or dislocation. There is slight osteoarthritic
change in the first MTP joint, stable. Other joint spaces appear
normal. No erosive change. There is an inferior calcaneal spur.
IMPRESSION: No fracture or dislocation. Stable mild osteoarthritic change in the
first MTP joint. Inferior calcaneal spur present.

## 2016-06-23 ENCOUNTER — Ambulatory Visit: Payer: 59 | Admitting: *Deleted

## 2016-06-28 MED FILL — PANTOPRAZOLE SOD DR 20 MG T: 20 | 90 days supply | Qty: 90 | Fill #3

## 2016-06-28 MED FILL — BROMOCRIPTINE 2.5 MG TABLET: 2.5 | 30 days supply | Qty: 15 | Fill #3

## 2016-07-04 MED FILL — LANTUS SOLOSTAR 100 UNITS/M: 100 | 30 days supply | Qty: 9 | Fill #1

## 2016-07-10 NOTE — Progress Notes (Signed)
   Subjective:    Patient ID: Bridget [name], female    DOB: 30-Jun-1961, 55 y.o.   MRN: [id number]  HPI Pt returns for f/u of diabetes mellitus: DM type: Insulin-requiring type 2 Dx'ed: 2000 Complications: polyneuropathy.   Therapy: insulin + 2 oral meds GDM: never DKA: never Severe hypoglycemia: never Pancreatitis: never.  Other: she did not tolerate invokana (vaginitis) or metformin-XR (diarrhea); edema precludes pioglitizone rx.  she took insulin from 2000-2003; in 2017, she resumed insulin.  She declines multiple daily injections; she works 3rd shift.  Interval history: no cbg record, but states cbg's are in the 200's.  There is no trend throughout the day.  pt states she feels well in general.  She is still pursuing weight loss surgery.  Pt says she misses the insulin 1-2 times per week.     Review of Systems She denies hypoglycemia.      Objective:   Physical Exam VITAL SIGNS:  See vs page GENERAL: no distress Pulses: dorsalis pedis intact bilat.   MSK: no deformity of the feet CV: 1+ bilat leg edema Skin:  no ulcer on the feet.  normal color and temp on the feet. Neuro: sensation is intact to touch on the feet.    A1c=9.9%    Assessment & Plan:  Insulin-requiring type 2 DM, with polyneuropathy: she needs increased rx.  Noncompliance with cbg recording and insulin: tresiba would be better.  Patient is advised the following: Patient Instructions  Please change the lantus to Tresiba, 50 units each morning, and:  continue the same diabetes pills.  check your blood sugar once a day.  vary the time of day when you check, between before the 3 meals, and at bedtime.  also check if you have symptoms of your blood sugar being too high or too low.  please keep a record of the readings and bring it to your next appointment here (or you can bring the meter itself).  You can write it on any piece of paper.  please call us sooner if your blood sugar goes below 70, or if you have a  lot of readings over 200.  With the [place]-Bissauresiba, if you miss the insulin, you can still take the full amount 12 hours later.   Please come back for a follow-up appointment in 2 months.

## 2016-07-11 ENCOUNTER — Ambulatory Visit (INDEPENDENT_AMBULATORY_CARE_PROVIDER_SITE_OTHER): Payer: 59 | Admitting: Endocrinology

## 2016-07-11 ENCOUNTER — Encounter: Payer: Self-pay | Admitting: Endocrinology

## 2016-07-11 VITALS — BP 132/84 | HR 77 | Ht 65.0 in | Wt 243.0 lb

## 2016-07-11 DIAGNOSIS — E1165 Type 2 diabetes mellitus with hyperglycemia: Secondary | ICD-10-CM | POA: Diagnosis not present

## 2016-07-11 DIAGNOSIS — E118 Type 2 diabetes mellitus with unspecified complications: Secondary | ICD-10-CM | POA: Diagnosis not present

## 2016-07-11 DIAGNOSIS — Z794 Long term (current) use of insulin: Secondary | ICD-10-CM | POA: Diagnosis not present

## 2016-07-11 DIAGNOSIS — IMO0002 Reserved for concepts with insufficient information to code with codable children: Secondary | ICD-10-CM

## 2016-07-11 LAB — POCT GLYCOSYLATED HEMOGLOBIN (HGB A1C): Hemoglobin A1C: 9.9

## 2016-07-11 MED ORDER — INSULIN DEGLUDEC 100 UNIT/ML ~~LOC~~ SOPN: 50.0000 [IU] | PEN_INJECTOR | Freq: Every day | SUBCUTANEOUS | 11 refills | Status: DC

## 2016-07-11 MED FILL — TRESIBA FLEXTOUCH 100 UNITS: 100 | 30 days supply | Qty: 15 | Fill #0

## 2016-07-11 NOTE — Addendum Note (Signed)
Addended by: Ann MakiBAILEY, Ayauna Mcnay T on: 07/11/2016 09:55 AM   Modules accepted: Orders

## 2016-07-11 NOTE — Patient Instructions (Addendum)
Please change the lantus to Tresiba, 50 units each morning, and:  continue the same diabetes pills.  check your blood sugar once a day.  vary the time of day when you check, between before the 3 meals, and at bedtime.  also check if you have symptoms of your blood sugar being too high or too low.  please keep a record of the readings and bring it to your next appointment here (or you can bring the meter itself).  You can write it on any piece of paper.  please call us sooner if your blood sugar goes below 70, or if you have a lot of readings over 200.  With the Guinea-Bissauresiba, if you miss the insulin, you can still take the full amount 12 hours later.   Please come back for a follow-up appointment in 2 months.

## 2016-07-12 ENCOUNTER — Other Ambulatory Visit: Payer: Self-pay | Admitting: *Deleted

## 2016-07-12 NOTE — Patient Outreach (Signed)
[facility] Dr[facility] Everardo AllEllison yesterday and her Hgb A1C was 9.9% so she was prescribed Tresiba to replace Lantus. She had the [place]-Bissauresiba filled today and was charged a copay of $25 so she is requesting this RNCM message Dr. Everardo AllEllison that she will not be able to afford the Evaristo Buryresiba copay beyond the first fill and requests to use an insulin that would cost less.  This RNCM reviewed Dr. George HughEllison's office visit [facility] of [facility] and then sent secure [facility]-mail  [facility] advising her of the reason that Dr. Everardo AllEllison changed her Evaristo Buryresiba to Lantus and suggested she take the [place]-Bissauresiba as prescribed as well as focusing on lifestyle modifications and glucose self monitoring.  Advised Luane that the only long acting insulin that will be covered at no cost under the [employer] program or [employer] program in 2018 is Lantus.  Bary RichardJanet S. Mistey Hoffert RN,CCM,CDE [facility] Care Management Coordinator Link To Wellness Office Phone [phone] Office Fax [phone]

## 2016-08-03 ENCOUNTER — Other Ambulatory Visit: Payer: Self-pay | Admitting: Internal Medicine

## 2016-08-03 DIAGNOSIS — Z1231 Encounter for screening mammogram for malignant neoplasm of breast: Secondary | ICD-10-CM

## 2016-08-03 DIAGNOSIS — Z01411 Encounter for gynecological examination (general) (routine) with abnormal findings: Secondary | ICD-10-CM | POA: Diagnosis not present

## 2016-08-03 DIAGNOSIS — Z6841 Body Mass Index (BMI) 40.0 and over, adult: Secondary | ICD-10-CM | POA: Diagnosis not present

## 2016-08-03 DIAGNOSIS — A6000 Herpesviral infection of urogenital system, unspecified: Secondary | ICD-10-CM | POA: Diagnosis not present

## 2016-08-03 MED FILL — VALACYCLOVIR HCL 500 MG TAB: 500 | 30 days supply | Qty: 30 | Fill #0

## 2016-08-04 MED FILL — TRESIBA FLEXTOUCH 100 UNITS: 100 | 30 days supply | Qty: 15 | Fill #1

## 2016-08-04 MED FILL — BROMOCRIPTINE 2.5 MG TABLET: 2.5 | 30 days supply | Qty: 15 | Fill #4

## 2016-08-11 ENCOUNTER — Other Ambulatory Visit: Payer: Self-pay | Admitting: *Deleted

## 2016-08-11 NOTE — Patient Outreach (Signed)
[facility]) Care Management   08/11/2016  Bridget [name] 1960/11/13 [id number]  Bridget [name] is an 55 y.o. female who presents to the [place] Management office for routine Link To Wellness follow up for self management assistance with Type II DM, HTN, hyperlipidemia and obesity.  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 She says she often forgets to take her oral scheduled medications but does not forgot her insulin. She says she is seeing an improvement in her blood sugars since starting the Tresiba on 07/11/16 when she saw Dr. Loanne Drilling.   She also admits she is not checking her blood sugar everyday as directed and is not consistently following a CHO controlled meal plan.  She says she hopes the [facility] app will help improve these behaviors.  She says Dr. Loanne Drilling stopped her glimepiride on 05/11/16 due to ineffectiveness from chronic use.  She says she is still planning to have metabolic surgery in mid to late 2018.   Objective:   Review of Systems  Constitutional: Negative.     Physical Exam  Constitutional: She is oriented to person, place, and time. She appears well-developed and well-nourished.  Respiratory: Effort normal.  Neurological: She is alert and oriented to person, place, and time.  Skin: Skin is warm and dry.  Psychiatric: She has a normal mood and affect. Her behavior is normal. Judgment and thought content normal.    Filed Weights   08/11/16 0754  Weight: 248 lb (112.5 kg)   Vitals:   08/11/16 0754  BP: 120/80   Encounter Medications:   Outpatient Encounter Prescriptions as of 08/11/2016  Medication Sig Note  . Alcohol Swabs (ALCOHOL PREPS) PADS 1 Device by Does not apply route 4 (four) times daily -  before meals and at bedtime.   Marland Kitchen amitriptyline (ELAVIL) 25 MG tablet Take 0.5 tablets (12.5 mg total) by mouth at bedtime.   Marland Kitchen  aspirin 81 MG tablet Take 1 tablet (81 mg total) by mouth daily.   . bromocriptine (PARLODEL) 2.5 MG tablet Take 0.5 tablets (1.25 mg total) by mouth daily.   . cetirizine (ZYRTEC) 10 MG tablet Take 1 tablet (10 mg total) by mouth daily. 05/19/2016: Takes prn  . colesevelam (WELCHOL) 625 MG tablet Take 2 tablets (1,250 mg total) by mouth 2 (two) times daily with a meal.   . Cranberry-Vitamin C-Probiotic (AZO CRANBERRY PO) Take by mouth daily. Reported on 10/13/2015 03/08/2016: Takes prn  . cyclobenzaprine (FLEXERIL) 5 MG tablet TAKE 1 TABLET BY MOUTH 3 TIMES DAILY AS NEEDED FOR MUSCLE SPASMS.   Marland Kitchen docusate sodium (COLACE) 100 MG capsule Take 1 capsule (100 mg total) by mouth 3 (three) times daily as needed.   Marland Kitchen glucose blood (TRUE METRIX BLOOD GLUCOSE TEST) test strip 1 each by Other route 2 (two) times daily. Use to check blood sugars twice a day Dx E11.9   . insulin degludec (TRESIBA FLEXTOUCH) 100 UNIT/ML SOPN FlexTouch Pen Inject 0.5 mLs (50 Units total) into the skin daily. And pen needles 1/day   . lisinopril (PRINIVIL,ZESTRIL) 5 MG tablet TAKE 1 TABLET BY MOUTH DAILY.   Marland Kitchen LYRICA 150 MG capsule TAKE 1 CAPSULE BY MOUTH TWICE DAILY   . OVER THE COUNTER MEDICATION 1 capsule daily.  03/08/2016: Curamin is curcumin and boswella and DLPA and nattokinase- she takes it for chronic  pain and inflammation  . pantoprazole (PROTONIX) 20 MG tablet TAKE 1 TABLET  BY MOUTH DAILY.   Marland Kitchen triamcinolone (NASACORT AQ) 55 MCG/ACT AERO nasal inhaler Place 2 sprays into the nose daily.   . TRUEPLUS LANCETS 28G MISC Use to help check blood sugars twice a day Dx E11.9   . TRUETEST TEST test strip USE AS INSTRUCTED 03/08/2016: Using True Metrix  . Blood Glucose Monitoring Suppl (TRUE METRIX METER) w/Device KIT Use to check blood sugars twice a day Dx E11.9 (Patient not taking: Reported on 08/11/2016)   . NALTREXONE HCL PO Take 3 mg by mouth daily. 05/19/2016: Did not tolerate due to headaches  . NONFORMULARY OR COMPOUNDED  ITEM Take 1 tablet by mouth at bedtime. Reported on 03/08/2016 05/19/2016: Did not tolerate it due to headaches  . ondansetron (ZOFRAN ODT) 8 MG disintegrating tablet Take 1 tablet (8 mg total) by mouth every 8 (eight) hours as needed for nausea or vomiting. (Patient not taking: Reported on 08/11/2016)    No facility-administered encounter medications on file as of 08/11/2016.     Functional Status:   In your present state of health, do you have any difficulty performing the following activities: 03/08/2016 02/08/2016  Hearing? N N  Vision? N Y  Difficulty concentrating or making decisions? N N  Walking or climbing stairs? Y Y  Dressing or bathing? N N  Doing errands, shopping? N N  Some recent data might be hidden    Fall/Depression Screening:    PHQ 2/9 Scores 02/08/2016 01/12/2016 12/15/2014 09/14/2014 09/27/2013 08/07/2013 12/08/2011  PHQ - 2 Score 0 0 1 0 0 0 6  PHQ- 9 Score - - - - - - 6  Exception Documentation Patient refusal - - - - - -    Assessment:   [employer] employee and Link To Wellness member with Type II DM, HTN, hyperlipidemia  (on no medications)  and obesity.  Plan:   [facility] CM Care Plan Problem One   Flowsheet Row Most Recent Value  Care Plan Problem One Link To Wellness member with Type II DM, HTN hyperlipidemia and morbid obesity,   not meeting Hgb A1C target of <7.0% as evidenced by POC Hgb A1C= 9.9% on 07/11/16, meeting treatment targets for HTN,  hyperlipidemia with worsening lipid control as evidenced by increased total cholesterol (202) , triglycerides (227) and LDL (119) on lipid profile of 09/09/15- not on statin, 5 lb weight gain in the last month  Role Documenting the Problem One  Care Management [place] for Problem One  Active  THN Long Term Goal (31-90 days) Improved glycemic control as evidenced by Hgb A1C<9.0% at next check, evidenced of recording daily CBG self monitoring at varying times per MD instructions,  improved lipid control as  evidenced by normal lipid panel at next check, ongoing good control of HTN as evidenced by BP readings <130/<80 and weight loss or no weight gain at next assessment   THN Long Term Goal Start Date 08/11/16  [facility] Long Term Goal Met Date    Interventions for Problem One Long Term Goal reviewed Dr. Cordelia Pen instructions given to her on 11/13 after the office visit regarding daily CBG checks at various times, recording them and bringing them with her meter to her follow up appointments with him,  reviewed blood sugar log and glucose meter history and discussed probable reasons for elevated blood sugars and strategies to improve glucose control,  reviewed upcoming appointments with Dr. Loanne Drilling on 09/09/16, ensured Bridget [name] is enrolled in the [place] program, will arrange [employer] for January and  advised her disease self management follow up will occur via [place] in place of the Link To Wellness program    RNCM to fax today's office visit note to Dr. Sharlet Salina and Dr. Loanne Drilling. Barrington Ellison RN,CCM,CDE [place] Management Coordinator Link To Wellness Office Phone [phone] Office Fax [phone]

## 2016-08-12 ENCOUNTER — Encounter: Payer: Self-pay | Admitting: *Deleted

## 2016-08-24 MED FILL — TRUE METRIX GLUCOSE TEST ST: 50 days supply | Qty: 100 | Fill #1

## 2016-09-04 NOTE — Progress Notes (Signed)
Subjective:    Patient ID: Bridget [name], female    DOB: 09-02-1960, 56 y.o.   MRN: [id number]  HPI Pt returns for f/u of diabetes mellitus: DM type: Insulin-requiring type 2 Dx'ed: 4196 Complications: polyneuropathy.   Therapy: insulin + 2 oral meds GDM: never DKA: never Severe hypoglycemia: never Pancreatitis: never.  Other: she did not tolerate invokana (vaginitis) or metformin-XR (diarrhea); edema precludes pioglitizone rx.  she took insulin from 2000-2003; in 2017, she resumed insulin.  She declines multiple daily injections; she works 3rd shift.  Interval history: Pt says she never misses the insulin.  no cbg record, but states cbg's varies from 100-253.  She has pain at the feet (worse with walking), and tingling.   Past Medical History:  Diagnosis Date  . Bacterial vaginosis 12/07/2010  . Depression   . Diabetes mellitus without complication ([place])   . Diverticulosis   . DIVERTICULOSIS, COLON 10/26/2006  . Fibromyalgia   . GERD (gastroesophageal reflux disease)   . Glaucoma   . Headache   . HEEL SPUR 09/10/2007   Qualifier: Diagnosis of  By: Drue Flirt  MD, Merrily Brittle    . Hypertension   . MALAISE AND FATIGUE 05/27/2010  . Obesity   . OBESITY, NOS 10/26/2006  . Osteoarthritis   . OSTEOARTHRITIS, KNEE 04/27/2010  . Retained tampon 05/21/2012  . SHOULDER PAIN, LEFT 05/19/2010   Qualifier: Diagnosis of  By: Annamary Carolin MD, Amber    . Vaginal yeast infection 12/08/2010    Past Surgical History:  Procedure Laterality Date  . ABDOMINAL HYSTERECTOMY  1990  . ABDOMINAL HYSTERECTOMY    . BLADDER SURGERY  2001  . CATARACT EXTRACTION Right 2001   with implant  . EYE SURGERY     implant rt eye  . ROTATOR CUFF REPAIR Right   . SHOULDER ARTHROSCOPY WITH SUBACROMIAL DECOMPRESSION Left 07/20/2015   Procedure: SHOULDER ARTHROSCOPY WITH DEBRIDEMENT OF ROTATOR CUFF AND SUBACROMIAL DECOMPRESSION ;  Surgeon: Tania Ade, MD;  Location: [facility];  Service: Orthopedics;   Laterality: Left;  Left shoulder arthroscopy with debridement of rotator cuff and subacromial decompression    Social History   Social History  . Marital status: Divorced    Spouse name: N/A  . Number of children: 2  . Years of education: N/A   Occupational History  . PBX operator [employer]   Social History Main Topics  . Smoking status: Former Smoker    Packs/day: 0.30    Types: Cigarettes    Quit date: 08/29/1998  . Smokeless tobacco: Never Used     Comment: Quit in 2000   . Alcohol use Yes     Comment: 1 per week  . Drug use: No  . Sexual activity: Yes    Birth control/ protection: Surgical   Other Topics Concern  . Not on file   Social History Narrative   Works at [employer] as a Museum/gallery conservator    Current Outpatient Prescriptions on File Prior to Visit  Medication Sig Dispense Refill  . Alcohol Swabs (ALCOHOL PREPS) PADS 1 Device by Does not apply route 4 (four) times daily -  before meals and at bedtime. 100 each 11  . amitriptyline (ELAVIL) 25 MG tablet Take 0.5 tablets (12.5 mg total) by mouth at bedtime. 30 tablet 2  . aspirin 81 MG tablet Take 1 tablet (81 mg total) by mouth daily.    . Blood Glucose Monitoring Suppl (TRUE METRIX METER) w/Device KIT Use to check blood sugars twice a  day Dx E11.9 1 kit 0  . bromocriptine (PARLODEL) 2.5 MG tablet Take 0.5 tablets (1.25 mg total) by mouth daily. 15 tablet 11  . cetirizine (ZYRTEC) 10 MG tablet Take 1 tablet (10 mg total) by mouth daily. 30 tablet 11  . colesevelam (WELCHOL) 625 MG tablet Take 2 tablets (1,250 mg total) by mouth 2 (two) times daily with a meal. 60 tablet 11  . Cranberry-Vitamin C-Probiotic (AZO CRANBERRY PO) Take by mouth daily. Reported on 10/13/2015    . cyclobenzaprine (FLEXERIL) 5 MG tablet TAKE 1 TABLET BY MOUTH 3 TIMES DAILY AS NEEDED FOR MUSCLE SPASMS. 90 tablet 1  . docusate sodium (COLACE) 100 MG capsule Take 1 capsule (100 mg total) by mouth 3 (three) times daily as needed. 20 capsule 0  .  glucose blood (TRUE METRIX BLOOD GLUCOSE TEST) test strip 1 each by Other route 2 (two) times daily. Use to check blood sugars twice a day Dx E11.9 100 each 3  . lisinopril (PRINIVIL,ZESTRIL) 5 MG tablet TAKE 1 TABLET BY MOUTH DAILY. 30 tablet 5  . LYRICA 150 MG capsule TAKE 1 CAPSULE BY MOUTH TWICE DAILY 60 capsule 3  . NALTREXONE HCL PO Take 3 mg by mouth daily.    . NONFORMULARY OR COMPOUNDED ITEM Take 1 tablet by mouth at bedtime. Reported on 03/08/2016    . ondansetron (ZOFRAN ODT) 8 MG disintegrating tablet Take 1 tablet (8 mg total) by mouth every 8 (eight) hours as needed for nausea or vomiting. 20 tablet 0  . OVER THE COUNTER MEDICATION 1 capsule daily.     . pantoprazole (PROTONIX) 20 MG tablet TAKE 1 TABLET BY MOUTH DAILY. 30 tablet PRN  . triamcinolone (NASACORT AQ) 55 MCG/ACT AERO nasal inhaler Place 2 sprays into the nose daily. 1 Inhaler 12  . TRUEPLUS LANCETS 28G MISC Use to help check blood sugars twice a day Dx E11.9 100 each 3  . TRUETEST TEST test strip USE AS INSTRUCTED 100 each 11   No current facility-administered medications on file prior to visit.     Allergies  Allergen Reactions  . Citrus Itching  . Invokana [Canagliflozin] Other (See Comments)    Caused frequent vaginal yeast infections  . Codeine Hives  . Metformin And Related Diarrhea and Nausea And Vomiting  . Metoclopramide Hcl Nausea And Vomiting  . Penicillins Nausea And Vomiting  . Propoxyphene N-Acetaminophen Nausea And Vomiting  . Sulfonamide Derivatives Hives    Family History  Problem Relation Age of Onset  . Breast cancer Mother   . Diabetes Mother   . Ovarian cancer Maternal Grandmother   . Heart disease Maternal Grandmother     great  . Stomach cancer Cousin   . Diabetes Brother     x 3    BP 132/80   Pulse 80   Ht '5\' 5"'  (1.651 m)   Wt 248 lb (112.5 kg)   SpO2 96%   BMI 41.27 kg/m    Review of Systems She denies hypoglycemia.      Objective:   Physical Exam VITAL SIGNS:   See vs page GENERAL: no distress Pulses: dorsalis pedis intact bilat.   MSK: no deformity of the feet CV: trace bilat leg edema Skin:  no ulcer on the feet.  normal color and temp on the feet. Neuro: sensation is intact to touch on the feet.   A1c=9.1    Assessment & Plan:  Insulin-requiring type 2 DM: she needs increased rx Paresthesia, new: check B-12. Noncompliance with  insulin: improved.  Patient is advised the following: Patient Instructions  Please increase the Tresiba to 60 units each morning, and:  continue the same diabetes pills.  On this type of insulin schedule, you should eat meals on a regular schedule.  If a meal is missed or significantly delayed, your blood sugar could go low.   Please see a foot specialist.  you will receive a phone call, about a day and time for an appointment check your blood sugar once a day.  vary the time of day when you check, between before the 3 meals, and at bedtime.  also check if you have symptoms of your blood sugar being too high or too low.  please keep a record of the readings and bring it to your next appointment here (or you can bring the meter itself).  You can write it on any piece of paper.  please call us sooner if your blood sugar goes below 70, or if you have a lot of readings over 200.  With the [place], if you miss the insulin, you can still take the full amount 12 hours later.   Please come back for a follow-up appointment in 2 months.

## 2016-09-07 MED FILL — TRESIBA FLEXTOUCH 100 UNITS: 100 | 30 days supply | Qty: 15 | Fill #2

## 2016-09-07 MED FILL — BROMOCRIPTINE 2.5 MG TABLET: 2.5 | 30 days supply | Qty: 15 | Fill #5

## 2016-09-09 ENCOUNTER — Ambulatory Visit (INDEPENDENT_AMBULATORY_CARE_PROVIDER_SITE_OTHER): Payer: 59 | Admitting: Endocrinology

## 2016-09-09 ENCOUNTER — Encounter: Payer: Self-pay | Admitting: Endocrinology

## 2016-09-09 VITALS — BP 132/80 | HR 80 | Ht 65.0 in | Wt 248.0 lb

## 2016-09-09 DIAGNOSIS — E118 Type 2 diabetes mellitus with unspecified complications: Secondary | ICD-10-CM

## 2016-09-09 DIAGNOSIS — M5410 Radiculopathy, site unspecified: Secondary | ICD-10-CM | POA: Insufficient documentation

## 2016-09-09 DIAGNOSIS — Z794 Long term (current) use of insulin: Secondary | ICD-10-CM

## 2016-09-09 DIAGNOSIS — E1165 Type 2 diabetes mellitus with hyperglycemia: Secondary | ICD-10-CM | POA: Diagnosis not present

## 2016-09-09 DIAGNOSIS — R202 Paresthesia of skin: Secondary | ICD-10-CM | POA: Diagnosis not present

## 2016-09-09 DIAGNOSIS — M79672 Pain in left foot: Secondary | ICD-10-CM

## 2016-09-09 DIAGNOSIS — IMO0002 Reserved for concepts with insufficient information to code with codable children: Secondary | ICD-10-CM

## 2016-09-09 DIAGNOSIS — M79671 Pain in right foot: Secondary | ICD-10-CM | POA: Diagnosis not present

## 2016-09-09 LAB — VITAMIN B12: VITAMIN B 12: 328 pg/mL (ref 211–911)

## 2016-09-09 LAB — POCT GLYCOSYLATED HEMOGLOBIN (HGB A1C): Hemoglobin A1C: 9.1

## 2016-09-09 MED ORDER — INSULIN DEGLUDEC 100 UNIT/ML ~~LOC~~ SOPN: 60.0000 [IU] | PEN_INJECTOR | Freq: Every day | SUBCUTANEOUS | 11 refills | Status: DC

## 2016-09-09 NOTE — Patient Instructions (Addendum)
Please increase the Tresiba to 60 units each morning, and:  continue the same diabetes pills.  On this type of insulin schedule, you should eat meals on a regular schedule.  If a meal is missed or significantly delayed, your blood sugar could go low.   Please see a foot specialist.  you will receive a phone call, about a day and time for an appointment check your blood sugar once a day.  vary the time of day when you check, between before the 3 meals, and at bedtime.  also check if you have symptoms of your blood sugar being too high or too low.  please keep a record of the readings and bring it to your next appointment here (or you can bring the meter itself).  You can write it on any piece of paper.  please call us sooner if your blood sugar goes below 70, or if you have a lot of readings over 200.  With the [place], if you miss the insulin, you can still take the full amount 12 hours later.   Please come back for a follow-up appointment in 2 months.

## 2016-09-13 ENCOUNTER — Encounter: Payer: Self-pay | Admitting: Internal Medicine

## 2016-09-13 ENCOUNTER — Ambulatory Visit (INDEPENDENT_AMBULATORY_CARE_PROVIDER_SITE_OTHER): Payer: 59 | Admitting: Internal Medicine

## 2016-09-13 DIAGNOSIS — E1165 Type 2 diabetes mellitus with hyperglycemia: Secondary | ICD-10-CM

## 2016-09-13 DIAGNOSIS — E118 Type 2 diabetes mellitus with unspecified complications: Secondary | ICD-10-CM | POA: Diagnosis not present

## 2016-09-13 DIAGNOSIS — R52 Pain, unspecified: Secondary | ICD-10-CM

## 2016-09-13 DIAGNOSIS — Z794 Long term (current) use of insulin: Secondary | ICD-10-CM

## 2016-09-13 DIAGNOSIS — G8929 Other chronic pain: Secondary | ICD-10-CM | POA: Diagnosis not present

## 2016-09-13 DIAGNOSIS — IMO0002 Reserved for concepts with insufficient information to code with codable children: Secondary | ICD-10-CM

## 2016-09-13 NOTE — Progress Notes (Signed)
   Subjective:    Patient ID: Bridget [name], female    DOB: 03-01-1961, 56 y.o.   MRN: [id number]  HPI The patient is a 56 YO female [facility]. She denies change to her health since last visit. She is still having severe pain in her feet. Lyrica does help but she is not able to take it before work due to lack of concentration with taking.  She is still working her same job and is still not happy there. She is frustrated that she is not able to take her fmla time off as she does not have pal time and is not paid during FMLA time. She has bills and cannot take time off unpaid but feels her health is better when she is able to take the 9 days off per month.   Review of Systems  Constitutional: Positive for activity change. Negative for appetite change, chills, fatigue, fever and unexpected weight change.  HENT: Negative.   Eyes: Negative.   Respiratory: Negative.   Cardiovascular: Negative.   Gastrointestinal: Negative.   Endocrine: Negative.   Musculoskeletal: Positive for arthralgias, gait problem, joint swelling and myalgias. Negative for neck pain and neck stiffness.  Skin: Negative.   Neurological: Positive for numbness. Negative for dizziness, tremors, seizures, syncope, speech difficulty, weakness, light-headedness and headaches.  Psychiatric/Behavioral: Positive for decreased concentration and dysphoric mood. Negative for self-injury, sleep disturbance and suicidal ideas. The patient is not nervous/anxious.       Objective:   Physical Exam  Constitutional: She is oriented to person, place, and time. She appears well-developed and well-nourished.  Overweight  HENT:  Head: Normocephalic and atraumatic.  Eyes: EOM are normal.  Neck: Normal range of motion.  Cardiovascular: Normal rate and regular rhythm.   Pulmonary/Chest: Effort normal. No respiratory distress. She has no wheezes. She has no rales.  Abdominal: Soft. She exhibits no distension. There  is no tenderness. There is no rebound.  Musculoskeletal: She exhibits tenderness.  Neurological: She is alert and oriented to person, place, and time. Coordination normal.  Skin: Skin is warm and dry.   Vitals:   09/13/16 1306  BP: 132/80  Pulse: 93  Resp: 16  Temp: 98.3 F (36.8 C)  TempSrc: Oral  SpO2: 98%  Weight: 234 lb (106.1 kg)  Height: 5\' 5"  (1.651 m)      Assessment & Plan:  Visit time 25 minutes: greater than 50% of that time was spent with the patient in counseling and coordination of care: counseling about the nature of FMLA and how she may be better off to find a job she can do all the time instead of having to balance her health and money. Also counseling about her poorly controlled diabetes which is worsening her overall health situation.

## 2016-09-13 NOTE — Progress Notes (Signed)
Pre visit review using our clinic review tool, if applicable. No additional management support is needed unless otherwise documented below in the visit note. 

## 2016-09-13 NOTE — Assessment & Plan Note (Addendum)
Filled out FMLA forms today and faxed in for same 9 days per month. She is not taking every month due to financial concerns. Explained to her that FMLA is not paid time off and it is used for only medical emergencies so that people have job security for health problems and that it is not meant to ensure finances while not working. Talked to her about changing jobs again and disability and she is thinking about it. She is also thinking about weight loss surgery to help with her diabetes which is driving her poor health.

## 2016-09-13 NOTE — Patient Instructions (Signed)
We have filled out the FMLA forms today for the work.

## 2016-09-16 ENCOUNTER — Other Ambulatory Visit: Payer: Self-pay | Admitting: Internal Medicine

## 2016-09-16 NOTE — Assessment & Plan Note (Signed)
She is seeing endocrinology and they have asked her multiple times to consider multiple insulin injections per day which she is not willing to do. She does state that she is taking her medications as prescribed at this time with missing some doses due to her 3rd shift job and sleep schedule.

## 2016-09-21 MED FILL — UNIFINE PENTIPS 31GX3/16": 31G X 5 MM | 90 days supply | Qty: 100 | Fill #1

## 2016-09-21 MED FILL — UNIFINE PENTIPS 31GX3/16: 31G X 5 MM | 90 days supply | Qty: 100 | Fill #1

## 2016-09-26 ENCOUNTER — Other Ambulatory Visit: Payer: Self-pay | Admitting: Internal Medicine

## 2016-09-26 MED FILL — LANTUS SOLOSTAR 100 UNITS/M: 100 | 30 days supply | Qty: 9 | Fill #2

## 2016-09-28 MED FILL — LISINOPRIL 5 MG TABLET: 5 | 90 days supply | Qty: 90 | Fill #1

## 2016-10-07 ENCOUNTER — Telehealth: Payer: Self-pay | Admitting: *Deleted

## 2016-10-07 ENCOUNTER — Ambulatory Visit (INDEPENDENT_AMBULATORY_CARE_PROVIDER_SITE_OTHER): Payer: 59 | Admitting: Podiatry

## 2016-10-07 ENCOUNTER — Encounter: Payer: Self-pay | Admitting: Podiatry

## 2016-10-07 ENCOUNTER — Ambulatory Visit (INDEPENDENT_AMBULATORY_CARE_PROVIDER_SITE_OTHER): Payer: 59

## 2016-10-07 VITALS — BP 131/73 | HR 81 | Resp 18

## 2016-10-07 DIAGNOSIS — M7741 Metatarsalgia, right foot: Secondary | ICD-10-CM | POA: Diagnosis not present

## 2016-10-07 DIAGNOSIS — M779 Enthesopathy, unspecified: Secondary | ICD-10-CM

## 2016-10-07 DIAGNOSIS — M7742 Metatarsalgia, left foot: Secondary | ICD-10-CM

## 2016-10-07 DIAGNOSIS — M722 Plantar fascial fibromatosis: Secondary | ICD-10-CM | POA: Diagnosis not present

## 2016-10-07 MED ORDER — NONFORMULARY OR COMPOUNDED ITEM: 0 refills | Status: DC

## 2016-10-07 NOTE — Telephone Encounter (Signed)
Called patient and left a message stating that the insurance pays at 80% and leaves patient 20% to pay and stated to call back to let us know if she wants them. Misty StanleyLisa

## 2016-10-07 NOTE — Progress Notes (Signed)
   Subjective:    Patient ID: Bridget [name], female    DOB: [birth year], 56 y.o.   MRN: [id number]  HPI  56 year old female presents the office they for concerns of pain to both of her feet. She states that her feet hurt all over assess after working and standing all day. She points to the ball of the foot which is the majority pain on both sides that she also points the heel as well as the arch of the foot. This been ongoing for several months. She denies any recent injury or trauma. The pain does not wake her up at night. Her last A1c was 9.1. She said no recent treatment for this. No other complaints at this time.  Review of Systems  All other systems reviewed and are negative.      Objective:   Physical Exam General: AAO x3, NAD  Dermatological: Skin is warm, dry and supple bilateral. Nails x 10 are well manicured; remaining integument appears unremarkable at this time. There are no open sores, no preulcerative lesions, no rash or signs of infection present.  Vascular: Dorsalis Pedis artery and Posterior Tibial artery pedal pulses are 2/4 bilateral with immedate capillary fill time.  There is no pain with calf compression, swelling, warmth, erythema.   Neruologic: Grossly intact via light touch bilateral. Vibratory intact via tuning fork bilateral. Protective threshold with Semmes Wienstein monofilament intact to all pedal sites bilateral.   Musculoskeletal: There is mild diffuse tenderness bilateral submetatarsal 2 through 5. There is no pain to the dorsal aspect of the metatarsals. Along the arch of the foot she also has some subjective discomfort however she is otherwise. Pain today. Minimal tenderness palpation along the insertion of plantar fascial bilaterally on the plantar medial tubercle of the calcaneus. There is no pain with lateral compression of the calcaneus. Ankle, subtalar range of motion intact. There is no other areas of tenderness bilaterally. Muscular strength 5/5 in all  groups tested bilateral.  Gait: Unassisted, Nonantalgic.      Assessment & Plan:  56 year old female bilateral metatarsalgia, plantar fasciitis -Treatment options discussed including all alternatives, risks, and complications -Etiology of symptoms were discussed -X-rays were obtained and reviewed with the patient. No evidence of acute fracture identified. -Discussed steroid injection however given recent increased blood sugar we will hold off on this. -Dispensed metatarsal offloading pads. -She would likely benefit from orthotics. She wishes to proceed. [facility] was [facility] -Stretching and ice daily -Discussed shoe changes -RTC in 3 weeks to [facility] or sooner if needed  Ovid CurdMatthew Wagoner, DPM

## 2016-10-10 ENCOUNTER — Other Ambulatory Visit: Payer: Self-pay | Admitting: Internal Medicine

## 2016-10-10 DIAGNOSIS — M722 Plantar fascial fibromatosis: Secondary | ICD-10-CM | POA: Insufficient documentation

## 2016-10-10 MED FILL — PANTOPRAZOLE SOD DR 20 MG T: 20 | 90 days supply | Qty: 90 | Fill #0

## 2016-10-10 MED FILL — BROMOCRIPTINE 2.5 MG TABLET: 2.5 | 30 days supply | Qty: 15 | Fill #6

## 2016-10-10 MED FILL — TRESIBA FLEXTOUCH 100 UNITS: 100 | 30 days supply | Qty: 15 | Fill #3

## 2016-10-12 MED FILL — SM ALCOHOL 70% PREP PADS: 25 days supply | Qty: 100 | Fill #0

## 2016-10-25 ENCOUNTER — Other Ambulatory Visit: Payer: Self-pay

## 2016-10-25 MED ORDER — ACCU-CHEK GUIDE W/DEVICE KIT: 2.0000 | PACK | Freq: Every day | 2 refills | Status: DC

## 2016-10-25 MED ORDER — ACCU-CHEK MULTICLIX LANCETS MISC: 2 refills | Status: DC

## 2016-10-25 MED ORDER — GLUCOSE BLOOD VI STRP: ORAL_STRIP | 2 refills | Status: DC

## 2016-10-25 MED FILL — ACCU-CHEK GUIDE TEST STRIP: 90 days supply | Qty: 200 | Fill #0

## 2016-10-25 MED FILL — ACCU-CHEK FASTCLIX LANCETS: 88 days supply | Qty: 204 | Fill #0

## 2016-10-26 MED FILL — WELCHOL 625 MG TABLET: 625 | 30 days supply | Qty: 120 | Fill #1

## 2016-10-28 ENCOUNTER — Ambulatory Visit (INDEPENDENT_AMBULATORY_CARE_PROVIDER_SITE_OTHER): Payer: Self-pay | Admitting: Podiatry

## 2016-10-28 ENCOUNTER — Encounter: Payer: Self-pay | Admitting: Podiatry

## 2016-10-28 VITALS — BP 130/77 | HR 70 | Resp 18

## 2016-10-28 DIAGNOSIS — M722 Plantar fascial fibromatosis: Secondary | ICD-10-CM

## 2016-10-28 MED ORDER — DICLOFENAC SODIUM 1 % TD GEL: 2.0000 g | Freq: Four times a day (QID) | TRANSDERMAL | 2 refills | Status: DC

## 2016-10-28 MED FILL — DICLOFENAC SODIUM 1% GEL: 1 | 12 days supply | Qty: 100 | Fill #0

## 2016-10-28 NOTE — Patient Instructions (Signed)

## 2016-10-28 NOTE — Progress Notes (Signed)
Patient presents to [facility]. She declined an appointment by myself and was seen today by Hadley PenLisa Cox, CMA. She states that the [facility] cream was too expensive and is asking for an alternative. Sent Voltaren gel to her pharmacy to see if this will be covered but told her it may need a prior authorization. Follow-up in 4 weeks or sooner if needed.

## 2016-11-07 ENCOUNTER — Ambulatory Visit (INDEPENDENT_AMBULATORY_CARE_PROVIDER_SITE_OTHER): Payer: 59 | Admitting: Endocrinology

## 2016-11-07 ENCOUNTER — Encounter: Payer: Self-pay | Admitting: Endocrinology

## 2016-11-07 VITALS — BP 132/82 | HR 80 | Wt 252.0 lb

## 2016-11-07 DIAGNOSIS — E1165 Type 2 diabetes mellitus with hyperglycemia: Secondary | ICD-10-CM | POA: Diagnosis not present

## 2016-11-07 DIAGNOSIS — IMO0002 Reserved for concepts with insufficient information to code with codable children: Secondary | ICD-10-CM

## 2016-11-07 DIAGNOSIS — E118 Type 2 diabetes mellitus with unspecified complications: Secondary | ICD-10-CM

## 2016-11-07 DIAGNOSIS — Z794 Long term (current) use of insulin: Secondary | ICD-10-CM | POA: Diagnosis not present

## 2016-11-07 LAB — POCT GLYCOSYLATED HEMOGLOBIN (HGB A1C): HEMOGLOBIN A1C: 8.4

## 2016-11-07 MED ORDER — INSULIN DEGLUDEC 100 UNIT/ML ~~LOC~~ SOPN: 70.0000 [IU] | PEN_INJECTOR | Freq: Every day | SUBCUTANEOUS | 11 refills | Status: DC

## 2016-11-07 MED FILL — TRESIBA FLEXTOUCH 100 UNITS: 100 | 21 days supply | Qty: 15 | Fill #0

## 2016-11-07 NOTE — Progress Notes (Signed)
Subjective:    Patient ID: Bridget [name], female    DOB: 1961/02/23, 56 y.o.   MRN: [id number]  HPI Pt returns for f/u of diabetes mellitus: DM type: Insulin-requiring type 2 Dx'ed: 8768 Complications: polyneuropathy.   Therapy: insulin + 2 oral meds.   GDM: never.  DKA: never Severe hypoglycemia: never.  Pancreatitis: never.  Other: she did not tolerate invokana (vaginitis) or metformin-XR (diarrhea); edema precludes pioglitizone rx.  she took insulin from 2000-2003; in 2017, she resumed insulin.  She declines multiple daily injections; she works 3rd shift.  Interval history: Pt says she misses the insulin approx once per week.  no cbg record, but states cbg's are in the mid-100's. pt states she feels well in general.  Past Medical History:  Diagnosis Date  . Bacterial vaginosis 12/07/2010  . Depression   . Diabetes mellitus without complication ([place])   . Diverticulosis   . DIVERTICULOSIS, COLON 10/26/2006  . Fibromyalgia   . GERD (gastroesophageal reflux disease)   . Glaucoma   . Headache   . HEEL SPUR 09/10/2007   Qualifier: Diagnosis of  By: Drue Flirt  MD, Merrily Brittle    . Hypertension   . MALAISE AND FATIGUE 05/27/2010  . Obesity   . OBESITY, NOS 10/26/2006  . Osteoarthritis   . OSTEOARTHRITIS, KNEE 04/27/2010  . Retained tampon 05/21/2012  . SHOULDER PAIN, LEFT 05/19/2010   Qualifier: Diagnosis of  By: Annamary Carolin MD, Amber    . Vaginal yeast infection 12/08/2010    Past Surgical History:  Procedure Laterality Date  . ABDOMINAL HYSTERECTOMY  1990  . ABDOMINAL HYSTERECTOMY    . BLADDER SURGERY  2001  . CATARACT EXTRACTION Right 2001   with implant  . EYE SURGERY     implant rt eye  . ROTATOR CUFF REPAIR Right   . SHOULDER ARTHROSCOPY WITH SUBACROMIAL DECOMPRESSION Left 07/20/2015   Procedure: SHOULDER ARTHROSCOPY WITH DEBRIDEMENT OF ROTATOR CUFF AND SUBACROMIAL DECOMPRESSION ;  Surgeon: Tania Ade, MD;  Location: [facility];  Service: Orthopedics;   Laterality: Left;  Left shoulder arthroscopy with debridement of rotator cuff and subacromial decompression    Social History   Social History  . Marital status: Divorced    Spouse name: N/A  . Number of children: 2  . Years of education: N/A   Occupational History  . 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 Smoking status: Former Smoker    Packs/day: 0.30    Types: Cigarettes    Quit date: 08/29/1998  . Smokeless tobacco: Never Used     Comment: Quit in 2000   . Alcohol use Yes     Comment: 1 per week  . Drug use: No  . Sexual activity: Yes    Birth control/ protection: Surgical   Other Topics Concern  . Not on file   Social History Narrative   Works at [employer] as a Museum/gallery conservator    Current Outpatient Prescriptions on File Prior to Visit  Medication Sig Dispense Refill  . Alcohol Swabs (ALCOHOL PREPS) PADS 1 Device by Does not apply route 4 (four) times daily -  before meals and at bedtime. 100 each 11  . amitriptyline (ELAVIL) 25 MG tablet Take 0.5 tablets (12.5 mg total) by mouth at bedtime. 30 tablet 2  . aspirin 81 MG tablet Take 1 tablet (81 mg total) by mouth daily.    . Blood Glucose Monitoring Suppl (ACCU-CHEK GUIDE) w/Device KIT 2 each by Does not apply  route daily. 1 kit 2  . bromocriptine (PARLODEL) 2.5 MG tablet Take 0.5 tablets (1.25 mg total) by mouth daily. 15 tablet 11  . cetirizine (ZYRTEC) 10 MG tablet Take 1 tablet (10 mg total) by mouth daily. 30 tablet 11  . colesevelam (WELCHOL) 625 MG tablet Take 2 tablets (1,250 mg total) by mouth 2 (two) times daily with a meal. 60 tablet 11  . Cranberry-Vitamin C-Probiotic (AZO CRANBERRY PO) Take by mouth daily. Reported on 10/13/2015    . cyclobenzaprine (FLEXERIL) 5 MG tablet TAKE 1 TABLET BY MOUTH 3 TIMES DAILY AS NEEDED FOR MUSCLE SPASMS. 90 tablet 1  . diclofenac sodium (VOLTAREN) 1 % GEL Apply 2 g topically 4 (four) times daily. Rub into affected area of foot 2 to 4 times daily 100 g 2  .  docusate sodium (COLACE) 100 MG capsule Take 1 capsule (100 mg total) by mouth 3 (three) times daily as needed. 20 capsule 0  . glucose blood (ACCU-CHEK GUIDE) test strip Use to check blood sugar 2 times per day. Dx code: E11.9 200 each 2  . Lancets (ACCU-CHEK MULTICLIX) lancets Use to check blood sugar 2 times per day. Dx code E11.9 200 each 2  . lisinopril (PRINIVIL,ZESTRIL) 5 MG tablet TAKE 1 TABLET BY MOUTH DAILY. 30 tablet 5  . LYRICA 150 MG capsule TAKE 1 CAPSULE BY MOUTH TWICE DAILY 60 capsule 3  . LYRICA 25 MG capsule TAKE 1 CAPSULE BY MOUTH TWICE DAILY 60 capsule 0  . NALTREXONE HCL PO Take 3 mg by mouth daily.    . NONFORMULARY OR COMPOUNDED ITEM Take 1 tablet by mouth at bedtime. Reported on 03/08/2016    . NONFORMULARY OR COMPOUNDED ITEM [facility]: [place] Tendonitis Cream - Diclofenac 3%, Baclofen 2%, Bupivacaine 1%, Gabapentin 6%, Ibuprofen 3%, Pentoxifylline 3%, apply 1-2 gram to feet 3-4 times daily. 120 each 0  . ondansetron (ZOFRAN ODT) 8 MG disintegrating tablet Take 1 tablet (8 mg total) by mouth every 8 (eight) hours as needed for nausea or vomiting. 20 tablet 0  . OVER THE COUNTER MEDICATION 1 capsule daily.     . pantoprazole (PROTONIX) 20 MG tablet TAKE 1 TABLET BY MOUTH DAILY. 30 tablet PRN  . triamcinolone (NASACORT AQ) 55 MCG/ACT AERO nasal inhaler Place 2 sprays into the nose daily. 1 Inhaler 12  . TRUEPLUS LANCETS 28G MISC Use to help check blood sugars twice a day Dx E11.9 100 each 3  . TRUETEST TEST test strip USE AS INSTRUCTED 100 each 11   No current facility-administered medications on file prior to visit.     Allergies  Allergen Reactions  . Citrus Itching  . Invokana [Canagliflozin] Other (See Comments)    Caused frequent vaginal yeast infections  . Codeine Hives  . Metformin And Related Diarrhea and Nausea And Vomiting  . Metoclopramide Hcl Nausea And Vomiting  . Penicillins Nausea And Vomiting  . Propoxyphene N-Acetaminophen Nausea And  Vomiting  . Sulfonamide Derivatives Hives    Family History  Problem Relation Age of Onset  . Breast cancer Mother   . Diabetes Mother   . Ovarian cancer Maternal Grandmother   . Heart disease Maternal Grandmother     great  . Stomach cancer Cousin   . Diabetes Brother     x 3    BP 132/82 (BP Location: Left Arm, Patient Position: Sitting)   Pulse 80   Wt 252 lb (114.3 kg)   BMI 41.93 kg/m    Review of Systems She  denies hypoglycemia.     Objective:   Physical Exam VITAL SIGNS:  See vs page GENERAL: no distress Pulses: dorsalis pedis intact bilat.   MSK: no deformity of the feet CV: trace bilat leg edema Skin:  no ulcer on the feet.  normal color and temp on the feet. Neuro: sensation is intact to touch on the feet.   A1c=8.4%    Assessment & Plan:  Insulin-requiring type 2 DM: she needs increased rx.  Noncompliance with insulin, recurrent.   Patient is advised the following: Patient Instructions  Please increase the Tresiba to 70 units each morning, and:  continue the same diabetes pills.  On this type of insulin schedule, you should eat meals on a regular schedule.  If a meal is missed or significantly delayed, your blood sugar could go low.   check your blood sugar once a day.  vary the time of day when you check, between before the 3 meals, and at bedtime.  also check if you have symptoms of your blood sugar being too high or too low.  please keep a record of the readings and bring it to your next appointment here (or you can bring the meter itself).  You can write it on any piece of paper.  please call us sooner if your blood sugar goes below 70, or if you have a lot of readings over 200.  With the [place], if you miss the insulin, you can still take the full amount 12 hours later.   Please come back for a follow-up appointment in 2 months.

## 2016-11-07 NOTE — Patient Instructions (Addendum)
Please increase the Tresiba to 70 units each morning, and:  continue the same diabetes pills.  On this type of insulin schedule, you should eat meals on a regular schedule.  If a meal is missed or significantly delayed, your blood sugar could go low.   check your blood sugar once a day.  vary the time of day when you check, between before the 3 meals, and at bedtime.  also check if you have symptoms of your blood sugar being too high or too low.  please keep a record of the readings and bring it to your next appointment here (or you can bring the meter itself).  You can write it on any piece of paper.  please call us sooner if your blood sugar goes below 70, or if you have a lot of readings over 200.  With the [place], if you miss the insulin, you can still take the full amount 12 hours later.   Please come back for a follow-up appointment in 2 months.

## 2016-11-09 MED FILL — BROMOCRIPTINE 2.5 MG TABLET: 2.5 | 30 days supply | Qty: 15 | Fill #7

## 2016-11-18 ENCOUNTER — Encounter: Payer: Self-pay | Admitting: Endocrinology

## 2016-11-18 DIAGNOSIS — E119 Type 2 diabetes mellitus without complications: Secondary | ICD-10-CM | POA: Diagnosis not present

## 2016-11-18 DIAGNOSIS — H25012 Cortical age-related cataract, left eye: Secondary | ICD-10-CM | POA: Diagnosis not present

## 2016-11-18 DIAGNOSIS — H401112 Primary open-angle glaucoma, right eye, moderate stage: Secondary | ICD-10-CM | POA: Diagnosis not present

## 2016-11-18 DIAGNOSIS — H401121 Primary open-angle glaucoma, left eye, mild stage: Secondary | ICD-10-CM | POA: Diagnosis not present

## 2016-11-18 DIAGNOSIS — H2512 Age-related nuclear cataract, left eye: Secondary | ICD-10-CM | POA: Diagnosis not present

## 2016-11-18 LAB — HM DIABETES EYE EXAM

## 2016-11-23 ENCOUNTER — Other Ambulatory Visit: Payer: Self-pay | Admitting: Internal Medicine

## 2016-11-23 MED FILL — LYRICA 150 MG CAPSULE: 150 | 30 days supply | Qty: 60 | Fill #0

## 2016-11-29 DIAGNOSIS — H401112 Primary open-angle glaucoma, right eye, moderate stage: Secondary | ICD-10-CM | POA: Diagnosis not present

## 2016-11-29 DIAGNOSIS — H401121 Primary open-angle glaucoma, left eye, mild stage: Secondary | ICD-10-CM | POA: Diagnosis not present

## 2016-12-01 MED FILL — TRESIBA FLEXTOUCH 100 UNITS: 100 | 21 days supply | Qty: 15 | Fill #1

## 2016-12-02 DIAGNOSIS — Z113 Encounter for screening for infections with a predominantly sexual mode of transmission: Secondary | ICD-10-CM | POA: Diagnosis not present

## 2016-12-02 DIAGNOSIS — N898 Other specified noninflammatory disorders of vagina: Secondary | ICD-10-CM | POA: Diagnosis not present

## 2016-12-02 MED FILL — NYSTATIN 100,000 UNIT/GM CR: 100000 | 15 days supply | Qty: 30 | Fill #0

## 2016-12-02 MED FILL — NYSTATIN 100,000 UNIT/GM PO: 100000 | 30 days supply | Qty: 60 | Fill #0

## 2016-12-02 MED FILL — TRIAMCINOLONE 0.1% CREAM: 0.1 | 15 days supply | Qty: 30 | Fill #0

## 2016-12-02 MED FILL — FLUCONAZOLE 150 MG TABLET: 150 | 1 days supply | Qty: 1 | Fill #0

## 2016-12-08 MED FILL — BROMOCRIPTINE 2.5 MG TABLET: 2.5 | 30 days supply | Qty: 15 | Fill #8

## 2016-12-08 MED FILL — LUMIGAN 0.01% EYE DROPS: 0.01 | 30 days supply | Qty: 3 | Fill #0

## 2016-12-12 ENCOUNTER — Ambulatory Visit (HOSPITAL_COMMUNITY)
Admission: EM | Admit: 2016-12-12 | Discharge: 2016-12-12 | Disposition: A | Discharge status: Home / Self Care | Payer: 59 | Attending: Internal Medicine | Admitting: Internal Medicine

## 2016-12-12 ENCOUNTER — Encounter (HOSPITAL_COMMUNITY): Payer: Self-pay | Admitting: Emergency Medicine

## 2016-12-12 ENCOUNTER — Ambulatory Visit (INDEPENDENT_AMBULATORY_CARE_PROVIDER_SITE_OTHER): Payer: 59

## 2016-12-12 DIAGNOSIS — M25571 Pain in right ankle and joints of right foot: Secondary | ICD-10-CM | POA: Diagnosis not present

## 2016-12-12 DIAGNOSIS — M7989 Other specified soft tissue disorders: Secondary | ICD-10-CM | POA: Diagnosis not present

## 2016-12-12 DIAGNOSIS — S8264XA Nondisplaced fracture of lateral malleolus of right fibula, initial encounter for closed fracture: Secondary | ICD-10-CM

## 2016-12-12 DIAGNOSIS — S8991XA Unspecified injury of right lower leg, initial encounter: Secondary | ICD-10-CM | POA: Diagnosis not present

## 2016-12-12 MED ORDER — TRAMADOL HCL 50 MG PO TABS: 50.0000 mg | ORAL_TABLET | Freq: Four times a day (QID) | ORAL | 0 refills | Status: DC | PRN

## 2016-12-12 NOTE — Discharge Instructions (Signed)
You have a possible nondisplaced fracture of your fibula. We have placed your ankle in a cam walker and provided crutches. Please avoid bearing weight until you are seen by orthopedics. Call Dr. Laverta Baltimore office to schedule an appointment. I have also prescribed a medicine for pain called tramadol, this medicine is a narcotic, it will cause drowsiness, and it is addictive. Do not take more than what is necessary, do not drink alcohol while taking, and do not operate any heavy machinery while taking this medicine.
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
## 2016-12-13 DIAGNOSIS — H401122 Primary open-angle glaucoma, left eye, moderate stage: Secondary | ICD-10-CM | POA: Diagnosis not present

## 2016-12-13 DIAGNOSIS — H401111 Primary open-angle glaucoma, right eye, mild stage: Secondary | ICD-10-CM | POA: Diagnosis not present

## 2016-12-13 MED FILL — traMADol HCL 50 MG TABS: 50 | 4 days supply | Qty: 15 | Fill #0

## 2016-12-15 ENCOUNTER — Other Ambulatory Visit: Payer: Self-pay | Admitting: Internal Medicine

## 2016-12-15 DIAGNOSIS — S93401A Sprain of unspecified ligament of right ankle, initial encounter: Secondary | ICD-10-CM | POA: Diagnosis not present

## 2016-12-15 DIAGNOSIS — S8011XA Contusion of right lower leg, initial encounter: Secondary | ICD-10-CM | POA: Diagnosis not present

## 2016-12-15 MED FILL — CYCLOBENZAPRINE 5 MG TABLET: 5 | 90 days supply | Qty: 90 | Fill #0

## 2016-12-22 ENCOUNTER — Ambulatory Visit: Payer: 59 | Attending: Family Medicine | Admitting: Physical Therapy

## 2016-12-22 DIAGNOSIS — R262 Difficulty in walking, not elsewhere classified: Secondary | ICD-10-CM | POA: Insufficient documentation

## 2016-12-22 DIAGNOSIS — R600 Localized edema: Secondary | ICD-10-CM | POA: Diagnosis not present

## 2016-12-22 DIAGNOSIS — M25571 Pain in right ankle and joints of right foot: Secondary | ICD-10-CM | POA: Insufficient documentation

## 2016-12-22 DIAGNOSIS — M25671 Stiffness of right ankle, not elsewhere classified: Secondary | ICD-10-CM | POA: Insufficient documentation

## 2016-12-22 DIAGNOSIS — M6281 Muscle weakness (generalized): Secondary | ICD-10-CM | POA: Insufficient documentation
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
## 2016-12-28 ENCOUNTER — Ambulatory Visit: Payer: 59 | Attending: Family Medicine | Admitting: Physical Therapy

## 2016-12-28 DIAGNOSIS — R600 Localized edema: Secondary | ICD-10-CM | POA: Diagnosis not present

## 2016-12-28 DIAGNOSIS — R262 Difficulty in walking, not elsewhere classified: Secondary | ICD-10-CM | POA: Diagnosis not present

## 2016-12-28 DIAGNOSIS — M25671 Stiffness of right ankle, not elsewhere classified: Secondary | ICD-10-CM | POA: Insufficient documentation

## 2016-12-28 DIAGNOSIS — M25571 Pain in right ankle and joints of right foot: Secondary | ICD-10-CM | POA: Insufficient documentation

## 2016-12-28 DIAGNOSIS — M6281 Muscle weakness (generalized): Secondary | ICD-10-CM | POA: Diagnosis not present

## 2016-12-28 NOTE — Therapy (Signed)
[facility] Outpatient Rehabilitation [facility] [address] [place], [place], [address] Phone: [phone]   Fax:  [phone]  Physical Therapy Treatment  Patient Details  Name: Bridget [name] MRN: [id number] Date of Birth: 1961-03-17 Referring Provider: Eula Listen, MD  Encounter Date: 12/28/2016      PT End of Session - 12/28/16 0943    Visit Number 2   Number of Visits 13   Date for PT Re-Evaluation 02/03/17   Authorization Type [facility] UMR   PT Start Time 0933   PT Stop Time 1025   PT Time Calculation (min) 52 min      Past Medical History:  Diagnosis Date  . Bacterial vaginosis 12/07/2010  . Depression   . Diabetes mellitus without complication (HCC)   . Diverticulosis   . DIVERTICULOSIS, COLON 10/26/2006  . Fibromyalgia   . GERD (gastroesophageal reflux disease)   . Glaucoma   . Headache   . HEEL SPUR 09/10/2007   Qualifier: Diagnosis of  By: Lanier Prude  MD, Cathrine Muster    . Hypertension   . MALAISE AND FATIGUE 05/27/2010  . Obesity   . OBESITY, NOS 10/26/2006  . Osteoarthritis   . OSTEOARTHRITIS, KNEE 04/27/2010  . Retained tampon 05/21/2012  . SHOULDER PAIN, LEFT 05/19/2010   Qualifier: Diagnosis of  By: Clotilde Dieter MD, Amber    . Vaginal yeast infection 12/08/2010    Past Surgical History:  Procedure Laterality Date  . ABDOMINAL HYSTERECTOMY  1990  . ABDOMINAL HYSTERECTOMY    . BLADDER SURGERY  2001  . CATARACT EXTRACTION Right 2001   with implant  . EYE SURGERY     implant rt eye  . ROTATOR CUFF REPAIR Right   . SHOULDER ARTHROSCOPY WITH SUBACROMIAL DECOMPRESSION Left 07/20/2015   Procedure: SHOULDER ARTHROSCOPY WITH DEBRIDEMENT OF ROTATOR CUFF AND SUBACROMIAL DECOMPRESSION ;  Surgeon: Jones Broom, MD;  Location: [facility];  Service: Orthopedics;  Laterality: Left;  Left shoulder arthroscopy with debridement of rotator cuff and subacromial decompression    There were no vitals filed for this visit.      Subjective  Assessment - 12/28/16 0941    Subjective I am trying to walk with this cane.    Currently in Pain? Yes   Pain Score 10-Worst pain ever   Pain Orientation Right;Lateral   Pain Descriptors / Indicators Throbbing;Tingling   Aggravating Factors  weight bearing   Pain Relieving Factors ice, pain meds             [facility] PT Assessment - 12/28/16 0001      Observation/Other Assessments-Edema    Edema Circumferential     Circumferential Edema   Circumferential - Right 28 cm   Circumferential - Left  27 cm     AROM   Right Ankle Dorsiflexion --  passive +2                      [facility] Adult PT Treatment/Exercise - 12/28/16 0001      Self-Care   Self-Care Other Self-Care Comments   Other Self-Care Comments  Desensitization stratagies      Knee/Hip Exercises: Stretches   Other Knee/Hip Stretches knee to chest 2 x 30 , piriformis stretch knee to opposite shoulder 2 x 30 sec, modified figure 4  2 x 20 sec.      Knee/Hip Exercises: Aerobic   Nustep --     Knee/Hip Exercises: Standing   Gait Training Step through gait with SPC, correct hand placement,  need to retrun to crutches to work on gait mechanics and decrease pain.      Additional Ankle Exercises DO NOT USE   Towel Crunch Limitations cues to keep foot flat     Ankle Exercises: Seated   Heel Raises 10 reps   Toe Raise 10 reps   Other Seated Ankle Exercises toe yoga     Ankle Exercises: Aerobic   Stationary Bike Nustep L1 x 5 minutes, cues for small rom      Ankle Exercises: Supine   Other Supine Ankle Exercises elevated ankle ABCs , ankle pumps, ankle inversion eversion, circles,      Ankle Exercises: Stretches   Gastroc Stretch Limitations supine DF stretch with sheet 3 x 30 sec                  PT Short Term Goals - 12/22/16 1413      PT SHORT TERM GOAL #1   Title Ankle PROM in dorsiflexion to at least 6 deg by 5/18   Baseline 0 at eval   Time 3   Period Weeks   Status New     PT  SHORT TERM GOAL #2   Title Pt will demo proper heel toe through stance phase and toe roll off and swing through wearing tennis shoes and moderate use of crutches   Baseline max use of crutches and has not been putting weight in boot   Time 3   Period Weeks   Status New     PT SHORT TERM GOAL #3   Title .     PT SHORT TERM GOAL #4   Title .           PT Long Term Goals - 12/22/16 1415      PT LONG TERM GOAL #1   Title Pt will be able to ambulate to walk dogs for at least 15 min, pain <=4/10 by 6/22   Baseline unable at eval   Time 8   Period Weeks   Status New     PT LONG TERM GOAL #2   Title 5/5 strength in hip, knee and ankle to provide proper support to LE biomechanical chain   Baseline grossly 3/5  in hip and knee, 3-/5 at ankle   Time 8   Period Weeks   Status New     PT LONG TERM GOAL #3   Title Pt will be able to play with grand children with minimal disturbance from ankle   Baseline severe disturbance at eval   Time 8   Period Weeks   Status New     PT LONG TERM GOAL #4   Title FOTO to 46% ability to indicate significant improvement in functional ability   Baseline 1% ability at eval   Time 8   Period Weeks   Status New     PT LONG TERM GOAL #5   Title .               Plan - 12/28/16 1021    Clinical Impression Statement Pt presents with SPC and significant antalgic gait, holding SPC in incoreect hand. Gait training with SPC performed with cues to increase step length. Pt reports 10/10 pain with weight bearing. Advised pt to return to crutches due to high levels of pain. She reports MD follow up this week was rescheduled and she is afraid they will send her back to work prior to follow up with MD. I advised her to continue to  try to get in touch with MD and request extension on LOA from work. Began Nustep with increased pain, able to complete 5 minute with small ROM. Reviewed HEP, with pt demonstrating independence. All anke motions limited,  increased DF from 0 to 2 degrees passively. Pt is very sensitive to touch. Discussed desensitization and self massage for home to decrease sensitivity and pain, Circumferential edema measurement take with 1 cm difference RT vs Lt.    PT Next Visit Plan nu step, ankle ROM, edema management (take measurement), hip mobility; pt to bring crutches for gait with progressive weight bearing.    PT Home Exercise Plan ankle ABCs, gastroc stretch, towel scrunches, toe yoga, ice 3/day 15 min   Consulted and Agree with Plan of Care Patient;Family member/caregiver   Family Member Consulted daughter      Patient will benefit from skilled therapeutic intervention in order to improve the following deficits and impairments:  Abnormal gait, Decreased range of motion, Difficulty walking, Increased muscle spasms, Decreased activity tolerance, Pain, Improper body mechanics, Impaired flexibility, Hypomobility, Decreased balance, Decreased mobility, Decreased strength, Increased edema, Postural dysfunction  Visit Diagnosis: Pain in right ankle and joints of right foot  Stiffness of right ankle, not elsewhere classified  Localized edema  Difficulty in walking, not elsewhere classified  Muscle weakness (generalized)     Problem List Patient Active Problem List   Diagnosis Date Noted  . Plantar fasciitis 10/10/2016  . Foot pain, bilateral 09/09/2016  . Paresthesia 09/09/2016  . GERD (gastroesophageal reflux disease) 09/09/2014  . Hyperlipidemia 05/24/2013  . Lung nodule seen on imaging study 09/21/2012  . Chronic pain of multiple sites - Knees, Ankles, Back 11/09/2011  . Lupus (systemic lupus erythematosus) (HCC) 06/28/2011  . HYPOTHYROIDISM 04/27/2010  . Depression 04/27/2010  . OSTEOARTHRITIS, KNEE 04/27/2010  . Diabetes mellitus type 2, uncontrolled, with complications (HCC) 08/14/2007  . Obesity 10/26/2006  . HYPERTENSION, BENIGN SYSTEMIC 10/26/2006  . VENOUS INSUFFICIENCY, CHRONIC 10/26/2006     Sherrie Mustache, PTA 12/28/2016, 10:57 AM  [facility] [address] [place], [place], [address] Phone: [phone]   Fax:  [phone]  Name: [name] LECOMPTE MRN: [id number] Date of Birth: 1960-10-05

## 2016-12-30 ENCOUNTER — Ambulatory Visit: Payer: 59 | Admitting: Physical Therapy

## 2016-12-30 ENCOUNTER — Encounter: Payer: Self-pay | Admitting: Physical Therapy

## 2016-12-30 DIAGNOSIS — M25571 Pain in right ankle and joints of right foot: Secondary | ICD-10-CM | POA: Diagnosis not present

## 2016-12-30 DIAGNOSIS — M6281 Muscle weakness (generalized): Secondary | ICD-10-CM | POA: Diagnosis not present

## 2016-12-30 DIAGNOSIS — R262 Difficulty in walking, not elsewhere classified: Secondary | ICD-10-CM | POA: Diagnosis not present

## 2016-12-30 DIAGNOSIS — R600 Localized edema: Secondary | ICD-10-CM

## 2016-12-30 DIAGNOSIS — M25671 Stiffness of right ankle, not elsewhere classified: Secondary | ICD-10-CM | POA: Diagnosis not present

## 2016-12-30 MED FILL — UNIFINE PENTIPS 31GX3/16": 31G X 5 MM | 90 days supply | Qty: 100 | Fill #2

## 2016-12-30 MED FILL — UNIFINE PENTIPS 31GX3/16: 31G X 5 MM | 90 days supply | Qty: 100 | Fill #2

## 2016-12-30 MED FILL — TRESIBA FLEXTOUCH 100 UNITS: 100 | 21 days supply | Qty: 15 | Fill #2

## 2016-12-30 NOTE — Therapy (Signed)
[facility] Outpatient Rehabilitation [facility] [address] [place], [place], [address] Phone: [phone]   Fax:  [phone]  Physical Therapy Treatment  Patient Details  Name: Bridget [name] MRN: [id number] Date of Birth: 06-Feb-1961 Referring Provider: Eula Listen, MD  Encounter Date: 12/30/2016      PT End of Session - 12/30/16 1100    Visit Number 3   Number of Visits 13   Date for PT Re-Evaluation 02/03/17   Authorization Type [facility] UMR   PT Start Time 1100   PT Stop Time 1156   PT Time Calculation (min) 56 min   Activity Tolerance Patient tolerated treatment well   Behavior During Therapy [facility] for tasks assessed/performed      Past Medical History:  Diagnosis Date  . Bacterial vaginosis 12/07/2010  . Depression   . Diabetes mellitus without complication (HCC)   . Diverticulosis   . DIVERTICULOSIS, COLON 10/26/2006  . Fibromyalgia   . GERD (gastroesophageal reflux disease)   . Glaucoma   . Headache   . HEEL SPUR 09/10/2007   Qualifier: Diagnosis of  By: Lanier Prude  MD, Cathrine Muster    . Hypertension   . MALAISE AND FATIGUE 05/27/2010  . Obesity   . OBESITY, NOS 10/26/2006  . Osteoarthritis   . OSTEOARTHRITIS, KNEE 04/27/2010  . Retained tampon 05/21/2012  . SHOULDER PAIN, LEFT 05/19/2010   Qualifier: Diagnosis of  By: Clotilde Dieter MD, Amber    . Vaginal yeast infection 12/08/2010    Past Surgical History:  Procedure Laterality Date  . ABDOMINAL HYSTERECTOMY  1990  . ABDOMINAL HYSTERECTOMY    . BLADDER SURGERY  2001  . CATARACT EXTRACTION Right 2001   with implant  . EYE SURGERY     implant rt eye  . ROTATOR CUFF REPAIR Right   . SHOULDER ARTHROSCOPY WITH SUBACROMIAL DECOMPRESSION Left 07/20/2015   Procedure: SHOULDER ARTHROSCOPY WITH DEBRIDEMENT OF ROTATOR CUFF AND SUBACROMIAL DECOMPRESSION ;  Surgeon: Jones Broom, MD;  Location: [facility];  Service: Orthopedics;  Laterality: Left;  Left shoulder arthroscopy with debridement of  rotator cuff and subacromial decompression    There were no vitals filed for this visit.      Subjective Assessment - 12/30/16 1111    Subjective Pt reports contintinued muscle spasms on anterior and medial ankle. Feels itchy. Thinks pressure sores are returning-she was treated for these before her injury. Drove herself today using L foot, did not take pain pills.    Patient Stated Goals return to work, walk   Currently in Pain? Yes   Pain Score 6    Pain Location Ankle   Pain Orientation Right;Medial;Anterior   Pain Descriptors / Indicators Spasm                         [facility] Adult PT Treatment/Exercise - 12/30/16 0001      Knee/Hip Exercises: Standing   Gait Training pattern using crutches     Knee/Hip Exercises: Sidelying   Hip ABduction 20 reps;10 reps     Modalities   Modalities Vasopneumatic     Vasopneumatic   Number Minutes Vasopneumatic  15 minutes   Vasopnuematic Location  Ankle   Vasopneumatic Pressure Low   Vasopneumatic Temperature  34 deg     Ankle Exercises: Supine   Other Supine Ankle Exercises ABCs; DF/PF     Ankle Exercises: Stretches   Soleus Stretch 30 seconds  supine using sheet   Gastroc Stretch 30 seconds  Ankle Exercises: Seated   Towel Crunch Other (comment)  toe crunches using pillow case   Heel Raises 15 reps  heel/toe raises   BAPS Other (comment);Level 2  both directions                PT Education - 12/30/16 1113    Education provided Yes   Education Details gradual decrease of bruising, exercise form/rationale, moving toward ASO and tennis shoe rather than boot after fracture heals, proper use of crutches   Person(s) Educated Patient   Methods Explanation;Demonstration;Tactile cues;Verbal cues   Comprehension Verbalized understanding;Returned demonstration;Verbal cues required;Tactile cues required;Need further instruction          PT Short Term Goals - 12/22/16 1413      PT SHORT TERM GOAL #1    Title Ankle PROM in dorsiflexion to at least 6 deg by 5/18   Baseline 0 at eval   Time 3   Period Weeks   Status New     PT SHORT TERM GOAL #2   Title Pt will demo proper heel toe through stance phase and toe roll off and swing through wearing tennis shoes and moderate use of crutches   Baseline max use of crutches and has not been putting weight in boot   Time 3   Period Weeks   Status New     PT SHORT TERM GOAL #3   Title .     PT SHORT TERM GOAL #4   Title .           PT Long Term Goals - 12/22/16 1415      PT LONG TERM GOAL #1   Title Pt will be able to ambulate to walk dogs for at least 15 min, pain <=4/10 by 6/22   Baseline unable at eval   Time 8   Period Weeks   Status New     PT LONG TERM GOAL #2   Title 5/5 strength in hip, knee and ankle to provide proper support to LE biomechanical chain   Baseline grossly 3/5  in hip and knee, 3-/5 at ankle   Time 8   Period Weeks   Status New     PT LONG TERM GOAL #3   Title Pt will be able to play with grand children with minimal disturbance from ankle   Baseline severe disturbance at eval   Time 8   Period Weeks   Status New     PT LONG TERM GOAL #4   Title FOTO to 46% ability to indicate significant improvement in functional ability   Baseline 1% ability at eval   Time 8   Period Weeks   Status New     PT LONG TERM GOAL #5   Title .               Plan - 12/30/16 1109    Clinical Impression Statement Notable areas of possible pressure sores. Discussed transitioning to ASO and tennis shoe rather than boot to normalize gait pattern. Good movement through foot and ankle limited by edema.    PT Next Visit Plan nu step, ankle ROM, edema management (take measurement), hip mobility; pt to bring crutches for gait with progressive weight bearing.    PT Home Exercise Plan ankle ABCs, gastroc stretch, towel scrunches, toe yoga, ice 3/day 15 min   Consulted and Agree with Plan of Care Patient       Patient will benefit from skilled therapeutic intervention in order to improve  the following deficits and impairments:     Visit Diagnosis: Pain in right ankle and joints of right foot  Stiffness of right ankle, not elsewhere classified  Localized edema  Difficulty in walking, not elsewhere classified  Muscle weakness (generalized)     Problem List Patient Active Problem List   Diagnosis Date Noted  . Plantar fasciitis 10/10/2016  . Foot pain, bilateral 09/09/2016  . Paresthesia 09/09/2016  . GERD (gastroesophageal reflux disease) 09/09/2014  . Hyperlipidemia 05/24/2013  . Lung nodule seen on imaging study 09/21/2012  . Chronic pain of multiple sites - Knees, Ankles, Back 11/09/2011  . Lupus (systemic lupus erythematosus) (HCC) 06/28/2011  . HYPOTHYROIDISM 04/27/2010  . Depression 04/27/2010  . OSTEOARTHRITIS, KNEE 04/27/2010  . Diabetes mellitus type 2, uncontrolled, with complications (HCC) 08/14/2007  . Obesity 10/26/2006  . HYPERTENSION, BENIGN SYSTEMIC 10/26/2006  . VENOUS INSUFFICIENCY, CHRONIC 10/26/2006    Prithvi Kooi C. Coraleigh Sheeran PT, DPT 12/30/16 11:46 AM   [facility] [address] [place], [place], [address] Phone: [phone]   Fax:  [phone]  Name: Bridget [name] MRN: [id number] Date of Birth: July 04, 1961

## 2017-01-02 ENCOUNTER — Ambulatory Visit: Payer: 59 | Admitting: Physical Therapy

## 2017-01-02 ENCOUNTER — Encounter: Payer: Self-pay | Admitting: Physical Therapy

## 2017-01-02 DIAGNOSIS — R262 Difficulty in walking, not elsewhere classified: Secondary | ICD-10-CM

## 2017-01-02 DIAGNOSIS — M25571 Pain in right ankle and joints of right foot: Secondary | ICD-10-CM

## 2017-01-02 DIAGNOSIS — M25671 Stiffness of right ankle, not elsewhere classified: Secondary | ICD-10-CM | POA: Diagnosis not present

## 2017-01-02 DIAGNOSIS — M6281 Muscle weakness (generalized): Secondary | ICD-10-CM | POA: Diagnosis not present

## 2017-01-02 DIAGNOSIS — R600 Localized edema: Secondary | ICD-10-CM | POA: Diagnosis not present

## 2017-01-02 MED FILL — SM ALCOHOL 70% PREP PADS: 25 days supply | Qty: 100 | Fill #1

## 2017-01-02 NOTE — Therapy (Signed)
[facility] [address] [place], [place], [address] Phone: [phone]   Fax:  [phone]  Physical Therapy Treatment  Patient Details  Name: Bridget [name] MRN: [id number] Date of Birth: 12/08/1960 Referring Provider: Eula Listenominic McKinley, MD  Encounter Date: 01/02/2017      PT End of Session - 01/02/17 1419    Visit Number 4   Number of Visits 13   Date for PT Re-Evaluation 02/03/17   Authorization Type [facility] UMR   PT Start Time 1419   PT Stop Time 1512   PT Time Calculation (min) 53 min   Activity Tolerance Patient tolerated treatment well   Behavior During Therapy [facility] for tasks assessed/performed      Past Medical History:  Diagnosis Date  . Bacterial vaginosis 12/07/2010  . Depression   . Diabetes mellitus without complication (HCC)   . Diverticulosis   . DIVERTICULOSIS, COLON 10/26/2006  . Fibromyalgia   . GERD (gastroesophageal reflux disease)   . Glaucoma   . Headache   . HEEL SPUR 09/10/2007   Qualifier: Diagnosis of  By: Lanier PrudeBolden  MD, Cathrine Musteraineisha    . Hypertension   . MALAISE AND FATIGUE 05/27/2010  . Obesity   . OBESITY, NOS 10/26/2006  . Osteoarthritis   . OSTEOARTHRITIS, KNEE 04/27/2010  . Retained tampon 05/21/2012  . SHOULDER PAIN, LEFT 05/19/2010   Qualifier: Diagnosis of  By: Clotilde DieterStrother MD, Amber    . Vaginal yeast infection 12/08/2010    Past Surgical History:  Procedure Laterality Date  . ABDOMINAL HYSTERECTOMY  1990  . ABDOMINAL HYSTERECTOMY    . BLADDER SURGERY  2001  . CATARACT EXTRACTION Right 2001   with implant  . EYE SURGERY     implant rt eye  . ROTATOR CUFF REPAIR Right   . SHOULDER ARTHROSCOPY WITH SUBACROMIAL DECOMPRESSION Left 07/20/2015   Procedure: SHOULDER ARTHROSCOPY WITH DEBRIDEMENT OF ROTATOR CUFF AND SUBACROMIAL DECOMPRESSION ;  Surgeon: Jones BroomJustin Chandler, MD;  Location: [facility];  Service: Orthopedics;  Laterality: Left;  Left shoulder arthroscopy with debridement of  rotator cuff and subacromial decompression    There were no vitals filed for this visit.      Subjective Assessment - 01/02/17 1423    Subjective pt reports she has been trying to put a tiny bit of weight through foot without boot on. Going to MD following apt here today. R hip is bothering her. Feels better with use of crutches.    Patient Stated Goals return to work, walk   Currently in Pain? Yes   Pain Score 5    Pain Location Ankle   Pain Orientation Right   Pain Descriptors / Indicators Spasm;Tingling;Burning                         [facility] Adult PT Treatment/Exercise - 01/02/17 0001      Knee/Hip Exercises: Stretches   Gastroc Stretch 2 reps;30 seconds   Gastroc Stretch Limitations long sitting with green strap     Knee/Hip Exercises: Aerobic   Nustep 5 min L3 LE only     Vasopneumatic   Number Minutes Vasopneumatic  15 minutes   Vasopnuematic Location  Ankle   Vasopneumatic Pressure Low   Vasopneumatic Temperature  34 deg     Manual Therapy   Manual Therapy Soft tissue mobilization;Joint mobilization   Manual therapy comments tuning fork check on melleoli   Joint Mobilization midfoot   Soft tissue mobilization IASTM ant tib,  gastroc/soleus, peroneals; edema mobilization     Ankle Exercises: Seated   Towel Inversion/Eversion Other (comment)  long sitting resisted inversion   Other Seated Ankle Exercises long sitting resisted DF yellow                PT Education - 01/02/17 1425    Education provided Yes   Education Details exercise form/rationale   Person(s) Educated Patient   Methods Explanation;Demonstration;Tactile cues;Verbal cues   Comprehension Verbalized understanding;Returned demonstration;Verbal cues required;Tactile cues required;Need further instruction          PT Short Term Goals - 12/22/16 1413      PT SHORT TERM GOAL #1   Title Ankle PROM in dorsiflexion to at least 6 deg by 5/18   Baseline 0 at eval   Time 3    Period Weeks   Status New     PT SHORT TERM GOAL #2   Title Pt will demo proper heel toe through stance phase and toe roll off and swing through wearing tennis shoes and moderate use of crutches   Baseline max use of crutches and has not been putting weight in boot   Time 3   Period Weeks   Status New     PT SHORT TERM GOAL #3   Title .     PT SHORT TERM GOAL #4   Title .           PT Long Term Goals - 12/22/16 1415      PT LONG TERM GOAL #1   Title Pt will be able to ambulate to walk dogs for at least 15 min, pain <=4/10 by 6/22   Baseline unable at eval   Time 8   Period Weeks   Status New     PT LONG TERM GOAL #2   Title 5/5 strength in hip, knee and ankle to provide proper support to LE biomechanical chain   Baseline grossly 3/5  in hip and knee, 3-/5 at ankle   Time 8   Period Weeks   Status New     PT LONG TERM GOAL #3   Title Pt will be able to play with grand children with minimal disturbance from ankle   Baseline severe disturbance at eval   Time 8   Period Weeks   Status New     PT LONG TERM GOAL #4   Title FOTO to 46% ability to indicate significant improvement in functional ability   Baseline 1% ability at eval   Time 8   Period Weeks   Status New     PT LONG TERM GOAL #5   Title .               Plan - 01/02/17 1501    Clinical Impression Statement concordant muscle spasm pain found in trigger points in lower leg musculature and decreased following manual treatment. is going to MD today, will determine further progression if she is freed from boot. Discomfort at lateral maleolus with tuning fork but not high pain. Edema noted in foot and ankle. g   PT Next Visit Plan gait pattern, ankle ROM, progressive weight bearing   PT Home Exercise Plan ankle ABCs, gastroc stretch, towel scrunches, toe yoga, ice 3/day 15 min   Consulted and Agree with Plan of Care Patient      Patient will benefit from skilled therapeutic intervention in order to  improve the following deficits and impairments:     Visit Diagnosis: Pain in right  ankle and joints of right foot  Stiffness of right ankle, not elsewhere classified  Localized edema  Difficulty in walking, not elsewhere classified     Problem List Patient Active Problem List   Diagnosis Date Noted  . Plantar fasciitis 10/10/2016  . Foot pain, bilateral 09/09/2016  . Paresthesia 09/09/2016  . GERD (gastroesophageal reflux disease) 09/09/2014  . Hyperlipidemia 05/24/2013  . Lung nodule seen on imaging study 09/21/2012  . Chronic pain of multiple sites - Knees, Ankles, Back 11/09/2011  . Lupus (systemic lupus erythematosus) (HCC) 06/28/2011  . HYPOTHYROIDISM 04/27/2010  . Depression 04/27/2010  . OSTEOARTHRITIS, KNEE 04/27/2010  . Diabetes mellitus type 2, uncontrolled, with complications (HCC) 08/14/2007  . Obesity 10/26/2006  . HYPERTENSION, BENIGN SYSTEMIC 10/26/2006  . VENOUS INSUFFICIENCY, CHRONIC 10/26/2006    Carmell Elgin C. Milbert Bixler PT, DPT 01/02/17 3:04 PM   [facility] Out[facility] [address] [place], [place], [address] Phone: [phone]   Fax:  [phone]  Name: Bridget [name] MRN: [id number] Date of Birth: Apr 26, 1961

## 2017-01-05 ENCOUNTER — Encounter: Payer: Self-pay | Admitting: Physical Therapy

## 2017-01-05 ENCOUNTER — Ambulatory Visit: Payer: 59 | Admitting: Physical Therapy

## 2017-01-05 DIAGNOSIS — M25671 Stiffness of right ankle, not elsewhere classified: Secondary | ICD-10-CM | POA: Diagnosis not present

## 2017-01-05 DIAGNOSIS — R600 Localized edema: Secondary | ICD-10-CM

## 2017-01-05 DIAGNOSIS — R262 Difficulty in walking, not elsewhere classified: Secondary | ICD-10-CM | POA: Diagnosis not present

## 2017-01-05 DIAGNOSIS — M6281 Muscle weakness (generalized): Secondary | ICD-10-CM | POA: Diagnosis not present

## 2017-01-05 DIAGNOSIS — M25571 Pain in right ankle and joints of right foot: Secondary | ICD-10-CM | POA: Diagnosis not present

## 2017-01-05 NOTE — Therapy (Signed)
[facility] Outpatient Rehabilitation [facility] [place] [facility] [address] [place], [place], [address] Phone: [phone]   Fax:  [phone]  Physical Therapy Treatment  Patient Details  Name: Bridget [name] MRN: [id number] Date of Birth: February 12, 1961 Referring Provider: Eula Listen, MD  Encounter Date: 01/05/2017      PT End of Session - 01/05/17 1329    Visit Number 5   Number of Visits 13   Date for PT Re-Evaluation 02/03/17   Authorization Type [facility] UMR   PT Start Time 1330   PT Stop Time 1433   PT Time Calculation (min) 63 min   Activity Tolerance Patient tolerated treatment well   Behavior During Therapy [facility] for tasks assessed/performed      Past Medical History:  Diagnosis Date  . Bacterial vaginosis 12/07/2010  . Depression   . Diabetes mellitus without complication (HCC)   . Diverticulosis   . DIVERTICULOSIS, COLON 10/26/2006  . Fibromyalgia   . GERD (gastroesophageal reflux disease)   . Glaucoma   . Headache   . HEEL SPUR 09/10/2007   Qualifier: Diagnosis of  By: Lanier Prude  MD, Cathrine Muster    . Hypertension   . MALAISE AND FATIGUE 05/27/2010  . Obesity   . OBESITY, NOS 10/26/2006  . Osteoarthritis   . OSTEOARTHRITIS, KNEE 04/27/2010  . Retained tampon 05/21/2012  . SHOULDER PAIN, LEFT 05/19/2010   Qualifier: Diagnosis of  By: Clotilde Dieter MD, Amber    . Vaginal yeast infection 12/08/2010    Past Surgical History:  Procedure Laterality Date  . ABDOMINAL HYSTERECTOMY  1990  . ABDOMINAL HYSTERECTOMY    . BLADDER SURGERY  2001  . CATARACT EXTRACTION Right 2001   with implant  . EYE SURGERY     implant rt eye  . ROTATOR CUFF REPAIR Right   . SHOULDER ARTHROSCOPY WITH SUBACROMIAL DECOMPRESSION Left 07/20/2015   Procedure: SHOULDER ARTHROSCOPY WITH DEBRIDEMENT OF ROTATOR CUFF AND SUBACROMIAL DECOMPRESSION ;  Surgeon: Jones Broom, MD;  Location: [facility];  Service: Orthopedics;  Laterality: Left;  Left shoulder arthroscopy with debridement of  rotator cuff and subacromial decompression    There were no vitals filed for this visit.      Subjective Assessment - 01/05/17 1330    Subjective Is now in ASO and tennis shoes using single point cane. Is going to return to work but has to navigate a small hill to get to work. Still experiencing muscle spasm. Sopped taking pain medications in order to be able to drive.    Patient Stated Goals return to work, walk   Currently in Pain? Yes   Pain Score 5    Pain Location Ankle   Pain Orientation Right   Pain Descriptors / Indicators Spasm   Pain Radiating Towards spasm at lateral foot and ankle.    Aggravating Factors  walking, muscle spasms   Pain Relieving Factors ice                         [facility] Adult PT Treatment/Exercise - 01/05/17 0001      Knee/Hip Exercises: Stretches   Gastroc Stretch 30 seconds   Gastroc Stretch Limitations slant board   Soleus Stretch 30 seconds   Soleus Stretch Limitations slant board     Knee/Hip Exercises: Aerobic   Nustep 5 min L4     Knee/Hip Exercises: Standing   Other Standing Knee Exercises lateral & A/P weight shift     Vasopneumatic   Number Minutes Vasopneumatic  15 minutes   Vasopnuematic Location  Ankle   Vasopneumatic Pressure Low   Vasopneumatic Temperature  34 deg     Ankle Exercises: Supine   T-Band yellow 4-way x20 each                PT Education - 01/05/17 1509    Education provided Yes   Education Details exercise form/rationale, elevating and exercising at work, proper donning of ASO   Starwood HotelsPerson(s) Educated Patient   Methods Explanation;Demonstration;Tactile cues;Verbal cues;Handout   Comprehension Verbalized understanding;Returned demonstration;Verbal cues required;Tactile cues required;Need further instruction          PT Short Term Goals - 12/22/16 1413      PT SHORT TERM GOAL #1   Title Ankle PROM in dorsiflexion to at least 6 deg by 5/18   Baseline 0 at eval   Time 3   Period  Weeks   Status New     PT SHORT TERM GOAL #2   Title Pt will demo proper heel toe through stance phase and toe roll off and swing through wearing tennis shoes and moderate use of crutches   Baseline max use of crutches and has not been putting weight in boot   Time 3   Period Weeks   Status New     PT SHORT TERM GOAL #3   Title .     PT SHORT TERM GOAL #4   Title .           PT Long Term Goals - 12/22/16 1415      PT LONG TERM GOAL #1   Title Pt will be able to ambulate to walk dogs for at least 15 min, pain <=4/10 by 6/22   Baseline unable at eval   Time 8   Period Weeks   Status New     PT LONG TERM GOAL #2   Title 5/5 strength in hip, knee and ankle to provide proper support to LE biomechanical chain   Baseline grossly 3/5  in hip and knee, 3-/5 at ankle   Time 8   Period Weeks   Status New     PT LONG TERM GOAL #3   Title Pt will be able to play with grand children with minimal disturbance from ankle   Baseline severe disturbance at eval   Time 8   Period Weeks   Status New     PT LONG TERM GOAL #4   Title FOTO to 46% ability to indicate significant improvement in functional ability   Baseline 1% ability at eval   Time 8   Period Weeks   Status New     PT LONG TERM GOAL #5   Title .               Plan - 01/05/17 1506    Clinical Impression Statement Pt with notable improvement in active mobility of ankle joint after being out of her boot for a few days. Good gait pattern without antalgic presentation using single point cane. Educated pt on proper donning of ASO to provide stability as well as exercises to perform at work to decrease swelling and stiffness.    PT Next Visit Plan weight acceptance, ankle ROM and strength, hip strength   PT Home Exercise Plan ankle ABCs, gastroc stretch, towel scrunches, toe yoga, ice 3/day 15 min; work- heel/toe raises, resisted PF, gastroc stretch, LAQ with ankle pumps, plantar fascia rolling;    Consulted and  Agree with Plan of Care Patient  Patient will benefit from skilled therapeutic intervention in order to improve the following deficits and impairments:     Visit Diagnosis: Pain in right ankle and joints of right foot  Stiffness of right ankle, not elsewhere classified  Localized edema  Difficulty in walking, not elsewhere classified  Muscle weakness (generalized)     Problem List Patient Active Problem List   Diagnosis Date Noted  . Plantar fasciitis 10/10/2016  . Foot pain, bilateral 09/09/2016  . Paresthesia 09/09/2016  . GERD (gastroesophageal reflux disease) 09/09/2014  . Hyperlipidemia 05/24/2013  . Lung nodule seen on imaging study 09/21/2012  . Chronic pain of multiple sites - Knees, Ankles, Back 11/09/2011  . Lupus (systemic lupus erythematosus) (HCC) 06/28/2011  . HYPOTHYROIDISM 04/27/2010  . Depression 04/27/2010  . OSTEOARTHRITIS, KNEE 04/27/2010  . Diabetes mellitus type 2, uncontrolled, with complications (HCC) 08/14/2007  . Obesity 10/26/2006  . HYPERTENSION, BENIGN SYSTEMIC 10/26/2006  . VENOUS INSUFFICIENCY, CHRONIC 10/26/2006    Levetta Bognar C. Makina Skow PT, DPT 01/05/17 3:11 PM   [facility] Outpatient Rehabilitation [facility] [address] [place], [place], [address] Phone: [phone]   Fax:  [phone]  Name: Bridget [name] MRN: [id number] Date of Birth: Aug 12, 1961

## 2017-01-09 ENCOUNTER — Ambulatory Visit: Payer: 59 | Admitting: Physical Therapy

## 2017-01-09 ENCOUNTER — Encounter: Payer: Self-pay | Admitting: Physical Therapy

## 2017-01-09 DIAGNOSIS — M25571 Pain in right ankle and joints of right foot: Secondary | ICD-10-CM | POA: Diagnosis not present

## 2017-01-09 DIAGNOSIS — R600 Localized edema: Secondary | ICD-10-CM | POA: Diagnosis not present

## 2017-01-09 DIAGNOSIS — M25671 Stiffness of right ankle, not elsewhere classified: Secondary | ICD-10-CM | POA: Diagnosis not present

## 2017-01-09 DIAGNOSIS — M6281 Muscle weakness (generalized): Secondary | ICD-10-CM | POA: Diagnosis not present

## 2017-01-09 DIAGNOSIS — R262 Difficulty in walking, not elsewhere classified: Secondary | ICD-10-CM | POA: Diagnosis not present

## 2017-01-09 NOTE — Therapy (Signed)
[facility] Outpatient Rehabilitation [facility] [address] [place], [place], [address] Phone: [phone]   Fax:  [phone]  Physical Therapy Treatment  Patient Details  Name: Bridget [name] MRN: [id number] Date of Birth: [birth year] Referring Provider: Eula Listen, MD  Encounter Date: 01/09/2017      PT End of Session - 01/09/17 1500    Visit Number 6   Number of Visits 13   Date for PT Re-Evaluation 02/03/17   Authorization Type [facility]   PT Start Time 1500   PT Stop Time 1555   PT Time Calculation (min) 55 min   Activity Tolerance Patient tolerated treatment well   Behavior During Therapy [facility] for tasks assessed/performed      Past Medical History:  Diagnosis Date  . Bacterial vaginosis 12/07/2010  . Depression   . Diabetes mellitus without complication (HCC)   . Diverticulosis   . DIVERTICULOSIS, COLON 10/26/2006  . Fibromyalgia   . GERD (gastroesophageal reflux disease)   . Glaucoma   . Headache   . HEEL SPUR 09/10/2007   Qualifier: Diagnosis of  By: Lanier Prude  MD, Cathrine Muster    . Hypertension   . MALAISE AND FATIGUE 05/27/2010  . Obesity   . OBESITY, NOS 10/26/2006  . Osteoarthritis   . OSTEOARTHRITIS, KNEE 04/27/2010  . Retained tampon 05/21/2012  . SHOULDER PAIN, LEFT 05/19/2010   Qualifier: Diagnosis of  By: Clotilde Dieter MD, Amber    . Vaginal yeast infection 12/08/2010    Past Surgical History:  Procedure Laterality Date  . ABDOMINAL HYSTERECTOMY  1990  . ABDOMINAL HYSTERECTOMY    . BLADDER SURGERY  2001  . CATARACT EXTRACTION Right 2001   with implant  . EYE SURGERY     implant rt eye  . ROTATOR CUFF REPAIR Right   . SHOULDER ARTHROSCOPY WITH SUBACROMIAL DECOMPRESSION Left 07/20/2015   Procedure: SHOULDER ARTHROSCOPY WITH DEBRIDEMENT OF ROTATOR CUFF AND SUBACROMIAL DECOMPRESSION ;  Surgeon: Jones Broom, MD;  Location: [facility];  Service: Orthopedics;  Laterality: Left;  Left shoulder arthroscopy with debridement of  rotator cuff and subacromial decompression    There were no vitals filed for this visit.      Subjective Assessment - 01/09/17 1500    Subjective Pt reports pain in her whole R leg. R hip had bothered her in the boot and is now increased in pain. Still having occasional muscle spasms in ankle, still throbbing. Props her ankle up as much as she can at work.    Patient Stated Goals return to work, walk   Currently in Pain? Yes   Pain Score 6    Pain Location --  R hip and ankle   Pain Orientation Right   Pain Descriptors / Indicators Sore   Aggravating Factors  walking   Pain Relieving Factors ice and elevation                         [facility] Adult PT Treatment/Exercise - 01/09/17 0001      Knee/Hip Exercises: Stretches   Gastroc Stretch Limitations long sitting with green strap gastroc+hamstrings 2x30s   Other Knee/Hip Stretches R figure 4     Knee/Hip Exercises: Aerobic   Recumbent Bike 6 min L2     Knee/Hip Exercises: Sidelying   Hip ABduction Right;15 reps     Vasopneumatic   Number Minutes Vasopneumatic  15 minutes   Vasopnuematic Location  Ankle   Vasopneumatic Pressure Low   Vasopneumatic Temperature  34 deg     Manual Therapy   Joint Mobilization long axis R hip distraction Gr3   Soft tissue mobilization roller to R hip     Ankle Exercises: Seated   BAPS Other (comment);Sitting;Level 2  both directions   Other Seated Ankle Exercises heel/toe raises   Other Seated Ankle Exercises long sitting 4 way yellow, blue for plantarflexion                  PT Short Term Goals - 12/22/16 1413      PT SHORT TERM GOAL #1   Title Ankle PROM in dorsiflexion to at least 6 deg by 5/18   Baseline 0 at eval   Time 3   Period Weeks   Status New     PT SHORT TERM GOAL #2   Title Pt will demo proper heel toe through stance phase and toe roll off and swing through wearing tennis shoes and moderate use of crutches   Baseline max use of crutches and  has not been putting weight in boot   Time 3   Period Weeks   Status New     PT SHORT TERM GOAL #3   Title .     PT SHORT TERM GOAL #4   Title .           PT Long Term Goals - 12/22/16 1415      PT LONG TERM GOAL #1   Title Pt will be able to ambulate to walk dogs for at least 15 min, pain <=4/10 by 6/22   Baseline unable at eval   Time 8   Period Weeks   Status New     PT LONG TERM GOAL #2   Title 5/5 strength in hip, knee and ankle to provide proper support to LE biomechanical chain   Baseline grossly 3/5  in hip and knee, 3-/5 at ankle   Time 8   Period Weeks   Status New     PT LONG TERM GOAL #3   Title Pt will be able to play with grand children with minimal disturbance from ankle   Baseline severe disturbance at eval   Time 8   Period Weeks   Status New     PT LONG TERM GOAL #4   Title FOTO to 46% ability to indicate significant improvement in functional ability   Baseline 1% ability at eval   Time 8   Period Weeks   Status New     PT LONG TERM GOAL #5   Title .               Plan - 01/09/17 1543    Clinical Impression Statement Notable improvement in ankle mobility and coordination with exercises today. Decreased hip pain with manual and stretching. Will progress to standing activities next visit as tolerated.    PT Next Visit Plan progress to standing as tolerated   PT Home Exercise Plan ankle ABCs, gastroc stretch, towel scrunches, toe yoga, ice 3/day 15 min; work- heel/toe raises, resisted PF, gastroc stretch, LAQ with ankle pumps, plantar fascia rolling;    Consulted and Agree with Plan of Care Patient      Patient will benefit from skilled therapeutic intervention in order to improve the following deficits and impairments:     Visit Diagnosis: Pain in right ankle and joints of right foot  Stiffness of right ankle, not elsewhere classified  Localized edema     Problem List Patient Active Problem  List   Diagnosis Date Noted  .  Plantar fasciitis 10/10/2016  . Foot pain, bilateral 09/09/2016  . Paresthesia 09/09/2016  . GERD (gastroesophageal reflux disease) 09/09/2014  . Hyperlipidemia 05/24/2013  . Lung nodule seen on imaging study 09/21/2012  . Chronic pain of multiple sites - Knees, Ankles, Back 11/09/2011  . Lupus (systemic lupus erythematosus) (HCC) 06/28/2011  . HYPOTHYROIDISM 04/27/2010  . Depression 04/27/2010  . OSTEOARTHRITIS, KNEE 04/27/2010  . Diabetes mellitus type 2, uncontrolled, with complications (HCC) 08/14/2007  . Obesity 10/26/2006  . HYPERTENSION, BENIGN SYSTEMIC 10/26/2006  . VENOUS INSUFFICIENCY, CHRONIC 10/26/2006    Keshayla Schrum C. Shajuan Musso PT, DPT 01/09/17 3:45 PM   [facility] [address] [place], [place], [address] Phone: [phone]   Fax:  [phone]  Name: Bridget [name] MRN: [id number] Date of Birth: 1961/05/23

## 2017-01-11 ENCOUNTER — Ambulatory Visit: Payer: 59 | Admitting: Physical Therapy

## 2017-01-11 MED FILL — BROMOCRIPTINE 2.5 MG TABLET: 2.5 | 30 days supply | Qty: 15 | Fill #9

## 2017-01-11 MED FILL — PANTOPRAZOLE SOD DR 20 MG T: 20 | 90 days supply | Qty: 90 | Fill #1

## 2017-01-13 ENCOUNTER — Ambulatory Visit (INDEPENDENT_AMBULATORY_CARE_PROVIDER_SITE_OTHER): Payer: 59 | Admitting: Endocrinology

## 2017-01-13 ENCOUNTER — Other Ambulatory Visit: Payer: Self-pay | Admitting: Internal Medicine

## 2017-01-13 ENCOUNTER — Encounter: Payer: Self-pay | Admitting: Endocrinology

## 2017-01-13 VITALS — BP 124/82 | HR 84 | Wt 251.0 lb

## 2017-01-13 DIAGNOSIS — Z794 Long term (current) use of insulin: Secondary | ICD-10-CM

## 2017-01-13 DIAGNOSIS — IMO0002 Reserved for concepts with insufficient information to code with codable children: Secondary | ICD-10-CM

## 2017-01-13 DIAGNOSIS — E118 Type 2 diabetes mellitus with unspecified complications: Secondary | ICD-10-CM | POA: Diagnosis not present

## 2017-01-13 DIAGNOSIS — E1165 Type 2 diabetes mellitus with hyperglycemia: Secondary | ICD-10-CM

## 2017-01-13 LAB — POCT GLYCOSYLATED HEMOGLOBIN (HGB A1C): Hemoglobin A1C: 8.4

## 2017-01-13 MED ORDER — INSULIN DEGLUDEC 100 UNIT/ML ~~LOC~~ SOPN: 60.0000 [IU] | PEN_INJECTOR | Freq: Every day | SUBCUTANEOUS | 11 refills | Status: DC

## 2017-01-13 MED ORDER — INSULIN LISPRO 100 UNIT/ML (KWIKPEN): 15.0000 [IU] | PEN_INJECTOR | Freq: Every day | SUBCUTANEOUS | 11 refills | Status: DC

## 2017-01-13 MED FILL — LUMIGAN 0.01% EYE DROPS: 0.01 | 30 days supply | Qty: 3 | Fill #1

## 2017-01-13 MED FILL — HUMALOG 100 UNITS/ML KWIKPE: 100 | 90 days supply | Qty: 15 | Fill #0

## 2017-01-13 MED FILL — LISINOPRIL 5 MG TABLET: 5 | 30 days supply | Qty: 30 | Fill #0

## 2017-01-13 NOTE — Progress Notes (Signed)
Subjective:    Patient ID: Bridget [name], female    DOB: October 26, 1960, 56 y.o.   MRN: [id number]  HPI Pt returns for f/u of diabetes mellitus: DM type: Insulin-requiring type 2 Dx'ed: 2330 Complications: polyneuropathy.   Therapy: insulin + 2 oral meds.   GDM: never.  DKA: never.   Severe hypoglycemia: never.  Pancreatitis: never.  Other: she did not tolerate invokana (vaginitis) or metformin-XR (diarrhea); edema precludes pioglitizone rx.  she took insulin from 2000-2003; in 2017, She declines multiple daily injections; she works 3rd shift.  Interval history: she brings a record of her cbg's which I have reviewed today.  It varies from 93-200's.  It is lowest after sleeping during the day.  She says she occcasionally misses the tresiba, or takes less than the prescribed amount, if the pen runs out. Past Medical History:  Diagnosis Date  . Bacterial vaginosis 12/07/2010  . Depression   . Diabetes mellitus without complication ([place])   . Diverticulosis   . DIVERTICULOSIS, COLON 10/26/2006  . Fibromyalgia   . GERD (gastroesophageal reflux disease)   . Glaucoma   . Headache   . HEEL SPUR 09/10/2007   Qualifier: Diagnosis of  By: Drue Flirt  MD, Merrily Brittle    . Hypertension   . MALAISE AND FATIGUE 05/27/2010  . Obesity   . OBESITY, NOS 10/26/2006  . Osteoarthritis   . OSTEOARTHRITIS, KNEE 04/27/2010  . Retained tampon 05/21/2012  . SHOULDER PAIN, LEFT 05/19/2010   Qualifier: Diagnosis of  By: Annamary Carolin MD, Amber    . Vaginal yeast infection 12/08/2010    Past Surgical History:  Procedure Laterality Date  . ABDOMINAL HYSTERECTOMY  1990  . ABDOMINAL HYSTERECTOMY    . BLADDER SURGERY  2001  . CATARACT EXTRACTION Right 2001   with implant  . EYE SURGERY     implant rt eye  . ROTATOR CUFF REPAIR Right   . SHOULDER ARTHROSCOPY WITH SUBACROMIAL DECOMPRESSION Left 07/20/2015   Procedure: SHOULDER ARTHROSCOPY WITH DEBRIDEMENT OF ROTATOR CUFF AND SUBACROMIAL DECOMPRESSION ;  Surgeon: Tania Ade, MD;  Location: [facility];  Service: Orthopedics;  Laterality: Left;  Left shoulder arthroscopy with debridement of rotator cuff and subacromial decompression    Social History   Social History  . Marital status: Divorced    Spouse name: N/A  . Number of children: 2  . Years of education: N/A   Occupational History  . PBX operator [employer]   Social History Main Topics  . Smoking status: Former Smoker    Packs/day: 0.30    Types: Cigarettes    Quit date: 08/29/1998  . Smokeless tobacco: Never Used     Comment: Quit in 2000   . Alcohol use Yes     Comment: socially  . Drug use: No  . Sexual activity: Yes    Birth control/ protection: Surgical   Other Topics Concern  . Not on file   Social History Narrative   Works at [employer] as a Museum/gallery conservator    Current Outpatient Prescriptions on File Prior to Visit  Medication Sig Dispense Refill  . Alcohol Swabs (ALCOHOL PREPS) PADS 1 Device by Does not apply route 4 (four) times daily -  before meals and at bedtime. 100 each 11  . amitriptyline (ELAVIL) 25 MG tablet Take 0.5 tablets (12.5 mg total) by mouth at bedtime. 30 tablet 2  . aspirin 81 MG tablet Take 1 tablet (81 mg total) by mouth daily.    . Blood  Glucose Monitoring Suppl (ACCU-CHEK GUIDE) w/Device KIT 2 each by Does not apply route daily. 1 kit 2  . bromocriptine (PARLODEL) 2.5 MG tablet Take 0.5 tablets (1.25 mg total) by mouth daily. 15 tablet 11  . cetirizine (ZYRTEC) 10 MG tablet Take 1 tablet (10 mg total) by mouth daily. 30 tablet 11  . colesevelam (WELCHOL) 625 MG tablet Take 2 tablets (1,250 mg total) by mouth 2 (two) times daily with a meal. 60 tablet 11  . Cranberry-Vitamin C-Probiotic (AZO CRANBERRY PO) Take by mouth daily. Reported on 10/13/2015    . cyclobenzaprine (FLEXERIL) 5 MG tablet Take 1 tablet (5 mg total) by mouth 3 (three) times daily as needed for muscle spasms. Needs visit for refills 90 tablet 0  . diclofenac sodium  (VOLTAREN) 1 % GEL Apply 2 g topically 4 (four) times daily. Rub into affected area of foot 2 to 4 times daily 100 g 2  . docusate sodium (COLACE) 100 MG capsule Take 1 capsule (100 mg total) by mouth 3 (three) times daily as needed. 20 capsule 0  . glucose blood (ACCU-CHEK GUIDE) test strip Use to check blood sugar 2 times per day. Dx code: E11.9 200 each 2  . Lancets (ACCU-CHEK MULTICLIX) lancets Use to check blood sugar 2 times per day. Dx code E11.9 200 each 2  . LYRICA 25 MG capsule TAKE 1 CAPSULE BY MOUTH TWICE DAILY 60 capsule 0  . NALTREXONE HCL PO Take 3 mg by mouth daily.    . NONFORMULARY OR COMPOUNDED ITEM Take 1 tablet by mouth at bedtime. Reported on 03/08/2016    . NONFORMULARY OR COMPOUNDED ITEM [facility]: Achillies Tendonitis Cream - Diclofenac 3%, Baclofen 2%, Bupivacaine 1%, Gabapentin 6%, Ibuprofen 3%, Pentoxifylline 3%, apply 1-2 gram to feet 3-4 times daily. 120 each 0  . ondansetron (ZOFRAN ODT) 8 MG disintegrating tablet Take 1 tablet (8 mg total) by mouth every 8 (eight) hours as needed for nausea or vomiting. 20 tablet 0  . OVER THE COUNTER MEDICATION 1 capsule daily.     . pantoprazole (PROTONIX) 20 MG tablet TAKE 1 TABLET BY MOUTH DAILY. 30 tablet PRN  . pregabalin (LYRICA) 150 MG capsule Take 1 capsule (150 mg total) by mouth 2 (two) times daily. Needs visit for more refill 60 capsule 1  . traMADol (ULTRAM) 50 MG tablet Take 1 tablet (50 mg total) by mouth every 6 (six) hours as needed. 15 tablet 0  . triamcinolone (NASACORT AQ) 55 MCG/ACT AERO nasal inhaler Place 2 sprays into the nose daily. 1 Inhaler 12  . TRUEPLUS LANCETS 28G MISC Use to help check blood sugars twice a day Dx E11.9 100 each 3  . TRUETEST TEST test strip USE AS INSTRUCTED 100 each 11   No current facility-administered medications on file prior to visit.     Allergies  Allergen Reactions  . Citrus Itching  . Invokana [Canagliflozin] Other (See Comments)    Caused frequent vaginal yeast  infections  . Codeine Hives  . Metformin And Related Diarrhea and Nausea And Vomiting  . Metoclopramide Hcl Nausea And Vomiting  . Penicillins Nausea And Vomiting  . Propoxyphene N-Acetaminophen Nausea And Vomiting  . Sulfonamide Derivatives Hives    Family History  Problem Relation Age of Onset  . Breast cancer Mother   . Diabetes Mother   . Ovarian cancer Maternal Grandmother   . Heart disease Maternal Grandmother        great  . Stomach cancer Cousin   .  Diabetes Brother        x 3    BP 124/82 (BP Location: Left Arm, Patient Position: Sitting)   Pulse 84   Wt 251 lb (113.9 kg)   SpO2 96%   BMI 41.77 kg/m   Review of Systems She denies hypoglycemia.     Objective:   Physical Exam VITAL SIGNS:  See vs page GENERAL: no distress Pulses: dorsalis pedis intact bilat.   MSK: no deformity of the feet CV: trace right leg edema Skin:  no ulcer on the feet.  normal temp on the feet.  There is hyperpigmentation at the right ankle, at the site of recent ankle fx Neuro: sensation is intact to touch on the feet  Lab Results  Component Value Date   HGBA1C 8.4 01/13/2017      Assessment & Plan:  Insulin-requiring type 2 DM, with polyneuropathy: The pattern of his cbg's indicates he has reached max benefit of qd insulin.    Patient Instructions  Please decrease the Tresiba to 60 units each morning, and:  Add "humalog," 15 units daily, with your biggest meal.   I have sent a prescription to your pharmacy.   continue the same diabetes pills.  On this type of insulin schedule, you should eat meals on a regular schedule.  If a meal is missed or significantly delayed, your blood sugar could go low.   check your blood sugar once a day.  vary the time of day when you check, between before the 3 meals, and at bedtime.  also check if you have symptoms of your blood sugar being too high or too low.  please keep a record of the readings and bring it to your next appointment here (or you  can bring the meter itself).  You can write it on any piece of paper.  please call us sooner if your blood sugar goes below 70, or if you have a lot of readings over 200.  With the [place], if you miss the insulin, you can still take the full amount 12 hours later.   Please come back for a follow-up appointment in 2 months.

## 2017-01-13 NOTE — Patient Instructions (Addendum)
Please decrease the Tresiba to 60 units each morning, and:  Add "humalog," 15 units daily, with your biggest meal.   I have sent a prescription to your pharmacy.   continue the same diabetes pills.  On this type of insulin schedule, you should eat meals on a regular schedule.  If a meal is missed or significantly delayed, your blood sugar could go low.   check your blood sugar once a day.  vary the time of day when you check, between before the 3 meals, and at bedtime.  also check if you have symptoms of your blood sugar being too high or too low.  please keep a record of the readings and bring it to your next appointment here (or you can bring the meter itself).  You can write it on any piece of paper.  please call us sooner if your blood sugar goes below 70, or if you have a lot of readings over 200.  With the [place], if you miss the insulin, you can still take the full amount 12 hours later.   Please come back for a follow-up appointment in 2 months.

## 2017-01-16 ENCOUNTER — Ambulatory Visit: Payer: 59 | Admitting: Physical Therapy

## 2017-01-16 ENCOUNTER — Encounter: Payer: Self-pay | Admitting: Physical Therapy

## 2017-01-16 DIAGNOSIS — M25671 Stiffness of right ankle, not elsewhere classified: Secondary | ICD-10-CM

## 2017-01-16 DIAGNOSIS — R600 Localized edema: Secondary | ICD-10-CM | POA: Diagnosis not present

## 2017-01-16 DIAGNOSIS — R262 Difficulty in walking, not elsewhere classified: Secondary | ICD-10-CM | POA: Diagnosis not present

## 2017-01-16 DIAGNOSIS — M25571 Pain in right ankle and joints of right foot: Secondary | ICD-10-CM | POA: Diagnosis not present

## 2017-01-16 DIAGNOSIS — M6281 Muscle weakness (generalized): Secondary | ICD-10-CM | POA: Diagnosis not present

## 2017-01-16 NOTE — Therapy (Signed)
[facility] [address] [place], [place], [address] Phone: [phone]   Fax:  [phone]  Physical Therapy Treatment  Patient Details  Name: Bridget [name] MRN: [id number] Date of Birth: [birth year] Referring Provider: Eula Listen, MD  Encounter Date: 01/16/2017      PT End of Session - 01/16/17 1502    Visit Number 7   Number of Visits 13   Date for PT Re-Evaluation 02/03/17   PT Start Time 1502   PT Stop Time 1557   PT Time Calculation (min) 55 min   Activity Tolerance Patient tolerated treatment well   Behavior During Therapy [facility] for tasks assessed/performed      Past Medical History:  Diagnosis Date  . Bacterial vaginosis 12/07/2010  . Depression   . Diabetes mellitus without complication (HCC)   . Diverticulosis   . DIVERTICULOSIS, COLON 10/26/2006  . Fibromyalgia   . GERD (gastroesophageal reflux disease)   . Glaucoma   . Headache   . HEEL SPUR 09/10/2007   Qualifier: Diagnosis of  By: Lanier Prude  MD, Cathrine Muster    . Hypertension   . MALAISE AND FATIGUE 05/27/2010  . Obesity   . OBESITY, NOS 10/26/2006  . Osteoarthritis   . OSTEOARTHRITIS, KNEE 04/27/2010  . Retained tampon 05/21/2012  . SHOULDER PAIN, LEFT 05/19/2010   Qualifier: Diagnosis of  By: Clotilde Dieter MD, Amber    . Vaginal yeast infection 12/08/2010    Past Surgical History:  Procedure Laterality Date  . ABDOMINAL HYSTERECTOMY  1990  . ABDOMINAL HYSTERECTOMY    . BLADDER SURGERY  2001  . CATARACT EXTRACTION Right 2001   with implant  . EYE SURGERY     implant rt eye  . ROTATOR CUFF REPAIR Right   . SHOULDER ARTHROSCOPY WITH SUBACROMIAL DECOMPRESSION Left 07/20/2015   Procedure: SHOULDER ARTHROSCOPY WITH DEBRIDEMENT OF ROTATOR CUFF AND SUBACROMIAL DECOMPRESSION ;  Surgeon: Jones Broom, MD;  Location: [facility];  Service: Orthopedics;  Laterality: Left;  Left shoulder arthroscopy with debridement of rotator cuff and subacromial  decompression    There were no vitals filed for this visit.      Subjective Assessment - 01/16/17 1502    Subjective Pt reports her hip is bothering her, had to take medication. Hip pain 4/10   Patient Stated Goals return to work, walk   Currently in Pain? Yes   Pain Score --  "not too bad"   Pain Location Ankle   Pain Orientation Right   Pain Descriptors / Indicators Spasm                         [facility] Adult PT Treatment/Exercise - 01/16/17 0001      Knee/Hip Exercises: Aerobic   Nustep 5 min L4     Modalities   Modalities Cryotherapy     Cryotherapy   Number Minutes Cryotherapy 10 Minutes   Cryotherapy Location Ankle  R   Type of Cryotherapy Ice pack     Ankle Exercises: Seated   Ankle Circles/Pumps 20 reps  long sitting   Towel Crunch Other (comment)  towel scrunch with toes   Towel Inversion/Eversion Other (comment)  windshield wipers on towel   Marble Pickup 15 marbles   Toe Raise Other (comment)  heel toe raises   Other Seated Ankle Exercises eversion + DF, yellow tband     Ankle Exercises: Standing   Heel Raises 15 reps  R leg only   Other  Standing Ankle Exercises weight shift lat & A/P                PT Education - 01/16/17 1551    Education provided Yes   Education Details exercise form/rationale, gently scrubbing peeling skin & moisturizing, weening from brace   Person(s) Educated Patient   Methods Explanation;Demonstration;Tactile cues;Verbal cues   Comprehension Verbalized understanding;Returned demonstration;Verbal cues required;Tactile cues required;Need further instruction          PT Short Term Goals - 12/22/16 1413      PT SHORT TERM GOAL #1   Title Ankle PROM in dorsiflexion to at least 6 deg by 5/18   Baseline 0 at eval   Time 3   Period Weeks   Status New     PT SHORT TERM GOAL #2   Title Pt will demo proper heel toe through stance phase and toe roll off and swing through wearing tennis shoes and  moderate use of crutches   Baseline max use of crutches and has not been putting weight in boot   Time 3   Period Weeks   Status New     PT SHORT TERM GOAL #3   Title .     PT SHORT TERM GOAL #4   Title .           PT Long Term Goals - 12/22/16 1415      PT LONG TERM GOAL #1   Title Pt will be able to ambulate to walk dogs for at least 15 min, pain <=4/10 by 6/22   Baseline unable at eval   Time 8   Period Weeks   Status New     PT LONG TERM GOAL #2   Title 5/5 strength in hip, knee and ankle to provide proper support to LE biomechanical chain   Baseline grossly 3/5  in hip and knee, 3-/5 at ankle   Time 8   Period Weeks   Status New     PT LONG TERM GOAL #3   Title Pt will be able to play with grand children with minimal disturbance from ankle   Baseline severe disturbance at eval   Time 8   Period Weeks   Status New     PT LONG TERM GOAL #4   Title FOTO to 46% ability to indicate significant improvement in functional ability   Baseline 1% ability at eval   Time 8   Period Weeks   Status New     PT LONG TERM GOAL #5   Title .               Plan - 01/16/17 1549    Clinical Impression Statement Pain noted along 4th metatarsal that wraps around lateral ankle. Discussed suspicions of ankle sprain with injury and time expected for healing. Discussed time out of brace due to forming pressure sores on lateral foot. Pt with good mobility and minimal swelling but is fearful of placing weight through ankle unprotected.    PT Next Visit Plan progress to standing as tolerated   PT Home Exercise Plan ankle ABCs, gastroc stretch, towel scrunches, toe yoga, ice 3/day 15 min; work- heel/toe raises, resisted PF, gastroc stretch, LAQ with ankle pumps, plantar fascia rolling;    Consulted and Agree with Plan of Care Patient      Patient will benefit from skilled therapeutic intervention in order to improve the following deficits and impairments:     Visit  Diagnosis: Pain in right ankle  and joints of right foot  Stiffness of right ankle, not elsewhere classified  Localized edema  Difficulty in walking, not elsewhere classified     Problem List Patient Active Problem List   Diagnosis Date Noted  . Plantar fasciitis 10/10/2016  . Foot pain, bilateral 09/09/2016  . Paresthesia 09/09/2016  . GERD (gastroesophageal reflux disease) 09/09/2014  . Hyperlipidemia 05/24/2013  . Lung nodule seen on imaging study 09/21/2012  . Chronic pain of multiple sites - Knees, Ankles, Back 11/09/2011  . Lupus (systemic lupus erythematosus) (HCC) 06/28/2011  . HYPOTHYROIDISM 04/27/2010  . Depression 04/27/2010  . OSTEOARTHRITIS, KNEE 04/27/2010  . Diabetes mellitus type 2, uncontrolled, with complications (HCC) 08/14/2007  . Obesity 10/26/2006  . HYPERTENSION, BENIGN SYSTEMIC 10/26/2006  . VENOUS INSUFFICIENCY, CHRONIC 10/26/2006    Denene Alamillo C. Mariyam Remington PT, DPT 01/16/17 4:30 PM   [facility] [address] [place], [place], [address] Phone: [phone]   Fax:  [phone]  Name: Bridget [name] MRN: [id number] Date of Birth: 1960/09/17

## 2017-01-18 ENCOUNTER — Encounter: Payer: Self-pay | Admitting: Physical Therapy

## 2017-01-18 ENCOUNTER — Ambulatory Visit: Payer: 59 | Admitting: Physical Therapy

## 2017-01-18 DIAGNOSIS — R262 Difficulty in walking, not elsewhere classified: Secondary | ICD-10-CM

## 2017-01-18 DIAGNOSIS — R600 Localized edema: Secondary | ICD-10-CM

## 2017-01-18 DIAGNOSIS — M25571 Pain in right ankle and joints of right foot: Secondary | ICD-10-CM | POA: Diagnosis not present

## 2017-01-18 DIAGNOSIS — M25671 Stiffness of right ankle, not elsewhere classified: Secondary | ICD-10-CM

## 2017-01-18 DIAGNOSIS — M6281 Muscle weakness (generalized): Secondary | ICD-10-CM

## 2017-01-18 MED FILL — COMBIGAN EYE DROPS: 0.2-0.5 | 50 days supply | Qty: 5 | Fill #0

## 2017-01-18 NOTE — Therapy (Addendum)
[facility] [address] [place], [place], [address] Phone: [phone]   Fax:  [phone]  Physical Therapy Treatment  Patient Details  Name: Bridget [name] MRN: [id number] Date of Birth: 24-Oct-1960 Referring Provider: Eula Listen, MD  Encounter Date: 01/18/2017      PT End of Session - 01/18/17 1503    Visit Number 8   Number of Visits 13   Date for PT Re-Evaluation 02/03/17   Authorization Type [facility] UMR   PT Start Time 1459   PT Stop Time 1555   PT Time Calculation (min) 56 min   Activity Tolerance Patient tolerated treatment well   Behavior During Therapy [facility] for tasks assessed/performed      Past Medical History:  Diagnosis Date  . Bacterial vaginosis 12/07/2010  . Depression   . Diabetes mellitus without complication (HCC)   . Diverticulosis   . DIVERTICULOSIS, COLON 10/26/2006  . Fibromyalgia   . GERD (gastroesophageal reflux disease)   . Glaucoma   . Headache   . HEEL SPUR 09/10/2007   Qualifier: Diagnosis of  By: Lanier Prude  MD, Cathrine Muster    . Hypertension   . MALAISE AND FATIGUE 05/27/2010  . Obesity   . OBESITY, NOS 10/26/2006  . Osteoarthritis   . OSTEOARTHRITIS, KNEE 04/27/2010  . Retained tampon 05/21/2012  . SHOULDER PAIN, LEFT 05/19/2010   Qualifier: Diagnosis of  By: Clotilde Dieter MD, Amber    . Vaginal yeast infection 12/08/2010    Past Surgical History:  Procedure Laterality Date  . ABDOMINAL HYSTERECTOMY  1990  . ABDOMINAL HYSTERECTOMY    . BLADDER SURGERY  2001  . CATARACT EXTRACTION Right 2001   with implant  . EYE SURGERY     implant rt eye  . ROTATOR CUFF REPAIR Right   . SHOULDER ARTHROSCOPY WITH SUBACROMIAL DECOMPRESSION Left 07/20/2015   Procedure: SHOULDER ARTHROSCOPY WITH DEBRIDEMENT OF ROTATOR CUFF AND SUBACROMIAL DECOMPRESSION ;  Surgeon: Jones Broom, MD;  Location: [facility];  Service: Orthopedics;  Laterality: Left;  Left shoulder arthroscopy with debridement of  rotator cuff and subacromial decompression    There were no vitals filed for this visit.      Subjective Assessment - 01/18/17 1503    Subjective Tender to stand after being seated. Not using cane any more to decrease dependency. Still getting muscle spasms- burning, sharp pain. Throbbing has eased a bit. Exercises help to reduce pain and muscle spasms.    Patient Stated Goals return to work, walk   Pain Score 4    Pain Location Foot   Pain Orientation Right;Lateral   Pain Radiating Towards lateral ankle   Aggravating Factors  weight after sitting            [facility] PT Assessment - 01/18/17 0001      Observation/Other Assessments   Focus on Therapeutic Outcomes (FOTO)  48% ability     AROM   Right Ankle Dorsiflexion 4  passive 6   Right Ankle Plantar Flexion 50     Strength   Right Ankle Dorsiflexion 4+/5   Right Ankle Inversion 5/5   Right Ankle Eversion 5/5                     [facility] Adult PT Treatment/Exercise - 01/18/17 0001      Knee/Hip Exercises: Aerobic   Nustep 7 min L4     Knee/Hip Exercises: Standing   Gait Training toe off, retro stepping & weight acceptance'  Vasopneumatic   Number Minutes Vasopneumatic  15 minutes   Vasopnuematic Location  Ankle   Vasopneumatic Pressure Low   Vasopneumatic Temperature  34 deg     Ankle Exercises: Sidelying   Ankle Eversion 20 reps;10 reps     Ankle Exercises: Stretches   Slant Board Stretch 2 reps;30 seconds     Ankle Exercises: Seated   Other Seated Ankle Exercises ankle clocks on dynadisk     Ankle Exercises: Standing   SLS on green airex   Other Standing Ankle Exercises weight acceptance onto airex                PT Education - 01/18/17 1506    Education provided Yes   Education Details exercise form/rationale, use of brace, progress toward goals   Person(s) Educated Patient   Methods Explanation;Demonstration;Tactile cues;Verbal cues   Comprehension Verbalized  understanding;Returned demonstration;Verbal cues required;Tactile cues required;Need further instruction          PT Short Term Goals - 01/18/17 1508      PT SHORT TERM GOAL #1   Title Ankle PROM in dorsiflexion to at least 6 deg by 5/18   Baseline 6   Status Achieved     PT SHORT TERM GOAL #2   Title Pt will demo proper heel toe through stance phase and toe roll off and swing through wearing tennis shoes and moderate use of crutches   Baseline able with ASO and no AD   Status Achieved           PT Long Term Goals - 12/22/16 1415      PT LONG TERM GOAL #1   Title Pt will be able to ambulate to walk dogs for at least 15 min, pain <=4/10 by 6/22   Baseline unable at eval   Time 8   Period Weeks   Status New     PT LONG TERM GOAL #2   Title 5/5 strength in hip, knee and ankle to provide proper support to LE biomechanical chain   Baseline grossly 3/5  in hip and knee, 3-/5 at ankle   Time 8   Period Weeks   Status New     PT LONG TERM GOAL #3   Title Pt will be able to play with grand children with minimal disturbance from ankle   Baseline severe disturbance at eval   Time 8   Period Weeks   Status New     PT LONG TERM GOAL #4   Title FOTO to 46% ability to indicate significant improvement in functional ability   Baseline 1% ability at eval   Time 8   Period Weeks   Status New     PT LONG TERM GOAL #5   Title .               Plan - 01/18/17 1542    Clinical Impression Statement Walking and balance challenges without brace today to improve weight acceptance. Pt tends to place weight along lateral foot due to eversion fatigue and increased laxity at lateral maleolus ligaments. Pt reported soreness in exercises but denied increase in pain. Notable improvements in ROM and strength today.    PT Next Visit Plan standing exercises without brace   PT Home Exercise Plan ankle ABCs, gastroc stretch, towel scrunches, toe yoga, ice 3/day 15 min; work- heel/toe  raises, resisted PF, gastroc stretch, LAQ with ankle pumps, plantar fascia rolling;    Consulted and Agree with Plan of Care Patient  Patient will benefit from skilled therapeutic intervention in order to improve the following deficits and impairments:     Visit Diagnosis: Pain in right ankle and joints of right foot  Stiffness of right ankle, not elsewhere classified  Localized edema  Difficulty in walking, not elsewhere classified  Muscle weakness (generalized)     Problem List Patient Active Problem List   Diagnosis Date Noted  . Plantar fasciitis 10/10/2016  . Foot pain, bilateral 09/09/2016  . Paresthesia 09/09/2016  . GERD (gastroesophageal reflux disease) 09/09/2014  . Hyperlipidemia 05/24/2013  . Lung nodule seen on imaging study 09/21/2012  . Chronic pain of multiple sites - Knees, Ankles, Back 11/09/2011  . Lupus (systemic lupus erythematosus) (HCC) 06/28/2011  . HYPOTHYROIDISM 04/27/2010  . Depression 04/27/2010  . OSTEOARTHRITIS, KNEE 04/27/2010  . Diabetes mellitus type 2, uncontrolled, with complications (HCC) 08/14/2007  . Obesity 10/26/2006  . HYPERTENSION, BENIGN SYSTEMIC 10/26/2006  . VENOUS INSUFFICIENCY, CHRONIC 10/26/2006    Linde Wilensky C. Ashlin Kreps PT, DPT 01/18/17 3:52 PM   [facility] [address] [place], [place], [address] Phone: [phone]   Fax:  [phone]  Name: DOCIE ABRAMOVICH MRN: [id number] Date of Birth: 1960-10-02

## 2017-01-24 ENCOUNTER — Ambulatory Visit: Payer: 59 | Admitting: Physical Therapy

## 2017-01-24 DIAGNOSIS — R600 Localized edema: Secondary | ICD-10-CM | POA: Diagnosis not present

## 2017-01-24 DIAGNOSIS — R262 Difficulty in walking, not elsewhere classified: Secondary | ICD-10-CM

## 2017-01-24 DIAGNOSIS — M25571 Pain in right ankle and joints of right foot: Secondary | ICD-10-CM | POA: Diagnosis not present

## 2017-01-24 DIAGNOSIS — M25671 Stiffness of right ankle, not elsewhere classified: Secondary | ICD-10-CM | POA: Diagnosis not present

## 2017-01-24 DIAGNOSIS — M6281 Muscle weakness (generalized): Secondary | ICD-10-CM

## 2017-01-24 MED FILL — MELOXICAM 15 MG TABLET: 15 | 30 days supply | Qty: 30 | Fill #0

## 2017-01-24 NOTE — Therapy (Signed)
[place], [place], [address] Phone: [phone]   Fax:  [phone]  Physical Therapy Treatment  Patient Details  Name: ADDLEY BALLINGER MRN: [id number] Date of Birth: 1960/10/28 Referring Provider: Rhina Brackett, MD  Encounter Date: 01/24/2017      PT End of Session - 01/24/17 1502    Visit Number 9   Number of Visits 13   Date for PT Re-Evaluation 02/03/17   Authorization Type [facility] UMR   PT Start Time 0300   PT Stop Time 0348   PT Time Calculation (min) 48 min      Past Medical History:  Diagnosis Date  . Bacterial vaginosis 12/07/2010  . Depression   . Diabetes mellitus without complication ([place])   . Diverticulosis   . DIVERTICULOSIS, COLON 10/26/2006  . Fibromyalgia   . GERD (gastroesophageal reflux disease)   . Glaucoma   . Headache   . HEEL SPUR 09/10/2007   Qualifier: Diagnosis of  By: Drue Flirt  MD, Merrily Brittle    . Hypertension   . MALAISE AND FATIGUE 05/27/2010  . Obesity   . OBESITY, NOS 10/26/2006  . Osteoarthritis   . OSTEOARTHRITIS, KNEE 04/27/2010  . Retained tampon 05/21/2012  . SHOULDER PAIN, LEFT 05/19/2010   Qualifier: Diagnosis of  By: Annamary Carolin MD, Amber    . Vaginal yeast infection 12/08/2010    Past Surgical History:  Procedure Laterality Date  . ABDOMINAL HYSTERECTOMY  1990  . ABDOMINAL HYSTERECTOMY    . BLADDER SURGERY  2001  . CATARACT EXTRACTION Right 2001   with implant  . EYE SURGERY     implant rt eye  . ROTATOR CUFF REPAIR Right   . SHOULDER ARTHROSCOPY WITH SUBACROMIAL DECOMPRESSION Left 07/20/2015   Procedure: SHOULDER ARTHROSCOPY WITH DEBRIDEMENT OF ROTATOR CUFF AND SUBACROMIAL DECOMPRESSION ;  Surgeon: Tania Ade, MD;  Location: [facility];  Service: Orthopedics;  Laterality: Left;  Left shoulder arthroscopy with debridement of rotator cuff and subacromial decompression    There were no vitals filed for this visit.      Subjective  Assessment - 01/24/17 1632    Subjective MD released me.             [facility] PT Assessment - 01/24/17 0001      AROM   Right Ankle Dorsiflexion 4  passive 6   Right Ankle Plantar Flexion 50     Strength   Strength Assessment Site Knee   Right/Left Hip --  bilateral hip flexion 4-/5    Right Hip ABduction 4+/5   Right/Left Knee Right   Right Knee Flexion 5/5   Right Knee Extension 5/5   Right Ankle Dorsiflexion 5/5   Right Ankle Plantar Flexion --  0/25   Right Ankle Inversion 5/5   Right Ankle Eversion 5/5                     [facility] Adult PT Treatment/Exercise - 01/24/17 0001      Ambulation/Gait   Stairs Yes   Stairs Assistance 6: Modified independent (Device/Increase time)   Stair Management Technique Alternating pattern   Number of Stairs 12   Height of Stairs 6     Knee/Hip Exercises: Stretches   Gastroc Stretch 3 reps;30 seconds   Gastroc Stretch Limitations runners stretch     Knee/Hip Exercises: Aerobic   Nustep 6 min L5      Knee/Hip Exercises: Standing   Heel Raises 10 reps   Heel  Raises Limitations up with 2 down on right    SLS 7 sec best left, 8 sec best right    Other Standing Knee Exercises tandem stance 21 sec,      Vasopneumatic   Number Minutes Vasopneumatic  15 minutes   Vasopnuematic Location  Ankle   Vasopneumatic Pressure Low   Vasopneumatic Temperature  32                  PT Short Term Goals - 01/18/17 1508      PT SHORT TERM GOAL #1   Title Ankle PROM in dorsiflexion to at least 6 deg by 5/18   Baseline 6   Status Achieved     PT SHORT TERM GOAL #2   Title Pt will demo proper heel toe through stance phase and toe roll off and swing through wearing tennis shoes and moderate use of crutches   Baseline able with ASO and no AD   Status Achieved           PT Long Term Goals - 01/24/17 1503      PT LONG TERM GOAL #1   Title Pt will be able to ambulate to walk dogs for at least 15 min, pain <=4/10 by  6/22   Baseline 10 minutes on level surface   Time 8   Period Weeks   Status On-going     PT LONG TERM GOAL #2   Title 5/5 strength in hip, knee and ankle to provide proper support to LE biomechanical chain   Baseline hip 4-/5, ankle 5/5 execept PF is 2+/5   Time 8   Period Weeks   Status Partially Met     PT LONG TERM GOAL #3   Title Pt will be able to play with grand children with minimal disturbance from ankle   Time 8   Period Weeks   Status Achieved     PT LONG TERM GOAL #4   Title FOTO to 46% ability to indicate significant improvement in functional ability   Baseline 48% ability    Time 8   Period Weeks   Status Achieved               Plan - 01/24/17 1516    Clinical Impression Statement Pt enters wtih ankle air cast. She saw MD today who released her. She was prescribed an anti inflammatory for swelling. Her ankle strength is 5/5 except PF which is 2+/5. She is unable to lift into single heel raise. She is unable to lift much off floor into a left single heel raise. Her SLS is 8 sec best Rt and 7 sec best Lt. SHe can navigate stairs alternating pattern un and down without increased pain. She can walk her dog for 10 minutes on level surface. She can play with grandchildren without limitiation. LTG# 2 partially met, LTG# 3, #4 met.    PT Next Visit Plan standing exercises without brace   PT Home Exercise Plan ankle ABCs, gastroc stretch, towel scrunches, toe yoga, ice 3/day 15 min; work- heel/toe raises, resisted PF, gastroc stretch, LAQ with ankle pumps, plantar fascia rolling;    Consulted and Agree with Plan of Care Patient   Family Member Consulted daughter      Patient will benefit from skilled therapeutic intervention in order to improve the following deficits and impairments:  Abnormal gait, Decreased range of motion, Difficulty walking, Increased muscle spasms, Decreased activity tolerance, Pain, Improper body mechanics, Impaired flexibility, Hypomobility,  Decreased balance,  Decreased mobility, Decreased strength, Increased edema, Postural dysfunction  Visit Diagnosis: Pain in right ankle and joints of right foot  Stiffness of right ankle, not elsewhere classified  Localized edema  Difficulty in walking, not elsewhere classified  Muscle weakness (generalized)     Problem List Patient Active Problem List   Diagnosis Date Noted  . Plantar fasciitis 10/10/2016  . Foot pain, bilateral 09/09/2016  . Paresthesia 09/09/2016  . GERD (gastroesophageal reflux disease) 09/09/2014  . Hyperlipidemia 05/24/2013  . Lung nodule seen on imaging study 09/21/2012  . Chronic pain of multiple sites - Knees, Ankles, Back 11/09/2011  . Lupus (systemic lupus erythematosus) ([place]) 06/28/2011  . HYPOTHYROIDISM 04/27/2010  . Depression 04/27/2010  . OSTEOARTHRITIS, KNEE 04/27/2010  . Diabetes mellitus type 2, uncontrolled, with complications ([place]) 99/35/7017  . Obesity 10/26/2006  . HYPERTENSION, BENIGN SYSTEMIC 10/26/2006  . VENOUS INSUFFICIENCY, CHRONIC 10/26/2006    Dorene Ar, PTA 01/24/2017, 4:34 PM  [facility] [address] [place], [place], [address] Phone: [phone]   Fax:  [phone]  Name: NURIYA STUCK MRN: [id number] Date of Birth: 06-03-1961

## 2017-01-26 ENCOUNTER — Ambulatory Visit: Payer: 59 | Admitting: Physical Therapy

## 2017-01-26 ENCOUNTER — Encounter: Payer: Self-pay | Admitting: Physical Therapy

## 2017-01-26 DIAGNOSIS — M6281 Muscle weakness (generalized): Secondary | ICD-10-CM

## 2017-01-26 DIAGNOSIS — R600 Localized edema: Secondary | ICD-10-CM

## 2017-01-26 DIAGNOSIS — R262 Difficulty in walking, not elsewhere classified: Secondary | ICD-10-CM

## 2017-01-26 DIAGNOSIS — M25571 Pain in right ankle and joints of right foot: Secondary | ICD-10-CM | POA: Diagnosis not present

## 2017-01-26 DIAGNOSIS — M25671 Stiffness of right ankle, not elsewhere classified: Secondary | ICD-10-CM

## 2017-01-26 NOTE — Therapy (Signed)
[place], [place], [address] Phone: [phone]   Fax:  [phone]  Physical Therapy Treatment/Discharge Summary  Patient Details  Name: Bridget [name] MRN: [id number] Date of Birth: 29-May-1961 Referring Provider: Rhina Brackett, MD  Encounter Date: 01/26/2017      PT End of Session - 01/26/17 1545    Visit Number 10   Number of Visits 13   Date for PT Re-Evaluation 02/03/17   Authorization Type [facility] U[facility]   PT Start Time 3845   PT Stop Time 1610  pt chose to end session   PT Time Calculation (min) 25 min   Activity Tolerance Patient tolerated treatment well   Behavior During Therapy [facility] for tasks assessed/performed      Past Medical History:  Diagnosis Date  . Bacterial vaginosis 12/07/2010  . Depression   . Diabetes mellitus without complication ([place])   . Diverticulosis   . DIVERTICULOSIS, COLON 10/26/2006  . Fibromyalgia   . GERD (gastroesophageal reflux disease)   . Glaucoma   . Headache   . HEEL SPUR 09/10/2007   Qualifier: Diagnosis of  By: Drue Flirt  MD, Merrily Brittle    . Hypertension   . MALAISE AND FATIGUE 05/27/2010  . Obesity   . OBESITY, NOS 10/26/2006  . Osteoarthritis   . OSTEOARTHRITIS, KNEE 04/27/2010  . Retained tampon 05/21/2012  . SHOULDER PAIN, LEFT 05/19/2010   Qualifier: Diagnosis of  By: Annamary Carolin MD, Amber    . Vaginal yeast infection 12/08/2010    Past Surgical History:  Procedure Laterality Date  . ABDOMINAL HYSTERECTOMY  1990  . ABDOMINAL HYSTERECTOMY    . BLADDER SURGERY  2001  . CATARACT EXTRACTION Right 2001   with implant  . EYE SURGERY     implant rt eye  . ROTATOR CUFF REPAIR Right   . SHOULDER ARTHROSCOPY WITH SUBACROMIAL DECOMPRESSION Left 07/20/2015   Procedure: SHOULDER ARTHROSCOPY WITH DEBRIDEMENT OF ROTATOR CUFF AND SUBACROMIAL DECOMPRESSION ;  Surgeon: Tania Ade, MD;  Location: [place];  Service: Orthopedics;  Laterality: Left;   Left shoulder arthroscopy with debridement of rotator cuff and subacromial decompression    There were no vitals filed for this visit.      Subjective Assessment - 01/26/17 1550    Subjective Hip is really bothering her today. Has not had a chance to take new anti inflammatory yet. Significant difficulty sleeping.                                  PT Education - 01/26/17 1612    Education provided Yes   Education Details sleeping in air cast, avoiding hip hiking when walking   Person(s) Educated Patient   Methods Explanation   Comprehension Verbalized understanding          PT Short Term Goals - 01/18/17 1508      PT SHORT TERM GOAL #1   Title Ankle PROM in dorsiflexion to at least 6 deg by 5/18   Baseline 6   Status Achieved     PT SHORT TERM GOAL #2   Title Pt will demo proper heel toe through stance phase and toe roll off and swing through wearing tennis shoes and moderate use of crutches   Baseline able with ASO and no AD   Status Achieved           PT Long Term Goals - 01/26/17 1601  PT LONG TERM GOAL #1   Title Pt will be able to ambulate to walk dogs for at least 15 min, pain <=4/10 by 6/22   Baseline 10 minutes on level surface   Status Not Met     PT LONG TERM GOAL #2   Title 5/5 strength in hip, knee and ankle to provide proper support to LE biomechanical chain   Baseline hip 4-/5, ankle 5/5 execept PF is 2+/5   Status Partially Met     PT LONG TERM GOAL #3   Title Pt will be able to play with grand children with minimal disturbance from ankle   Status Achieved     PT LONG TERM GOAL #4   Title FOTO to 46% ability to indicate significant improvement in functional ability   Baseline 48% ability    Status Achieved               Plan - 01/26/17 1613    Clinical Impression Statement Pt has chosen to D/C from PT at this time. Is still having pain in her ankle and hip but would like to try independent program for  now. Pt was instructed to contact us with any further questions.    PT Treatment/Interventions ADLs/Self Care Home Management;Cryotherapy;Electrical Stimulation;Iontophoresis 88m/ml Dexamethasone;Functional mobility training;Stair training;Gait training;Ultrasound;Traction;Moist Heat;Therapeutic activities;Therapeutic exercise;Balance training;Neuromuscular re-education;Patient/family education;Passive range of motion;Manual techniques;Dry needling;Taping   Consulted and Agree with Plan of Care Patient      Patient will benefit from skilled therapeutic intervention in order to improve the following deficits and impairments:  Abnormal gait, Decreased range of motion, Difficulty walking, Increased muscle spasms, Decreased activity tolerance, Pain, Improper body mechanics, Impaired flexibility, Hypomobility, Decreased balance, Decreased mobility, Decreased strength, Increased edema, Postural dysfunction  Visit Diagnosis: Pain in right ankle and joints of right foot  Stiffness of right ankle, not elsewhere classified  Localized edema  Difficulty in walking, not elsewhere classified  Muscle weakness (generalized)     Problem List Patient Active Problem List   Diagnosis Date Noted  . Plantar fasciitis 10/10/2016  . Foot pain, bilateral 09/09/2016  . Paresthesia 09/09/2016  . GERD (gastroesophageal reflux disease) 09/09/2014  . Hyperlipidemia 05/24/2013  . Lung nodule seen on imaging study 09/21/2012  . Chronic pain of multiple sites - Knees, Ankles, Back 11/09/2011  . Lupus (systemic lupus erythematosus) ([place] 06/28/2011  . HYPOTHYROIDISM 04/27/2010  . Depression 04/27/2010  . OSTEOARTHRITIS, KNEE 04/27/2010  . Diabetes mellitus type 2, uncontrolled, with complications ([place] 103/07/8117 . Obesity 10/26/2006  . HYPERTENSION, BENIGN SYSTEMIC 10/26/2006  . VENOUS INSUFFICIENCY, CHRONIC 10/26/2006    PHYSICAL THERAPY DISCHARGE SUMMARY  Visits from Start of Care: 10  Current  functional level related to goals / functional outcomes: See above   Remaining deficits: See above   Education / Equipment: Anatomy of condition, POC, HEP, exercise form/rationale  Plan: Patient agrees to discharge.  Patient goals were partially met. Patient is being discharged due to being pleased with the current functional level.  ?????     Candid Bovey C. Obadiah Dennard PT, DPT 01/26/17 4:17 PM     [facility]GH[place] [address]Phone: [phone]  Fax:  [phone] Name: PDELYLA SANDEENMRN: [id number] of Birth: 209-Nov-1962

## 2017-01-30 ENCOUNTER — Ambulatory Visit: Payer: 59 | Admitting: Physical Therapy

## 2017-01-30 MED FILL — LYRICA 150 MG CAPSULE: 150 | 30 days supply | Qty: 60 | Fill #1

## 2017-01-30 MED FILL — TRESIBA FLEXTOUCH 100 UNITS: 100 | 21 days supply | Qty: 15 | Fill #3

## 2017-02-01 ENCOUNTER — Encounter: Payer: 59 | Admitting: Physical Therapy

## 2017-02-15 MED FILL — LISINOPRIL 5 MG TABLET: 5 | 30 days supply | Qty: 30 | Fill #1

## 2017-02-15 MED FILL — BROMOCRIPTINE 2.5 MG TABLET: 2.5 | 30 days supply | Qty: 15 | Fill #10

## 2017-02-24 MED FILL — MELOXICAM 15 MG TABLET: 15 | 30 days supply | Qty: 30 | Fill #1

## 2017-02-27 MED FILL — TRESIBA FLEXTOUCH 100 UNITS: 100 | 21 days supply | Qty: 15 | Fill #4

## 2017-02-27 MED FILL — SM ALCOHOL 70% PREP PADS: 25 days supply | Qty: 100 | Fill #2

## 2017-03-12 MED FILL — UNIFINE PENTIPS 31GX3/16": 31G X 5 MM | 90 days supply | Qty: 100 | Fill #3

## 2017-03-12 MED FILL — UNIFINE PENTIPS 31GX3/16: 31G X 5 MM | 90 days supply | Qty: 100 | Fill #3

## 2017-03-14 ENCOUNTER — Other Ambulatory Visit: Payer: Self-pay | Admitting: Endocrinology

## 2017-03-14 MED FILL — BROMOCRIPTINE 2.5 MG TABLET: 2.5 | 30 days supply | Qty: 15 | Fill #0

## 2017-03-17 ENCOUNTER — Ambulatory Visit: Payer: 59 | Admitting: Endocrinology

## 2017-03-20 ENCOUNTER — Encounter: Payer: Self-pay | Admitting: Endocrinology

## 2017-03-20 ENCOUNTER — Ambulatory Visit (INDEPENDENT_AMBULATORY_CARE_PROVIDER_SITE_OTHER): Payer: 59 | Admitting: Endocrinology

## 2017-03-20 VITALS — BP 140/88 | HR 81 | Wt 260.8 lb

## 2017-03-20 DIAGNOSIS — Z794 Long term (current) use of insulin: Secondary | ICD-10-CM

## 2017-03-20 DIAGNOSIS — E1165 Type 2 diabetes mellitus with hyperglycemia: Secondary | ICD-10-CM

## 2017-03-20 DIAGNOSIS — IMO0002 Reserved for concepts with insufficient information to code with codable children: Secondary | ICD-10-CM

## 2017-03-20 DIAGNOSIS — E118 Type 2 diabetes mellitus with unspecified complications: Secondary | ICD-10-CM

## 2017-03-20 LAB — HEMOGLOBIN A1C: Hgb A1c MFr Bld: 7.2 % — ABNORMAL HIGH (ref 4.6–6.5)

## 2017-03-20 NOTE — Progress Notes (Signed)
Subjective:    Patient ID: Bridget [name], female    DOB: 11/30/1960, 56 y.o.   MRN: [id number]  HPI Pt returns for f/u of diabetes mellitus: DM type: Insulin-requiring type 2.  Dx'ed: 6063 Complications: polyneuropathy.   Therapy: insulin + 2 oral meds.   GDM: never.  DKA: never.   Severe hypoglycemia: never.  Pancreatitis: never.  Other: she did not tolerate invokana (vaginitis) or metformin-XR (diarrhea); edema precludes pioglitizone rx.  she took insulin from 2000-2003; she resumed in 2017, She declines multiple daily injections; she works 3rd shift.  Interval history: she brings her meter, with her cbg's which I have reviewed today.  It varies from 84-200.  It is lowest after taking humalog.  She says she seldom misses the insulin.  Past Medical History:  Diagnosis Date  . Bacterial vaginosis 12/07/2010  . Depression   . Diabetes mellitus without complication ([place])   . Diverticulosis   . DIVERTICULOSIS, COLON 10/26/2006  . Fibromyalgia   . GERD (gastroesophageal reflux disease)   . Glaucoma   . Headache   . HEEL SPUR 09/10/2007   Qualifier: Diagnosis of  By: Drue Flirt  MD, Merrily Brittle    . Hypertension   . MALAISE AND FATIGUE 05/27/2010  . Obesity   . OBESITY, NOS 10/26/2006  . Osteoarthritis   . OSTEOARTHRITIS, KNEE 04/27/2010  . Retained tampon 05/21/2012  . SHOULDER PAIN, LEFT 05/19/2010   Qualifier: Diagnosis of  By: Annamary Carolin MD, Amber    . Vaginal yeast infection 12/08/2010    Past Surgical History:  Procedure Laterality Date  . ABDOMINAL HYSTERECTOMY  1990  . ABDOMINAL HYSTERECTOMY    . BLADDER SURGERY  2001  . CATARACT EXTRACTION Right 2001   with implant  . EYE SURGERY     implant rt eye  . ROTATOR CUFF REPAIR Right   . SHOULDER ARTHROSCOPY WITH SUBACROMIAL DECOMPRESSION Left 07/20/2015   Procedure: SHOULDER ARTHROSCOPY WITH DEBRIDEMENT OF ROTATOR CUFF AND SUBACROMIAL DECOMPRESSION ;  Surgeon: Tania Ade, MD;  Location: [place];  Service:  Orthopedics;  Laterality: Left;  Left shoulder arthroscopy with debridement of rotator cuff and subacromial decompression    Social History   Social History  . Marital status: Divorced    Spouse name: N/A  . Number of children: 2  . Years of education: N/A   Occupational History  . PBX operator [employer]   Social History Main Topics  . Smoking status: Former Smoker    Packs/day: 0.30    Types: Cigarettes    Quit date: 08/29/1998  . Smokeless tobacco: Never Used     Comment: Quit in 2000   . Alcohol use Yes     Comment: socially  . Drug use: No  . Sexual activity: Yes    Birth control/ protection: Surgical   Other Topics Concern  . Not on file   Social History Narrative   Works at [employer] as a Museum/gallery conservator    Current Outpatient Prescriptions on File Prior to Visit  Medication Sig Dispense Refill  . Alcohol Swabs (ALCOHOL PREPS) PADS 1 Device by Does not apply route 4 (four) times daily -  before meals and at bedtime. 100 each 11  . amitriptyline (ELAVIL) 25 MG tablet Take 0.5 tablets (12.5 mg total) by mouth at bedtime. 30 tablet 2  . aspirin 81 MG tablet Take 1 tablet (81 mg total) by mouth daily.    . Blood Glucose Monitoring Suppl (ACCU-CHEK GUIDE) w/Device KIT 2 each by  Does not apply route daily. 1 kit 2  . bromocriptine (PARLODEL) 2.5 MG tablet TAKE 1/2 TABLET BY MOUTH ONCE DAILY 15 tablet 11  . cetirizine (ZYRTEC) 10 MG tablet Take 1 tablet (10 mg total) by mouth daily. 30 tablet 11  . colesevelam (WELCHOL) 625 MG tablet Take 2 tablets (1,250 mg total) by mouth 2 (two) times daily with a meal. 60 tablet 11  . Cranberry-Vitamin C-Probiotic (AZO CRANBERRY PO) Take by mouth daily. Reported on 10/13/2015    . cyclobenzaprine (FLEXERIL) 5 MG tablet Take 1 tablet (5 mg total) by mouth 3 (three) times daily as needed for muscle spasms. Needs visit for refills 90 tablet 0  . diclofenac sodium (VOLTAREN) 1 % GEL Apply 2 g topically 4 (four) times daily. Rub into affected  area of foot 2 to 4 times daily 100 g 2  . docusate sodium (COLACE) 100 MG capsule Take 1 capsule (100 mg total) by mouth 3 (three) times daily as needed. 20 capsule 0  . glucose blood (ACCU-CHEK GUIDE) test strip Use to check blood sugar 2 times per day. Dx code: E11.9 200 each 2  . insulin degludec (TRESIBA FLEXTOUCH) 100 UNIT/ML SOPN FlexTouch Pen Inject 0.6 mLs (60 Units total) into the skin daily. And pen needles 1/day 10 pen 11  . insulin lispro (HUMALOG KWIKPEN) 100 UNIT/ML KiwkPen Inject 0.15 mLs (15 Units total) into the skin daily. With the biggest meal, and pen needles 2/day 15 mL 11  . Lancets (ACCU-CHEK MULTICLIX) lancets Use to check blood sugar 2 times per day. Dx code E11.9 200 each 2  . lisinopril (PRINIVIL,ZESTRIL) 5 MG tablet TAKE 1 TABLET BY MOUTH DAILY. 30 tablet 5  . LYRICA 25 MG capsule TAKE 1 CAPSULE BY MOUTH TWICE DAILY 60 capsule 0  . NALTREXONE HCL PO Take 3 mg by mouth daily.    . NONFORMULARY OR COMPOUNDED ITEM Take 1 tablet by mouth at bedtime. Reported on 03/08/2016    . NONFORMULARY OR COMPOUNDED ITEM [facility]: [place]illies Tendonitis Cream - Diclofenac 3%, Baclofen 2%, Bupivacaine 1%, Gabapentin 6%, Ibuprofen 3%, Pentoxifylline 3%, apply 1-2 gram to feet 3-4 times daily. 120 each 0  . ondansetron (ZOFRAN ODT) 8 MG disintegrating tablet Take 1 tablet (8 mg total) by mouth every 8 (eight) hours as needed for nausea or vomiting. 20 tablet 0  . OVER THE COUNTER MEDICATION 1 capsule daily.     . pantoprazole (PROTONIX) 20 MG tablet TAKE 1 TABLET BY MOUTH DAILY. 30 tablet PRN  . pregabalin (LYRICA) 150 MG capsule Take 1 capsule (150 mg total) by mouth 2 (two) times daily. Needs visit for more refill 60 capsule 1  . traMADol (ULTRAM) 50 MG tablet Take 1 tablet (50 mg total) by mouth every 6 (six) hours as needed. 15 tablet 0  . triamcinolone (NASACORT AQ) 55 MCG/ACT AERO nasal inhaler Place 2 sprays into the nose daily. 1 Inhaler 12  . TRUEPLUS LANCETS 28G MISC Use to  help check blood sugars twice a day Dx E11.9 100 each 3  . TRUETEST TEST test strip USE AS INSTRUCTED 100 each 11   No current facility-administered medications on file prior to visit.     Allergies  Allergen Reactions  . Citrus Itching  . Invokana [Canagliflozin] Other (See Comments)    Caused frequent vaginal yeast infections  . Codeine Hives  . Metformin And Related Diarrhea and Nausea And Vomiting  . Metoclopramide Hcl Nausea And Vomiting  . Penicillins Nausea And Vomiting  .  Propoxyphene N-Acetaminophen Nausea And Vomiting  . Sulfonamide Derivatives Hives    Family History  Problem Relation Age of Onset  . Breast cancer Mother   . Diabetes Mother   . Ovarian cancer Maternal Grandmother   . Heart disease Maternal Grandmother        great  . Stomach cancer Cousin   . Diabetes Brother        x 3    BP 140/88   Pulse 81   Wt 260 lb 12.8 oz (118.3 kg)   SpO2 96%   BMI 43.40 kg/m    Review of Systems She denies hypoglycemia    Objective:   Physical Exam VITAL SIGNS:  See vs page GENERAL: no distress Pulses: foot pulses are intact bilaterally.   MSK: no deformity of the feet or ankles.  CV: trace bilat edema of the legs.  Skin:  no ulcer on the feet or ankles.  normal temp on the feet and ankles. There is hyperpigmentation at the right ankle, at the site of recent ankle fx  Neuro: sensation is intact to touch on the feet and ankles.    Lab Results  Component Value Date   HGBA1C 7.2 (H) 03/20/2017       Assessment & Plan:  Insulin-requiring type 2 DM, with polyneuropathy: this is the best control this pt should aim for, given variable cbg's.  Please continue the same medications.

## 2017-03-20 NOTE — Patient Instructions (Addendum)
A diabetes blood test is requested for you today.  We'll let you know about the results. On this type of insulin schedule, you should eat meals on a regular schedule.  If a meal is missed or significantly delayed, your blood sugar could go low.   check your blood sugar once a day.  vary the time of day when you check, between before the 3 meals, and at bedtime.  also check if you have symptoms of your blood sugar being too high or too low.  please keep a record of the readings and bring it to your next appointment here (or you can bring the meter itself).  You can write it on any piece of paper.  please call us sooner if your blood sugar goes below 70, or if you have a lot of readings over 200.  With the [place], if you miss it, you can still take the full amount 12 hours later.   Please come back for a follow-up appointment in 3 months.

## 2017-03-23 MED FILL — LISINOPRIL 5 MG TABLET: 5 | 30 days supply | Qty: 30 | Fill #2

## 2017-03-23 MED FILL — LUMIGAN 0.01% EYE DROPS: 0.01 | 30 days supply | Qty: 3 | Fill #2

## 2017-03-27 MED FILL — HUMALOG 100 UNITS/ML KWIKPE: 100 | 90 days supply | Qty: 15 | Fill #1

## 2017-03-27 MED FILL — TRESIBA FLEXTOUCH 100 UNITS: 100 | 21 days supply | Qty: 15 | Fill #5

## 2017-04-06 MED FILL — SM ALCOHOL 70% PREP PADS: 25 days supply | Qty: 100 | Fill #3

## 2017-04-06 MED FILL — FLUCONAZOLE 150 MG TABLET: 150 | 1 days supply | Qty: 1 | Fill #1

## 2017-04-07 MED FILL — PANTOPRAZOLE SOD DR 20 MG T: 20 | 90 days supply | Qty: 90 | Fill #2

## 2017-04-07 MED FILL — MELOXICAM 15 MG TABLET: 15 | 30 days supply | Qty: 30 | Fill #2

## 2017-04-20 MED FILL — BROMOCRIPTINE 2.5 MG TABLET: 2.5 | 30 days supply | Qty: 15 | Fill #1

## 2017-04-21 DIAGNOSIS — H11153 Pinguecula, bilateral: Secondary | ICD-10-CM | POA: Diagnosis not present

## 2017-04-21 DIAGNOSIS — H401112 Primary open-angle glaucoma, right eye, moderate stage: Secondary | ICD-10-CM | POA: Diagnosis not present

## 2017-04-21 DIAGNOSIS — H401121 Primary open-angle glaucoma, left eye, mild stage: Secondary | ICD-10-CM | POA: Diagnosis not present

## 2017-04-28 MED FILL — TRESIBA FLEXTOUCH 100 UNITS: 100 | 21 days supply | Qty: 15 | Fill #6

## 2017-05-02 MED FILL — LISINOPRIL 5 MG TABLET: 5 | 30 days supply | Qty: 30 | Fill #3

## 2017-05-03 ENCOUNTER — Ambulatory Visit (INDEPENDENT_AMBULATORY_CARE_PROVIDER_SITE_OTHER): Payer: 59 | Admitting: Nurse Practitioner

## 2017-05-03 ENCOUNTER — Encounter: Payer: Self-pay | Admitting: Nurse Practitioner

## 2017-05-03 VITALS — BP 132/78 | HR 91 | Temp 98.6°F | Ht 65.0 in | Wt 262.0 lb

## 2017-05-03 DIAGNOSIS — E1165 Type 2 diabetes mellitus with hyperglycemia: Secondary | ICD-10-CM | POA: Diagnosis not present

## 2017-05-03 DIAGNOSIS — E118 Type 2 diabetes mellitus with unspecified complications: Secondary | ICD-10-CM

## 2017-05-03 DIAGNOSIS — Z794 Long term (current) use of insulin: Secondary | ICD-10-CM | POA: Diagnosis not present

## 2017-05-03 DIAGNOSIS — J0140 Acute pansinusitis, unspecified: Secondary | ICD-10-CM | POA: Diagnosis not present

## 2017-05-03 DIAGNOSIS — IMO0002 Reserved for concepts with insufficient information to code with codable children: Secondary | ICD-10-CM

## 2017-05-03 LAB — GLUCOSE, POCT (MANUAL RESULT ENTRY): POC GLUCOSE: 243 mg/dL — AB (ref 70–99)

## 2017-05-03 MED ORDER — SALINE SPRAY 0.65 % NA SOLN: 1.0000 | NASAL | 0 refills | Status: DC | PRN

## 2017-05-03 MED ORDER — OXYMETAZOLINE HCL 0.05 % NA SOLN: 1.0000 | Freq: Two times a day (BID) | NASAL | 0 refills | Status: DC

## 2017-05-03 MED ORDER — AZITHROMYCIN 250 MG PO TABS: 250.0000 mg | ORAL_TABLET | Freq: Every day | ORAL | 0 refills | Status: DC

## 2017-05-03 MED ORDER — GUAIFENESIN ER 600 MG PO TB12: 600.0000 mg | ORAL_TABLET | Freq: Two times a day (BID) | ORAL | 0 refills | Status: DC | PRN

## 2017-05-03 MED ORDER — FLUTICASONE PROPIONATE 50 MCG/ACT NA SUSP: 2.0000 | Freq: Every day | NASAL | 0 refills | Status: AC

## 2017-05-03 MED FILL — AZITHROMYCIN 250 MG TABLET: 250 | 5 days supply | Qty: 6 | Fill #0

## 2017-05-03 MED FILL — FLUTICASONE PROP 50 MCG SPR: 50 | 30 days supply | Qty: 16 | Fill #0
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
## 2017-05-03 NOTE — Patient Instructions (Addendum)
URI Instructions: Flonase and Afrin use: apply 1spray of afrin in each nare, wait , then apply 2sprays of flonase in each nare. Use both nasal spray consecutively x 3days, then flonase only for at least 14days.  Encourage adequate oral hydration.  Use over-the-counter  "cold" medicines  such as "Tylenol cold" , "Advil cold",  "Mucinex" or" Mucinex D"  for cough and congestion.  Avoid decongestants if you have high blood pressure. Use" Delsym" or" Robitussin" cough syrup varietis for cough.  You can use plain "Tylenol" or "Advi"l for fever, chills and achyness.  Unable to administer depomedrol IM due to high glucose.

## 2017-05-05 DIAGNOSIS — H401121 Primary open-angle glaucoma, left eye, mild stage: Secondary | ICD-10-CM | POA: Diagnosis not present

## 2017-05-05 DIAGNOSIS — H401112 Primary open-angle glaucoma, right eye, moderate stage: Secondary | ICD-10-CM | POA: Diagnosis not present

## 2017-05-10 ENCOUNTER — Other Ambulatory Visit: Payer: Self-pay | Admitting: *Deleted

## 2017-05-11 ENCOUNTER — Encounter: Payer: Self-pay | Admitting: *Deleted

## 2017-05-11 ENCOUNTER — Other Ambulatory Visit: Payer: Self-pay | Admitting: Internal Medicine

## 2017-05-11 MED ORDER — PREGABALIN 25 MG PO CAPS: 25.0000 mg | ORAL_CAPSULE | Freq: Two times a day (BID) | ORAL | 0 refills | Status: DC

## 2017-05-11 MED FILL — LYRICA 25 MG CAPSULE: 25 | 30 days supply | Qty: 60 | Fill #0

## 2017-05-11 NOTE — Patient Outreach (Addendum)
[facility]) Care Management   05/10/2017  ICIS BUDREAU 19-Sep-1960 [id number]  Bridget [name] is an 56 y.o. female who presents to the [place] Management office for information about the [employer] flash glucose monitoring system.   Subjective: Bridget [name] says she enjoys the [employer] and is interested in more information about the [employer] personal CGM.   Objective:   Bridget [name] remains an active participant in the Careers information officer program for type II DM diabetes self management.   Review of Systems  Constitutional: Negative.     Physical Exam  Constitutional: She is oriented to person, place, and time. She appears well-developed and well-nourished.  Respiratory: Effort normal.  Neurological: She is alert and oriented to person, place, and time.  Skin: Skin is warm and dry.  Psychiatric: She has a normal mood and affect. Her behavior is normal. Judgment and thought content normal.    Filed Weights   05/10/17 0816  Weight: 261 lb 6.4 oz (118.6 kg)   Vitals:   05/10/17 0816  BP: 120/74   Encounter Medications:   Outpatient Encounter Prescriptions as of 05/10/2017  Medication Sig Note  . Alcohol Swabs (ALCOHOL PREPS) PADS 1 Device by Does not apply route 4 (four) times daily -  before meals and at bedtime.   Marland Kitchen amitriptyline (ELAVIL) 25 MG tablet Take 0.5 tablets (12.5 mg total) by mouth at bedtime.   Marland Kitchen aspirin 81 MG tablet Take 1 tablet (81 mg total) by mouth daily.   . Blood Glucose Monitoring Suppl (ACCU-CHEK GUIDE) w/Device KIT 2 each by Does not apply route daily.   . bromocriptine (PARLODEL) 2.5 MG tablet TAKE 1/2 TABLET BY MOUTH ONCE DAILY   . cetirizine (ZYRTEC) 10 MG tablet Take 1 tablet (10 mg total) by mouth daily. 05/19/2016: Takes prn  . colesevelam (WELCHOL) 625 MG tablet Take 2 tablets (1,250 mg total) by mouth 2 (two) times daily with a meal.   . Cranberry-Vitamin  C-Probiotic (AZO CRANBERRY PO) Take by mouth daily. Reported on 10/13/2015 03/08/2016: Takes prn  . diclofenac sodium (VOLTAREN) 1 % GEL Apply 2 g topically 4 (four) times daily. Rub into affected area of foot 2 to 4 times daily   . docusate sodium (COLACE) 100 MG capsule Take 1 capsule (100 mg total) by mouth 3 (three) times daily as needed.   . fluticasone (FLONASE) 50 MCG/ACT nasal spray Place 2 sprays into both nostrils daily.   Marland Kitchen glucose blood (ACCU-CHEK GUIDE) test strip Use to check blood sugar 2 times per day. Dx code: E11.9   . insulin degludec (TRESIBA FLEXTOUCH) 100 UNIT/ML SOPN FlexTouch Pen Inject 0.6 mLs (60 Units total) into the skin daily. And pen needles 1/day   . insulin lispro (HUMALOG KWIKPEN) 100 UNIT/ML KiwkPen Inject 0.15 mLs (15 Units total) into the skin daily. With the biggest meal, and pen needles 2/day   . Lancets (ACCU-CHEK MULTICLIX) lancets Use to check blood sugar 2 times per day. Dx code E11.9   . lisinopril (PRINIVIL,ZESTRIL) 5 MG tablet TAKE 1 TABLET BY MOUTH DAILY.   . pantoprazole (PROTONIX) 20 MG tablet TAKE 1 TABLET BY MOUTH DAILY.   Marland Kitchen pregabalin (LYRICA) 150 MG capsule Take 1 capsule (150 mg total) by mouth 2 (two) times daily. Needs visit for more refill   . pregabalin (LYRICA) 25 MG capsule Take 1 capsule (25 mg total) by mouth 2 (two) times daily. Needs visit for any more refills   .  sodium chloride (OCEAN) 0.65 % SOLN nasal spray Place 1 spray into both nostrils as needed for congestion.   . traMADol (ULTRAM) 50 MG tablet Take 1 tablet (50 mg total) by mouth every 6 (six) hours as needed.   Marland Kitchen azithromycin (ZITHROMAX Z-PAK) 250 MG tablet Take 1 tablet (250 mg total) by mouth daily. Take 2tabs on first day, then 1tab once a day till complete (Patient not taking: Reported on 05/11/2017)   . cyclobenzaprine (FLEXERIL) 5 MG tablet Take 1 tablet (5 mg total) by mouth 3 (three) times daily as needed for muscle spasms. Needs visit for refills (Patient not taking:  Reported on 05/11/2017)   . guaiFENesin (MUCINEX) 600 MG 12 hr tablet Take 1 tablet (600 mg total) by mouth 2 (two) times daily as needed for cough or to loosen phlegm. (Patient not taking: Reported on 05/11/2017)   . NALTREXONE HCL PO Take 3 mg by mouth daily. 05/19/2016: Did not tolerate due to headaches  . NONFORMULARY OR COMPOUNDED ITEM Take 1 tablet by mouth at bedtime. Reported on 03/08/2016 05/19/2016: Did not tolerate it due to headaches  . NONFORMULARY OR COMPOUNDED ITEM [facility]: Achillies Tendonitis Cream - Diclofenac 3%, Baclofen 2%, Bupivacaine 1%, Gabapentin 6%, Ibuprofen 3%, Pentoxifylline 3%, apply 1-2 gram to feet 3-4 times daily. (Patient not taking: Reported on 05/11/2017)   . ondansetron (ZOFRAN ODT) 8 MG disintegrating tablet Take 1 tablet (8 mg total) by mouth every 8 (eight) hours as needed for nausea or vomiting. (Patient not taking: Reported on 05/11/2017)   . OVER THE COUNTER MEDICATION 1 capsule daily.  03/08/2016: Curamin is curcumin and boswella and DLPA and nattokinase- she takes it for chronic  pain and inflammation  . oxymetazoline (AFRIN NASAL SPRAY) 0.05 % nasal spray Place 1 spray into both nostrils 2 (two) times daily. Use only for 3days, then stop   . triamcinolone (NASACORT AQ) 55 MCG/ACT AERO nasal inhaler Place 2 sprays into the nose daily. (Patient not taking: Reported on 05/11/2017)   . TRUEPLUS LANCETS 28G MISC Use to help check blood sugars twice a day Dx E11.9 (Patient not taking: Reported on 05/11/2017)   . TRUETEST TEST test strip USE AS INSTRUCTED (Patient not taking: Reported on 05/11/2017) 03/08/2016: Using True Metrix  . [DISCONTINUED] LYRICA 25 MG capsule TAKE 1 CAPSULE BY MOUTH TWICE DAILY    No facility-administered encounter medications on file as of 05/10/2017.     Functional Status:   In your present state of health, do you have any difficulty performing the following activities: 05/10/2017 08/11/2016  Hearing? N N  Vision? N N  Difficulty  concentrating or making decisions? N N  Walking or climbing stairs? N N  Dressing or bathing? - N  Doing errands, shopping? N N  Preparing Food and eating ? N -  Using the Toilet? N -  In the past six months, have you accidently leaked urine? N -  Do you have problems with loss of bowel control? N -  Managing your Medications? N -  Managing your Finances? N -  Housekeeping or managing your Housekeeping? N -  Some recent data might be hidden    Fall/Depression Screening:    PHQ 2/9 Scores 02/08/2016 01/12/2016 12/15/2014 09/14/2014 09/27/2013 08/07/2013 12/08/2011  PHQ - 2 Score 0 0 1 0 0 0 6  PHQ- 9 Score - - - - - - 6  Exception Documentation Patient refusal - - - - - -    Assessment:   [employer]  employee and [employer] participant seeking additional information regarding the [employer] personal flash glucose monitoring system.   Plan:   [facility] CM Care Plan Problem One   Flowsheet Row Most Recent Value  Care Plan Problem One Knowledge deficit related to the [facility] flash glucose monitoring system  Role Documenting the Problem One  Care Management Coordinator  Care Plan for Problem One  Active  [facility] Long Term Goal (31-90 days)  Patient will voice understanding of the Fre[employer] Lib[employer] flash glucose monitoring system and contact this RNCM for any questions  [facility] Long Term Goal Start Date 05/10/17  [facility] Long Term Goal Met Date    Interventions for Problem One Long Term Goal Using the demonstration kit- discussed elements of the Freestyle Libre flash glucose system, how to apply sensor, sensor warm up period, sensor life, how to scan sensor using reader and how to manually enter glucose reading into [place], how to view interstitial glucose data from reader and the out of pocket cost for the system, also provided an informational brochure about the [facility], reviewed upcoming appointment with Dr. Loanne Drilling on 06/23/17, will continue to monitor Ercilia' DM self management  skills via the [place] to fax today's office visit note to Dr. Sharlet Salina and Dr. Loanne Drilling. Barrington Ellison RN,CCM,CDE [place] Management Coordinator Link To Wellness Office Phone [phone] Office Fax [phone]

## 2017-05-11 NOTE — Telephone Encounter (Signed)
Faxed to [facility] outpatient pharmacy

## 2017-05-17 ENCOUNTER — Telehealth: Payer: Self-pay | Admitting: Endocrinology

## 2017-05-17 ENCOUNTER — Other Ambulatory Visit: Payer: Self-pay

## 2017-05-17 MED ORDER — INSULIN LISPRO 100 UNIT/ML (KWIKPEN): 15.0000 [IU] | PEN_INJECTOR | Freq: Every day | SUBCUTANEOUS | 11 refills | Status: DC

## 2017-05-17 MED ORDER — INSULIN PEN NEEDLE 31G X 5 MM MISC: 8 refills | Status: DC

## 2017-05-17 MED ORDER — INSULIN DEGLUDEC 100 UNIT/ML ~~LOC~~ SOPN: 60.0000 [IU] | PEN_INJECTOR | Freq: Every day | SUBCUTANEOUS | 11 refills | Status: DC

## 2017-05-17 MED FILL — TRESIBA FLEXTOUCH 100 UNITS: 100 | 25 days supply | Qty: 15 | Fill #0

## 2017-05-17 NOTE — Telephone Encounter (Signed)
Marylu Lund, nurse case manager called to advise that the patient has not been taking her insulin since Sunday due to her not having any more of:  Pens and Needles for both the   insulin degludec (TRESIBA FLEXTOUCH) 100 UNIT/ML SOPN FlexTouch Pen    insulin lispro (HUMALOG KWIKPEN) 100 UNIT/ML KiwkPen    Please send PENS AND NEEDLES FOR BOTH TRESIBA AND HUMALOG to:  [facility] - [place], [place] - [address] [phone] (Phone) [phone] (Fax)

## 2017-05-18 ENCOUNTER — Other Ambulatory Visit: Payer: Self-pay

## 2017-05-18 ENCOUNTER — Telehealth: Payer: Self-pay | Admitting: Endocrinology

## 2017-05-18 MED ORDER — INSULIN PEN NEEDLE 31G X 5 MM MISC: 8 refills | Status: DC

## 2017-05-18 MED FILL — UNIFINE PENTIPS 31GX3/16: 31G X 5 MM | 33 days supply | Qty: 100 | Fill #0

## 2017-05-18 MED FILL — UNIFINE PENTIPS 31GX3/16": 31G X 5 MM | 33 days supply | Qty: 100 | Fill #0

## 2017-05-18 NOTE — Telephone Encounter (Signed)
-----   Message from Bary Richard, RN sent at 05/17/2017  3:24 PM EDT ----- Regarding: Need insulin pen needles Dr. Everardo All,  [name] me in the [facility] app last night and said she could not pick up her needles for her insulin pens because she needs a refill on the needles. She also went on to say that she has not taken her insulin since Sunday because she was out of the needles for her insulin pens. Per her [employer] data, her fasting blood sugars this week have varied from 190-216. I will educate her on the importance of keeping her supplies updated so that she does not miss insulin doses. If you will please take care the Rx for  insulins pens and needles I would greatly appreciate it. I also spoke with [place] at your office just now. Marylu Lund

## 2017-05-18 NOTE — Telephone Encounter (Signed)
Please refill pen needles PRN

## 2017-05-18 NOTE — Telephone Encounter (Signed)
Sent in prescription for pen needles.

## 2017-05-24 MED FILL — BROMOCRIPTINE 2.5 MG TABLET: 2.5 | 30 days supply | Qty: 15 | Fill #2

## 2017-06-07 MED FILL — HUMALOG 100 UNITS/ML KWIKPE: 100 | 90 days supply | Qty: 15 | Fill #0

## 2017-06-12 MED FILL — LISINOPRIL 5 MG TABLET: 5 | 30 days supply | Qty: 30 | Fill #4

## 2017-06-12 MED FILL — ACCU-CHEK FASTCLIX LANCETS: 88 days supply | Qty: 204 | Fill #1

## 2017-06-12 MED FILL — ACCU-CHEK GUIDE TEST STRIP: 90 days supply | Qty: 200 | Fill #1

## 2017-06-19 MED FILL — UNIFINE PENTIPS 31GX3/16": 31G X 5 MM | 33 days supply | Qty: 100 | Fill #1

## 2017-06-19 MED FILL — UNIFINE PENTIPS 31GX3/16: 31G X 5 MM | 33 days supply | Qty: 100 | Fill #1

## 2017-06-22 ENCOUNTER — Ambulatory Visit (INDEPENDENT_AMBULATORY_CARE_PROVIDER_SITE_OTHER): Payer: 59 | Admitting: Internal Medicine

## 2017-06-22 ENCOUNTER — Encounter: Payer: Self-pay | Admitting: Internal Medicine

## 2017-06-22 DIAGNOSIS — J0140 Acute pansinusitis, unspecified: Secondary | ICD-10-CM | POA: Diagnosis not present

## 2017-06-22 MED ORDER — PREGABALIN 50 MG PO CAPS: 50.0000 mg | ORAL_CAPSULE | Freq: Two times a day (BID) | ORAL | 0 refills | Status: DC

## 2017-06-22 MED ORDER — BENZONATATE 200 MG PO CAPS: 200.0000 mg | ORAL_CAPSULE | Freq: Two times a day (BID) | ORAL | 0 refills | Status: DC | PRN

## 2017-06-22 MED ORDER — AZITHROMYCIN 250 MG PO TABS: ORAL_TABLET | ORAL | 0 refills | Status: DC

## 2017-06-22 MED FILL — BENZONATATE 200 MG CAPSULE: 200 | 23 days supply | Qty: 45 | Fill #0

## 2017-06-22 MED FILL — AZITHROMYCIN 250 MG TABLET: 250 | 5 days supply | Qty: 6 | Fill #0

## 2017-06-22 NOTE — Progress Notes (Signed)
   Subjective:    Patient ID: Bridget [name], female    DOB: 04-22-1961, 56 y.o.   MRN: [id number]  HPI The patient is a 56 YO female coming in for sinus problems. Pain and drainage for the last 2-3 weeks. She was treated about 2 months ago and symptoms resolved fully. She is still taking flonase daily which does help the sinuses to drain. She does have some chills. Some fatigue and mild SOB although it does not limit activities. She does have sinus pressure, sore throat, drainage. Overall worsening since onset. Has tried some leftover tessalon perles which were helpful.   Review of Systems  Constitutional: Positive for activity change, chills and fatigue. Negative for appetite change, fever and unexpected weight change.  HENT: Positive for congestion, postnasal drip, rhinorrhea, sinus pain, sinus pressure and sore throat. Negative for ear discharge, ear pain, tinnitus, trouble swallowing and voice change.   Eyes: Negative.   Respiratory: Negative for cough, chest tightness and shortness of breath.   Cardiovascular: Negative for chest pain, palpitations and leg swelling.  Gastrointestinal: Negative for abdominal distention, abdominal pain, constipation, diarrhea, nausea and vomiting.      Objective:   Physical Exam  Constitutional: She is oriented to person, place, and time. She appears well-developed and well-nourished.  HENT:  Head: Normocephalic and atraumatic.  Right Ear: External ear normal.  Left Ear: External ear normal.  Sinus pressure frontal, oropharynx with redness and drainage, nasal turbinates with crusting.   Eyes: EOM are normal.  Neck: Normal range of motion.  Cardiovascular: Normal rate and regular rhythm.   Pulmonary/Chest: Effort normal and breath sounds normal. No respiratory distress. She has no wheezes. She has no rales.  Abdominal: Soft. Bowel sounds are normal. She exhibits no distension. There is no tenderness. There is no rebound.  Lymphadenopathy:    She has  no cervical adenopathy.  Neurological: She is alert and oriented to person, place, and time. Coordination normal.   Vitals:   06/22/17 1123  BP: 120/80  Pulse: 85  Temp: 98.5 F (36.9 C)  TempSrc: Oral  SpO2: 98%  Weight: 258 lb (117 kg)  Height: 5\' 5"  (1.651 m)      Assessment & Plan:

## 2017-06-22 NOTE — Patient Instructions (Signed)
We have given you the lyrica prescription today. Come back in 1 month to see how it is doing.   We have sent in z-pack. Take 2 pills today then 1 pill daily until gone. We have also refilled the tessalon perles.

## 2017-06-22 NOTE — Assessment & Plan Note (Signed)
Rx for azithromycin given pcn allergy. Continue flonase and rx for tessalon perles for cough.

## 2017-06-23 ENCOUNTER — Ambulatory Visit (INDEPENDENT_AMBULATORY_CARE_PROVIDER_SITE_OTHER): Payer: 59 | Admitting: Endocrinology

## 2017-06-23 ENCOUNTER — Encounter: Payer: Self-pay | Admitting: Endocrinology

## 2017-06-23 VITALS — BP 142/90 | HR 87 | Wt 262.0 lb

## 2017-06-23 DIAGNOSIS — E118 Type 2 diabetes mellitus with unspecified complications: Secondary | ICD-10-CM

## 2017-06-23 DIAGNOSIS — E1165 Type 2 diabetes mellitus with hyperglycemia: Secondary | ICD-10-CM | POA: Diagnosis not present

## 2017-06-23 DIAGNOSIS — IMO0002 Reserved for concepts with insufficient information to code with codable children: Secondary | ICD-10-CM

## 2017-06-23 LAB — POCT GLYCOSYLATED HEMOGLOBIN (HGB A1C): Hemoglobin A1C: 7.1

## 2017-06-23 MED ORDER — FREESTYLE LIBRE READER DEVI: 1.0000 | Freq: Once | 0 refills | Status: AC

## 2017-06-23 MED ORDER — FREESTYLE LIBRE SENSOR SYSTEM MISC: 1.0000 | 3 refills | Status: DC

## 2017-06-23 MED FILL — TRESIBA FLEXTOUCH 100 UNITS: 100 | 25 days supply | Qty: 15 | Fill #1

## 2017-06-23 NOTE — Progress Notes (Signed)
Subjective:    Patient ID: Bridget [name], female    DOB: 1961/08/11, 56 y.o.   MRN: [id number]  HPI Pt returns for f/u of diabetes mellitus: DM type: Insulin-requiring type 2.  Dx'ed: 4076 Complications: polyneuropathy.   Therapy: insulin + 2 oral meds.   GDM: never.  DKA: never.   Severe hypoglycemia: never.  Pancreatitis: never.  Other: she did not tolerate invokana (vaginitis) or metformin-XR (diarrhea); edema precludes pioglitizone rx.  she took insulin from 2000-2003; she resumed in 2017, She declines multiple daily injections; she works 3rd shift.  Interval history: she brings her phone, with her cbg's which I have reviewed today.  It varies from 91-197.  She says she sometimes skips her insulin, but "not that often."   Past Medical History:  Diagnosis Date  . Bacterial vaginosis 12/07/2010  . Depression   . Diabetes mellitus without complication ([place])   . Diverticulosis   . DIVERTICULOSIS, COLON 10/26/2006  . Fibromyalgia   . GERD (gastroesophageal reflux disease)   . Glaucoma   . Headache   . HEEL SPUR 09/10/2007   Qualifier: Diagnosis of  By: Drue Flirt  MD, Merrily Brittle    . Hypertension   . MALAISE AND FATIGUE 05/27/2010  . Obesity   . OBESITY, NOS 10/26/2006  . Osteoarthritis   . OSTEOARTHRITIS, KNEE 04/27/2010  . Retained tampon 05/21/2012  . SHOULDER PAIN, LEFT 05/19/2010   Qualifier: Diagnosis of  By: Annamary Carolin MD, Amber    . Vaginal yeast infection 12/08/2010    Past Surgical History:  Procedure Laterality Date  . ABDOMINAL HYSTERECTOMY  1990  . ABDOMINAL HYSTERECTOMY    . BLADDER SURGERY  2001  . CATARACT EXTRACTION Right 2001   with implant  . EYE SURGERY     implant rt eye  . ROTATOR CUFF REPAIR Right   . SHOULDER ARTHROSCOPY WITH SUBACROMIAL DECOMPRESSION Left 07/20/2015   Procedure: SHOULDER ARTHROSCOPY WITH DEBRIDEMENT OF ROTATOR CUFF AND SUBACROMIAL DECOMPRESSION ;  Surgeon: Tania Ade, MD;  Location: [facility];  Service:  Orthopedics;  Laterality: Left;  Left shoulder arthroscopy with debridement of rotator cuff and subacromial decompression    Social History   Social History  . Marital status: Divorced    Spouse name: N/A  . Number of children: 2  . Years of education: N/A   Occupational History  . PBX operator [employer]   Social History Main Topics  . Smoking status: Former Smoker    Packs/day: 0.30    Types: Cigarettes    Quit date: 08/29/1998  . Smokeless tobacco: Never Used     Comment: Quit in 2000   . Alcohol use Yes     Comment: socially  . Drug use: No  . Sexual activity: Yes    Birth control/ protection: Surgical   Other Topics Concern  . Not on file   Social History Narrative   Works at [employer] as a Museum/gallery conservator    Current Outpatient Prescriptions on File Prior to Visit  Medication Sig Dispense Refill  . Alcohol Swabs (ALCOHOL PREPS) PADS 1 Device by Does not apply route 4 (four) times daily -  before meals and at bedtime. 100 each 11  . amitriptyline (ELAVIL) 25 MG tablet Take 0.5 tablets (12.5 mg total) by mouth at bedtime. 30 tablet 2  . aspirin 81 MG tablet Take 1 tablet (81 mg total) by mouth daily.    . benzonatate (TESSALON) 200 MG capsule Take 1 capsule (200 mg total) by mouth  2 (two) times daily as needed for cough. 45 capsule 0  . Blood Glucose Monitoring Suppl (ACCU-CHEK GUIDE) w/Device KIT 2 each by Does not apply route daily. 1 kit 2  . bromocriptine (PARLODEL) 2.5 MG tablet TAKE 1/2 TABLET BY MOUTH ONCE DAILY 15 tablet 11  . cetirizine (ZYRTEC) 10 MG tablet Take 1 tablet (10 mg total) by mouth daily. 30 tablet 11  . colesevelam (WELCHOL) 625 MG tablet Take 2 tablets (1,250 mg total) by mouth 2 (two) times daily with a meal. 60 tablet 11  . Cranberry-Vitamin C-Probiotic (AZO CRANBERRY PO) Take by mouth daily. Reported on 10/13/2015    . cyclobenzaprine (FLEXERIL) 5 MG tablet Take 1 tablet (5 mg total) by mouth 3 (three) times daily as needed for muscle spasms.  Needs visit for refills 90 tablet 0  . diclofenac sodium (VOLTAREN) 1 % GEL Apply 2 g topically 4 (four) times daily. Rub into affected area of foot 2 to 4 times daily (Patient not taking: Reported on 06/22/2017) 100 g 2  . docusate sodium (COLACE) 100 MG capsule Take 1 capsule (100 mg total) by mouth 3 (three) times daily as needed. 20 capsule 0  . fluticasone (FLONASE) 50 MCG/ACT nasal spray Place 2 sprays into both nostrils daily. 16 g 0  . glucose blood (ACCU-CHEK GUIDE) test strip Use to check blood sugar 2 times per day. Dx code: E11.9 200 each 2  . guaiFENesin (MUCINEX) 600 MG 12 hr tablet Take 1 tablet (600 mg total) by mouth 2 (two) times daily as needed for cough or to loosen phlegm. 14 tablet 0  . insulin degludec (TRESIBA FLEXTOUCH) 100 UNIT/ML SOPN FlexTouch Pen Inject 0.6 mLs (60 Units total) into the skin daily. And pen needles 1/day 10 pen 11  . insulin lispro (HUMALOG KWIKPEN) 100 UNIT/ML KiwkPen Inject 0.15 mLs (15 Units total) into the skin daily. With the biggest meal, and pen needles 2/day 15 mL 11  . Insulin Pen Needle 31G X 5 MM MISC Used for insulin injections 3x daily. 100 each 8  . Lancets (ACCU-CHEK MULTICLIX) lancets Use to check blood sugar 2 times per day. Dx code E11.9 200 each 2  . lisinopril (PRINIVIL,ZESTRIL) 5 MG tablet TAKE 1 TABLET BY MOUTH DAILY. 30 tablet 5  . NALTREXONE HCL PO Take 3 mg by mouth daily.    . NONFORMULARY OR COMPOUNDED ITEM Take 1 tablet by mouth at bedtime. Reported on 03/08/2016    . NONFORMULARY OR COMPOUNDED ITEM [facility]: [place]ies Tendonitis Cream - Diclofenac 3%, Baclofen 2%, Bupivacaine 1%, Gabapentin 6%, Ibuprofen 3%, Pentoxifylline 3%, apply 1-2 gram to feet 3-4 times daily. (Patient not taking: Reported on 05/11/2017) 120 each 0  . ondansetron (ZOFRAN ODT) 8 MG disintegrating tablet Take 1 tablet (8 mg total) by mouth every 8 (eight) hours as needed for nausea or vomiting. 20 tablet 0  . OVER THE COUNTER MEDICATION 1 capsule  daily.     Marland Kitchen oxymetazoline (AFRIN NASAL SPRAY) 0.05 % nasal spray Place 1 spray into both nostrils 2 (two) times daily. Use only for 3days, then stop 30 mL 0  . pantoprazole (PROTONIX) 20 MG tablet TAKE 1 TABLET BY MOUTH DAILY. 30 tablet PRN  . pregabalin (LYRICA) 150 MG capsule Take 1 capsule (150 mg total) by mouth 2 (two) times daily. Needs visit for more refill 60 capsule 1  . pregabalin (LYRICA) 50 MG capsule Take 1 capsule (50 mg total) by mouth 2 (two) times daily. 60 capsule 0  .  sodium chloride (OCEAN) 0.65 % SOLN nasal spray Place 1 spray into both nostrils as needed for congestion. 15 mL 0  . traMADol (ULTRAM) 50 MG tablet Take 1 tablet (50 mg total) by mouth every 6 (six) hours as needed. 15 tablet 0  . triamcinolone (NASACORT AQ) 55 MCG/ACT AERO nasal inhaler Place 2 sprays into the nose daily. 1 Inhaler 12  . TRUEPLUS LANCETS 28G MISC Use to help check blood sugars twice a day Dx E11.9 100 each 3  . TRUETEST TEST test strip USE AS INSTRUCTED 100 each 11   No current facility-administered medications on file prior to visit.     Allergies  Allergen Reactions  . Citrus Itching  . Invokana [Canagliflozin] Other (See Comments)    Caused frequent vaginal yeast infections  . Codeine Hives  . Metformin And Related Diarrhea and Nausea And Vomiting  . Metoclopramide Hcl Nausea And Vomiting  . Penicillins Nausea And Vomiting  . Propoxyphene N-Acetaminophen Nausea And Vomiting  . Sulfonamide Derivatives Hives    Family History  Problem Relation Age of Onset  . Breast cancer Mother   . Diabetes Mother   . Ovarian cancer Maternal Grandmother   . Heart disease Maternal Grandmother        great  . Stomach cancer Cousin   . Diabetes Brother        x 3    BP (!) 142/90   Pulse 87   Wt 262 lb (118.8 kg)   SpO2 97%   BMI 43.60 kg/m    Review of Systems She denies hypoglycemia.     Objective:   Physical Exam VITAL SIGNS:  See vs page GENERAL: no distress Pulses: foot  pulses are intact bilaterally.   MSK: no deformity of the feet or ankles.  CV: trace bilat edema of the legs.  Skin:  no ulcer on the feet or ankles, but the skin is dry.  normal temp on the feet and ankles. Neuro: sensation is intact to touch on the feet and ankles.    A1c=7.1%    Assessment & Plan:  Noncompliance with insulin: this complicates the rx of DM.   Insulin-requiring type 2 DM, with polyneuropathy: this is the best control this pt should aim for, given this regimen, which does match insulin to her changing needs throughout the day.    Patient Instructions  On this type of insulin schedule, you should eat meals on a regular schedule.  If a meal is missed or significantly delayed, your blood sugar could go low.   check your blood sugar once a day.  vary the time of day when you check, between before the 3 meals, and at bedtime.  also check if you have symptoms of your blood sugar being too high or too low.  please keep a record of the readings and bring it to your next appointment here (or you can bring the meter itself).  You can write it on any piece of paper.  please call us sooner if your blood sugar goes below 70, or if you have a lot of readings over 200.  With the [place], if you miss it, you can still take the full amount 12 hours later.   I have sent a prescription to your pharmacy, for the "freestyle libre" device.   Please come back for a follow-up appointment in 3 months.

## 2017-06-23 NOTE — Patient Instructions (Addendum)
On this type of insulin schedule, you should eat meals on a regular schedule.  If a meal is missed or significantly delayed, your blood sugar could go low.   check your blood sugar once a day.  vary the time of day when you check, between before the 3 meals, and at bedtime.  also check if you have symptoms of your blood sugar being too high or too low.  please keep a record of the readings and bring it to your next appointment here (or you can bring the meter itself).  You can write it on any piece of paper.  please call us sooner if your blood sugar goes below 70, or if you have a lot of readings over 200.  With the [place], if you miss it, you can still take the full amount 12 hours later.   I have sent a prescription to your pharmacy, for the "freestyle libre" device.   Please come back for a follow-up appointment in 3 months.

## 2017-07-05 MED FILL — BROMOCRIPTINE 2.5 MG TABLET: 2.5 | 30 days supply | Qty: 15 | Fill #3

## 2017-07-19 MED FILL — LISINOPRIL 5 MG TABLET: 5 | 30 days supply | Qty: 30 | Fill #5

## 2017-07-24 ENCOUNTER — Ambulatory Visit (INDEPENDENT_AMBULATORY_CARE_PROVIDER_SITE_OTHER): Payer: 59 | Admitting: Internal Medicine

## 2017-07-24 ENCOUNTER — Encounter: Payer: Self-pay | Admitting: Internal Medicine

## 2017-07-24 DIAGNOSIS — G8929 Other chronic pain: Secondary | ICD-10-CM

## 2017-07-24 DIAGNOSIS — R52 Pain, unspecified: Secondary | ICD-10-CM | POA: Diagnosis not present

## 2017-07-24 MED ORDER — PREGABALIN 150 MG PO CAPS: 150.0000 mg | ORAL_CAPSULE | Freq: Two times a day (BID) | ORAL | 5 refills | Status: DC

## 2017-07-24 NOTE — Assessment & Plan Note (Signed)
Refill lyrica 150 mg to take BID. She is encouraged to take regularly as this helps prevent her from missing work. If she is having problems with the pharmacy filing it we can call pharmacy but she has to let us know. Will continue to monitor for fills as she has inconsistent fills in the past and  Insists she is taking regularly.

## 2017-07-24 NOTE — Patient Instructions (Signed)
Keep taking the zyrtec and flonase for the cold symptoms. We have sent in the lyrica 150 mg to fill since this is helping more.

## 2017-07-24 NOTE — Progress Notes (Signed)
   Subjective:    Patient ID: Bridget [name], female    DOB: June 30, 1961, 56 y.o.   MRN: [id number]  HPI The patient is a 56 YO female coming in for follow up of her neuropathy. She had been having problems getting her lyrica filled due to 2 different strengths. She is taking 50 mg daily on days she works and 150 mg daily when she does not work. She has not filled any lyrica since the last visit per [place] controlled substance database. She admits to being out of lyrica 150 mg daily for some months due to pharmacy not filling but denies calling us to tell us or for refills. She denies worsening pain except for being out of her meds.  Review of Systems  Constitutional: Negative.   Respiratory: Negative for cough, chest tightness and shortness of breath.   Cardiovascular: Negative for chest pain, palpitations and leg swelling.  Gastrointestinal: Negative for abdominal distention, abdominal pain, constipation, diarrhea, nausea and vomiting.  Musculoskeletal: Positive for myalgias.  Skin: Negative.   Neurological: Positive for numbness.  Psychiatric/Behavioral: Negative.       Objective:   Physical Exam  Constitutional: She is oriented to person, place, and time. She appears well-developed and well-nourished.  HENT:  Head: Normocephalic and atraumatic.  Eyes: EOM are normal.  Neck: Normal range of motion.  Cardiovascular: Normal rate and regular rhythm.  Pulmonary/Chest: Effort normal and breath sounds normal. No respiratory distress. She has no wheezes. She has no rales.  Abdominal: Soft.  Musculoskeletal: She exhibits no edema.  Neurological: She is alert and oriented to person, place, and time. Coordination normal.  Skin: Skin is warm and dry.   Vitals:   07/24/17 1003  BP: 128/82  Pulse: (!) 103  Temp: 99 F (37.2 C)  TempSrc: Oral  SpO2: 100%  Weight: 256 lb 4 oz (116.2 kg)  Height: 5\' 5"  (1.651 m)      Assessment & Plan:

## 2017-07-28 ENCOUNTER — Other Ambulatory Visit: Payer: Self-pay | Admitting: *Deleted

## 2017-07-28 NOTE — Patient Outreach (Signed)
Bridget [name] transitioned from the [employer] [facility] program to the [employer] digital assistant platform on 09/08/16 for Type II diabetes self-management assistance so will close case to the diabetes [facility] program due to delegation of disease management services to [employer] from [employer] for [employer] [employer] plan members in 2019. Bridget RichardJanet S. Hauser RN,CCM,CDE [facility] Care Management Coordinator [facility] To Wellness and [employer] Office Phone [phone] Office Fax [phone]

## 2017-08-02 ENCOUNTER — Telehealth: Payer: Self-pay | Admitting: Internal Medicine

## 2017-08-02 NOTE — Telephone Encounter (Signed)
Copied from CRM [id number]. Topic: Inquiry >> Aug 02, 2017  9:19 AM Anice PaganiniMunoz, Renelle Stegenga I, NT wrote: Reason for CRM: Pt call she need antibiotics for her head cold she is cough sore throat she field  she getting worse, Thanks

## 2017-08-03 NOTE — Telephone Encounter (Signed)
States that she has been using that everyday now for two weeks, states that it is breaking it up but it is not clearing it up. At night the mucus gets really thick in the back of her throat causing her to cough horribly. States she is taking the pearls and it is not helping. At work is having trouble talking to people over the phone she just coughs. Wants to know if there is anything that can be sent in to dry her up.

## 2017-08-03 NOTE — Telephone Encounter (Signed)
Not really, with her diabetes this limits the options.

## 2017-08-03 NOTE — Telephone Encounter (Signed)
Would recommend to continue zyrtec and flonase.

## 2017-08-04 NOTE — Telephone Encounter (Signed)
Patient very upset and un-happy and does not understand why she cannot get a ABX sent in to help her its been two weeks and has had to call off work. States she understands because of her diabetes, but she is taking OTC stuff that is not working which is running her sugar numbers up. States if you will not send anything in for her that she is not going to go back to work till she feels better because it is that bad will need written out.

## 2017-08-04 NOTE — Telephone Encounter (Signed)
She can come in to be re-evaluated. Based on her last visit she would not need to still be out of work so we could not write a note for that.

## 2017-08-11 MED FILL — UNIFINE PENTIPS 31GX3/16": 31G X 5 MM | 33 days supply | Qty: 100 | Fill #2

## 2017-08-11 MED FILL — TRESIBA FLEXTOUCH 100 UNITS: 100 | 25 days supply | Qty: 15 | Fill #2

## 2017-08-11 MED FILL — BROMOCRIPTINE 2.5 MG TABLET: 2.5 | 30 days supply | Qty: 15 | Fill #4

## 2017-08-11 MED FILL — UNIFINE PENTIPS 31GX3/16: 31G X 5 MM | 33 days supply | Qty: 100 | Fill #2

## 2017-08-21 MED FILL — PANTOPRAZOLE SOD DR 20 MG T: 20 | 90 days supply | Qty: 90 | Fill #3

## 2017-08-31 ENCOUNTER — Other Ambulatory Visit: Payer: Self-pay | Admitting: Internal Medicine

## 2017-08-31 IMAGING — DX DG CHEST 2V
2 series · 2 of 2 positions shown · non-contrast
Comparison: 02/01/2015 and chest CT dated 02/07/2014.

CLINICAL DATA: Morbid obesity. History of hypertension and
diabetes.

EXAM:
CHEST  2 VIEW

[chest pa]
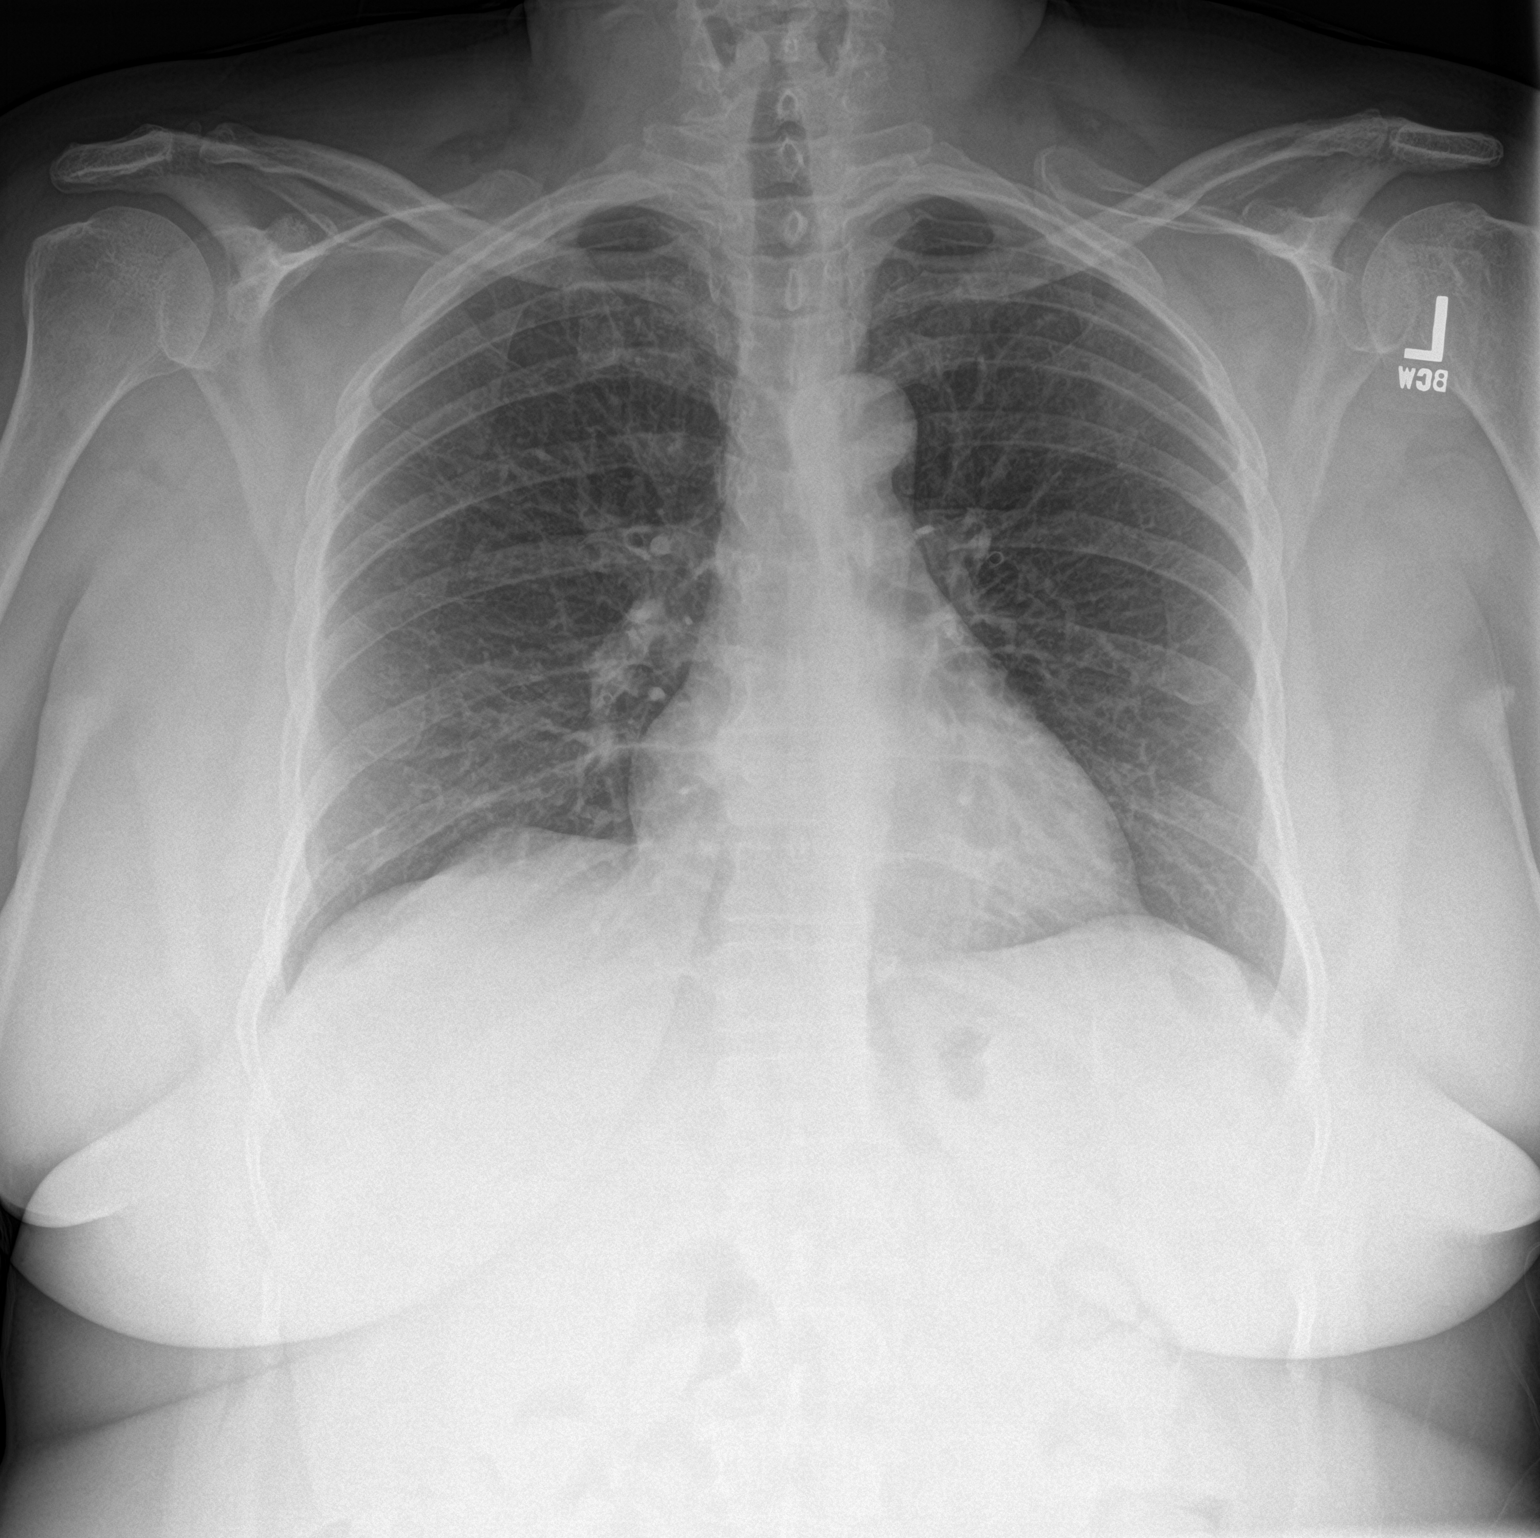

[chest lat]
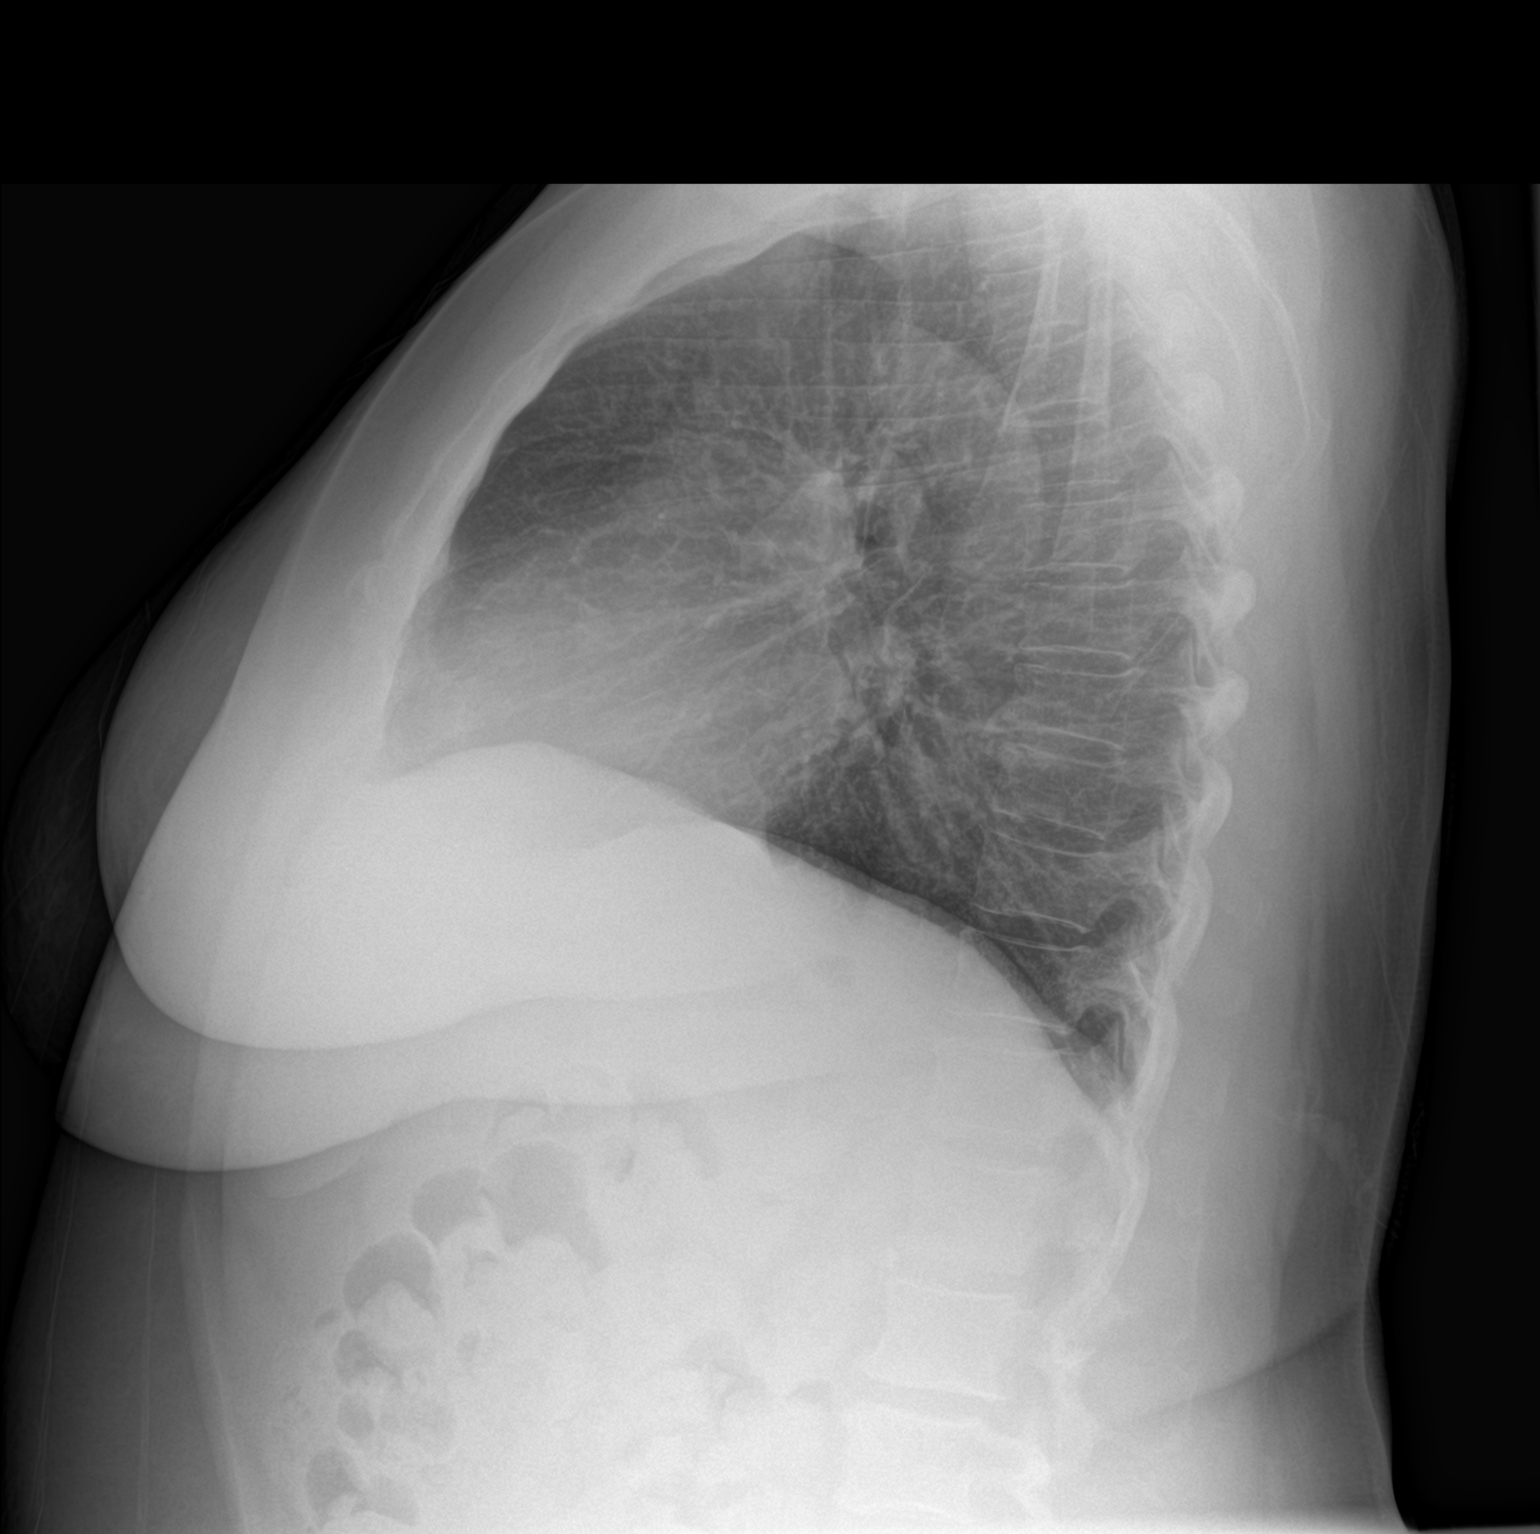

[2 of 2 positions shown; findings below may reference images not displayed]

FINDINGS: Normal sized heart. Clear lungs. Stable mild diffuse peribronchial
thickening. Unremarkable bones.
IMPRESSION: No acute abnormality.  Stable mild chronic bronchitic changes.

## 2017-08-31 MED FILL — LISINOPRIL 5 MG TABLET: 5 | 90 days supply | Qty: 90 | Fill #0

## 2017-09-04 MED FILL — LUMIGAN 0.01% EYE DROPS: 0.01 | 30 days supply | Qty: 3 | Fill #3

## 2017-09-04 MED FILL — COMBIGAN EYE DROPS: 0.2-0.5 | 50 days supply | Qty: 5 | Fill #1

## 2017-09-04 MED FILL — TRESIBA FLEXTOUCH 100 UNITS: 100 | 25 days supply | Qty: 15 | Fill #3

## 2017-09-05 MED FILL — BROMOCRIPTINE 2.5 MG TABLET: 2.5 | 30 days supply | Qty: 15 | Fill #5

## 2017-09-08 ENCOUNTER — Other Ambulatory Visit: Payer: Self-pay | Admitting: Internal Medicine

## 2017-09-08 DIAGNOSIS — Z1231 Encounter for screening mammogram for malignant neoplasm of breast: Secondary | ICD-10-CM | POA: Diagnosis not present

## 2017-09-08 DIAGNOSIS — A6000 Herpesviral infection of urogenital system, unspecified: Secondary | ICD-10-CM | POA: Diagnosis not present

## 2017-09-08 DIAGNOSIS — Z01411 Encounter for gynecological examination (general) (routine) with abnormal findings: Secondary | ICD-10-CM | POA: Diagnosis not present

## 2017-09-08 DIAGNOSIS — L293 Anogenital pruritus, unspecified: Secondary | ICD-10-CM | POA: Diagnosis not present

## 2017-09-08 MED FILL — FLUCONAZOLE 150 MG TABLET: 150 | 1 days supply | Qty: 1 | Fill #2

## 2017-09-08 MED FILL — BENZONATATE 200 MG CAPSULE: 200 | 23 days supply | Qty: 45 | Fill #0

## 2017-09-08 MED FILL — VALACYCLOVIR HCL 500 MG TAB: 500 | 30 days supply | Qty: 30 | Fill #0

## 2017-09-12 MED FILL — FLUCONAZOLE 150 MG TABLET: 150 | 3 days supply | Qty: 3 | Fill #0

## 2017-09-14 ENCOUNTER — Telehealth: Payer: Self-pay | Admitting: Internal Medicine

## 2017-09-14 NOTE — Telephone Encounter (Signed)
Called and informed patient she needed to set up an appointment to be seen to have her FMLA renewed. She was very upset over that. She stated " Why, WHY WHY?, Nothing has changed, OMGAH I am so sick of her." I explained her to her she needed to FU with her PCP about her issues to have the paperwork renewed. She has set up an appointment for Monday 1/21.

## 2017-09-14 NOTE — Telephone Encounter (Signed)
Needs appointment

## 2017-09-14 NOTE — Telephone Encounter (Signed)
I have received FMLA to be renewed for patient. Will the patient need an appointment. You last saw her on 07/24/17.  Do you approve, would you like for me to fill them out?

## 2017-09-14 NOTE — Telephone Encounter (Signed)
30 minute visit please

## 2017-09-14 NOTE — Telephone Encounter (Signed)
I did a 30 min.

## 2017-09-18 ENCOUNTER — Ambulatory Visit: Payer: 59 | Admitting: Internal Medicine

## 2017-09-18 ENCOUNTER — Encounter: Payer: Self-pay | Admitting: Internal Medicine

## 2017-09-18 VITALS — BP 128/80 | HR 87 | Temp 98.3°F | Ht 65.0 in | Wt 255.0 lb

## 2017-09-18 DIAGNOSIS — M5412 Radiculopathy, cervical region: Secondary | ICD-10-CM | POA: Diagnosis not present

## 2017-09-18 DIAGNOSIS — R52 Pain, unspecified: Secondary | ICD-10-CM

## 2017-09-18 DIAGNOSIS — Z0279 Encounter for issue of other medical certificate: Secondary | ICD-10-CM

## 2017-09-18 DIAGNOSIS — R29898 Other symptoms and signs involving the musculoskeletal system: Secondary | ICD-10-CM

## 2017-09-18 DIAGNOSIS — G8929 Other chronic pain: Secondary | ICD-10-CM

## 2017-09-18 NOTE — Assessment & Plan Note (Signed)
In the neck region and left as well as right. More radiculopathy on the right. Needs MRI cervical to check for nerve impingement. She denies injury recently. Depending on findings may need referral to neurosurgery. She is still taking elavil and lyrica for the pain without relief.

## 2017-09-18 NOTE — Patient Instructions (Addendum)
We will get the MRI of the neck to check the nerves for any new problems.   We will get the FMLA papers filled out.

## 2017-09-18 NOTE — Assessment & Plan Note (Signed)
Concern for TIA versus stroke. Checking MRI brain to rule out underlying pathology.

## 2017-09-18 NOTE — Assessment & Plan Note (Signed)
She is taking elavil and lyrica for this pain and overall it is stable. She is still in need of her FMLA as she is not able to perform her job when the pain is severe. Sometimes lyrica does not help and taking it as prescribed can make her too sleepy to do her job. Will fill out her FMLA papers at this time. Needs ongoing follow up of her condition for worsening at this time.

## 2017-09-18 NOTE — Telephone Encounter (Signed)
Forms have been completed &signed, copy sent to scan, &charged.  LVM to inform patient, copy mailed to patient.

## 2017-09-18 NOTE — Progress Notes (Signed)
   Subjective:    Patient ID: Bridget [name], female    DOB: 1961-08-03, 57 y.o.   MRN: [id number]  HPI The patient is a 57 YO female coming in for FMLA renewal. She is on FMLA for neuropathy pain. She has been prescribed lyrica for the pain. She has been filling the lyrica 150 mg BID since our last visit and she feels it is not working anymore. She is also having worsening of her symptoms. More tingling and numbness on the right side. She was concerned about having a stroke at one point but did not seek care. She is also now having episodes of weakness and numbness of her right hand. She is dropping things and is not able to do some things with the right hand (her dominant hand). She is having more neck pain as well. The lyrica is not helping at all. She is concerned about these changes. She has occasionally had speech changes during the episodes of weakness but again never sought care for these episodes. Last 30-60 minutes.   Review of Systems  Constitutional: Positive for activity change. Negative for appetite change, chills, fatigue, fever and unexpected weight change.  HENT: Negative.   Eyes: Negative.   Respiratory: Negative for cough, chest tightness and shortness of breath.   Cardiovascular: Negative for chest pain, palpitations and leg swelling.  Gastrointestinal: Negative for abdominal distention, abdominal pain, constipation, diarrhea, nausea and vomiting.  Musculoskeletal: Positive for arthralgias, gait problem, myalgias and neck pain.  Skin: Negative.   Neurological: Positive for speech difficulty, weakness and numbness. Negative for dizziness, light-headedness and headaches.  Psychiatric/Behavioral: Negative.       Objective:   Physical Exam  Constitutional: She is oriented to person, place, and time. She appears well-developed and well-nourished.  Overweight  HENT:  Head: Normocephalic and atraumatic.  Eyes: EOM are normal.  Neck: Normal range of motion.  Cardiovascular:  Normal rate and regular rhythm.  Pulmonary/Chest: Effort normal and breath sounds normal. No respiratory distress. She has no wheezes. She has no rales.  Abdominal: Soft. Bowel sounds are normal. She exhibits no distension. There is no tenderness. There is no rebound.  Musculoskeletal: She exhibits no edema.  Neurological: She is alert and oriented to person, place, and time. A cranial nerve deficit is present. Coordination normal.  Some mild weakness right hand greater than left, some change in sensation in the right side upper and lower compared to left side.   Skin: Skin is warm and dry.  Psychiatric: She has a normal mood and affect.   Vitals:   09/18/17 0929  BP: 128/80  Pulse: 87  Temp: 98.3 F (36.8 C)  TempSrc: Oral  SpO2: 100%  Weight: 255 lb (115.7 kg)  Height: 5\' 5"  (1.651 m)      Assessment & Plan:

## 2017-09-22 ENCOUNTER — Ambulatory Visit: Payer: 59 | Admitting: Endocrinology

## 2017-09-22 ENCOUNTER — Encounter: Payer: Self-pay | Admitting: Endocrinology

## 2017-09-22 VITALS — BP 138/88 | HR 78 | Wt 254.8 lb

## 2017-09-22 DIAGNOSIS — IMO0002 Reserved for concepts with insufficient information to code with codable children: Secondary | ICD-10-CM

## 2017-09-22 DIAGNOSIS — E118 Type 2 diabetes mellitus with unspecified complications: Secondary | ICD-10-CM | POA: Diagnosis not present

## 2017-09-22 DIAGNOSIS — E1165 Type 2 diabetes mellitus with hyperglycemia: Secondary | ICD-10-CM | POA: Diagnosis not present

## 2017-09-22 LAB — POCT GLYCOSYLATED HEMOGLOBIN (HGB A1C): Hemoglobin A1C: 8.3

## 2017-09-22 NOTE — Patient Instructions (Addendum)
On this type of insulin schedule, you should eat meals on a regular schedule.  If a meal is missed or significantly delayed, your blood sugar could go low.   Please continue the same insulin.  check your blood sugar once a day.  vary the time of day when you check, between before the 3 meals, and at bedtime.  also check if you have symptoms of your blood sugar being too high or too low.  please keep a record of the readings and bring it to your next appointment here (or you can bring the meter itself).  You can write it on any piece of paper.  please call us sooner if your blood sugar goes below 70, or if you have a lot of readings over 200.  With the [place], if you miss it, you can still take the full amount 12 hours later.   Please come back for a follow-up appointment in 2 months.

## 2017-09-22 NOTE — Progress Notes (Signed)
Subjective:    Patient ID: Bridget [name], female    DOB: 14-Oct-1960, 57 y.o.   MRN: [id number]  HPI Pt returns for f/u of diabetes mellitus: DM type: Insulin-requiring type 2.  Dx'ed: 7412 Complications: polyneuropathy.   Therapy: insulin + 2 oral meds.   GDM: never.  DKA: never.   Severe hypoglycemia: never.  Pancreatitis: never.  Other: she did not tolerate invokana (vaginitis) or metformin-XR (diarrhea); edema precludes pioglitizone rx.  she took insulin from 2000-2003; she resumed in 2017, She declines multiple daily injections; she works 3rd shift.  Interval history: 6 weeks ago, she stopped the tresiba x 3 weeks.  She says this is due to cost, despite copay of just $25.  she brings her phone with her cbg's which I have reviewed today.  It varies from 80-200's.   Past Medical History:  Diagnosis Date  . Bacterial vaginosis 12/07/2010  . Depression   . Diabetes mellitus without complication ([place])   . Diverticulosis   . DIVERTICULOSIS, COLON 10/26/2006  . Fibromyalgia   . GERD (gastroesophageal reflux disease)   . Glaucoma   . Headache   . HEEL SPUR 09/10/2007   Qualifier: Diagnosis of  By: Drue Flirt  MD, Merrily Brittle    . Hypertension   . MALAISE AND FATIGUE 05/27/2010  . Obesity   . OBESITY, NOS 10/26/2006  . Osteoarthritis   . OSTEOARTHRITIS, KNEE 04/27/2010  . Retained tampon 05/21/2012  . SHOULDER PAIN, LEFT 05/19/2010   Qualifier: Diagnosis of  By: Annamary Carolin MD, Amber    . Vaginal yeast infection 12/08/2010    Past Surgical History:  Procedure Laterality Date  . ABDOMINAL HYSTERECTOMY  1990  . ABDOMINAL HYSTERECTOMY    . BLADDER SURGERY  2001  . CATARACT EXTRACTION Right 2001   with implant  . EYE SURGERY     implant rt eye  . ROTATOR CUFF REPAIR Right   . SHOULDER ARTHROSCOPY WITH SUBACROMIAL DECOMPRESSION Left 07/20/2015   Procedure: SHOULDER ARTHROSCOPY WITH DEBRIDEMENT OF ROTATOR CUFF AND SUBACROMIAL DECOMPRESSION ;  Surgeon: Tania Ade, MD;  Location: [facility];  Service: Orthopedics;  Laterality: Left;  Left shoulder arthroscopy with debridement of rotator cuff and subacromial decompression    Social History   Socioeconomic History  . Marital status: Divorced    Spouse name: Not on file  . Number of children: 2  . Years of education: Not on file  . Highest education level: Not on file  Social Needs  . Financial resource strain: Not on file  . Food insecurity - worry: Not on file  . Food insecurity - inability: Not on file  . Transportation needs - medical: Not on file  . Transportation needs - non-medical: Not on file  Occupational History  . Occupation: PBX IT sales professional: [employer]  Tobacco Use  . Smoking status: Former Smoker    Packs/day: 0.30    Types: Cigarettes    Last attempt to quit: 08/29/1998    Years since quitting: 19.0  . Smokeless tobacco: Never Used  . Tobacco comment: Quit in 2000   Substance and Sexual Activity  . Alcohol use: Yes    Comment: socially  . Drug use: No  . Sexual activity: Yes    Birth control/protection: Surgical  Other Topics Concern  . Not on file  Social History Narrative   Works at [employer] as a Museum/gallery conservator    Current Outpatient Medications on File Prior to Visit  Medication Sig Dispense  Refill  . Alcohol Swabs (ALCOHOL PREPS) PADS 1 Device by Does not apply route 4 (four) times daily -  before meals and at bedtime. 100 each 11  . amitriptyline (ELAVIL) 25 MG tablet Take 0.5 tablets (12.5 mg total) by mouth at bedtime. 30 tablet 2  . aspirin 81 MG tablet Take 1 tablet (81 mg total) by mouth daily.    . benzonatate (TESSALON) 200 MG capsule TAKE 1 CAPSULE (200 MG TOTAL) BY MOUTH TWICE A DAY AS NEEDED FOR COUGH 45 capsule 0  . Blood Glucose Monitoring Suppl (ACCU-CHEK GUIDE) w/Device KIT 2 each by Does not apply route daily. 1 kit 2  . bromocriptine (PARLODEL) 2.5 MG tablet TAKE 1/2 TABLET BY MOUTH ONCE DAILY 15 tablet 11  . cetirizine (ZYRTEC) 10 MG tablet  Take 1 tablet (10 mg total) by mouth daily. 30 tablet 11  . colesevelam (WELCHOL) 625 MG tablet Take 2 tablets (1,250 mg total) by mouth 2 (two) times daily with a meal. 60 tablet 11  . Continuous Blood Gluc Sensor (FREESTYLE LIBRE SENSOR SYSTEM) MISC 1 Device by Does not apply route once a week. 12 each 3  . Cranberry-Vitamin C-Probiotic (AZO CRANBERRY PO) Take by mouth daily. Reported on 10/13/2015    . cyclobenzaprine (FLEXERIL) 5 MG tablet Take 1 tablet (5 mg total) by mouth 3 (three) times daily as needed for muscle spasms. Needs visit for refills 90 tablet 0  . diclofenac sodium (VOLTAREN) 1 % GEL Apply 2 g topically 4 (four) times daily. Rub into affected area of foot 2 to 4 times daily 100 g 2  . docusate sodium (COLACE) 100 MG capsule Take 1 capsule (100 mg total) by mouth 3 (three) times daily as needed. 20 capsule 0  . fluticasone (FLONASE) 50 MCG/ACT nasal spray Place 2 sprays into both nostrils daily. 16 g 0  . glucose blood (ACCU-CHEK GUIDE) test strip Use to check blood sugar 2 times per day. Dx code: E11.9 200 each 2  . guaiFENesin (MUCINEX) 600 MG 12 hr tablet Take 1 tablet (600 mg total) by mouth 2 (two) times daily as needed for cough or to loosen phlegm. 14 tablet 0  . insulin degludec (TRESIBA FLEXTOUCH) 100 UNIT/ML SOPN FlexTouch Pen Inject 0.6 mLs (60 Units total) into the skin daily. And pen needles 1/day 10 pen 11  . insulin lispro (HUMALOG KWIKPEN) 100 UNIT/ML KiwkPen Inject 0.15 mLs (15 Units total) into the skin daily. With the biggest meal, and pen needles 2/day 15 mL 11  . Insulin Pen Needle 31G X 5 MM MISC Used for insulin injections 3x daily. 100 each 8  . Lancets (ACCU-CHEK MULTICLIX) lancets Use to check blood sugar 2 times per day. Dx code E11.9 200 each 2  . lisinopril (PRINIVIL,ZESTRIL) 5 MG tablet TAKE 1 TABLET BY MOUTH DAILY. 30 tablet 5  . NALTREXONE HCL PO Take 3 mg by mouth daily.    . NONFORMULARY OR COMPOUNDED ITEM Take 1 tablet by mouth at bedtime. Reported  on 03/08/2016    . NONFORMULARY OR COMPOUNDED ITEM [facility]: Achillies Tendonitis Cream - Diclofenac 3%, Baclofen 2%, Bupivacaine 1%, Gabapentin 6%, Ibuprofen 3%, Pentoxifylline 3%, apply 1-2 gram to feet 3-4 times daily. 120 each 0  . ondansetron (ZOFRAN ODT) 8 MG disintegrating tablet Take 1 tablet (8 mg total) by mouth every 8 (eight) hours as needed for nausea or vomiting. 20 tablet 0  . OVER THE COUNTER MEDICATION 1 capsule daily.     Marland Kitchen oxymetazoline (AFRIN  NASAL SPRAY) 0.05 % nasal spray Place 1 spray into both nostrils 2 (two) times daily. Use only for 3days, then stop 30 mL 0  . pantoprazole (PROTONIX) 20 MG tablet TAKE 1 TABLET BY MOUTH DAILY. 30 tablet PRN  . pregabalin (LYRICA) 150 MG capsule Take 1 capsule (150 mg total) by mouth 2 (two) times daily. Needs visit for more refill 60 capsule 5  . pregabalin (LYRICA) 50 MG capsule Take 1 capsule (50 mg total) by mouth 2 (two) times daily. 60 capsule 0  . sodium chloride (OCEAN) 0.65 % SOLN nasal spray Place 1 spray into both nostrils as needed for congestion. 15 mL 0  . traMADol (ULTRAM) 50 MG tablet Take 1 tablet (50 mg total) by mouth every 6 (six) hours as needed. 15 tablet 0  . triamcinolone (NASACORT AQ) 55 MCG/ACT AERO nasal inhaler Place 2 sprays into the nose daily. 1 Inhaler 12  . TRUEPLUS LANCETS 28G MISC Use to help check blood sugars twice a day Dx E11.9 100 each 3  . TRUETEST TEST test strip USE AS INSTRUCTED 100 each 11   No current facility-administered medications on file prior to visit.     Allergies  Allergen Reactions  . Citrus Itching  . Invokana [Canagliflozin] Other (See Comments)    Caused frequent vaginal yeast infections  . Codeine Hives  . Metformin And Related Diarrhea and Nausea And Vomiting  . Metoclopramide Hcl Nausea And Vomiting  . Penicillins Nausea And Vomiting  . Propoxyphene N-Acetaminophen Nausea And Vomiting  . Sulfonamide Derivatives Hives    Family History  Problem Relation Age  of Onset  . Breast cancer Mother   . Diabetes Mother   . Ovarian cancer Maternal Grandmother   . Heart disease Maternal Grandmother        great  . Stomach cancer Cousin   . Diabetes Brother        x 3    BP 138/88 (BP Location: Right Arm, Patient Position: Sitting, Cuff Size: Normal)   Pulse 78   Wt 254 lb 12.8 oz (115.6 kg)   SpO2 97%   BMI 42.40 kg/m   Review of Systems She denies hypoglycemia    Objective:   Physical Exam VITAL SIGNS:  See vs page GENERAL: no distress Pulses: dorsalis pedis intact bilat.   MSK: no deformity of the feet CV: no leg edema Skin:  no ulcer on the feet.  normal color and temp on the feet. Neuro: sensation is intact to touch on the feet  Lab Results  Component Value Date   HGBA1C 8.3 09/22/2017      Assessment & Plan:  Insulin-requiring type 2 DM, with polyneuropathy: worse Noncompliance with insulin: I gave pt $15 copay card  Patient Instructions  On this type of insulin schedule, you should eat meals on a regular schedule.  If a meal is missed or significantly delayed, your blood sugar could go low.   Please continue the same insulin.  check your blood sugar once a day.  vary the time of day when you check, between before the 3 meals, and at bedtime.  also check if you have symptoms of your blood sugar being too high or too low.  please keep a record of the readings and bring it to your next appointment here (or you can bring the meter itself).  You can write it on any piece of paper.  please call us sooner if your blood sugar goes below 70, or if you have  a lot of readings over 200.  With the [place], if you miss it, you can still take the full amount 12 hours later.   Please come back for a follow-up appointment in 2 months.

## 2017-10-02 MED FILL — TRESIBA FLEXTOUCH 100 UNITS: 100 | 25 days supply | Qty: 15 | Fill #4

## 2017-10-02 MED FILL — LYRICA 150 MG CAPSULE: 150 | 30 days supply | Qty: 60 | Fill #0

## 2017-10-02 MED FILL — ACCU-CHEK FASTCLIX LANCETS: 88 days supply | Qty: 204 | Fill #2

## 2017-10-03 MED FILL — UNIFINE PENTIPS 31GX3/16": 31G X 5 MM | 33 days supply | Qty: 100 | Fill #3

## 2017-10-03 MED FILL — UNIFINE PENTIPS 31GX3/16: 31G X 5 MM | 33 days supply | Qty: 100 | Fill #3

## 2017-10-10 ENCOUNTER — Encounter: Payer: Self-pay | Admitting: Internal Medicine

## 2017-10-13 MED FILL — BROMOCRIPTINE 2.5 MG TABLET: 2.5 | 30 days supply | Qty: 15 | Fill #6

## 2017-10-17 ENCOUNTER — Telehealth: Payer: Self-pay | Admitting: Endocrinology

## 2017-10-17 NOTE — Telephone Encounter (Signed)
Kariel from [employer] called ph# [phone]. 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

## 2017-10-19 NOTE — Telephone Encounter (Signed)
PA has been submitted.

## 2017-10-21 ENCOUNTER — Ambulatory Visit
Admission: RE | Admit: 2017-10-21 | Discharge: 2017-10-21 | Disposition: A | Discharge status: Home / Self Care | Payer: 59 | Source: Ambulatory Visit | Attending: Internal Medicine | Admitting: Internal Medicine

## 2017-10-21 DIAGNOSIS — R202 Paresthesia of skin: Secondary | ICD-10-CM | POA: Diagnosis not present

## 2017-10-21 DIAGNOSIS — M5412 Radiculopathy, cervical region: Secondary | ICD-10-CM

## 2017-10-21 DIAGNOSIS — M50221 Other cervical disc displacement at C4-C5 level: Secondary | ICD-10-CM | POA: Diagnosis not present

## 2017-10-21 DIAGNOSIS — M50223 Other cervical disc displacement at C6-C7 level: Secondary | ICD-10-CM | POA: Diagnosis not present

## 2017-10-25 ENCOUNTER — Telehealth: Payer: Self-pay

## 2017-10-25 DIAGNOSIS — G8929 Other chronic pain: Secondary | ICD-10-CM

## 2017-10-25 DIAGNOSIS — M549 Dorsalgia, unspecified: Principal | ICD-10-CM

## 2017-10-25 NOTE — Telephone Encounter (Signed)
Patient called stating that she was interested in getting a referral to a back specialist.

## 2017-10-26 NOTE — Telephone Encounter (Signed)
Patient informed. 

## 2017-10-26 NOTE — Telephone Encounter (Signed)
Placed.

## 2017-11-02 MED FILL — UNIFINE PENTIPS 31GX3/16: 31G X 5 MM | 33 days supply | Qty: 100 | Fill #4

## 2017-11-02 MED FILL — HUMALOG 100 UNITS/ML KWIKPE: 100 | 90 days supply | Qty: 15 | Fill #1

## 2017-11-02 MED FILL — TRESIBA FLEXTOUCH 100 UNITS: 100 | 25 days supply | Qty: 15 | Fill #5

## 2017-11-02 MED FILL — UNIFINE PENTIPS 31GX3/16": 31G X 5 MM | 33 days supply | Qty: 100 | Fill #4

## 2017-11-02 MED FILL — LUMIGAN 0.01% EYE DROPS: 0.01 | 30 days supply | Qty: 3 | Fill #4

## 2017-11-03 MED FILL — LYRICA 150 MG CAPSULE: 150 | 30 days supply | Qty: 60 | Fill #1

## 2017-11-03 MED FILL — COMBIGAN EYE DROPS: 0.2-0.5 | 50 days supply | Qty: 5 | Fill #2

## 2017-11-08 DIAGNOSIS — Z6841 Body Mass Index (BMI) 40.0 and over, adult: Secondary | ICD-10-CM | POA: Diagnosis not present

## 2017-11-08 DIAGNOSIS — M542 Cervicalgia: Secondary | ICD-10-CM | POA: Diagnosis not present

## 2017-11-08 DIAGNOSIS — M5020 Other cervical disc displacement, unspecified cervical region: Secondary | ICD-10-CM | POA: Diagnosis not present

## 2017-11-10 DIAGNOSIS — H401122 Primary open-angle glaucoma, left eye, moderate stage: Secondary | ICD-10-CM | POA: Diagnosis not present

## 2017-11-10 DIAGNOSIS — H401111 Primary open-angle glaucoma, right eye, mild stage: Secondary | ICD-10-CM | POA: Diagnosis not present

## 2017-11-13 MED FILL — BROMOCRIPTINE 2.5 MG TABLET: 2.5 | 30 days supply | Qty: 15 | Fill #7

## 2017-11-20 ENCOUNTER — Encounter: Payer: Self-pay | Admitting: Endocrinology

## 2017-11-20 ENCOUNTER — Ambulatory Visit: Payer: 59 | Admitting: Endocrinology

## 2017-11-20 VITALS — BP 140/75 | HR 81 | Wt 261.4 lb

## 2017-11-20 DIAGNOSIS — E118 Type 2 diabetes mellitus with unspecified complications: Secondary | ICD-10-CM

## 2017-11-20 DIAGNOSIS — E1165 Type 2 diabetes mellitus with hyperglycemia: Secondary | ICD-10-CM | POA: Diagnosis not present

## 2017-11-20 DIAGNOSIS — IMO0002 Reserved for concepts with insufficient information to code with codable children: Secondary | ICD-10-CM

## 2017-11-20 LAB — POCT GLYCOSYLATED HEMOGLOBIN (HGB A1C): Hemoglobin A1C: 7.6

## 2017-11-20 NOTE — Progress Notes (Signed)
Subjective:    Patient ID: Bridget [name], female    DOB: 26-May-1961, 57 y.o.   MRN: [id number]  HPI Pt returns for f/u of diabetes mellitus: DM type: Insulin-requiring type 2.  Dx'ed: 2863 Complications: polyneuropathy.   Therapy: insulin + 2 oral meds.   GDM: never.  DKA: never.   Severe hypoglycemia: never.  Pancreatitis: never.  Other: she did not tolerate invokana (vaginitis) or metformin-XR (diarrhea); edema precludes pioglitizone rx.  she took insulin from 2000-2003; she resumed in 2017, She declines multiple daily injections; she works 3rd shift.  Interval history: no cbg record, but states cbg's are well-controlled.  She says she never misses the tresiba.  pt states she feels well in general. Past Medical History:  Diagnosis Date  . Bacterial vaginosis 12/07/2010  . Depression   . Diabetes mellitus without complication ([place])   . Diverticulosis   . DIVERTICULOSIS, COLON 10/26/2006  . Fibromyalgia   . GERD (gastroesophageal reflux disease)   . Glaucoma   . Headache   . HEEL SPUR 09/10/2007   Qualifier: Diagnosis of  By: Drue Flirt  MD, Merrily Brittle    . Hypertension   . MALAISE AND FATIGUE 05/27/2010  . Obesity   . OBESITY, NOS 10/26/2006  . Osteoarthritis   . OSTEOARTHRITIS, KNEE 04/27/2010  . Retained tampon 05/21/2012  . SHOULDER PAIN, LEFT 05/19/2010   Qualifier: Diagnosis of  By: Annamary Carolin MD, Amber    . Vaginal yeast infection 12/08/2010    Past Surgical History:  Procedure Laterality Date  . ABDOMINAL HYSTERECTOMY  1990  . ABDOMINAL HYSTERECTOMY    . BLADDER SURGERY  2001  . CATARACT EXTRACTION Right 2001   with implant  . EYE SURGERY     implant rt eye  . ROTATOR CUFF REPAIR Right   . SHOULDER ARTHROSCOPY WITH SUBACROMIAL DECOMPRESSION Left 07/20/2015   Procedure: SHOULDER ARTHROSCOPY WITH DEBRIDEMENT OF ROTATOR CUFF AND SUBACROMIAL DECOMPRESSION ;  Surgeon: Tania Ade, MD;  Location: [facility];  Service: Orthopedics;  Laterality: Left;  Left  shoulder arthroscopy with debridement of rotator cuff and subacromial decompression    Social History   Socioeconomic History  . Marital status: Divorced    Spouse name: Not on file  . Number of children: 2  . Years of education: Not on file  . Highest education level: Not on file  Occupational History  . Occupation: Radio producer: [employer]  . Financial resource strain: Not on file  . Food insecurity:    Worry: Not on file    Inability: Not on file  . Transportation needs:    Medical: Not on file    Non-medical: Not on file  Tobacco Use  . Smoking status: Former Smoker    Packs/day: 0.30    Types: Cigarettes    Last attempt to quit: 08/29/1998    Years since quitting: 19.2  . Smokeless tobacco: Never Used  . Tobacco comment: Quit in 2000   Substance and Sexual Activity  . Alcohol use: Yes    Comment: socially  . Drug use: No  . Sexual activity: Yes    Birth control/protection: Surgical  Lifestyle  . Physical activity:    Days per week: Not on file    Minutes per session: Not on file  . Stress: Not on file  Relationships  . Social connections:    Talks on phone: Not on file    Gets together: Not on file  Attends religious service: Not on file    Active member of club or organization: Not on file    Attends meetings of clubs or organizations: Not on file    Relationship status: Not on file  . Intimate partner violence:    Fear of current or ex partner: Not on file    Emotionally abused: Not on file    Physically abused: Not on file    Forced sexual activity: Not on file  Other Topics Concern  . Not on file  Social History Narrative   Works at [employer] as a Museum/gallery conservator    Current Outpatient Medications on File Prior to Visit  Medication Sig Dispense Refill  . Alcohol Swabs (ALCOHOL PREPS) PADS 1 Device by Does not apply route 4 (four) times daily -  before meals and at bedtime. 100 each 11  . amitriptyline (ELAVIL) 25 MG  tablet Take 0.5 tablets (12.5 mg total) by mouth at bedtime. 30 tablet 2  . aspirin 81 MG tablet Take 1 tablet (81 mg total) by mouth daily.    . benzonatate (TESSALON) 200 MG capsule TAKE 1 CAPSULE (200 MG TOTAL) BY MOUTH TWICE A DAY AS NEEDED FOR COUGH 45 capsule 0  . Blood Glucose Monitoring Suppl (ACCU-CHEK GUIDE) w/Device KIT 2 each by Does not apply route daily. 1 kit 2  . bromocriptine (PARLODEL) 2.5 MG tablet TAKE 1/2 TABLET BY MOUTH ONCE DAILY 15 tablet 11  . cetirizine (ZYRTEC) 10 MG tablet Take 1 tablet (10 mg total) by mouth daily. 30 tablet 11  . colesevelam (WELCHOL) 625 MG tablet Take 2 tablets (1,250 mg total) by mouth 2 (two) times daily with a meal. 60 tablet 11  . Continuous Blood Gluc Sensor (FREESTYLE LIBRE SENSOR SYSTEM) MISC 1 Device by Does not apply route once a week. 12 each 3  . Cranberry-Vitamin C-Probiotic (AZO CRANBERRY PO) Take by mouth daily. Reported on 10/13/2015    . cyclobenzaprine (FLEXERIL) 5 MG tablet Take 1 tablet (5 mg total) by mouth 3 (three) times daily as needed for muscle spasms. Needs visit for refills 90 tablet 0  . diclofenac sodium (VOLTAREN) 1 % GEL Apply 2 g topically 4 (four) times daily. Rub into affected area of foot 2 to 4 times daily 100 g 2  . docusate sodium (COLACE) 100 MG capsule Take 1 capsule (100 mg total) by mouth 3 (three) times daily as needed. 20 capsule 0  . fluticasone (FLONASE) 50 MCG/ACT nasal spray Place 2 sprays into both nostrils daily. 16 g 0  . glucose blood (ACCU-CHEK GUIDE) test strip Use to check blood sugar 2 times per day. Dx code: E11.9 200 each 2  . guaiFENesin (MUCINEX) 600 MG 12 hr tablet Take 1 tablet (600 mg total) by mouth 2 (two) times daily as needed for cough or to loosen phlegm. 14 tablet 0  . insulin degludec (TRESIBA FLEXTOUCH) 100 UNIT/ML SOPN FlexTouch Pen Inject 0.6 mLs (60 Units total) into the skin daily. And pen needles 1/day 10 pen 11  . insulin lispro (HUMALOG KWIKPEN) 100 UNIT/ML KiwkPen Inject 0.15  mLs (15 Units total) into the skin daily. With the biggest meal, and pen needles 2/day 15 mL 11  . Insulin Pen Needle 31G X 5 MM MISC Used for insulin injections 3x daily. 100 each 8  . Lancets (ACCU-CHEK MULTICLIX) lancets Use to check blood sugar 2 times per day. Dx code E11.9 200 each 2  . lisinopril (PRINIVIL,ZESTRIL) 5 MG tablet TAKE 1 TABLET BY  MOUTH DAILY. 30 tablet 5  . NALTREXONE HCL PO Take 3 mg by mouth daily.    . NONFORMULARY OR COMPOUNDED ITEM Take 1 tablet by mouth at bedtime. Reported on 03/08/2016    . NONFORMULARY OR COMPOUNDED ITEM [facility]: Achillies Tendonitis Cream - Diclofenac 3%, Baclofen 2%, Bupivacaine 1%, Gabapentin 6%, Ibuprofen 3%, Pentoxifylline 3%, apply 1-2 gram to feet 3-4 times daily. 120 each 0  . ondansetron (ZOFRAN ODT) 8 MG disintegrating tablet Take 1 tablet (8 mg total) by mouth every 8 (eight) hours as needed for nausea or vomiting. 20 tablet 0  . OVER THE COUNTER MEDICATION 1 capsule daily.     Marland Kitchen oxymetazoline (AFRIN NASAL SPRAY) 0.05 % nasal spray Place 1 spray into both nostrils 2 (two) times daily. Use only for 3days, then stop 30 mL 0  . pantoprazole (PROTONIX) 20 MG tablet TAKE 1 TABLET BY MOUTH DAILY. 30 tablet PRN  . pregabalin (LYRICA) 150 MG capsule Take 1 capsule (150 mg total) by mouth 2 (two) times daily. Needs visit for more refill 60 capsule 5  . pregabalin (LYRICA) 50 MG capsule Take 1 capsule (50 mg total) by mouth 2 (two) times daily. 60 capsule 0  . sodium chloride (OCEAN) 0.65 % SOLN nasal spray Place 1 spray into both nostrils as needed for congestion. 15 mL 0  . traMADol (ULTRAM) 50 MG tablet Take 1 tablet (50 mg total) by mouth every 6 (six) hours as needed. 15 tablet 0  . triamcinolone (NASACORT AQ) 55 MCG/ACT AERO nasal inhaler Place 2 sprays into the nose daily. 1 Inhaler 12  . TRUEPLUS LANCETS 28G MISC Use to help check blood sugars twice a day Dx E11.9 100 each 3  . TRUETEST TEST test strip USE AS INSTRUCTED 100 each 11    No current facility-administered medications on file prior to visit.     Allergies  Allergen Reactions  . Citrus Itching  . Invokana [Canagliflozin] Other (See Comments)    Caused frequent vaginal yeast infections  . Codeine Hives  . Metformin And Related Diarrhea and Nausea And Vomiting  . Metoclopramide Hcl Nausea And Vomiting  . Penicillins Nausea And Vomiting  . Propoxyphene N-Acetaminophen Nausea And Vomiting  . Sulfonamide Derivatives Hives    Family History  Problem Relation Age of Onset  . Breast cancer Mother   . Diabetes Mother   . Ovarian cancer Maternal Grandmother   . Heart disease Maternal Grandmother        great  . Stomach cancer Cousin   . Diabetes Brother        x 3    BP 140/75 (BP Location: Left Wrist, Patient Position: Sitting, Cuff Size: Normal)   Pulse 81   Wt 261 lb 6.4 oz (118.6 kg)   SpO2 96%   BMI 43.50 kg/m    Review of Systems She denies hypoglycemia    Objective:   Physical Exam VITAL SIGNS:  See vs page GENERAL: no distress Pulses: foot pulses are intact bilaterally.   MSK: no deformity of the feet or ankles.  CV: no edema of the legs or ankles Skin:  no ulcer on the feet or ankles.  normal color and temp on the feet and ankles Neuro: sensation is intact to touch on the feet and ankles.     Lab Results  Component Value Date   HGBA1C 7.6 11/20/2017      Assessment & Plan:  Insulin-requiring type 2 DM: this is the best control this pt should  aim for, given this regimen, which does match insulin to her changing needs throughout the day HTN: is noted today  Patient Instructions  On this type of insulin schedule, you should eat meals on a regular schedule.  If a meal is missed or significantly delayed, your blood sugar could go low.   Please continue the same medications for diabetes.   Your blood pressure is high today.  Please see your primary care provider soon, to have it rechecked.  check your blood sugar once a day.   vary the time of day when you check, between before the 3 meals, and at bedtime.  also check if you have symptoms of your blood sugar being too high or too low.  please keep a record of the readings and bring it to your next appointment here (or you can bring the meter itself).  You can write it on any piece of paper.  please call us sooner if your blood sugar goes below 70, or if you have a lot of readings over 200.  With the [place], if you miss it, you can still take the full amount 12 hours later.   Please come back for a follow-up appointment in 4 months.

## 2017-11-20 NOTE — Patient Instructions (Addendum)
On this type of insulin schedule, you should eat meals on a regular schedule.  If a meal is missed or significantly delayed, your blood sugar could go low.   Please continue the same medications for diabetes.   Your blood pressure is high today.  Please see your primary care provider soon, to have it rechecked.  check your blood sugar once a day.  vary the time of day when you check, between before the 3 meals, and at bedtime.  also check if you have symptoms of your blood sugar being too high or too low.  please keep a record of the readings and bring it to your next appointment here (or you can bring the meter itself).  You can write it on any piece of paper.  please call us sooner if your blood sugar goes below 70, or if you have a lot of readings over 200.  With the [place], if you miss it, you can still take the full amount 12 hours later.   Please come back for a follow-up appointment in 4 months.

## 2017-11-27 ENCOUNTER — Ambulatory Visit: Payer: 59 | Attending: Neurosurgery | Admitting: Physical Therapy

## 2017-11-27 ENCOUNTER — Other Ambulatory Visit: Payer: Self-pay

## 2017-11-27 ENCOUNTER — Encounter: Payer: Self-pay | Admitting: Physical Therapy

## 2017-11-27 DIAGNOSIS — M542 Cervicalgia: Secondary | ICD-10-CM | POA: Diagnosis not present

## 2017-11-27 DIAGNOSIS — M6281 Muscle weakness (generalized): Secondary | ICD-10-CM | POA: Insufficient documentation

## 2017-11-28 ENCOUNTER — Encounter: Payer: Self-pay | Admitting: Physical Therapy

## 2017-11-28 NOTE — Therapy (Addendum)
[facility] [address] [place], [place], [address] Phone: [phone]   Fax:  [phone]  Physical Therapy Evaluation  Patient Details  Name: Bridget [name] MRN: [id number] Date of Birth: 1961-07-30 Referring Provider: Peggye LeyJeffery Jenkins    Encounter Date: 11/27/2017  PT End of Session - 11/27/17 1329    Visit Number  1    Number of Visits  17    Date for PT Re-Evaluation  01/22/18    Authorization Type  [facility] [employer]    PT Start Time  0845    PT Stop Time  0929    PT Time Calculation (min)  44 min    Activity Tolerance  Patient tolerated treatment well    Behavior During Therapy  [facility] for tasks assessed/performed       Past Medical History:  Diagnosis Date  . Bacterial vaginosis 12/07/2010  . Depression   . Diabetes mellitus without complication (HCC)   . Diverticulosis   . DIVERTICULOSIS, COLON 10/26/2006  . Fibromyalgia   . GERD (gastroesophageal reflux disease)   . Glaucoma   . Headache   . HEEL SPUR 09/10/2007   Qualifier: Diagnosis of  By: Lanier PrudeBolden  MD, Cathrine Musteraineisha    . Hypertension   . MALAISE AND FATIGUE 05/27/2010  . Obesity   . OBESITY, NOS 10/26/2006  . Osteoarthritis   . OSTEOARTHRITIS, KNEE 04/27/2010  . Retained tampon 05/21/2012  . SHOULDER PAIN, LEFT 05/19/2010   Qualifier: Diagnosis of  By: Clotilde DieterStrother MD, Amber    . Vaginal yeast infection 12/08/2010    Past Surgical History:  Procedure Laterality Date  . ABDOMINAL HYSTERECTOMY  1990  . ABDOMINAL HYSTERECTOMY    . BLADDER SURGERY  2001  . CATARACT EXTRACTION Right 2001   with implant  . EYE SURGERY     implant rt eye  . ROTATOR CUFF REPAIR Right   . SHOULDER ARTHROSCOPY WITH SUBACROMIAL DECOMPRESSION Left 07/20/2015   Procedure: SHOULDER ARTHROSCOPY WITH DEBRIDEMENT OF ROTATOR CUFF AND SUBACROMIAL DECOMPRESSION ;  Surgeon: Jones BroomJustin Chandler, MD;  Location: [facility];  Service: Orthopedics;  Laterality: Left;  Left shoulder arthroscopy with  debridement of rotator cuff and subacromial decompression    There were no vitals filed for this visit.   Subjective Assessment - 11/27/17 1315    Subjective  Patient reports pain in the neck is currently at a 5/10 but can get up to a 10/10 at times. 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 Patient reports a gradual onset of pain which began 6 months ago. Patient states that the pain starts in her neck and travels to her shoulder which is a burning/tingling feeling. Pt also reports occasional numbness in her R arm.      Limitations  Sitting;Lifting;House hold activities    Diagnostic tests  MRI    Patient Stated Goals  Working without pain; ADLs without pain; Walking dogs without pain     Currently in Pain?  Yes    Pain Score  5     Pain Location  Neck    Pain Orientation  Left    Pain Descriptors / Indicators  Constant    Pain Type  Chronic pain    Pain Radiating Towards  L Shoulder     Pain Onset  More than a month ago    Pain Frequency  Constant    Aggravating Factors  Lifting heavy objects; Reaching overhead; Rotating heas     Pain Relieving Factors  Laying down/Rest; Rubbing alcohol    Effect of Pain on Daily Activities  Unable to complete ADLs and work without pain          [facility] PT Assessment - 11/28/17 0001      Assessment   Medical Diagnosis  Cervicalgia     Referring Provider  Peggye Ley     Prior Therapy  Yes      Precautions   Precautions  None      Restrictions   Weight Bearing Restrictions  No      Balance Screen   Has the patient fallen in the past 6 months  No    Has the patient had a decrease in activity level because of a fear of falling?   No    Is the patient reluctant to leave their home because of a fear of falling?   No      Prior Function   Level of Independence  Independent    Vocation  Full time employment    Vocation Requirements   Sitting at a computer desk; Rotating to different monitors       Cognition   Overall Cognitive Status  Within Functional Limits for tasks assessed    Attention  Focused    Focused Attention  Appears intact    Memory  Appears intact    Awareness  Appears intact    Problem Solving  Appears intact      Posture/Postural Control   Posture Comments  Rounded shoulders      AROM   Cervical Flexion  40    Cervical Extension  45    Cervical - Right Side Bend  30 caused an increase in pain on the left    Cervical - Left Side Bend  40    Cervical - Right Rotation  60     Cervical - Left Rotation  65      Strength   Right Shoulder Flexion  4/5 caused pain at the elbow    Right Shoulder Internal Rotation  5/5    Right Shoulder External Rotation  4/5 Previous RCT    Left Shoulder Flexion  4-/5 felt a pull in the neck     Left Shoulder Internal Rotation  5/5    Left Shoulder External Rotation  4/5    Right Hand Grip (lbs)  18     Left Hand Grip (lbs)  16 Felt pain in the neck       Palpation   Palpation comment  Hypomobility/Pain at C6/C7; muscle spasming in the left upper trap and cervical paraspinals      Bed Mobility   Bed Mobility  -- Pt feels comfortable in supine with pillows lifting head       Ambulation/Gait   Gait Comments  Decrease upper body movement; decreased arm swing; decreased hip flexion                Objective measurements completed on examination: See above findings.      [facility] Adult PT Treatment/Exercise - 11/28/17 0001      Manual Therapy   Soft tissue mobilization  Left Upper Trap; Cervical Paraspinals    Manual Traction  General distraction          Neck Exercises: Stretches   Upper Trapezius Stretch  2 reps;20 seconds    Levator Stretch  2 reps;30 seconds  PT Education - 11/27/17 1328    Education provided  Yes    Education Details  Desk ergonomics; Symptom management; HEP    Person(s) Educated  Patient    Methods   Explanation;Demonstration;Tactile cues;Verbal cues;Handout    Comprehension  Verbalized understanding;Returned demonstration;Need further instruction       PT Short Term Goals - 11/27/17 1318      PT SHORT TERM GOAL #1   Title  Patient will report neck pain <2/10 with no radicular pain into the left shoulder.     Baseline  5/10    Time  4    Period  Weeks    Status  New    Target Date  12/25/17      PT SHORT TERM GOAL #2   Title  Patient will increase bilateral gross shoulder strength to a 5/5 without pain.     Baseline  4/5    Time  4    Period  Weeks    Status  New    Target Date  12/25/17      PT SHORT TERM GOAL #3   Title  Patient will display full cervical active range of motion in all planes without pain.    Baseline  Right SB 30, Rotation 60; Left SB 40, Rotation 65; Flexion 40; Extension 45    Time  4    Period  Weeks    Status  New    Target Date  12/25/17        PT Long Term Goals - 11/27/17 1320      PT LONG TERM GOAL #1   Title  Patient will reach overhead into a cabinet with no increase in cervical pain in order to perform ADLs.     Baseline  Pain with reaching overhead     Time  8    Period  Weeks    Status  New    Target Date  01/22/18      PT LONG TERM GOAL #2   Title  Patient will work a full work day with reported neck pain <2/10.    Baseline  10/10 pain at the end of work day     Time  8    Period  Weeks    Status  New    Target Date  01/22/18      PT LONG TERM GOAL #3   Title  Patient will walk pet longer than 30 mins without reported increase in left shoulder pain.     Baseline  pain >30 mins     Time  8    Period  Weeks    Status  New    Target Date  01/22/18             Plan - 11/27/17 1330    Clinical Impression Statement  Patient is a 57 year old female reporting to physical therapy with to neck pain which radiates down to her left shoulder. Patient presents with decreased active cervical range of motion in all planes and  with rght cervical sidebending causing an increase in pain on the left side. Patient also displays decreased in gross stregnth of the left shoulder which was limited due to pain in the neck. Skilled palpation indicated hypomobility and pain at segments C5-C7. Patient will benefit from skilled therapy in order to increase cervical range of motion, increase shoulder strength, increase cervical mobility and decrease pain in order to perfrom functional daily activities.  History and Personal Factors relevant to plan of care:  DM, HTN, Fibromyalgia    Clinical Presentation  Stable    Clinical Decision Making  Low    Rehab Potential  Good    PT Frequency  2x / week    PT Duration  8 weeks    PT Treatment/Interventions  ADLs/Self Care Home Management;Electrical Stimulation;Iontophoresis 4mg /ml Dexamethasone;Moist Heat;Traction;Ultrasound;Therapeutic activities;Therapeutic exercise;Neuromuscular re-education;Patient/family education;Manual techniques;Passive range of motion;Dry needling;Taping;Vasopneumatic Device    PT Next Visit Plan  Grade 3-4 Cervical PA mobs; Scapular strengthening; PROM; Manual cervical stretches; Manual Cervical traction; Trigger point release to L upper trap and cervical paraspinals      PT Home Exercise Plan  Seated levator stretch; seated upper trap stretch; self trigger point release ([place])    Consulted and Agree with Plan of Care  Patient       Patient will benefit from skilled therapeutic intervention in order to improve the following deficits and impairments:  Hypomobility, Decreased strength, Pain, Increased muscle spasms, Decreased range of motion  Visit Diagnosis: Cervicalgia  Muscle weakness (generalized)     Problem List Patient Active Problem List   Diagnosis Date Noted  . Right hand weakness 09/18/2017  . Plantar fasciitis 10/10/2016  . Foot pain, bilateral 09/09/2016  . Radiculopathy 09/09/2016  . GERD (gastroesophageal reflux disease)  09/09/2014  . Hyperlipidemia 05/24/2013  . Lung nodule seen on imaging study 09/21/2012  . Chronic pain of multiple sites - Knees, Ankles, Back 11/09/2011  . Lupus (systemic lupus erythematosus) (HCC) 06/28/2011  . HYPOTHYROIDISM 04/27/2010  . Depression 04/27/2010  . OSTEOARTHRITIS, KNEE 04/27/2010  . Diabetes mellitus type 2, uncontrolled, with complications (HCC) 08/14/2007  . Obesity 10/26/2006  . HYPERTENSION, BENIGN SYSTEMIC 10/26/2006  . VENOUS INSUFFICIENCY, CHRONIC 10/26/2006   Lorayne Bender PT DPT  11/28/2017   During this treatment session, the therapist was present, participating in and directing the treatment.  Elisabeth Pigeon SPT  11/28/2017, 7:56 AM  [facility] [address] [place], [place], [address] Phone: [phone]   Fax:  [phone]  Name: MARIBELL DEMEO MRN: [id number] Date of Birth: 03-Aug-1961

## 2017-12-01 ENCOUNTER — Other Ambulatory Visit: Payer: Self-pay | Admitting: Internal Medicine

## 2017-12-01 ENCOUNTER — Other Ambulatory Visit: Payer: Self-pay | Admitting: Endocrinology

## 2017-12-01 MED FILL — ACCU-CHEK GUIDE TEST STRIP: 90 days supply | Qty: 200 | Fill #0

## 2017-12-01 MED FILL — TRESIBA FLEXTOUCH 100 UNITS: 100 | 25 days supply | Qty: 15 | Fill #6

## 2017-12-01 MED FILL — PANTOPRAZOLE SOD DR 20 MG T: 20 | 30 days supply | Qty: 30 | Fill #0

## 2017-12-06 ENCOUNTER — Ambulatory Visit: Payer: 59 | Admitting: Physical Therapy

## 2017-12-06 DIAGNOSIS — M542 Cervicalgia: Secondary | ICD-10-CM | POA: Diagnosis not present

## 2017-12-06 DIAGNOSIS — M6281 Muscle weakness (generalized): Secondary | ICD-10-CM | POA: Diagnosis not present

## 2017-12-06 NOTE — Therapy (Signed)
[facility] [address] [place], [place], [address] Phone: [phone]   Fax:  [phone]  Physical Therapy Treatment  Patient Details  Name: Bridget [name] MRN: [id number] Date of Birth: 31-Dec-1960 Referring Provider: Peggye Ley    Encounter Date: 12/06/2017  PT End of Session - 12/06/17 0944    Visit Number  2    Number of Visits  17    Date for PT Re-Evaluation  01/22/18    Authorization Type  [facility]    PT Start Time  0803    PT Stop Time  0845    PT Time Calculation (min)  42 min    Activity Tolerance  Patient tolerated treatment well    Behavior During Therapy  [facility] for tasks assessed/performed       Past Medical History:  Diagnosis Date  . Bacterial vaginosis 12/07/2010  . Depression   . Diabetes mellitus without complication (HCC)   . Diverticulosis   . DIVERTICULOSIS, COLON 10/26/2006  . Fibromyalgia   . GERD (gastroesophageal reflux disease)   . Glaucoma   . Headache   . HEEL SPUR 09/10/2007   Qualifier: Diagnosis of  By: Lanier Prude  MD, Cathrine Muster    . Hypertension   . MALAISE AND FATIGUE 05/27/2010  . Obesity   . OBESITY, NOS 10/26/2006  . Osteoarthritis   . OSTEOARTHRITIS, KNEE 04/27/2010  . Retained tampon 05/21/2012  . SHOULDER PAIN, LEFT 05/19/2010   Qualifier: Diagnosis of  By: Clotilde Dieter MD, Amber    . Vaginal yeast infection 12/08/2010    Past Surgical History:  Procedure Laterality Date  . ABDOMINAL HYSTERECTOMY  1990  . ABDOMINAL HYSTERECTOMY    . BLADDER SURGERY  2001  . CATARACT EXTRACTION Right 2001   with implant  . EYE SURGERY     implant rt eye  . ROTATOR CUFF REPAIR Right   . SHOULDER ARTHROSCOPY WITH SUBACROMIAL DECOMPRESSION Left 07/20/2015   Procedure: SHOULDER ARTHROSCOPY WITH DEBRIDEMENT OF ROTATOR CUFF AND SUBACROMIAL DECOMPRESSION ;  Surgeon: Jones Broom, MD;  Location: [facility];  Service: Orthopedics;  Laterality: Left;  Left shoulder arthroscopy with  debridement of rotator cuff and subacromial decompression    There were no vitals filed for this visit.  Subjective Assessment - 12/06/17 0806    Subjective  Patient reports that her neck is feeling tender today but she is having no pain. Patient has been doing stretches and has purchased a shepards hook.    Limitations  Sitting;Lifting;House hold activities    Diagnostic tests  MRI: c6-c7 Moderate disc buldge with potential C7 nerve root impingement     Patient Stated Goals  Working without pain; ADLs without pain; Walking dogs without pain     Currently in Pain?  No/denies                       [facility] Adult PT Treatment/Exercise - 12/06/17 0001      Neck Exercises: Seated   Other Seated Exercise  scap retraction 2x10       Neck Exercises: Supine   UE D2 Limitations  yellowx10 pain in right shoulder; bilateral ER 2x10 yellow     Other Supine Exercise  shoulder flexion wand x10;       Manual Therapy   Soft tissue mobilization  Left Upper Trap; Cervical Paraspinals    Manual Traction  General distraction          Neck Exercises: Stretches   Upper  Trapezius Stretch  2 reps;20 seconds    Levator Stretch  2 reps;30 seconds             PT Education - 12/06/17 0943    Education provided  Yes    Education Details  updated HEP; importance of HEP     Person(s) Educated  Patient    Methods  Explanation;Demonstration;Tactile cues;Verbal cues    Comprehension  Verbalized understanding;Returned demonstration;Verbal cues required;Tactile cues required       PT Short Term Goals - 12/06/17 0946      PT SHORT TERM GOAL #1   Title  Patient will report neck pain <2/10 with no radicular pain into the left shoulder.     Baseline  no radicualr pain today. look at carryover     Time  4    Period  Weeks    Status  On-going      PT SHORT TERM GOAL #2   Title  Patient will increase bilateral gross shoulder strength to a 5/5 without pain.     Baseline  4/5    Time  4     Period  Weeks    Status  On-going      PT SHORT TERM GOAL #3   Title  Patient will display full cervical active range of motion in all planes without pain.    Baseline  Right SB 30, Rotation 60; Left SB 40, Rotation 65; Flexion 40; Extension 45    Time  4    Status  On-going        PT Long Term Goals - 11/27/17 1320      PT LONG TERM GOAL #1   Title  Patient will reach overhead into a cabinet with no increase in cervical pain in order to perform ADLs.     Baseline  Pain with reaching overhead     Time  8    Period  Weeks    Status  New    Target Date  01/22/18      PT LONG TERM GOAL #2   Title  Patient will work a full work day with reported neck pain <2/10.    Baseline  10/10 pain at the end of work day     Time  8    Period  Weeks    Status  New    Target Date  01/22/18      PT LONG TERM GOAL #3   Title  Patient will walk pet longer than 30 mins without reported increase in left shoulder pain.     Baseline  pain >30 mins     Time  8    Period  Weeks    Status  New    Target Date  01/22/18            Plan - 12/06/17 0808    Clinical Impression Statement  Good tolerance to ther-ex. She did reporta a slight increase in right shoulder pain. She has significant tightness and spasming from C5-c7. she may benefit from [facility]. Consdier next visit.     Clinical Presentation  Stable    Clinical Decision Making  Low    Rehab Potential  Good    PT Frequency  2x / week    PT Duration  8 weeks    PT Treatment/Interventions  ADLs/Self Care Home Management;Electrical Stimulation;Iontophoresis 4mg /ml Dexamethasone;Moist Heat;Traction;Ultrasound;Therapeutic activities;Therapeutic exercise;Neuromuscular re-education;Patient/family education;Manual techniques;Passive range of motion;Dry needling;Taping;Vasopneumatic Device    PT Next Visit Plan  Grade 3-4 Cervical  PA mobs; Scapular strengthening; PROM; Manual cervical stretches; Manual Cervical traction; Trigger point release to L  upper trap and cervical paraspinals; education on TPDN      PT Home Exercise Plan  Seated levator stretch; seated upper trap stretch; self trigger point release ([place])    Consulted and Agree with Plan of Care  Patient       Patient will benefit from skilled therapeutic intervention in order to improve the following deficits and impairments:  Hypomobility, Decreased strength, Pain, Increased muscle spasms, Decreased range of motion  Visit Diagnosis: Cervicalgia  Muscle weakness (generalized)     Problem List Patient Active Problem List   Diagnosis Date Noted  . Right hand weakness 09/18/2017  . Plantar fasciitis 10/10/2016  . Foot pain, bilateral 09/09/2016  . Radiculopathy 09/09/2016  . GERD (gastroesophageal reflux disease) 09/09/2014  . Hyperlipidemia 05/24/2013  . Lung nodule seen on imaging study 09/21/2012  . Chronic pain of multiple sites - Knees, Ankles, Back 11/09/2011  . Lupus (systemic lupus erythematosus) (HCC) 06/28/2011  . HYPOTHYROIDISM 04/27/2010  . Depression 04/27/2010  . OSTEOARTHRITIS, KNEE 04/27/2010  . Diabetes mellitus type 2, uncontrolled, with complications (HCC) 08/14/2007  . Obesity 10/26/2006  . HYPERTENSION, BENIGN SYSTEMIC 10/26/2006  . VENOUS INSUFFICIENCY, CHRONIC 10/26/2006    Dessie Comaavid J Prabhleen Montemayor PT DPT  12/06/2017, 9:52 AM  Elisabeth PigeonNicole Bailey SPT  12/06/2017   During this treatment session, the therapist was present, participating in and directing the treatment.  [facility] [address] [place], [place], [address] Phone: [phone]   Fax:  [phone]  Name: Bridget [name] MRN: [id number] Date of Birth: 11-18-1960

## 2017-12-08 ENCOUNTER — Encounter: Payer: Self-pay | Admitting: Physical Therapy

## 2017-12-08 ENCOUNTER — Ambulatory Visit: Payer: 59 | Admitting: Physical Therapy

## 2017-12-08 DIAGNOSIS — M542 Cervicalgia: Secondary | ICD-10-CM

## 2017-12-08 DIAGNOSIS — M6281 Muscle weakness (generalized): Secondary | ICD-10-CM | POA: Diagnosis not present

## 2017-12-08 NOTE — Therapy (Addendum)
[facility] Outpatient Rehabilitation [facility] [address] [place], [place], [address] Phone: [phone]   Fax:  [phone]  Physical Therapy Treatment  Patient Details  Name: Bridget [name] MRN: [id number] Date of Birth: [birth year] Referring Provider: Peggye Ley    Encounter Date: 12/08/2017  PT End of Session - 12/08/17 0856    Visit Number  3    Number of Visits  17    Date for PT Re-Evaluation  01/22/18    Authorization Type  [facility] [employer]    PT Start Time  0800    PT Stop Time  0844    PT Time Calculation (min)  44 min    Activity Tolerance  Patient tolerated treatment well    Behavior During Therapy  [facility] for tasks assessed/performed       Past Medical History:  Diagnosis Date  . Bacterial vaginosis 12/07/2010  . Depression   . Diabetes mellitus without complication (HCC)   . Diverticulosis   . DIVERTICULOSIS, COLON 10/26/2006  . Fibromyalgia   . GERD (gastroesophageal reflux disease)   . Glaucoma   . Headache   . HEEL SPUR 09/10/2007   Qualifier: Diagnosis of  By: Lanier Prude  MD, Cathrine Muster    . Hypertension   . MALAISE AND FATIGUE 05/27/2010  . Obesity   . OBESITY, NOS 10/26/2006  . Osteoarthritis   . OSTEOARTHRITIS, KNEE 04/27/2010  . Retained tampon 05/21/2012  . SHOULDER PAIN, LEFT 05/19/2010   Qualifier: Diagnosis of  By: Clotilde Dieter MD, Amber    . Vaginal yeast infection 12/08/2010    Past Surgical History:  Procedure Laterality Date  . ABDOMINAL HYSTERECTOMY  1990  . ABDOMINAL HYSTERECTOMY    . BLADDER SURGERY  2001  . CATARACT EXTRACTION Right 2001   with implant  . EYE SURGERY     implant rt eye  . ROTATOR CUFF REPAIR Right   . SHOULDER ARTHROSCOPY WITH SUBACROMIAL DECOMPRESSION Left 07/20/2015   Procedure: SHOULDER ARTHROSCOPY WITH DEBRIDEMENT OF ROTATOR CUFF AND SUBACROMIAL DECOMPRESSION ;  Surgeon: Jones Broom, MD;  Location: [facility];  Service: Orthopedics;  Laterality: Left;  Left shoulder arthroscopy with  debridement of rotator cuff and subacromial decompression    There were no vitals filed for this visit.  Subjective Assessment - 12/08/17 0804    Subjective  Patient reports that her neck pain has not been that bad this week because she had the week off from work. Patient reports that  her neck is stiff in the mornings but feels better when she gets up and starts moving.     Limitations  Sitting;Lifting;House hold activities    Diagnostic tests  MRI: c6-c7 Moderate disc buldge with potential C7 nerve root impingement     Patient Stated Goals  Working without pain; ADLs without pain; Walking dogs without pain     Pain Score  4     Pain Location  Neck    Pain Orientation  Left    Pain Descriptors / Indicators  Constant    Pain Type  Chronic pain    Pain Radiating Towards  L shoulder    Pain Onset  More than a month ago    Pain Frequency  Constant    Aggravating Factors   Lifting heavy objects; reaching overhead; rotating head     Pain Relieving Factors  Laying down/Rest; Rubbing alcohol    Effect of Pain on Daily Activities  Unable to complete ADLs and work without pain  [facility] Adult PT Treatment/Exercise - 12/08/17 0001      Manual Therapy   Soft tissue mobilization  IASTM; Left Upper Trap; Cervical Paraspinals    Manual Traction  General distraction                 PT Education - 12/08/17 0855    Education provided  Yes    Education Details  reviewed HEP; TPDN benefits and risks     Person(s) Educated  Patient    Methods  Explanation;Demonstration;Tactile cues;Verbal cues    Comprehension  Verbalized understanding;Returned demonstration;Verbal cues required;Tactile cues required       PT Short Term Goals - 12/06/17 0946      PT SHORT TERM GOAL #1   Title  Patient will report neck pain <2/10 with no radicular pain into the left shoulder.     Baseline  no radicualr pain today. look at carryover     Time  4    Period  Weeks     Status  On-going      PT SHORT TERM GOAL #2   Title  Patient will increase bilateral gross shoulder strength to a 5/5 without pain.     Baseline  4/5    Time  4    Period  Weeks    Status  On-going      PT SHORT TERM GOAL #3   Title  Patient will display full cervical active range of motion in all planes without pain.    Baseline  Right SB 30, Rotation 60; Left SB 40, Rotation 65; Flexion 40; Extension 45    Time  4    Status  On-going        PT Long Term Goals - 11/27/17 1320      PT LONG TERM GOAL #1   Title  Patient will reach overhead into a cabinet with no increase in cervical pain in order to perform ADLs.     Baseline  Pain with reaching overhead     Time  8    Period  Weeks    Status  New    Target Date  01/22/18      PT LONG TERM GOAL #2   Title  Patient will work a full work day with reported neck pain <2/10.    Baseline  10/10 pain at the end of work day     Time  8    Period  Weeks    Status  New    Target Date  01/22/18      PT LONG TERM GOAL #3   Title  Patient will walk pet longer than 30 mins without reported increase in left shoulder pain.     Baseline  pain >30 mins     Time  8    Period  Weeks    Status  New    Target Date  01/22/18            Plan - 12/08/17 0905    Clinical Impression Statement  Today therapy introduced TPDN in order to decrease muscle spasming in patients neck. Therapy continued with IASTM to the upper trap and cervical paraspinals. Patient seems to be responding well to the manual therapy that has been provided. Upon palpation of the neck a decrease in tension and tightness of the cervical paraspinals could be felt. Next visit therapy will focus on postural strengthening.     Clinical Presentation  Stable    Rehab Potential  Good  PT Frequency  2x / week    PT Duration  8 weeks    PT Treatment/Interventions  ADLs/Self Care Home Management;Electrical Stimulation;Iontophoresis 4mg /ml Dexamethasone;Moist  Heat;Traction;Ultrasound;Therapeutic activities;Therapeutic exercise;Neuromuscular re-education;Patient/family education;Manual techniques;Passive range of motion;Dry needling;Taping;Vasopneumatic Device    PT Next Visit Plan  Grade 3-4 Cervical PA mobs; Scapular strengthening; PROM; Manual cervical stretches; Manual Cervical traction; Trigger point release to L upper trap and cervical paraspinals; education on TPDN      PT Home Exercise Plan  Seated levator stretch; seated upper trap stretch; self trigger point release ([place])    Consulted and Agree with Plan of Care  Patient       Patient will benefit from skilled therapeutic intervention in order to improve the following deficits and impairments:  Hypomobility, Decreased strength, Pain, Increased muscle spasms, Decreased range of motion  Visit Diagnosis: Cervicalgia  Muscle weakness (generalized)     Problem List Patient Active Problem List   Diagnosis Date Noted  . Right hand weakness 09/18/2017  . Plantar fasciitis 10/10/2016  . Foot pain, bilateral 09/09/2016  . Radiculopathy 09/09/2016  . GERD (gastroesophageal reflux disease) 09/09/2014  . Hyperlipidemia 05/24/2013  . Lung nodule seen on imaging study 09/21/2012  . Chronic pain of multiple sites - Knees, Ankles, Back 11/09/2011  . Lupus (systemic lupus erythematosus) (HCC) 06/28/2011  . HYPOTHYROIDISM 04/27/2010  . Depression 04/27/2010  . OSTEOARTHRITIS, KNEE 04/27/2010  . Diabetes mellitus type 2, uncontrolled, with complications (HCC) 08/14/2007  . Obesity 10/26/2006  . HYPERTENSION, BENIGN SYSTEMIC 10/26/2006  . VENOUS INSUFFICIENCY, CHRONIC 10/26/2006   Lorayne Benderavid Carroll PT DPTR 12/08/2017  Elisabeth PigeonNicole Yannis Gumbs SPT  12/08/2017, 9:15 AM   During this treatment session, the therapist was present, participating in and directing the treatment.  [facility] [address] [place], [place], [address] Phone: [phone]    Fax:  [phone]  Name: Bridget [name] MRN: [id number] Date of Birth: [birth year]

## 2017-12-12 ENCOUNTER — Ambulatory Visit: Payer: 59 | Admitting: Physical Therapy

## 2017-12-12 ENCOUNTER — Encounter: Payer: Self-pay | Admitting: Physical Therapy

## 2017-12-12 DIAGNOSIS — M542 Cervicalgia: Secondary | ICD-10-CM | POA: Diagnosis not present

## 2017-12-12 DIAGNOSIS — M6281 Muscle weakness (generalized): Secondary | ICD-10-CM

## 2017-12-12 NOTE — Therapy (Signed)
[facility] Outpatient Rehabilitation [facility] [address] [place], [place], [address] Phone: [phone]   Fax:  [phone]  Physical Therapy Treatment  Patient Details  Name: Bridget [name] MRN: [id number] Date of Birth: 1961/05/15 Referring Provider: Peggye Ley    Encounter Date: 12/12/2017  PT End of Session - 12/12/17 0909    Visit Number  4    Number of Visits  17    Date for PT Re-Evaluation  01/22/18    PT Start Time  0804    PT Stop Time  0850    PT Time Calculation (min)  46 min    Activity Tolerance  Patient tolerated treatment well    Behavior During Therapy  [facility] for tasks assessed/performed       Past Medical History:  Diagnosis Date  . Bacterial vaginosis 12/07/2010  . Depression   . Diabetes mellitus without complication (HCC)   . Diverticulosis   . DIVERTICULOSIS, COLON 10/26/2006  . Fibromyalgia   . GERD (gastroesophageal reflux disease)   . Glaucoma   . Headache   . HEEL SPUR 09/10/2007   Qualifier: Diagnosis of  By: Lanier Prude  MD, Cathrine Muster    . Hypertension   . MALAISE AND FATIGUE 05/27/2010  . Obesity   . OBESITY, NOS 10/26/2006  . Osteoarthritis   . OSTEOARTHRITIS, KNEE 04/27/2010  . Retained tampon 05/21/2012  . SHOULDER PAIN, LEFT 05/19/2010   Qualifier: Diagnosis of  By: Clotilde Dieter MD, Amber    . Vaginal yeast infection 12/08/2010    Past Surgical History:  Procedure Laterality Date  . ABDOMINAL HYSTERECTOMY  1990  . ABDOMINAL HYSTERECTOMY    . BLADDER SURGERY  2001  . CATARACT EXTRACTION Right 2001   with implant  . EYE SURGERY     implant rt eye  . ROTATOR CUFF REPAIR Right   . SHOULDER ARTHROSCOPY WITH SUBACROMIAL DECOMPRESSION Left 07/20/2015   Procedure: SHOULDER ARTHROSCOPY WITH DEBRIDEMENT OF ROTATOR CUFF AND SUBACROMIAL DECOMPRESSION ;  Surgeon: Jones Broom, MD;  Location: [facility];  Service: Orthopedics;  Laterality: Left;  Left shoulder arthroscopy with debridement of rotator cuff and  subacromial decompression    There were no vitals filed for this visit.  Subjective Assessment - 12/12/17 0808    Subjective  Stiff.  No pain yesterday because out of work 6 days.  Work last night 6/10.    Currently in Pain?  Yes    Pain Score  6     Pain Location  Neck    Pain Orientation  Left    Pain Descriptors / Indicators  Spasm;Constant    Pain Relieving Factors  rest,    Effect of Pain on Daily Activities  working    Multiple Pain Sites  -- hip pain  10/10, right  right knee 7-8/10   low back  at times  worse in yard work                       [facility] Adult PT Treatment/Exercise - 12/12/17 0001      Self-Care   ADL's  handout issued and review in part.  will need to spend more time with education.    Posture  practiced sitting posture with lumbar roll and pillows under each arm,  feet firmly on floor.        Neck Exercises: Seated   Neck Retraction  5 reps cued,  posture improved    Other Seated Exercise  scap retraction  5  X      Shoulder Exercises: Supine   Horizontal ABduction  10 reps    Theraband Level (Shoulder Horizontal ABduction)  Level 1 (Yellow)    Horizontal ABduction Limitations  cued HEP,  NO pain    External Rotation  10 reps    Theraband Level (Shoulder External Rotation)  Level 1 (Yellow)    External Rotation Limitations  cued,  HEP  no pain    Flexion  10 reps    Theraband Level (Shoulder Flexion)  Level 1 (Yellow)    Flexion Limitations  narrow grip,  HEP,  no pain,  good ROM    Other Supine Exercises  sash,  level 1 yellow,  10 X HEP,  no pain good range      Manual Therapy   Soft tissue mobilization  Neuromuscular trigger point release upper trap,  teres and levator,  tissue softened.  Instrument assist at times.  bilateral upper quadrant,  gentle strumming to neck muscles with stretch to lengthen.             PT Education - 12/12/17 0815    Education provided  Yes    Education Details  ADL handout review verbally,  HEP  with practice    Person(s) Educated  Patient    Methods  Explanation;Verbal cues;Handout;Demonstration;Tactile cues    Comprehension  Verbalized understanding;Returned demonstration;Tactile cues required       PT Short Term Goals - 12/06/17 0946      PT SHORT TERM GOAL #1   Title  Patient will report neck pain <2/10 with no radicular pain into the left shoulder.     Baseline  no radicualr pain today. look at carryover     Time  4    Period  Weeks    Status  On-going      PT SHORT TERM GOAL #2   Title  Patient will increase bilateral gross shoulder strength to a 5/5 without pain.     Baseline  4/5    Time  4    Period  Weeks    Status  On-going      PT SHORT TERM GOAL #3   Title  Patient will display full cervical active range of motion in all planes without pain.    Baseline  Right SB 30, Rotation 60; Left SB 40, Rotation 65; Flexion 40; Extension 45    Time  4    Status  On-going        PT Long Term Goals - 11/27/17 1320      PT LONG TERM GOAL #1   Title  Patient will reach overhead into a cabinet with no increase in cervical pain in order to perform ADLs.     Baseline  Pain with reaching overhead     Time  8    Period  Weeks    Status  New    Target Date  01/22/18      PT LONG TERM GOAL #2   Title  Patient will work a full work day with reported neck pain <2/10.    Baseline  10/10 pain at the end of work day     Time  8    Period  Weeks    Status  New    Target Date  01/22/18      PT LONG TERM GOAL #3   Title  Patient will walk pet longer than 30 mins without reported increase in left shoulder pain.  Baseline  pain >30 mins     Time  8    Period  Weeks    Status  New    Target Date  01/22/18            Plan - 12/12/17 0910    Clinical Impression Statement  Postural strengthening focus today with the addition of new HEP.  She was able to exercise without incerased pain and ROM   was WNL for shoulders post manual.  She continues to need education  for ADL and posture.     PT Next Visit Plan  Grade 3-4 Cervical PA mobs; Scapular strengthenin;( review supine scapular stabilization and progress to red band as able) PROM; Manual cervical stretches; Manual Cervical traction; Trigger point release to L upper trap and cervical paraspinals;     PT Home Exercise Plan  Seated levator stretch; seated upper trap stretch; self trigger point release ([place])Supine band:  ER, narrow grip flexion, sash and horizontal abduction    Consulted and Agree with Plan of Care  Patient       Patient will benefit from skilled therapeutic intervention in order to improve the following deficits and impairments:     Visit Diagnosis: Cervicalgia  Muscle weakness (generalized)     Problem List Patient Active Problem List   Diagnosis Date Noted  . Right hand weakness 09/18/2017  . Plantar fasciitis 10/10/2016  . Foot pain, bilateral 09/09/2016  . Radiculopathy 09/09/2016  . GERD (gastroesophageal reflux disease) 09/09/2014  . Hyperlipidemia 05/24/2013  . Lung nodule seen on imaging study 09/21/2012  . Chronic pain of multiple sites - Knees, Ankles, Back 11/09/2011  . Lupus (systemic lupus erythematosus) (HCC) 06/28/2011  . HYPOTHYROIDISM 04/27/2010  . Depression 04/27/2010  . OSTEOARTHRITIS, KNEE 04/27/2010  . Diabetes mellitus type 2, uncontrolled, with complications (HCC) 08/14/2007  . Obesity 10/26/2006  . HYPERTENSION, BENIGN SYSTEMIC 10/26/2006  . VENOUS INSUFFICIENCY, CHRONIC 10/26/2006    Saraih Lorton  PTA 12/12/2017, 9:17 AM  [facility] [address] [place], [place], [address] Phone: [phone]   Fax:  [phone]  Name: Bridget [name] MRN: [id number] Date of Birth: 1961/04/15

## 2017-12-12 NOTE — Patient Instructions (Addendum)
Sleeping on Back  Place pillow under knees. A pillow with cervical support and a roll around waist are also helpful. Copyright  [employer]HI. All rights reserved.  Sleeping on Side Place pillow between knees. Use cervical support under neck and a roll around waist as needed. Copyright  VHI. All rights reserved.   Sleeping on Stomach   If this is the only desirable sleeping position, place pillow under lower legs, and under stomach or chest as needed.  Posture - Sitting   Sit upright, head facing forward. Try using a roll to support lower back. Keep shoulders relaxed, and avoid rounded back. Keep hips level with knees. Avoid crossing legs for long periods. Stand to Sit / Sit to Stand   To sit: Bend knees to lower self onto front edge of chair, then scoot back on seat. To stand: Reverse sequence by placing one foot forward, and scoot to front of seat. Use rocking motion to stand up.   Work Height and Reach  Ideal work height is no more than 2 to 4 inches below elbow level when standing, and at elbow level when sitting. Reaching should be limited to arm's length, with elbows slightly bent.  Bending  Bend at hips and knees, not back. Keep feet shoulder-width apart.    Posture - Standing   Good posture is important. Avoid slouching and forward head thrust. Maintain curve in low back and align ears over shoul- ders, hips over ankles.  Alternating Positions   Alternate tasks and change positions frequently to reduce fatigue and muscle tension. Take rest breaks. Computer Work   Position work to face forward. Use proper work and seat height. Keep shoulders back and down, wrists straight, and elbows at right angles. Use chair that provides full back support. Add footrest and lumbar roll as needed.  Getting Into / Out of Car  Lower self onto seat, scoot back, then bring in one leg at a time. Reverse sequence to get out.  Dressing  Lie on back to pull socks or slacks over feet, or sit  and bend leg while keeping back straight.    Housework - Sink  Place one foot on ledge of cabinet under sink when standing at sink for prolonged periods.   Pushing / Pulling  Pushing is preferable to pulling. Keep back in proper alignment, and use leg muscles to do the work.  Deep Squat   Squat and lift with both arms held against upper trunk. Tighten stomach muscles without holding breath. Use smooth movements to avoid jerking.  Avoid Twisting   Avoid twisting or bending back. Pivot around using foot movements, and bend at knees if needed when reaching for articles.  Carrying Luggage   Distribute weight evenly on both sides. Use a cart whenever possible. Do not twist trunk. Move body as a unit.   Lifting Principles .Maintain proper posture and head alignment. .Slide object as close as possible before lifting. .Move obstacles out of the way. .Test before lifting; ask for help if too heavy. .Tighten stomach muscles without holding breath. .Use smooth movements; do not jerk. .Use legs to do the work, and pivot with feet. .Distribute the work load symmetrically and close to the center of trunk. .Push instead of pull whenever possible.   Ask For Help   Ask for help and delegate to others when possible. Coordinate your movements when lifting together, and maintain the low back curve.  Log Roll   Lying on back, bend left knee and place left   arm across chest. Roll all in one movement to the right. Reverse to roll to the left. Always move as one unit. Housework - Sweeping  Use long-handled equipment to avoid stooping.   Housework - Wiping  Position yourself as close as possible to reach work surface. Avoid straining your back.  Laundry - Unloading Wash   To unload small items at bottom of washer, lift leg opposite to arm being used to reach.  Gardening - Raking  Move close to area to be raked. Use arm movements to do the work. Keep back straight and avoid  twisting.     Cart  When reaching into cart with one arm, lift opposite leg to keep back straight.   Getting Into / Out of Bed  Lower self to lie down on one side by raising legs and lowering head at the same time. Use arms to assist moving without twisting. Bend both knees to roll onto back if desired. To sit up, start from lying on side, and use same move-ments in reverse. Housework - Vacuuming  Hold the vacuum with arm held at side. Step back and forth to move it, keeping head up. Avoid twisting.   Laundry - Armed forces training and education officerLoading Wash  Position laundry basket so that bending and twisting can be avoided.   Laundry - Unloading Dryer  Squat down to reach into clothes dryer or use a reacher.  Gardening - Weeding / Psychiatric nurselanting  Squat or Kneel. Knee pads may be helpful.                  Over Head Pull: Narrow Grip       On back, knees bent, feet flat, band across thighs, elbows straight but relaxed. Pull hands apart (start). Keeping elbows straight, bring arms up and over head, hands toward floor. Keep pull steady on band. Hold momentarily. Return slowly, keeping pull steady, back to start. Repeat __10 times. Band color ___  Yellow___   Side Pull: Double Arm   On back, knees bent, feet flat. Arms perpendicular to body, shoulder level, elbows straight but relaxed. Pull arms out to sides, elbows straight. Resistance band comes across collarbones, hands toward floor. Hold momentarily. Slowly return to starting position. Repeat __10_ times. Band color __Yellow___   Sash   On back, knees bent, feet flat, left hand on left hip, right hand above left. Pull right arm DIAGONALLY (hip to shoulder) across chest. Bring right arm along head toward floor. Hold momentarily. Slowly return to starting position. Repeat __10_ times. Do with left arm. Band color __Yellow____   Shoulder Rotation: Double Arm   On back, knees bent, feet flat, elbows tucked at sides, bent 90, hands palms up. Pull  hands apart and down toward floor, keeping elbows near sides. Hold momentarily. Slowly return to starting position. Repeat _10__ times. Band color ___Yellow___

## 2017-12-14 ENCOUNTER — Encounter: Payer: Self-pay | Admitting: Physical Therapy

## 2017-12-14 ENCOUNTER — Ambulatory Visit: Payer: 59 | Admitting: Physical Therapy

## 2017-12-14 DIAGNOSIS — M6281 Muscle weakness (generalized): Secondary | ICD-10-CM | POA: Diagnosis not present

## 2017-12-14 DIAGNOSIS — M542 Cervicalgia: Secondary | ICD-10-CM | POA: Diagnosis not present

## 2017-12-14 MED FILL — BROMOCRIPTINE 2.5 MG TABLET: 2.5 | 30 days supply | Qty: 15 | Fill #8

## 2017-12-14 MED FILL — UNIFINE PENTIPS 31GX3/16: 31G X 5 MM | 33 days supply | Qty: 100 | Fill #5

## 2017-12-14 MED FILL — LISINOPRIL 5 MG TABLET: 5 | 90 days supply | Qty: 90 | Fill #1

## 2017-12-14 MED FILL — LYRICA 150 MG CAPSULE: 150 | 30 days supply | Qty: 60 | Fill #2

## 2017-12-14 MED FILL — UNIFINE PENTIPS 31GX3/16": 31G X 5 MM | 33 days supply | Qty: 100 | Fill #5

## 2017-12-14 NOTE — Therapy (Signed)
[facility] [address] [place], [place], [address] Phone: [phone]   Fax:  [phone]  Physical Therapy Treatment  Patient Details  Name: Bridget [name] MRN: [id number] Date of Birth: 1960/12/11 Referring Provider: Peggye Ley    Encounter Date: 12/14/2017  PT End of Session - 12/14/17 0937    Visit Number  5    Number of Visits  17    Date for PT Re-Evaluation  01/22/18    PT Start Time  0803    PT Stop Time  0846    PT Time Calculation (min)  43 min    Activity Tolerance  Patient tolerated treatment well    Behavior During Therapy  [facility] for tasks assessed/performed       Past Medical History:  Diagnosis Date  . Bacterial vaginosis 12/07/2010  . Depression   . Diabetes mellitus without complication (HCC)   . Diverticulosis   . DIVERTICULOSIS, COLON 10/26/2006  . Fibromyalgia   . GERD (gastroesophageal reflux disease)   . Glaucoma   . Headache   . HEEL SPUR 09/10/2007   Qualifier: Diagnosis of  By: Lanier Prude  MD, Cathrine Muster    . Hypertension   . MALAISE AND FATIGUE 05/27/2010  . Obesity   . OBESITY, NOS 10/26/2006  . Osteoarthritis   . OSTEOARTHRITIS, KNEE 04/27/2010  . Retained tampon 05/21/2012  . SHOULDER PAIN, LEFT 05/19/2010   Qualifier: Diagnosis of  By: Clotilde Dieter MD, Amber    . Vaginal yeast infection 12/08/2010    Past Surgical History:  Procedure Laterality Date  . ABDOMINAL HYSTERECTOMY  1990  . ABDOMINAL HYSTERECTOMY    . BLADDER SURGERY  2001  . CATARACT EXTRACTION Right 2001   with implant  . EYE SURGERY     implant rt eye  . ROTATOR CUFF REPAIR Right   . SHOULDER ARTHROSCOPY WITH SUBACROMIAL DECOMPRESSION Left 07/20/2015   Procedure: SHOULDER ARTHROSCOPY WITH DEBRIDEMENT OF ROTATOR CUFF AND SUBACROMIAL DECOMPRESSION ;  Surgeon: Jones Broom, MD;  Location: [facility];  Service: Orthopedics;  Laterality: Left;  Left shoulder arthroscopy with debridement of rotator cuff and  subacromial decompression    There were no vitals filed for this visit.  Subjective Assessment - 12/14/17 0806    Subjective  I have been trying to sit with good posture.  We don't have good chairs at work.  We have computers we can raise.  I do not use headphones.   I often have to hold phone with my neck.      Currently in Pain?  Yes    Pain Score  6     Pain Location  Neck    Pain Orientation  Left    Pain Descriptors / Indicators  Tightness    Pain Radiating Towards  lt shoulder    Pain Frequency  Constant    Aggravating Factors   holding phone with neck at times,  stress.    Pain Relieving Factors  rest  PT helps until I work    Effect of Pain on Daily Activities  working                       [facility] Adult PT Treatment/Exercise - 12/14/17 0001      Self-Care   Posture  discussed stratigies at work      Shoulder Exercises: Supine   Horizontal ABduction  10 reps    Theraband Level (Shoulder Horizontal ABduction)  Level 2 (Red)  External Rotation  10 reps    Theraband Level (Shoulder External Rotation)  Level 2 (Red)    Flexion  10 reps    Theraband Level (Shoulder Flexion)  Level 2 (Red)    Flexion Limitations  narrow grip,  ,  no pain,  good ROM      Manual Therapy   Soft tissue mobilization  neuromuscular trigger point release upper trap left, soft tissue work Engineer, production,  levator  strumming and gentle upper trap and pec stretch in supine.  tissue softened.             PT Education - 12/14/17 0936    Education provided  Yes    Education Details  ADL posture ed.  work stratigies    Person(s) Educated  Patient    Methods  Explanation;Demonstration    Comprehension  Verbalized understanding       PT Short Term Goals - 12/14/17 1557      PT SHORT TERM GOAL #1   Title  Patient will report neck pain <2/10 with no radicular pain into the left shoulder.     Baseline  to shoulder    Time  4    Status  On-going      PT SHORT TERM GOAL #2   Title   Patient will increase bilateral gross shoulder strength to a 5/5 without pain.     Time  4    Period  Weeks    Status  Unable to assess      PT SHORT TERM GOAL #3   Title  Patient will display full cervical active range of motion in all planes without pain.    Baseline  worked on in supine today, not yet full  ROM    Time  4    Period  Weeks    Status  On-going        PT Long Term Goals - 11/27/17 1320      PT LONG TERM GOAL #1   Title  Patient will reach overhead into a cabinet with no increase in cervical pain in order to perform ADLs.     Baseline  Pain with reaching overhead     Time  8    Period  Weeks    Status  New    Target Date  01/22/18      PT LONG TERM GOAL #2   Title  Patient will work a full work day with reported neck pain <2/10.    Baseline  10/10 pain at the end of work day     Time  8    Period  Weeks    Status  New    Target Date  01/22/18      PT LONG TERM GOAL #3   Title  Patient will walk pet longer than 30 mins without reported increase in left shoulder pain.     Baseline  pain >30 mins     Time  8    Period  Weeks    Status  New    Target Date  01/22/18            Plan - 12/14/17 1554    Clinical Impression Statement  Work conditions are stressful and include poor posture with use of multiple phones.  head phone jack made her hair thin so she does not use.    She was able to do HEP without increased pain with upgrade to red band.  Less tight at end of session.  PT Next Visit Plan  Grade 3-4 Cervical PA mobs; Scapular strengthenin;() PROM; Manual cervical stretches; Manual Cervical traction; Trigger point release to L upper trap and cervical paraspinals; ,  try cervical stabilization exercises with head press.    PT Home Exercise Plan  Seated levator stretch; seated upper trap stretch; self trigger point release (Sheppards Hook)Supine band:  ER, narrow grip flexion, sash and horizontal abduction    Consulted and Agree with Plan of Care   Patient       Patient will benefit from skilled therapeutic intervention in order to improve the following deficits and impairments:     Visit Diagnosis: Cervicalgia  Muscle weakness (generalized)     Problem List Patient Active Problem List   Diagnosis Date Noted  . Right hand weakness 09/18/2017  . Plantar fasciitis 10/10/2016  . Foot pain, bilateral 09/09/2016  . Radiculopathy 09/09/2016  . GERD (gastroesophageal reflux disease) 09/09/2014  . Hyperlipidemia 05/24/2013  . Lung nodule seen on imaging study 09/21/2012  . Chronic pain of multiple sites - Knees, Ankles, Back 11/09/2011  . Lupus (systemic lupus erythematosus) (HCC) 06/28/2011  . HYPOTHYROIDISM 04/27/2010  . Depression 04/27/2010  . OSTEOARTHRITIS, KNEE 04/27/2010  . Diabetes mellitus type 2, uncontrolled, with complications (HCC) 08/14/2007  . Obesity 10/26/2006  . HYPERTENSION, BENIGN SYSTEMIC 10/26/2006  . VENOUS INSUFFICIENCY, CHRONIC 10/26/2006    Jerson Furukawa PTA 12/14/2017, 4:00 PM  [facility] [address] [place], [place], [address] Phone: [phone]   Fax:  [phone]  Name: Bridget [name] MRN: [id number] Date of Birth: 1961-01-24

## 2017-12-19 ENCOUNTER — Encounter: Payer: Self-pay | Admitting: Physical Therapy

## 2017-12-19 ENCOUNTER — Ambulatory Visit: Payer: 59 | Admitting: Physical Therapy

## 2017-12-19 DIAGNOSIS — M6281 Muscle weakness (generalized): Secondary | ICD-10-CM | POA: Diagnosis not present

## 2017-12-19 DIAGNOSIS — M542 Cervicalgia: Secondary | ICD-10-CM | POA: Diagnosis not present

## 2017-12-19 NOTE — Therapy (Signed)
[place], [place], [address] Phone: [phone]   Fax:  [phone]  Physical Therapy Treatment  Patient Details  Name: Bridget [name] MRN: [id number] Date of Birth: [birth year] Referring Provider: Frederich Cha    Encounter Date: 12/19/2017  PT End of Session - 12/19/17 0842    Visit Number  6    Number of Visits  17    Date for PT Re-Evaluation  01/22/18    PT Start Time  0801    PT Stop Time  9675    PT Time Calculation (min)  46 min    Activity Tolerance  Patient tolerated treatment well    Behavior During Therapy  [facility] for tasks assessed/performed       Past Medical History:  Diagnosis Date  . Bacterial vaginosis 12/07/2010  . Depression   . Diabetes mellitus without complication ([place])   . Diverticulosis   . DIVERTICULOSIS, COLON 10/26/2006  . Fibromyalgia   . GERD (gastroesophageal reflux disease)   . Glaucoma   . Headache   . HEEL SPUR 09/10/2007   Qualifier: Diagnosis of  By: Drue Flirt  MD, Merrily Brittle    . Hypertension   . MALAISE AND FATIGUE 05/27/2010  . Obesity   . OBESITY, NOS 10/26/2006  . Osteoarthritis   . OSTEOARTHRITIS, KNEE 04/27/2010  . Retained tampon 05/21/2012  . SHOULDER PAIN, LEFT 05/19/2010   Qualifier: Diagnosis of  By: Annamary Carolin MD, Amber    . Vaginal yeast infection 12/08/2010    Past Surgical History:  Procedure Laterality Date  . ABDOMINAL HYSTERECTOMY  1990  . ABDOMINAL HYSTERECTOMY    . BLADDER SURGERY  2001  . CATARACT EXTRACTION Right 2001   with implant  . EYE SURGERY     implant rt eye  . ROTATOR CUFF REPAIR Right   . SHOULDER ARTHROSCOPY WITH SUBACROMIAL DECOMPRESSION Left 07/20/2015   Procedure: SHOULDER ARTHROSCOPY WITH DEBRIDEMENT OF ROTATOR CUFF AND SUBACROMIAL DECOMPRESSION ;  Surgeon: Tania Ade, MD;  Location: [place];  Service: Orthopedics;  Laterality: Left;  Left shoulder arthroscopy with debridement of rotator cuff and  subacromial decompression    There were no vitals filed for this visit.  Subjective Assessment - 12/19/17 0823    Subjective  I have a Headach.  Pt is helping my motion.    Currently in Pain?  Yes    Pain Score  5     Pain Location  Neck    Pain Orientation  Left    Pain Descriptors / Indicators  Tightness;Headache    Pain Radiating Towards  to shoulder    Aggravating Factors   stress,  sinus    Pain Relieving Factors  rest ,  PT         [facility] PT Assessment - 12/19/17 0001      AROM   Cervical Flexion  -- WNL  no pain    Cervical Extension  -- WNL  no pain    Cervical - Right Side Bend  32    Cervical - Left Side Bend  42    Cervical - Right Rotation  76 NO pain    Cervical - Left Rotation  76 No pain      Strength   Right Shoulder Flexion  4/5    Right Shoulder Internal Rotation  5/5    Right Shoulder External Rotation  4+/5    Left Shoulder Flexion  4/5    Left Shoulder Internal Rotation  5/5  Left Shoulder External Rotation  4+/5                   [facility] Adult PT Treatment/Exercise - 12/19/17 0001      Shoulder Exercises: Supine   Horizontal ABduction  10 reps    Theraband Level (Shoulder Horizontal ABduction)  Level 2 (Red)    External Rotation  10 reps    Theraband Level (Shoulder External Rotation)  Level 2 (Red)    Flexion  10 reps    Theraband Level (Shoulder Flexion)  Level 2 (Red)    Flexion Limitations  narrow grip,  ,  no pain,  good ROM      Shoulder Exercises: ROM/Strengthening   UBE (Upper Arm Bike)  L1 x 4 minutes,  forward,  no pain      Manual Therapy   Soft tissue mobilization  softtissue work neck , base of skull,  upper trap and strumming to lengthen    Edema upper back reduced.             PT Education - 12/19/17 [phone]    Education provided  Yes    Education Details  exercise forms    Person(s) Educated  Patient    Methods  Explanation;Demonstration;Verbal cues    Comprehension  Returned demonstration       PT  Short Term Goals - 12/19/17 0835      PT SHORT TERM GOAL #1   Title  Patient will report neck pain <2/10 with no radicular pain into the left shoulder.     Baseline  5/10    Time  4    Period  Weeks    Status  On-going      PT SHORT TERM GOAL #2   Title  Patient will increase bilateral gross shoulder strength to a 5/5 without pain.     Baseline  4+/5  no pain    Time  4    Period  Weeks    Status  On-going      PT SHORT TERM GOAL #3   Title  Patient will display full cervical active range of motion in all planes without pain.    Baseline  Not yet Full with lateral flexion   32 LT, 42 right all others  WNL    Time  4    Period  Weeks    Status  Partially Met        PT Long Term Goals - 11/27/17 1320      PT LONG TERM GOAL #1   Title  Patient will reach overhead into a cabinet with no increase in cervical pain in order to perform ADLs.     Baseline  Pain with reaching overhead     Time  8    Period  Weeks    Status  New    Target Date  01/22/18      PT LONG TERM GOAL #2   Title  Patient will work a full work day with reported neck pain <2/10.    Baseline  10/10 pain at the end of work day     Time  8    Period  Weeks    Status  New    Target Date  01/22/18      PT LONG TERM GOAL #3   Title  Patient will walk pet longer than 30 mins without reported increase in left shoulder pain.     Baseline  pain >30 mins  Time  8    Period  Weeks    Status  New    Target Date  01/22/18            Plan - 12/19/17 1501    Clinical Impression Statement  No pain increased during exercise with red band today,  Red band continues to be challanging.  ROM cervicalimproving with Flexion, extension and rotations WNL and pain free.  lateral flexionLT 32 and right 42 degrees.  Shoulder strength also continues to improve,  see flow sheet.    PT Next Visit Plan  Grade 3-4 Cervical PA mobs; try cervical stabilization exercises with head press.      PT Home Exercise Plan  Seated  levator stretch; seated upper trap stretch; self trigger point release (Sheppards Hook)Supine band:  ER, narrow grip flexion, sash and horizontal abduction    Consulted and Agree with Plan of Care  Patient       Patient will benefit from skilled therapeutic intervention in order to improve the following deficits and impairments:     Visit Diagnosis: Cervicalgia  Muscle weakness (generalized)     Problem List Patient Active Problem List   Diagnosis Date Noted  . Right hand weakness 09/18/2017  . Plantar fasciitis 10/10/2016  . Foot pain, bilateral 09/09/2016  . Radiculopathy 09/09/2016  . GERD (gastroesophageal reflux disease) 09/09/2014  . Hyperlipidemia 05/24/2013  . Lung nodule seen on imaging study 09/21/2012  . Chronic pain of multiple sites - Knees, Ankles, Back 11/09/2011  . Lupus (systemic lupus erythematosus) ([place]) 06/28/2011  . HYPOTHYROIDISM 04/27/2010  . Depression 04/27/2010  . OSTEOARTHRITIS, KNEE 04/27/2010  . Diabetes mellitus type 2, uncontrolled, with complications ([place]) 90/70/7217  . Obesity 10/26/2006  . HYPERTENSION, BENIGN SYSTEMIC 10/26/2006  . VENOUS INSUFFICIENCY, CHRONIC 10/26/2006    Deny Chevez  PTA 12/19/2017, 3:05 PM  [facility] [address] [place], [place], [address] Phone: [phone]   Fax:  [phone]  Name: Bridget [name] MRN: [id number] Date of Birth: [birth year]

## 2017-12-22 ENCOUNTER — Encounter: Payer: Self-pay | Admitting: Physical Therapy

## 2017-12-22 ENCOUNTER — Ambulatory Visit: Payer: 59 | Admitting: Physical Therapy

## 2017-12-22 DIAGNOSIS — M542 Cervicalgia: Secondary | ICD-10-CM

## 2017-12-22 DIAGNOSIS — M6281 Muscle weakness (generalized): Secondary | ICD-10-CM | POA: Diagnosis not present

## 2017-12-22 NOTE — Therapy (Addendum)
[place], [place], [address] Phone: [phone]   Fax:  [phone]  Physical Therapy Treatment  Patient Details  Name: Bridget [name] MRN: [id number] Date of Birth: [birth year] Referring Provider: Frederich Cha    Encounter Date: 12/22/2017  PT End of Session - 12/22/17 0912    Visit Number  7    Number of Visits  17    Date for PT Re-Evaluation  01/22/18    Authorization Type  [facility] [employer]    PT Start Time  0855    PT Stop Time  0933    PT Time Calculation (min)  38 min    Activity Tolerance  Patient tolerated treatment well    Behavior During Therapy  [facility] for tasks assessed/performed       Past Medical History:  Diagnosis Date  . Bacterial vaginosis 12/07/2010  . Depression   . Diabetes mellitus without complication ([place])   . Diverticulosis   . DIVERTICULOSIS, COLON 10/26/2006  . Fibromyalgia   . GERD (gastroesophageal reflux disease)   . Glaucoma   . Headache   . HEEL SPUR 09/10/2007   Qualifier: Diagnosis of  By: Drue Flirt  MD, Merrily Brittle    . Hypertension   . MALAISE AND FATIGUE 05/27/2010  . Obesity   . OBESITY, NOS 10/26/2006  . Osteoarthritis   . OSTEOARTHRITIS, KNEE 04/27/2010  . Retained tampon 05/21/2012  . SHOULDER PAIN, LEFT 05/19/2010   Qualifier: Diagnosis of  By: Annamary Carolin MD, Amber    . Vaginal yeast infection 12/08/2010    Past Surgical History:  Procedure Laterality Date  . ABDOMINAL HYSTERECTOMY  1990  . ABDOMINAL HYSTERECTOMY    . BLADDER SURGERY  2001  . CATARACT EXTRACTION Right 2001   with implant  . EYE SURGERY     implant rt eye  . ROTATOR CUFF REPAIR Right   . SHOULDER ARTHROSCOPY WITH SUBACROMIAL DECOMPRESSION Left 07/20/2015   Procedure: SHOULDER ARTHROSCOPY WITH DEBRIDEMENT OF ROTATOR CUFF AND SUBACROMIAL DECOMPRESSION ;  Surgeon: Tania Ade, MD;  Location: [place];  Service: Orthopedics;  Laterality: Left;  Left shoulder arthroscopy with  debridement of rotator cuff and subacromial decompression    There were no vitals filed for this visit.  Subjective Assessment - 12/22/17 0910    Subjective  Patient has just come from work. She reports her pain level is about a 3-4/10. The pain is mostly on the left. she does not have a headache today.     Limitations  Sitting;Lifting;House hold activities    Diagnostic tests  MRI: c6-c7 Moderate disc buldge with potential C7 nerve root impingement     Patient Stated Goals  Working without pain; ADLs without pain; Walking dogs without pain     Currently in Pain?  Yes    Pain Score  4     Pain Location  Neck    Pain Orientation  Left    Pain Descriptors / Indicators  Headache;Tightness    Pain Type  Chronic pain    Pain Radiating Towards  to the shoulder     Pain Onset  More than a month ago    Pain Frequency  Constant    Aggravating Factors   stress     Pain Relieving Factors  rest; PT     Effect of Pain on Daily Activities  working  [place] Adult PT Treatment/Exercise - 12/22/17 0001      Shoulder Exercises: Supine   Horizontal ABduction  10 reps    Theraband Level (Shoulder Horizontal ABduction)  Level 3 (Green)    External Rotation  10 reps    Theraband Level (Shoulder External Rotation)  Level 3 (Green)    Flexion  10 reps    Theraband Level (Shoulder Flexion)  Level 2 (Red)    Flexion Limitations  narrow grip,  ,  no pain,  good ROM      Shoulder Exercises: Standing   Extension  10 reps    Theraband Level (Shoulder Extension)  Level 2 (Red)    Row Limitations  2x10; red TB      Manual Therapy   Soft tissue mobilization  softtissue work neck , base of skull,  upper trap using IASTM    Edema upper back reduced.             PT Education - 12/22/17 0911    Education provided  Yes    Education Details  reviewed tehcnique with exercises     Person(s) Educated  Patient    Methods  Explanation;Demonstration;Tactile cues;Verbal  cues    Comprehension  Verbalized understanding;Returned demonstration;Verbal cues required;Tactile cues required       PT Short Term Goals - 12/19/17 0835      PT SHORT TERM GOAL #1   Title  Patient will report neck pain <2/10 with no radicular pain into the left shoulder.     Baseline  5/10    Time  4    Period  Weeks    Status  On-going      PT SHORT TERM GOAL #2   Title  Patient will increase bilateral gross shoulder strength to a 5/5 without pain.     Baseline  4+/5  no pain    Time  4    Period  Weeks    Status  On-going      PT SHORT TERM GOAL #3   Title  Patient will display full cervical active range of motion in all planes without pain.    Baseline  Not yet Full with lateral flexion   32 LT, 42 right all others  WNL    Time  4    Period  Weeks    Status  Partially Met        PT Long Term Goals - 11/27/17 1320      PT LONG TERM GOAL #1   Title  Patient will reach overhead into a cabinet with no increase in cervical pain in order to perform ADLs.     Baseline  Pain with reaching overhead     Time  8    Period  Weeks    Status  New    Target Date  01/22/18      PT LONG TERM GOAL #2   Title  Patient will work a full work day with reported neck pain <2/10.    Baseline  10/10 pain at the end of work day     Time  8    Period  Weeks    Status  New    Target Date  01/22/18      PT LONG TERM GOAL #3   Title  Patient will walk pet longer than 30 mins without reported increase in left shoulder pain.     Baseline  pain >30 mins     Time  8  Period  Weeks    Status  New    Target Date  01/22/18            Plan - 12/22/17 0913    Clinical Impression Statement  Therapy continued with soft tissue work using IASTM to patient's cervical region. Therapy increased theraband intensity and added standing postural strengthening exercises. Patient tolerated all exercises well and reported no increase in pain after therapy visit.     Clinical Presentation  Stable     Clinical Decision Making  Low    Rehab Potential  Good    PT Frequency  2x / week    PT Duration  8 weeks    PT Treatment/Interventions  ADLs/Self Care Home Management;Electrical Stimulation;Iontophoresis 4m/ml Dexamethasone;Moist Heat;Traction;Ultrasound;Therapeutic activities;Therapeutic exercise;Neuromuscular re-education;Patient/family education;Manual techniques;Passive range of motion;Dry needling;Taping;Vasopneumatic Device    PT Next Visit Plan  Grade 3-4 Cervical PA mobs; try cervical stabilization exercises with head press.      PT Home Exercise Plan  Seated levator stretch; seated upper trap stretch; self trigger point release (Sheppards Hook)Supine band:  ER, narrow grip flexion, sash and horizontal abduction    Consulted and Agree with Plan of Care  Patient       Patient will benefit from skilled therapeutic intervention in order to improve the following deficits and impairments:  Hypomobility, Decreased strength, Pain, Increased muscle spasms, Decreased range of motion  Visit Diagnosis: Cervicalgia  Muscle weakness (generalized)     Problem List Patient Active Problem List   Diagnosis Date Noted  . Right hand weakness 09/18/2017  . Plantar fasciitis 10/10/2016  . Foot pain, bilateral 09/09/2016  . Radiculopathy 09/09/2016  . GERD (gastroesophageal reflux disease) 09/09/2014  . Hyperlipidemia 05/24/2013  . Lung nodule seen on imaging study 09/21/2012  . Chronic pain of multiple sites - Knees, Ankles, Back 11/09/2011  . Lupus ([place]ic [place]mat[place]) [place] 06/28/2011  . HYPOTHYROIDISM 04/27/2010  . Depression 04/27/2010  . OSTEOARTHRITIS, KNEE 04/27/2010  . Diabetes mellitus type 2, uncontrolled, with complications ([place] 116/08/930 . Obesity 10/26/2006  . HYPERTENSION, BENIGN SYSTEMIC 10/26/2006  . VENOUS INSUFFICIENCY, CHRONIC 10/26/2006   DCarolyne LittlesPT DPT  12/22/2017 10:47 PM  NCooper RenderSPT  12/22/2017, 10:47 AM  [facility] [address]Phone: [phone]  Fax:  [phone] Name: Bridget SOMMERFIELDMRN: [id number] of Birth: [birth year]

## 2017-12-27 ENCOUNTER — Ambulatory Visit: Payer: 59 | Attending: Neurosurgery | Admitting: Physical Therapy

## 2017-12-27 ENCOUNTER — Encounter: Payer: Self-pay | Admitting: Physical Therapy

## 2017-12-27 DIAGNOSIS — R262 Difficulty in walking, not elsewhere classified: Secondary | ICD-10-CM | POA: Insufficient documentation

## 2017-12-27 DIAGNOSIS — R600 Localized edema: Secondary | ICD-10-CM | POA: Diagnosis not present

## 2017-12-27 DIAGNOSIS — M6281 Muscle weakness (generalized): Secondary | ICD-10-CM | POA: Diagnosis not present

## 2017-12-27 DIAGNOSIS — M25671 Stiffness of right ankle, not elsewhere classified: Secondary | ICD-10-CM | POA: Diagnosis not present

## 2017-12-27 DIAGNOSIS — M25571 Pain in right ankle and joints of right foot: Secondary | ICD-10-CM | POA: Insufficient documentation

## 2017-12-27 DIAGNOSIS — M542 Cervicalgia: Secondary | ICD-10-CM | POA: Diagnosis not present

## 2017-12-27 NOTE — Therapy (Signed)
[place], [place], [address] Phone: [phone]   Fax:  [phone]  Physical Therapy Treatment  Patient Details  Name: Bridget [name] MRN: [id number] Date of Birth: Apr 02, 1961 Referring Provider: Frederich Cha    Encounter Date: 12/27/2017  PT End of Session - 12/27/17 0807    Visit Number  8    Number of Visits  17    Date for PT Re-Evaluation  01/22/18    Authorization Type  [employer]    PT Start Time  0800    PT Stop Time  0842    PT Time Calculation (min)  42 min    Activity Tolerance  Patient tolerated treatment well    Behavior During Therapy  [facility] for tasks assessed/performed       Past Medical History:  Diagnosis Date  . Bacterial vaginosis 12/07/2010  . Depression   . Diabetes mellitus without complication ([place])   . Diverticulosis   . DIVERTICULOSIS, COLON 10/26/2006  . Fibromyalgia   . GERD (gastroesophageal reflux disease)   . Glaucoma   . Headache   . HEEL SPUR 09/10/2007   Qualifier: Diagnosis of  By: Drue Flirt  MD, Merrily Brittle    . Hypertension   . MALAISE AND FATIGUE 05/27/2010  . Obesity   . OBESITY, NOS 10/26/2006  . Osteoarthritis   . OSTEOARTHRITIS, KNEE 04/27/2010  . Retained tampon 05/21/2012  . SHOULDER PAIN, LEFT 05/19/2010   Qualifier: Diagnosis of  By: Annamary Carolin MD, Amber    . Vaginal yeast infection 12/08/2010    Past Surgical History:  Procedure Laterality Date  . ABDOMINAL HYSTERECTOMY  1990  . ABDOMINAL HYSTERECTOMY    . BLADDER SURGERY  2001  . CATARACT EXTRACTION Right 2001   with implant  . EYE SURGERY     implant rt eye  . ROTATOR CUFF REPAIR Right   . SHOULDER ARTHROSCOPY WITH SUBACROMIAL DECOMPRESSION Left 07/20/2015   Procedure: SHOULDER ARTHROSCOPY WITH DEBRIDEMENT OF ROTATOR CUFF AND SUBACROMIAL DECOMPRESSION ;  Surgeon: Tania Ade, MD;  Location: [facility];  Service: Orthopedics;  Laterality: Left;  Left shoulder arthroscopy with  debridement of rotator cuff and subacromial decompression    There were no vitals filed for this visit.  Subjective Assessment - 12/27/17 0805    Subjective  Patient reports that she is no having much pain in her neck but it feels more stiff. She iscoming from work     Limitations  Sitting;Lifting;House hold activities    Diagnostic tests  MRI: c6-c7 Moderate disc buldge with potential C7 nerve root impingement     Patient Stated Goals  Working without pain; ADLs without pain; Walking dogs without pain     Pain Score  4     Pain Location  Neck    Pain Orientation  Left    Pain Onset  More than a month ago    Pain Frequency  Constant    Aggravating Factors   stress     Pain Relieving Factors  rest,                        [facility] Adult PT Treatment/Exercise - 12/27/17 0001      Shoulder Exercises: Supine   Horizontal ABduction  10 reps    Theraband Level (Shoulder Horizontal ABduction)  Level 3 (Green)    External Rotation  10 reps    Theraband Level (Shoulder External Rotation)  Level 3 (Green)  Flexion  10 reps    Theraband Level (Shoulder Flexion)  Level 2 (Red)    Flexion Limitations  narrow grip,  ,  no pain,  good ROM      Shoulder Exercises: Standing   Extension  10 reps    Theraband Level (Shoulder Extension)  Level 2 (Red)    Row Limitations  2x10; red TB      Shoulder Exercises: ROM/Strengthening   UBE (Upper Arm Bike)  L1; 3 mins forward x 3 mins retro       Manual Therapy   Soft tissue mobilization  softtissue work neck , base of skull,  upper trap using IASTM    Edema upper back reduced.             PT Education - 12/27/17 [phone]    Education provided  Yes    Education Details  reviewed HEP and ther-ex     Person(s) Educated  Patient    Methods  Explanation;Demonstration;Tactile cues;Verbal cues    Comprehension  Verbalized understanding;Returned demonstration;Verbal cues required;Tactile cues required       PT Short Term Goals -  12/19/17 0835      PT SHORT TERM GOAL #1   Title  Patient will report neck pain <2/10 with no radicular pain into the left shoulder.     Baseline  5/10    Time  4    Period  Weeks    Status  On-going      PT SHORT TERM GOAL #2   Title  Patient will increase bilateral gross shoulder strength to a 5/5 without pain.     Baseline  4+/5  no pain    Time  4    Period  Weeks    Status  On-going      PT SHORT TERM GOAL #3   Title  Patient will display full cervical active range of motion in all planes without pain.    Baseline  Not yet Full with lateral flexion   32 LT, 42 right all others  WNL    Time  4    Period  Weeks    Status  Partially Met        PT Long Term Goals - 11/27/17 1320      PT LONG TERM GOAL #1   Title  Patient will reach overhead into a cabinet with no increase in cervical pain in order to perform ADLs.     Baseline  Pain with reaching overhead     Time  8    Period  Weeks    Status  New    Target Date  01/22/18      PT LONG TERM GOAL #2   Title  Patient will work a full work day with reported neck pain <2/10.    Baseline  10/10 pain at the end of work day     Time  8    Period  Weeks    Status  New    Target Date  01/22/18      PT LONG TERM GOAL #3   Title  Patient will walk pet longer than 30 mins without reported increase in left shoulder pain.     Baseline  pain >30 mins     Time  8    Period  Weeks    Status  New    Target Date  01/22/18            Plan - 12/27/17 4696  PT Treatment/Interventions  ADLs/Self Care Home Management;Electrical Stimulation;Iontophoresis 74m/ml Dexamethasone;Moist Heat;Traction;Ultrasound;Therapeutic activities;Therapeutic exercise;Neuromuscular re-education;Patient/family education;Manual techniques;Passive range of motion;Dry needling;Taping;Vasopneumatic Device    PT Next Visit Plan  Grade 3-4 Cervical PA mobs; try cervical stabilization exercises with head press.      PT Home Exercise Plan  Seated levator  stretch; seated upper trap stretch; self trigger point release ([place])Supine band:  ER, narrow grip flexion, sash and horizontal abduction    Consulted and Agree with Plan of Care  Patient       Patient will benefit from skilled therapeutic intervention in order to improve the following deficits and impairments:     Visit Diagnosis: Cervicalgia  Muscle weakness (generalized)  Pain in right ankle and joints of right foot  Stiffness of right ankle, not elsewhere classified  Localized edema  Difficulty in walking, not elsewhere classified     Problem List Patient Active Problem List   Diagnosis Date Noted  . Right hand weakness 09/18/2017  . Plantar fasciitis 10/10/2016  . Foot pain, bilateral 09/09/2016  . Radiculopathy 09/09/2016  . GERD (gastroesophageal reflux disease) 09/09/2014  . Hyperlipidemia 05/24/2013  . Lung nodule seen on imaging study 09/21/2012  . Chronic pain of multiple sites - Knees, Ankles, Back 11/09/2011  . Lupus (systemic lupus erythematosus) ([place] 06/28/2011  . HYPOTHYROIDISM 04/27/2010  . Depression 04/27/2010  . OSTEOARTHRITIS, KNEE 04/27/2010  . Diabetes mellitus type 2, uncontrolled, with complications ([place] 137/29/0211 . Obesity 10/26/2006  . HYPERTENSION, BENIGN SYSTEMIC 10/26/2006  . VENOUS INSUFFICIENCY, CHRONIC 10/26/2006    DCarney LivingPT DPT  12/27/2017, 9:23 AM  NCooper RenderSPT  12/27/2017 09:23   During this treatment session, the therapist was present, participating in and directing the treatment.   [place] [address]Phone: [phone]  Fax:  [phone] Name: PJOLICIA DELIRAMRN: [id number] of Birth: [birth year]

## 2017-12-28 MED FILL — LUMIGAN 0.01% EYE DROPS: 0.01 | 30 days supply | Qty: 3 | Fill #0

## 2017-12-29 ENCOUNTER — Ambulatory Visit: Payer: 59 | Admitting: Physical Therapy

## 2017-12-29 ENCOUNTER — Encounter: Payer: Self-pay | Admitting: Physical Therapy

## 2017-12-29 DIAGNOSIS — R600 Localized edema: Secondary | ICD-10-CM | POA: Diagnosis not present

## 2017-12-29 DIAGNOSIS — R262 Difficulty in walking, not elsewhere classified: Secondary | ICD-10-CM | POA: Diagnosis not present

## 2017-12-29 DIAGNOSIS — M542 Cervicalgia: Secondary | ICD-10-CM | POA: Diagnosis not present

## 2017-12-29 DIAGNOSIS — M6281 Muscle weakness (generalized): Secondary | ICD-10-CM | POA: Diagnosis not present

## 2017-12-29 DIAGNOSIS — M25671 Stiffness of right ankle, not elsewhere classified: Secondary | ICD-10-CM | POA: Diagnosis not present

## 2017-12-29 DIAGNOSIS — M25571 Pain in right ankle and joints of right foot: Secondary | ICD-10-CM | POA: Diagnosis not present

## 2017-12-29 MED FILL — TRESIBA FLEXTOUCH 100 UNITS: 100 | 25 days supply | Qty: 15 | Fill #7

## 2017-12-29 NOTE — Therapy (Signed)
[place], [place], [address] Phone: [phone]   Fax:  [phone]  Physical Therapy Treatment/ Discharge   Patient Details  Name: Bridget [name] MRN: [id number] Date of Birth: [birth year] Referring Provider: Frederich Cha    Encounter Date: 12/29/2017  PT End of Session - 12/29/17 0759    Visit Number  9    Number of Visits  17    Date for PT Re-Evaluation  01/22/18    Authorization Type  [facility] [employer]    PT Start Time  0800    PT Stop Time  0842    PT Time Calculation (min)  42 min    Activity Tolerance  Patient tolerated treatment well    Behavior During Therapy  [facility] for tasks assessed/performed       Past Medical History:  Diagnosis Date  . Bacterial vaginosis 12/07/2010  . Depression   . Diabetes mellitus without complication ([place])   . Diverticulosis   . DIVERTICULOSIS, COLON 10/26/2006  . Fibromyalgia   . GERD (gastroesophageal reflux disease)   . Glaucoma   . Headache   . HEEL SPUR 09/10/2007   Qualifier: Diagnosis of  By: Drue Flirt  MD, Merrily Brittle    . Hypertension   . MALAISE AND FATIGUE 05/27/2010  . Obesity   . OBESITY, NOS 10/26/2006  . Osteoarthritis   . OSTEOARTHRITIS, KNEE 04/27/2010  . Retained tampon 05/21/2012  . SHOULDER PAIN, LEFT 05/19/2010   Qualifier: Diagnosis of  By: Annamary Carolin MD, Amber    . Vaginal yeast infection 12/08/2010    Past Surgical History:  Procedure Laterality Date  . ABDOMINAL HYSTERECTOMY  1990  . ABDOMINAL HYSTERECTOMY    . BLADDER SURGERY  2001  . CATARACT EXTRACTION Right 2001   with implant  . EYE SURGERY     implant rt eye  . ROTATOR CUFF REPAIR Right   . SHOULDER ARTHROSCOPY WITH SUBACROMIAL DECOMPRESSION Left 07/20/2015   Procedure: SHOULDER ARTHROSCOPY WITH DEBRIDEMENT OF ROTATOR CUFF AND SUBACROMIAL DECOMPRESSION ;  Surgeon: Tania Ade, MD;  Location: [facility];  Service: Orthopedics;  Laterality: Left;  Left shoulder arthroscopy  with debridement of rotator cuff and subacromial decompression    There were no vitals filed for this visit.  Subjective Assessment - 12/29/17 0814    Subjective  Patient reports more pain when she is at work. Overall her pain levels have decreased but she is still having pain when she has long days at work. 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 She has been working on her exercises and stretches. She feels like she is ready to work on her program independently.     Limitations  Sitting;Lifting;House hold activities    Diagnostic tests  MRI: c6-c7 Moderate disc buldge with potential C7 nerve root impingement     Patient Stated Goals  Working without pain; ADLs without pain; Walking dogs without pain     Currently in Pain?  Yes    Pain Score  2     Pain Onset  More than a month ago    Pain Frequency  Constant    Aggravating Factors   stress     Pain Relieving Factors  rest     Effect of Pain on Daily Activities  working          [facility] PT Assessment - 12/29/17 0001      Observation/Other Assessments   Focus on Therapeutic Outcomes (FOTO)  36% limitation       AROM   Cervical Flexion  45    Cervical Extension  52    Cervical - Right Side Bend  40    Cervical - Left Side Bend  42    Cervical - Right Rotation  80    Cervical - Left Rotation  78                   [facility] Adult PT Treatment/Exercise - 12/29/17 0001      Shoulder Exercises: Supine   Horizontal ABduction  10 reps    Theraband Level (Shoulder Horizontal ABduction)  Level 3 (Green)    External Rotation  10 reps    Theraband Level (Shoulder External Rotation)  Level 3 (Green)    Flexion  10 reps    Theraband Level (Shoulder Flexion)  Level 2 (Red)    Flexion Limitations  narrow grip,  ,  no pain,  good ROM      Shoulder Exercises: Standing   Extension  10 reps    Theraband Level (Shoulder Extension)  Level 2 (Red)    Row Limitations  2x10; red TB    Other Standing  Exercises  Ys w 1lb; 2x10; cues to not raise shoulders      Shoulder Exercises: ROM/Strengthening   UBE (Upper Arm Bike)  L1; 3 mins forward x 3 mins retro       Manual Therapy   Soft tissue mobilization  softtissue work neck , base of skull,  upper trap using IASTM    Edema upper back reduced.             PT Education - 12/29/17 0759    Education provided  Yes    Education Details  reviewed HEP for independent symptom management     Person(s) Educated  Patient    Methods  Explanation;Demonstration;Tactile cues;Verbal cues    Comprehension  Verbalized understanding;Returned demonstration       PT Short Term Goals - 12/29/17 0818      PT SHORT TERM GOAL #1   Title  Patient will report neck pain <2/10 with no radicular pain into the left shoulder.     Baseline  pain can reach a 5/10 at workbut is 2/10 this morning. Pain no longer running into her shoulder     Time  4    Period  Weeks    Status  Partially Met    Target Date  12/25/17      PT SHORT TERM GOAL #2   Title  Patient will increase bilateral gross shoulder strength to a 5/5 without pain.     Baseline  4+/5      Time  4    Period  Weeks    Target Date  12/25/17      PT SHORT TERM GOAL #3   Title  Patient will display full cervical active range of motion in all planes without pain.    Baseline  Rotation, flexion, and extension WNL    Time  4    Period  Weeks    Status  Achieved    Target Date  12/25/17        PT Long Term Goals - 12/29/17 0819      PT LONG TERM GOAL #1   Title  Patient will reach overhead into a cabinet with no increase in cervical pain in order to perform ADLs.     Baseline  Not having as much pain reaching  into a cabinet     Time  8    Period  Weeks    Status  Partially Met      PT LONG TERM GOAL #2   Title  Pain still reaching a 4-5/10 at work     Time  8    Period  Weeks    Status  Not Met      PT LONG TERM GOAL #3   Title  Patient will walk pet longer than 30 mins without  reported increase in left shoulder pain.     Baseline  Is able to walk further without pain     Time  8    Period  Weeks    Status  Achieved      PT LONG TERM GOAL #4   Title  FOTO to 46% ability to indicate significant improvement in functional ability    Baseline  36% disability     Time  8    Period  Weeks    Status  Achieved            Plan - 12/29/17 0759    Clinical Impression Statement  Therapy reviewed all HEP exercises for patient discharge. Patient reports feeling comfortable with all exercises and is ready for discharge. Patient is still concerned about her work [employer] and how her computer desk is set up. Therapy provided patient with information to reached out to [facility] [employer] for ergonomic correction of her desk at work.. She has not met all goals at this time but she is moving towards her goals and feels commofrtable with her home program. She may not be pain free until her work space is adressed.     Clinical Presentation  Stable    Clinical Decision Making  Low    Rehab Potential  Good    PT Frequency  2x / week    PT Duration  8 weeks    PT Treatment/Interventions  ADLs/Self Care Home Management;Electrical Stimulation;Iontophoresis 67m/ml Dexamethasone;Moist Heat;Traction;Ultrasound;Therapeutic activities;Therapeutic exercise;Neuromuscular re-education;Patient/family education;Manual techniques;Passive range of motion;Dry needling;Taping;Vasopneumatic Device    PT Next Visit Plan  D/C to HEP     PT Home Exercise Plan  Seated levator stretch; seated upper trap stretch; self trigger point release ([place])Supine band:  ER, narrow grip flexion, sash and horizontal abduction    Consulted and Agree with Plan of Care  Patient       Patient will benefit from skilled therapeutic intervention in order to improve the following deficits and impairments:  Hypomobility, Decreased strength, Pain, Increased muscle spasms, Decreased range of motion  Visit  Diagnosis: Cervicalgia  Muscle weakness (generalized)  PHYSICAL THERAPY DISCHARGE SUMMARY  Visits from Start of Care: 9  Current functional level related to goals / functional outcomes: Improved motion and function   Remaining deficits: Pain at work   Education / Equipment: HEP   Plan: Patient agrees to discharge.  Patient goals were partially met. Patient is being discharged due to being pleased with the current functional level.  ?????       Problem List Patient Active Problem List   Diagnosis Date Noted  . Right hand weakness 09/18/2017  . Plantar fasciitis 10/10/2016  . Foot pain, bilateral 09/09/2016  . Radiculopathy 09/09/2016  . GERD (gastroesophageal reflux disease) 09/09/2014  . Hyperlipidemia 05/24/2013  . Lung nodule seen on imaging study 09/21/2012  . Chronic pain of multiple sites - Knees, Ankles, Back 11/09/2011  . Lupus (systemic lupus erythematosus) ([place] 06/28/2011  .  HYPOTHYROIDISM 04/27/2010  . Depression 04/27/2010  . OSTEOARTHRITIS, KNEE 04/27/2010  . Diabetes mellitus type 2, uncontrolled, with complications ([place]) 72/90/2111  . Obesity 10/26/2006  . HYPERTENSION, BENIGN SYSTEMIC 10/26/2006  . VENOUS INSUFFICIENCY, CHRONIC 10/26/2006    Carney Living 12/29/2017, 9:48 AM  Cooper Render SPT  12/29/2017   During this treatment session, the therapist was present, participating in and directing the treatment.   [place], [place], [address] Phone: [phone]   Fax:  [phone]  Name: Bridget [name] MRN: [id number] Date of Birth: 1960-12-02

## 2018-01-11 MED FILL — PANTOPRAZOLE SOD DR 20 MG T: 20 | 30 days supply | Qty: 30 | Fill #1

## 2018-01-16 MED FILL — BROMOCRIPTINE 2.5 MG TABLET: 2.5 | 30 days supply | Qty: 15 | Fill #9

## 2018-02-08 MED FILL — TRESIBA FLEXTOUCH 100 UNITS: 100 | 25 days supply | Qty: 15 | Fill #8

## 2018-02-08 MED FILL — PANTOPRAZOLE SOD DR 20 MG T: 20 | 30 days supply | Qty: 30 | Fill #2

## 2018-02-08 MED FILL — LUMIGAN 0.01% EYE DROPS: 0.01 | 25 days supply | Qty: 3 | Fill #1

## 2018-02-08 MED FILL — COMBIGAN EYE DROPS: 0.2-0.5 | 50 days supply | Qty: 5 | Fill #0

## 2018-02-08 MED FILL — HUMALOG 100 UNITS/ML KWIKPE: 100 | 90 days supply | Qty: 15 | Fill #2

## 2018-02-19 MED FILL — BROMOCRIPTINE 2.5 MG TABLET: 2.5 | 30 days supply | Qty: 15 | Fill #10

## 2018-03-07 DIAGNOSIS — M1711 Unilateral primary osteoarthritis, right knee: Secondary | ICD-10-CM | POA: Diagnosis not present

## 2018-03-07 DIAGNOSIS — M25561 Pain in right knee: Secondary | ICD-10-CM | POA: Diagnosis not present

## 2018-03-16 MED FILL — TRESIBA FLEXTOUCH 100 UNITS: 100 | 25 days supply | Qty: 15 | Fill #9

## 2018-03-16 MED FILL — UNIFINE PENTIPS 31GX3/16: 31G X 5 MM | 33 days supply | Qty: 100 | Fill #6

## 2018-03-16 MED FILL — PANTOPRAZOLE SOD DR 20 MG T: 20 | 30 days supply | Qty: 30 | Fill #3

## 2018-03-16 MED FILL — UNIFINE PENTIPS 31GX3/16": 31G X 5 MM | 33 days supply | Qty: 100 | Fill #6

## 2018-03-20 DIAGNOSIS — Z6841 Body Mass Index (BMI) 40.0 and over, adult: Secondary | ICD-10-CM | POA: Diagnosis not present

## 2018-03-20 DIAGNOSIS — M542 Cervicalgia: Secondary | ICD-10-CM | POA: Diagnosis not present

## 2018-03-20 DIAGNOSIS — I10 Essential (primary) hypertension: Secondary | ICD-10-CM | POA: Diagnosis not present

## 2018-03-22 ENCOUNTER — Ambulatory Visit: Payer: 59 | Admitting: Endocrinology

## 2018-03-22 ENCOUNTER — Encounter: Payer: Self-pay | Admitting: Endocrinology

## 2018-03-22 VITALS — BP 128/79 | HR 75 | Wt 257.6 lb

## 2018-03-22 DIAGNOSIS — E118 Type 2 diabetes mellitus with unspecified complications: Secondary | ICD-10-CM

## 2018-03-22 DIAGNOSIS — IMO0002 Reserved for concepts with insufficient information to code with codable children: Secondary | ICD-10-CM

## 2018-03-22 DIAGNOSIS — E1165 Type 2 diabetes mellitus with hyperglycemia: Secondary | ICD-10-CM

## 2018-03-22 LAB — POCT GLYCOSYLATED HEMOGLOBIN (HGB A1C): Hemoglobin A1C: 8.3 % — AB (ref 4.0–5.6)

## 2018-03-22 NOTE — Patient Instructions (Addendum)
On this type of insulin schedule, you should eat meals on a regular schedule.  If a meal is missed or significantly delayed, your blood sugar could go low.   Please continue the same medications for diabetes.   Your blood pressure is high today.  Please see your primary care provider soon, to have it rechecked.  check your blood sugar once a day.  vary the time of day when you check, between before the 3 meals, and at bedtime.  also check if you have symptoms of your blood sugar being too high or too low.  please keep a record of the readings and bring it to your next appointment here (or you can bring the meter itself).  You can write it on any piece of paper.  please call us sooner if your blood sugar goes below 70, or if you have a lot of readings over 200.  With the [place], if you miss it, you can still take the full amount 12 hours later.   Please come back for a follow-up appointment in 2 months.

## 2018-03-22 NOTE — Progress Notes (Signed)
Subjective:    Patient ID: Bridget [name], female    DOB: Jan 21, 1961, 57 y.o.   MRN: [id number]  HPI Pt returns for f/u of diabetes mellitus: DM type: Insulin-requiring type 2.  Dx'ed: 6948 Complications: polyneuropathy.   Therapy: insulin + 2 oral meds.   GDM: never.  DKA: never.   Severe hypoglycemia: never.   Pancreatitis: never.  Other: she did not tolerate invokana (vaginitis) or metformin-XR (diarrhea); edema precludes pioglitizone rx.  she took insulin from 2000-2003; she resumed in 2017, She declines multiple daily injections; she works 3rd shift.  Interval history: Pt says she got a steroid injection into the right knee 1 week ago.  She says prior to that, cbg's were approx 100.  After that, cbg's increased to approx 200.  she brings a record of her cbg's which I have reviewed today.  It varies from 80-140.  There is no trend throughout the day.   Past Medical History:  Diagnosis Date  . Bacterial vaginosis 12/07/2010  . Depression   . Diabetes mellitus without complication ([place])   . Diverticulosis   . DIVERTICULOSIS, COLON 10/26/2006  . Fibromyalgia   . GERD (gastroesophageal reflux disease)   . Glaucoma   . Headache   . HEEL SPUR 09/10/2007   Qualifier: Diagnosis of  By: Drue Flirt  MD, Merrily Brittle    . Hypertension   . MALAISE AND FATIGUE 05/27/2010  . Obesity   . OBESITY, NOS 10/26/2006  . Osteoarthritis   . OSTEOARTHRITIS, KNEE 04/27/2010  . Retained tampon 05/21/2012  . SHOULDER PAIN, LEFT 05/19/2010   Qualifier: Diagnosis of  By: Annamary Carolin MD, Amber    . Vaginal yeast infection 12/08/2010    Past Surgical History:  Procedure Laterality Date  . ABDOMINAL HYSTERECTOMY  1990  . ABDOMINAL HYSTERECTOMY    . BLADDER SURGERY  2001  . CATARACT EXTRACTION Right 2001   with implant  . EYE SURGERY     implant rt eye  . ROTATOR CUFF REPAIR Right   . SHOULDER ARTHROSCOPY WITH SUBACROMIAL DECOMPRESSION Left 07/20/2015   Procedure: SHOULDER ARTHROSCOPY WITH DEBRIDEMENT OF  ROTATOR CUFF AND SUBACROMIAL DECOMPRESSION ;  Surgeon: Tania Ade, MD;  Location: [facility];  Service: Orthopedics;  Laterality: Left;  Left shoulder arthroscopy with debridement of rotator cuff and subacromial decompression    Social History   Socioeconomic History  . Marital status: Divorced    Spouse name: Not on file  . Number of children: 2  . Years of education: Not on file  . Highest education level: Not on file  Occupational History  . Occupation: Radio producer: [employer]  . Financial resource strain: Not on file  . Food insecurity:    Worry: Not on file    Inability: Not on file  . Transportation needs:    Medical: Not on file    Non-medical: Not on file  Tobacco Use  . Smoking status: Former Smoker    Packs/day: 0.30    Types: Cigarettes    Last attempt to quit: 08/29/1998    Years since quitting: 19.5  . Smokeless tobacco: Never Used  . Tobacco comment: Quit in 2000   Substance and Sexual Activity  . Alcohol use: Yes    Comment: socially  . Drug use: No  . Sexual activity: Yes    Birth control/protection: Surgical  Lifestyle  . Physical activity:    Days per week: Not on file    Minutes  per session: Not on file  . Stress: Not on file  Relationships  . Social connections:    Talks on phone: Not on file    Gets together: Not on file    Attends religious service: Not on file    Active member of club or organization: Not on file    Attends meetings of clubs or organizations: Not on file    Relationship status: Not on file  . Intimate partner violence:    Fear of current or ex partner: Not on file    Emotionally abused: Not on file    Physically abused: Not on file    Forced sexual activity: Not on file  Other Topics Concern  . Not on file  Social History Narrative   Works at [employer] as a Museum/gallery conservator    Current Outpatient Medications on File Prior to Visit  Medication Sig Dispense Refill  .  ACCU-CHEK GUIDE test strip USE TO CHECK BLOOD SUGAR 2 TIMES PER DAY. 200 each 2  . Alcohol Swabs (ALCOHOL PREPS) PADS 1 Device by Does not apply route 4 (four) times daily -  before meals and at bedtime. 100 each 11  . amitriptyline (ELAVIL) 25 MG tablet Take 0.5 tablets (12.5 mg total) by mouth at bedtime. 30 tablet 2  . aspirin 81 MG tablet Take 1 tablet (81 mg total) by mouth daily.    . benzonatate (TESSALON) 200 MG capsule TAKE 1 CAPSULE (200 MG TOTAL) BY MOUTH TWICE A DAY AS NEEDED FOR COUGH 45 capsule 0  . Blood Glucose Monitoring Suppl (ACCU-CHEK GUIDE) w/Device KIT 2 each by Does not apply route daily. 1 kit 2  . bromocriptine (PARLODEL) 2.5 MG tablet TAKE 1/2 TABLET BY MOUTH ONCE DAILY 15 tablet 11  . cetirizine (ZYRTEC) 10 MG tablet Take 1 tablet (10 mg total) by mouth daily. 30 tablet 11  . colesevelam (WELCHOL) 625 MG tablet Take 2 tablets (1,250 mg total) by mouth 2 (two) times daily with a meal. 60 tablet 11  . Continuous Blood Gluc Sensor (FREESTYLE LIBRE SENSOR SYSTEM) MISC 1 Device by Does not apply route once a week. 12 each 3  . Cranberry-Vitamin C-Probiotic (AZO CRANBERRY PO) Take by mouth daily. Reported on 10/13/2015    . cyclobenzaprine (FLEXERIL) 5 MG tablet Take 1 tablet (5 mg total) by mouth 3 (three) times daily as needed for muscle spasms. Needs visit for refills 90 tablet 0  . diclofenac sodium (VOLTAREN) 1 % GEL Apply 2 g topically 4 (four) times daily. Rub into affected area of foot 2 to 4 times daily 100 g 2  . docusate sodium (COLACE) 100 MG capsule Take 1 capsule (100 mg total) by mouth 3 (three) times daily as needed. 20 capsule 0  . fluticasone (FLONASE) 50 MCG/ACT nasal spray Place 2 sprays into both nostrils daily. 16 g 0  . guaiFENesin (MUCINEX) 600 MG 12 hr tablet Take 1 tablet (600 mg total) by mouth 2 (two) times daily as needed for cough or to loosen phlegm. 14 tablet 0  . insulin degludec (TRESIBA FLEXTOUCH) 100 UNIT/ML SOPN FlexTouch Pen Inject 0.6 mLs (60  Units total) into the skin daily. And pen needles 1/day 10 pen 11  . insulin lispro (HUMALOG KWIKPEN) 100 UNIT/ML KiwkPen Inject 0.15 mLs (15 Units total) into the skin daily. With the biggest meal, and pen needles 2/day 15 mL 11  . Insulin Pen Needle 31G X 5 MM MISC Used for insulin injections 3x daily. 100 each 8  .  Lancets (ACCU-CHEK MULTICLIX) lancets Use to check blood sugar 2 times per day. Dx code E11.9 200 each 2  . lisinopril (PRINIVIL,ZESTRIL) 5 MG tablet TAKE 1 TABLET BY MOUTH DAILY. 30 tablet 5  . NALTREXONE HCL PO Take 3 mg by mouth daily.    . NONFORMULARY OR COMPOUNDED ITEM Take 1 tablet by mouth at bedtime. Reported on 03/08/2016    . NONFORMULARY OR COMPOUNDED ITEM [facility]: Achillies Tendonitis Cream - Diclofenac 3%, Baclofen 2%, Bupivacaine 1%, Gabapentin 6%, Ibuprofen 3%, Pentoxifylline 3%, apply 1-2 gram to feet 3-4 times daily. 120 each 0  . ondansetron (ZOFRAN ODT) 8 MG disintegrating tablet Take 1 tablet (8 mg total) by mouth every 8 (eight) hours as needed for nausea or vomiting. 20 tablet 0  . OVER THE COUNTER MEDICATION 1 capsule daily.     Marland Kitchen oxymetazoline (AFRIN NASAL SPRAY) 0.05 % nasal spray Place 1 spray into both nostrils 2 (two) times daily. Use only for 3days, then stop 30 mL 0  . pantoprazole (PROTONIX) 20 MG tablet TAKE 1 TABLET BY MOUTH DAILY. 30 tablet PRN  . pregabalin (LYRICA) 150 MG capsule Take 1 capsule (150 mg total) by mouth 2 (two) times daily. Needs visit for more refill 60 capsule 5  . pregabalin (LYRICA) 50 MG capsule Take 1 capsule (50 mg total) by mouth 2 (two) times daily. 60 capsule 0  . sodium chloride (OCEAN) 0.65 % SOLN nasal spray Place 1 spray into both nostrils as needed for congestion. 15 mL 0  . traMADol (ULTRAM) 50 MG tablet Take 1 tablet (50 mg total) by mouth every 6 (six) hours as needed. 15 tablet 0  . triamcinolone (NASACORT AQ) 55 MCG/ACT AERO nasal inhaler Place 2 sprays into the nose daily. 1 Inhaler 12  . TRUEPLUS LANCETS  28G MISC Use to help check blood sugars twice a day Dx E11.9 100 each 3  . TRUETEST TEST test strip USE AS INSTRUCTED 100 each 11   No current facility-administered medications on file prior to visit.     Allergies  Allergen Reactions  . Citrus Itching  . Invokana [Canagliflozin] Other (See Comments)    Caused frequent vaginal yeast infections  . Codeine Hives  . Metformin And Related Diarrhea and Nausea And Vomiting  . Metoclopramide Hcl Nausea And Vomiting  . Penicillins Nausea And Vomiting  . Propoxyphene N-Acetaminophen Nausea And Vomiting  . Sulfonamide Derivatives Hives    Family History  Problem Relation Age of Onset  . Breast cancer Mother   . Diabetes Mother   . Ovarian cancer Maternal Grandmother   . Heart disease Maternal Grandmother        great  . Stomach cancer Cousin   . Diabetes Brother        x 3    BP 128/79 (BP Location: Left Arm, Patient Position: Sitting, Cuff Size: Normal)   Pulse 75   Wt 257 lb 9.6 oz (116.8 kg)   SpO2 95%   BMI 42.87 kg/m   Review of Systems She denies hypoglycemia.      Objective:   Physical Exam VITAL SIGNS:  See vs page GENERAL: no distress Pulses: dorsalis pedis intact bilat.   MSK: no deformity of the feet CV: no leg edema Skin:  no ulcer on the feet.  normal color and temp on the feet. Neuro: sensation is intact to touch on the feet  Lab Results  Component Value Date   HGBA1C 8.3 (A) 03/22/2018  Assessment & Plan:  Insulin-requiring type 2 DM, with polyneuropathy. Knee pain: new.  Steroid shot is affecting a1c, so we won't increase insulin today. HTN: is noted today.   Patient Instructions  On this type of insulin schedule, you should eat meals on a regular schedule.  If a meal is missed or significantly delayed, your blood sugar could go low.   Please continue the same medications for diabetes.   Your blood pressure is high today.  Please see your primary care provider soon, to have it rechecked.    check your blood sugar once a day.  vary the time of day when you check, between before the 3 meals, and at bedtime.  also check if you have symptoms of your blood sugar being too high or too low.  please keep a record of the readings and bring it to your next appointment here (or you can bring the meter itself).  You can write it on any piece of paper.  please call us sooner if your blood sugar goes below 70, or if you have a lot of readings over 200.  With the [place], if you miss it, you can still take the full amount 12 hours later.   Please come back for a follow-up appointment in 2 months.

## 2018-04-04 ENCOUNTER — Other Ambulatory Visit: Payer: Self-pay | Admitting: Internal Medicine

## 2018-04-04 ENCOUNTER — Other Ambulatory Visit: Payer: Self-pay | Admitting: Endocrinology

## 2018-04-05 MED FILL — LISINOPRIL 5 MG TABLET: 5 | 90 days supply | Qty: 90 | Fill #0

## 2018-04-05 MED FILL — PREGABALIN 150 MG CAPS: 150 | 30 days supply | Qty: 60 | Fill #0

## 2018-04-05 MED FILL — BROMOCRIPTINE 2.5 MG TABLET: 2.5 | 30 days supply | Qty: 15 | Fill #0

## 2018-04-05 NOTE — Telephone Encounter (Signed)
Control database checked last refill: 12/14/2017  LOV:  09/18/2017

## 2018-04-11 MED FILL — TRESIBA FLEXTOUCH 100 UNITS: 100 | 25 days supply | Qty: 15 | Fill #10

## 2018-04-14 DIAGNOSIS — M19011 Primary osteoarthritis, right shoulder: Secondary | ICD-10-CM | POA: Diagnosis not present

## 2018-04-27 MED FILL — PANTOPRAZOLE SOD DR 20 MG T: 20 | 30 days supply | Qty: 30 | Fill #4

## 2018-04-27 MED FILL — UNIFINE PENTIPS 31GX3/16": 31G X 5 MM | 33 days supply | Qty: 100 | Fill #7

## 2018-04-27 MED FILL — UNIFINE PENTIPS 31GX3/16: 31G X 5 MM | 33 days supply | Qty: 100 | Fill #7

## 2018-05-11 MED FILL — MELOXICAM 15 MG TABLET: 15 | 30 days supply | Qty: 30 | Fill #0

## 2018-05-11 MED FILL — BROMOCRIPTINE 2.5 MG TABLET: 2.5 | 30 days supply | Qty: 15 | Fill #1

## 2018-05-11 MED FILL — traMADol HCL 50 MG TABS: 50 | 30 days supply | Qty: 60 | Fill #0

## 2018-05-21 ENCOUNTER — Other Ambulatory Visit: Payer: Self-pay | Admitting: Endocrinology

## 2018-05-21 MED FILL — TRESIBA FLEXTOUCH 100 UNITS: 100 | 25 days supply | Qty: 15 | Fill #0

## 2018-05-21 MED FILL — LUMIGAN 0.01% EYE DROPS: 0.01 | 25 days supply | Qty: 3 | Fill #2

## 2018-05-29 ENCOUNTER — Encounter: Payer: Self-pay | Admitting: Endocrinology

## 2018-05-29 ENCOUNTER — Ambulatory Visit (INDEPENDENT_AMBULATORY_CARE_PROVIDER_SITE_OTHER): Payer: 59 | Admitting: Endocrinology

## 2018-05-29 VITALS — BP 126/74 | HR 87 | Ht 65.0 in | Wt 261.8 lb

## 2018-05-29 DIAGNOSIS — E1165 Type 2 diabetes mellitus with hyperglycemia: Secondary | ICD-10-CM | POA: Diagnosis not present

## 2018-05-29 DIAGNOSIS — IMO0002 Reserved for concepts with insufficient information to code with codable children: Secondary | ICD-10-CM

## 2018-05-29 DIAGNOSIS — E118 Type 2 diabetes mellitus with unspecified complications: Secondary | ICD-10-CM

## 2018-05-29 LAB — POCT GLYCOSYLATED HEMOGLOBIN (HGB A1C): Hemoglobin A1C: 8.5 % — AB (ref 4.0–5.6)

## 2018-05-29 MED ORDER — INSULIN DEGLUDEC 100 UNIT/ML ~~LOC~~ SOPN: 65.0000 [IU] | PEN_INJECTOR | Freq: Every day | SUBCUTANEOUS | 3 refills | Status: DC

## 2018-05-29 NOTE — Patient Instructions (Addendum)
On this type of insulin schedule, you should eat meals on a regular schedule.  If a meal is missed or significantly delayed, your blood sugar could go low.   Please increase the insulin to 65 units daily  check your blood sugar once a day.  vary the time of day when you check, between before the 3 meals, and at bedtime.  also check if you have symptoms of your blood sugar being too high or too low.  please keep a record of the readings and bring it to your next appointment here (or you can bring the meter itself).  You can write it on any piece of paper.  please call us sooner if your blood sugar goes below 70, or if you have a lot of readings over 200.  With the [place], if you miss it, you can still take the full amount 12 hours later.   Please come back for a follow-up appointment in 2 months.

## 2018-05-29 NOTE — Progress Notes (Signed)
Subjective:    Patient ID: Bridget [name], female    DOB: 1961/05/26, 57 y.o.   MRN: [id number]  HPI Pt returns for f/u of diabetes mellitus: DM type: Insulin-requiring type 2.  Dx'ed: 9093 Complications: polyneuropathy.   Therapy: insulin + 2 oral meds.   GDM: never.  DKA: never.   Severe hypoglycemia: never.   Pancreatitis: never.  Other: she did not tolerate invokana (vaginitis) or metformin-XR (diarrhea); edema precludes pioglitizone rx.  she also took insulin from 2000-2003; she resumed in 2017, She declines multiple daily injections; she works 3rd shift.  Interval history:  no recent steriods.  she brings a record of her cbg's which I have reviewed today.  It varies from 73-200's.  There is no trend throughout the day.   Past Medical History:  Diagnosis Date  . Bacterial vaginosis 12/07/2010  . Depression   . Diabetes mellitus without complication ([place])   . Diverticulosis   . DIVERTICULOSIS, COLON 10/26/2006  . Fibromyalgia   . GERD (gastroesophageal reflux disease)   . Glaucoma   . Headache   . HEEL SPUR 09/10/2007   Qualifier: Diagnosis of  By: Drue Flirt  MD, Merrily Brittle    . Hypertension   . MALAISE AND FATIGUE 05/27/2010  . Obesity   . OBESITY, NOS 10/26/2006  . Osteoarthritis   . OSTEOARTHRITIS, KNEE 04/27/2010  . Retained tampon 05/21/2012  . SHOULDER PAIN, LEFT 05/19/2010   Qualifier: Diagnosis of  By: Annamary Carolin MD, Amber    . Vaginal yeast infection 12/08/2010    Past Surgical History:  Procedure Laterality Date  . ABDOMINAL HYSTERECTOMY  1990  . ABDOMINAL HYSTERECTOMY    . BLADDER SURGERY  2001  . CATARACT EXTRACTION Right 2001   with implant  . EYE SURGERY     implant rt eye  . ROTATOR CUFF REPAIR Right   . SHOULDER ARTHROSCOPY WITH SUBACROMIAL DECOMPRESSION Left 07/20/2015   Procedure: SHOULDER ARTHROSCOPY WITH DEBRIDEMENT OF ROTATOR CUFF AND SUBACROMIAL DECOMPRESSION ;  Surgeon: Tania Ade, MD;  Location: [facility];  Service:  Orthopedics;  Laterality: Left;  Left shoulder arthroscopy with debridement of rotator cuff and subacromial decompression    Social History   Socioeconomic History  . Marital status: Divorced    Spouse name: Not on file  . Number of children: 2  . Years of education: Not on file  . Highest education level: Not on file  Occupational History  . Occupation: Radio producer: [employer]  . Financial resource strain: Not on file  . Food insecurity:    Worry: Not on file    Inability: Not on file  . Transportation needs:    Medical: Not on file    Non-medical: Not on file  Tobacco Use  . Smoking status: Former Smoker    Packs/day: 0.30    Types: Cigarettes    Last attempt to quit: 08/29/1998    Years since quitting: 19.7  . Smokeless tobacco: Never Used  . Tobacco comment: Quit in 2000   Substance and Sexual Activity  . Alcohol use: Yes    Comment: socially  . Drug use: No  . Sexual activity: Yes    Birth control/protection: Surgical  Lifestyle  . Physical activity:    Days per week: Not on file    Minutes per session: Not on file  . Stress: Not on file  Relationships  . Social connections:    Talks on phone: Not on file  Gets together: Not on file    Attends religious service: Not on file    Active member of club or organization: Not on file    Attends meetings of clubs or organizations: Not on file    Relationship status: Not on file  . Intimate partner violence:    Fear of current or ex partner: Not on file    Emotionally abused: Not on file    Physically abused: Not on file    Forced sexual activity: Not on file  Other Topics Concern  . Not on file  Social History Narrative   Works at [employer] as a Museum/gallery conservator    Current Outpatient Medications on File Prior to Visit  Medication Sig Dispense Refill  . ACCU-CHEK GUIDE test strip USE TO CHECK BLOOD SUGAR 2 TIMES PER DAY. 200 each 2  . Alcohol Swabs (ALCOHOL PREPS) PADS 1 Device by  Does not apply route 4 (four) times daily -  before meals and at bedtime. 100 each 11  . amitriptyline (ELAVIL) 25 MG tablet Take 0.5 tablets (12.5 mg total) by mouth at bedtime. 30 tablet 2  . aspirin 81 MG tablet Take 1 tablet (81 mg total) by mouth daily.    . benzonatate (TESSALON) 200 MG capsule TAKE 1 CAPSULE (200 MG TOTAL) BY MOUTH TWICE A DAY AS NEEDED FOR COUGH 45 capsule 0  . Blood Glucose Monitoring Suppl (ACCU-CHEK GUIDE) w/Device KIT 2 each by Does not apply route daily. 1 kit 2  . bromocriptine (PARLODEL) 2.5 MG tablet TAKE 1/2 TABLET BY MOUTH ONCE DAILY 15 tablet 11  . cetirizine (ZYRTEC) 10 MG tablet Take 1 tablet (10 mg total) by mouth daily. 30 tablet 11  . colesevelam (WELCHOL) 625 MG tablet Take 2 tablets (1,250 mg total) by mouth 2 (two) times daily with a meal. 60 tablet 11  . Continuous Blood Gluc Sensor (FREESTYLE LIBRE SENSOR SYSTEM) MISC 1 Device by Does not apply route once a week. 12 each 3  . Cranberry-Vitamin C-Probiotic (AZO CRANBERRY PO) Take by mouth daily. Reported on 10/13/2015    . cyclobenzaprine (FLEXERIL) 5 MG tablet Take 1 tablet (5 mg total) by mouth 3 (three) times daily as needed for muscle spasms. Needs visit for refills 90 tablet 0  . diclofenac sodium (VOLTAREN) 1 % GEL Apply 2 g topically 4 (four) times daily. Rub into affected area of foot 2 to 4 times daily 100 g 2  . docusate sodium (COLACE) 100 MG capsule Take 1 capsule (100 mg total) by mouth 3 (three) times daily as needed. 20 capsule 0  . fluticasone (FLONASE) 50 MCG/ACT nasal spray Place 2 sprays into both nostrils daily. 16 g 0  . guaiFENesin (MUCINEX) 600 MG 12 hr tablet Take 1 tablet (600 mg total) by mouth 2 (two) times daily as needed for cough or to loosen phlegm. 14 tablet 0  . insulin lispro (HUMALOG KWIKPEN) 100 UNIT/ML KiwkPen Inject 0.15 mLs (15 Units total) into the skin daily. With the biggest meal, and pen needles 2/day 15 mL 11  . Insulin Pen Needle 31G X 5 MM MISC Used for insulin  injections 3x daily. 100 each 8  . Lancets (ACCU-CHEK MULTICLIX) lancets Use to check blood sugar 2 times per day. Dx code E11.9 200 each 2  . lisinopril (PRINIVIL,ZESTRIL) 5 MG tablet Take 1 tablet (5 mg total) by mouth daily. 90 tablet 1  . LYRICA 150 MG capsule TAKE 1 CAPSULE BY MOUTH TWICE DAILY NEEDS VISIT FOR MORE  REFILL 60 capsule 2  . NALTREXONE HCL PO Take 3 mg by mouth daily.    . NONFORMULARY OR COMPOUNDED ITEM Take 1 tablet by mouth at bedtime. Reported on 03/08/2016    . NONFORMULARY OR COMPOUNDED ITEM [facility]: Achillies Tendonitis Cream - Diclofenac 3%, Baclofen 2%, Bupivacaine 1%, Gabapentin 6%, Ibuprofen 3%, Pentoxifylline 3%, apply 1-2 gram to feet 3-4 times daily. 120 each 0  . ondansetron (ZOFRAN ODT) 8 MG disintegrating tablet Take 1 tablet (8 mg total) by mouth every 8 (eight) hours as needed for nausea or vomiting. 20 tablet 0  . OVER THE COUNTER MEDICATION 1 capsule daily.     Marland Kitchen oxymetazoline (AFRIN NASAL SPRAY) 0.05 % nasal spray Place 1 spray into both nostrils 2 (two) times daily. Use only for 3days, then stop 30 mL 0  . pantoprazole (PROTONIX) 20 MG tablet TAKE 1 TABLET BY MOUTH DAILY. 30 tablet PRN  . pregabalin (LYRICA) 50 MG capsule Take 1 capsule (50 mg total) by mouth 2 (two) times daily. 60 capsule 0  . sodium chloride (OCEAN) 0.65 % SOLN nasal spray Place 1 spray into both nostrils as needed for congestion. 15 mL 0  . traMADol (ULTRAM) 50 MG tablet Take 1 tablet (50 mg total) by mouth every 6 (six) hours as needed. 15 tablet 0  . triamcinolone (NASACORT AQ) 55 MCG/ACT AERO nasal inhaler Place 2 sprays into the nose daily. 1 Inhaler 12  . TRUEPLUS LANCETS 28G MISC Use to help check blood sugars twice a day Dx E11.9 100 each 3  . TRUETEST TEST test strip USE AS INSTRUCTED 100 each 11   No current facility-administered medications on file prior to visit.     Allergies  Allergen Reactions  . Sulfa Antibiotics Hives and Rash  . Citrus Itching  .  Invokana [Canagliflozin] Other (See Comments)    Caused frequent vaginal yeast infections  . Codeine Hives  . Metformin And Related Diarrhea and Nausea And Vomiting  . Metoclopramide Hcl Nausea And Vomiting  . Penicillins Nausea And Vomiting  . Propoxyphene Nausea And Vomiting    GI upset  . Propoxyphene N-Acetaminophen Nausea And Vomiting  . Sulfonamide Derivatives Hives    Family History  Problem Relation Age of Onset  . Breast cancer Mother   . Diabetes Mother   . Ovarian cancer Maternal Grandmother   . Heart disease Maternal Grandmother        great  . Stomach cancer Cousin   . Diabetes Brother        x 3    BP 126/74 (BP Location: Left Arm)   Pulse 87   Ht _0  (1.651 m)   Wt 261 lb 12.8 oz (118.8 kg)   SpO2 95%   BMI 43.57 kg/m    Review of Systems She denies hypoglycemia    Objective:   Physical Exam VITAL SIGNS:  See vs page GENERAL: no distress Pulses: foot pulses are intact bilaterally.   MSK: no deformity of the feet or ankles.  CV: trace bilat edema of the legs.   Skin:  no ulcer on the feet or ankles.  normal color and temp on the feet and ankles Neuro: sensation is intact to touch on the feet and ankles.    Lab Results  Component Value Date   HGBA1C 8.5 (A) 05/29/2018       Assessment & Plan:  Insulin-requiring type 2 DM, with polyneuropathy: worse.    Occupational status (3rd shift): this complicates the interpretation of  cb[facility] record  Patient Instructions  On this type of insulin schedule, you should eat meals on a regular schedule.  If a meal is missed or significantly delayed, your blood sugar could go low.   Please increase the insulin to 65 units daily  check your blood sugar once a day.  vary the time of day when you check, between before the 3 meals, and at bedtime.  also check if you have symptoms of your blood sugar being too high or too low.  please keep a record of the readings and bring it to your next appointment here (or you can  bring the meter itself).  You can write it on any piece of paper.  please call us sooner if your blood sugar goes below 70, or if you have a lot of readings over 200.  With the [place], if you miss it, you can still take the full amount 12 hours later.   Please come back for a follow-up appointment in 2 months.

## 2018-06-01 DIAGNOSIS — H401121 Primary open-angle glaucoma, left eye, mild stage: Secondary | ICD-10-CM | POA: Diagnosis not present

## 2018-06-01 DIAGNOSIS — H401112 Primary open-angle glaucoma, right eye, moderate stage: Secondary | ICD-10-CM | POA: Diagnosis not present

## 2018-06-05 DIAGNOSIS — M1711 Unilateral primary osteoarthritis, right knee: Secondary | ICD-10-CM | POA: Diagnosis not present

## 2018-06-08 MED FILL — PANTOPRAZOLE SOD DR 20 MG T: 20 | 30 days supply | Qty: 30 | Fill #5

## 2018-06-15 MED FILL — BROMOCRIPTINE 2.5 MG TABLET: 2.5 | 30 days supply | Qty: 15 | Fill #2

## 2018-06-15 MED FILL — PREGABALIN 150 MG CAPS: 150 | 30 days supply | Qty: 60 | Fill #1

## 2018-06-15 MED FILL — TRESIBA FLEXTOUCH 100 UNITS: 100 | 27 days supply | Qty: 18 | Fill #0

## 2018-06-25 MED FILL — LATANOPROST 0.005% EYE DRP: 0.005 | 25 days supply | Qty: 3 | Fill #0

## 2018-06-27 ENCOUNTER — Other Ambulatory Visit: Payer: Self-pay | Admitting: Endocrinology

## 2018-06-27 MED FILL — HUMALOG 100 UNITS/ML KWIKPE: 100 | 90 days supply | Qty: 15 | Fill #0

## 2018-07-13 MED FILL — MELOXICAM 15 MG TABLET: 15 | 30 days supply | Qty: 30 | Fill #1

## 2018-07-13 MED FILL — PANTOPRAZOLE SOD DR 20 MG T: 20 | 30 days supply | Qty: 30 | Fill #6

## 2018-07-24 MED FILL — ROCKLATAN 0.02-0.005 % SOLN: 0.02-0.005 | 25 days supply | Qty: 3 | Fill #0

## 2018-07-24 MED FILL — TRESIBA FLEXTOUCH 100 UNITS: 100 | 27 days supply | Qty: 18 | Fill #1

## 2018-07-24 MED FILL — BROMOCRIPTINE 2.5 MG TABLET: 2.5 | 30 days supply | Qty: 15 | Fill #3

## 2018-07-31 ENCOUNTER — Ambulatory Visit (INDEPENDENT_AMBULATORY_CARE_PROVIDER_SITE_OTHER): Payer: 59 | Admitting: Endocrinology

## 2018-07-31 ENCOUNTER — Encounter: Payer: Self-pay | Admitting: Endocrinology

## 2018-07-31 VITALS — BP 138/70 | HR 93 | Ht 65.0 in | Wt 262.8 lb

## 2018-07-31 DIAGNOSIS — IMO0002 Reserved for concepts with insufficient information to code with codable children: Secondary | ICD-10-CM

## 2018-07-31 DIAGNOSIS — E1165 Type 2 diabetes mellitus with hyperglycemia: Secondary | ICD-10-CM

## 2018-07-31 DIAGNOSIS — E118 Type 2 diabetes mellitus with unspecified complications: Secondary | ICD-10-CM

## 2018-07-31 LAB — POCT GLYCOSYLATED HEMOGLOBIN (HGB A1C): HEMOGLOBIN A1C: 8.3 % — AB (ref 4.0–5.6)

## 2018-07-31 MED ORDER — INSULIN DEGLUDEC 100 UNIT/ML ~~LOC~~ SOPN: 70.0000 [IU] | PEN_INJECTOR | Freq: Every day | SUBCUTANEOUS | 3 refills | Status: DC

## 2018-07-31 NOTE — Patient Instructions (Addendum)
On this type of insulin schedule, you should eat meals on a regular schedule.  If a meal is missed or significantly delayed, your blood sugar could go low.   Please increase the insulin to 70 units daily.   check your blood sugar once a day.  vary the time of day when you check, between before the 3 meals, and at bedtime.  also check if you have symptoms of your blood sugar being too high or too low.  please keep a record of the readings and bring it to your next appointment here (or you can bring the meter itself).  You can write it on any piece of paper.  please call us sooner if your blood sugar goes below 70, or if you have a lot of readings over 200.  With the [place], if you miss it, you can still take the full amount 12 hours later.   Please come back for a follow-up appointment in 2 months.

## 2018-07-31 NOTE — Progress Notes (Signed)
Subjective:    Patient ID: Bridget [name], female    DOB: September 10, 1960, 57 y.o.   MRN: [id number]  HPI Pt returns for f/u of diabetes mellitus: DM type: Insulin-requiring type 2.  Dx'ed: 8768 Complications: polyneuropathy.   Therapy: insulin + 2 oral meds.   GDM: never.  DKA: never.   Severe hypoglycemia: never.   Pancreatitis: never.  Other: she did not tolerate invokana (vaginitis) or metformin-XR (diarrhea); edema precludes pioglitizone rx.  she also took insulin from 2000-2003; she resumed in 2017, She declines multiple daily injections; she works 3rd shift.  Interval history:  no recent steriods.  Meter is downloaded today, and the printout is scanned into the record.  cbg varies from 87-270.  It is most variable at HS.  Pt says she never misses the insulin.   Past Medical History:  Diagnosis Date  . Bacterial vaginosis 12/07/2010  . Depression   . Diabetes mellitus without complication ([place])   . Diverticulosis   . DIVERTICULOSIS, COLON 10/26/2006  . Fibromyalgia   . GERD (gastroesophageal reflux disease)   . Glaucoma   . Headache   . HEEL SPUR 09/10/2007   Qualifier: Diagnosis of  By: Drue Flirt  MD, Merrily Brittle    . Hypertension   . MALAISE AND FATIGUE 05/27/2010  . Obesity   . OBESITY, NOS 10/26/2006  . Osteoarthritis   . OSTEOARTHRITIS, KNEE 04/27/2010  . Retained tampon 05/21/2012  . SHOULDER PAIN, LEFT 05/19/2010   Qualifier: Diagnosis of  By: Annamary Carolin MD, Amber    . Vaginal yeast infection 12/08/2010    Past Surgical History:  Procedure Laterality Date  . ABDOMINAL HYSTERECTOMY  1990  . ABDOMINAL HYSTERECTOMY    . BLADDER SURGERY  2001  . CATARACT EXTRACTION Right 2001   with implant  . EYE SURGERY     implant rt eye  . ROTATOR CUFF REPAIR Right   . SHOULDER ARTHROSCOPY WITH SUBACROMIAL DECOMPRESSION Left 07/20/2015   Procedure: SHOULDER ARTHROSCOPY WITH DEBRIDEMENT OF ROTATOR CUFF AND SUBACROMIAL DECOMPRESSION ;  Surgeon: Tania Ade, MD;  Location: [place];  Service: Orthopedics;  Laterality: Left;  Left shoulder arthroscopy with debridement of rotator cuff and subacromial decompression    Social History   Socioeconomic History  . Marital status: Divorced    Spouse name: Not on file  . Number of children: 2  . Years of education: Not on file  . Highest education level: Not on file  Occupational History  . Occupation: Radio producer: [employer]  . Financial resource strain: Not on file  . Food insecurity:    Worry: Not on file    Inability: Not on file  . Transportation needs:    Medical: Not on file    Non-medical: Not on file  Tobacco Use  . Smoking status: Former Smoker    Packs/day: 0.30    Types: Cigarettes    Last attempt to quit: 08/29/1998    Years since quitting: 19.9  . Smokeless tobacco: Never Used  . Tobacco comment: Quit in 2000   Substance and Sexual Activity  . Alcohol use: Yes    Comment: socially  . Drug use: No  . Sexual activity: Yes    Birth control/protection: Surgical  Lifestyle  . Physical activity:    Days per week: Not on file    Minutes per session: Not on file  . Stress: Not on file  Relationships  . Social connections:  Talks on phone: Not on file    Gets together: Not on file    Attends religious service: Not on file    Active member of club or organization: Not on file    Attends meetings of clubs or organizations: Not on file    Relationship status: Not on file  . Intimate partner violence:    Fear of current or ex partner: Not on file    Emotionally abused: Not on file    Physically abused: Not on file    Forced sexual activity: Not on file  Other Topics Concern  . Not on file  Social History Narrative   Works at [employer] as a Museum/gallery conservator    Current Outpatient Medications on File Prior to Visit  Medication Sig Dispense Refill  . ACCU-CHEK GUIDE test strip USE TO CHECK BLOOD SUGAR 2 TIMES PER DAY. 200 each 2  . Alcohol Swabs  (ALCOHOL PREPS) PADS 1 Device by Does not apply route 4 (four) times daily -  before meals and at bedtime. 100 each 11  . amitriptyline (ELAVIL) 25 MG tablet Take 0.5 tablets (12.5 mg total) by mouth at bedtime. 30 tablet 2  . aspirin 81 MG tablet Take 1 tablet (81 mg total) by mouth daily.    . benzonatate (TESSALON) 200 MG capsule TAKE 1 CAPSULE (200 MG TOTAL) BY MOUTH TWICE A DAY AS NEEDED FOR COUGH 45 capsule 0  . Blood Glucose Monitoring Suppl (ACCU-CHEK GUIDE) w/Device KIT 2 each by Does not apply route daily. 1 kit 2  . bromocriptine (PARLODEL) 2.5 MG tablet TAKE 1/2 TABLET BY MOUTH ONCE DAILY 15 tablet 11  . cetirizine (ZYRTEC) 10 MG tablet Take 1 tablet (10 mg total) by mouth daily. 30 tablet 11  . colesevelam (WELCHOL) 625 MG tablet Take 2 tablets (1,250 mg total) by mouth 2 (two) times daily with a meal. 60 tablet 11  . Continuous Blood Gluc Sensor (FREESTYLE LIBRE SENSOR SYSTEM) MISC 1 Device by Does not apply route once a week. 12 each 3  . Cranberry-Vitamin C-Probiotic (AZO CRANBERRY PO) Take by mouth daily. Reported on 10/13/2015    . cyclobenzaprine (FLEXERIL) 5 MG tablet Take 1 tablet (5 mg total) by mouth 3 (three) times daily as needed for muscle spasms. Needs visit for refills 90 tablet 0  . diclofenac sodium (VOLTAREN) 1 % GEL Apply 2 g topically 4 (four) times daily. Rub into affected area of foot 2 to 4 times daily 100 g 2  . docusate sodium (COLACE) 100 MG capsule Take 1 capsule (100 mg total) by mouth 3 (three) times daily as needed. 20 capsule 0  . fluticasone (FLONASE) 50 MCG/ACT nasal spray Place 2 sprays into both nostrils daily. 16 g 0  . guaiFENesin (MUCINEX) 600 MG 12 hr tablet Take 1 tablet (600 mg total) by mouth 2 (two) times daily as needed for cough or to loosen phlegm. 14 tablet 0  . HUMALOG KWIKPEN 100 UNIT/ML KiwkPen INJECT 15 UNITS INTO THE SKIN DAILY WITH THE BIGGEST MEAL. 15 mL 11  . Insulin Pen Needle 31G X 5 MM MISC Used for insulin injections 3x daily. 100  each 8  . Lancets (ACCU-CHEK MULTICLIX) lancets Use to check blood sugar 2 times per day. Dx code E11.9 200 each 2  . lisinopril (PRINIVIL,ZESTRIL) 5 MG tablet Take 1 tablet (5 mg total) by mouth daily. 90 tablet 1  . LYRICA 150 MG capsule TAKE 1 CAPSULE BY MOUTH TWICE DAILY NEEDS VISIT FOR MORE  REFILL 60 capsule 2  . NALTREXONE HCL PO Take 3 mg by mouth daily.    . NONFORMULARY OR COMPOUNDED ITEM Take 1 tablet by mouth at bedtime. Reported on 03/08/2016    . NONFORMULARY OR COMPOUNDED ITEM [facility]: [place]ies Tendonitis Cream - Diclofenac 3%, Baclofen 2%, Bupivacaine 1%, Gabapentin 6%, Ibuprofen 3%, Pentoxifylline 3%, apply 1-2 gram to feet 3-4 times daily. 120 each 0  . ondansetron (ZOFRAN ODT) 8 MG disintegrating tablet Take 1 tablet (8 mg total) by mouth every 8 (eight) hours as needed for nausea or vomiting. 20 tablet 0  . OVER THE COUNTER MEDICATION 1 capsule daily.     Marland Kitchen oxymetazoline (AFRIN NASAL SPRAY) 0.05 % nasal spray Place 1 spray into both nostrils 2 (two) times daily. Use only for 3days, then stop 30 mL 0  . pantoprazole (PROTONIX) 20 MG tablet TAKE 1 TABLET BY MOUTH DAILY. 30 tablet PRN  . pregabalin (LYRICA) 50 MG capsule Take 1 capsule (50 mg total) by mouth 2 (two) times daily. 60 capsule 0  . sodium chloride (OCEAN) 0.65 % SOLN nasal spray Place 1 spray into both nostrils as needed for congestion. 15 mL 0  . traMADol (ULTRAM) 50 MG tablet Take 1 tablet (50 mg total) by mouth every 6 (six) hours as needed. 15 tablet 0  . triamcinolone (NASACORT AQ) 55 MCG/ACT AERO nasal inhaler Place 2 sprays into the nose daily. 1 Inhaler 12  . TRUEPLUS LANCETS 28G MISC Use to help check blood sugars twice a day Dx E11.9 100 each 3  . TRUETEST TEST test strip USE AS INSTRUCTED 100 each 11   No current facility-administered medications on file prior to visit.     Allergies  Allergen Reactions  . Sulfa Antibiotics Hives and Rash  . Citrus Itching  . Invokana [Canagliflozin] Other  (See Comments)    Caused frequent vaginal yeast infections  . Codeine Hives  . Metformin And Related Diarrhea and Nausea And Vomiting  . Metoclopramide Hcl Nausea And Vomiting  . Penicillins Nausea And Vomiting  . Propoxyphene Nausea And Vomiting    GI upset  . Propoxyphene N-Acetaminophen Nausea And Vomiting  . Sulfonamide Derivatives Hives    Family History  Problem Relation Age of Onset  . Breast cancer Mother   . Diabetes Mother   . Ovarian cancer Maternal Grandmother   . Heart disease Maternal Grandmother        great  . Stomach cancer Cousin   . Diabetes Brother        x 3    BP 138/70 (BP Location: Right Arm, Patient Position: Sitting, Cuff Size: Normal)   Pulse 93   Ht _0  (1.651 m)   Wt 262 lb 12.8 oz (119.2 kg)   SpO2 97%   BMI 43.73 kg/m    Review of Systems She denies hypoglycemia    Objective:   Physical Exam VITAL SIGNS:  See vs page GENERAL: no distress Pulses: dorsalis pedis intact bilat.   MSK: no deformity of the feet CV: trace bilat leg edema Skin:  no ulcer on the feet.  normal color and temp on the feet. Neuro: sensation is intact to touch on the feet  A1c=8.3%     Assessment & Plan:  Insulin-requiring type 2 DM, with polyneuropathy: she needs increased rx.   Patient Instructions  On this type of insulin schedule, you should eat meals on a regular schedule.  If a meal is missed or significantly delayed, your blood sugar could  go low.   Please increase the insulin to 70 units daily.   check your blood sugar once a day.  vary the time of day when you check, between before the 3 meals, and at bedtime.  also check if you have symptoms of your blood sugar being too high or too low.  please keep a record of the readings and bring it to your next appointment here (or you can bring the meter itself).  You can write it on any piece of paper.  please call us sooner if your blood sugar goes below 70, or if you have a lot of readings over 200.  With  the [place], if you miss it, you can still take the full amount 12 hours later.   Please come back for a follow-up appointment in 2 months.

## 2018-08-17 ENCOUNTER — Other Ambulatory Visit: Payer: Self-pay | Admitting: Endocrinology

## 2018-08-17 MED FILL — UNIFINE PENTIPS 31GX3/16": 31G X 5 MM | 30 days supply | Qty: 100 | Fill #0

## 2018-08-17 MED FILL — UNIFINE PENTIPS 31GX3/16: 31G X 5 MM | 30 days supply | Qty: 100 | Fill #0

## 2018-08-21 MED FILL — PANTOPRAZOLE SOD DR 20 MG T: 20 | 30 days supply | Qty: 30 | Fill #7

## 2018-08-21 MED FILL — LISINOPRIL 5 MG TABLET: 5 | 90 days supply | Qty: 90 | Fill #1

## 2018-08-30 MED FILL — COMBIGAN EYE DROPS: 0.2-0.5 | 50 days supply | Qty: 5 | Fill #1

## 2018-08-30 MED FILL — TRESIBA FLEXTOUCH 100 UNITS: 100 | 27 days supply | Qty: 18 | Fill #2

## 2018-08-30 MED FILL — ROCKLATAN 0.02-0.005 % SOLN: 0.02-0.005 | 25 days supply | Qty: 3 | Fill #1

## 2018-09-07 ENCOUNTER — Other Ambulatory Visit (INDEPENDENT_AMBULATORY_CARE_PROVIDER_SITE_OTHER): Payer: 59

## 2018-09-07 ENCOUNTER — Encounter: Payer: Self-pay | Admitting: Internal Medicine

## 2018-09-07 ENCOUNTER — Ambulatory Visit: Payer: 59 | Admitting: Internal Medicine

## 2018-09-07 VITALS — BP 130/80 | HR 84 | Temp 98.5°F | Ht 65.0 in | Wt 267.0 lb

## 2018-09-07 DIAGNOSIS — IMO0002 Reserved for concepts with insufficient information to code with codable children: Secondary | ICD-10-CM

## 2018-09-07 DIAGNOSIS — E118 Type 2 diabetes mellitus with unspecified complications: Secondary | ICD-10-CM

## 2018-09-07 DIAGNOSIS — E1165 Type 2 diabetes mellitus with hyperglycemia: Secondary | ICD-10-CM

## 2018-09-07 DIAGNOSIS — G8929 Other chronic pain: Secondary | ICD-10-CM

## 2018-09-07 DIAGNOSIS — R52 Pain, unspecified: Secondary | ICD-10-CM

## 2018-09-07 LAB — CBC
HCT: 37.2 % (ref 36.0–46.0)
Hemoglobin: 12.7 g/dL (ref 12.0–15.0)
MCHC: 34.1 g/dL (ref 30.0–36.0)
MCV: 83.2 fl (ref 78.0–100.0)
Platelets: 221 10*3/uL (ref 150.0–400.0)
RBC: 4.47 Mil/uL (ref 3.87–5.11)
RDW: 14 % (ref 11.5–15.5)
WBC: 7.4 10*3/uL (ref 4.0–10.5)

## 2018-09-07 LAB — COMPREHENSIVE METABOLIC PANEL
ALT: 19 U/L (ref 0–35)
AST: 17 U/L (ref 0–37)
Albumin: 4 g/dL (ref 3.5–5.2)
Alkaline Phosphatase: 72 U/L (ref 39–117)
BUN: 12 mg/dL (ref 6–23)
CO2: 27 mEq/L (ref 19–32)
Calcium: 9.2 mg/dL (ref 8.4–10.5)
Chloride: 106 mEq/L (ref 96–112)
Creatinine, Ser: 0.76 mg/dL (ref 0.40–1.20)
GFR: 101.28 mL/min (ref >60.00)
Glucose, Bld: 170 mg/dL — ABNORMAL HIGH (ref 70–99)
Potassium: 4.1 mEq/L (ref 3.5–5.1)
Sodium: 139 mEq/L (ref 135–145)
Total Bilirubin: 0.4 mg/dL (ref 0.2–1.2)
Total Protein: 6.6 g/dL (ref 6.0–8.3)

## 2018-09-07 LAB — LIPID PANEL
Cholesterol: 164 mg/dL (ref 0–200)
HDL: 40.9 mg/dL (ref >39.00)
LDL Cholesterol: 89 mg/dL (ref 0–99)
NonHDL: 122.85
Total CHOL/HDL Ratio: 4
Triglycerides: 171 mg/dL — ABNORMAL HIGH (ref 0.0–149.0)
VLDL: 34.2 mg/dL (ref 0.0–40.0)

## 2018-09-07 NOTE — Assessment & Plan Note (Signed)
She states taking lyrica BID but we did call her pharmacy and obtain fill record of lyrica with last fill 06/15/18 for #60 and prior 04/05/18 #60 and prior 12/14/17 #60 which is not concordant. She has no explanation of this and states again she is taking BID most of the time and is not out yet of medication. She also admits to being able to work most of the time unless she has time off due to the hassle of getting short term disability money so will update FMLA to reflect her time off to 4 days per month. She is asked again to think about if she could switch jobs as her job appears to have little flexibility in schedule and demand for her time but she is not interested today in that.

## 2018-09-07 NOTE — Patient Instructions (Addendum)
We will get the FMLA papers filled out as you have told me you need.   We need to see you back in 1-2 months for a physical to talk about your health and continue all your medicines.

## 2018-09-07 NOTE — Assessment & Plan Note (Signed)
She is not currently taking any cholesterol medication and she is not sure why. Checking lipid panel as none in several years due to lack of routine follow up (only comes in for visit to get FMLA and then does not return for routine care). Seeing endo and not at goal and her compliance is suspect given the discordance with her stated history with medications and refill history.

## 2018-09-07 NOTE — Assessment & Plan Note (Signed)
Weight increasing over the last several years due to several etiology including medications. She is not motivated for change currently.

## 2018-09-07 NOTE — Progress Notes (Signed)
   Subjective:   Patient ID: Bridget [name], female    DOB: 12/27/1960, 58 y.o.   MRN: [id number]  HPI  The patient is a 58 YO female coming in for renewal of FMLA per her words. She does have intermittent FMLA due to neuropathy and fibromyalgia (told lupus at one time but no documentation of this). She is using lyrica mainly for pain which is helpful. 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 She states that she is taking 150 mg lyrica BID and does not miss more than 1-2 doses per week. She had prescription to take lyrica 50 mg during shifts but she does not fill this as insurance will only let her fill one strength coverage. When asked about how often she misses work due to pain she states missing work about 3-4 days per month due to the hassle of having to file short term disability and wait for money if she does not have enough PAL so she just goes to work. She does admit that she hurts though. She does follow with endo for her diabetes and it appears to be under worse control recently and is not well controlled. She does not have medication list with her and is not sure what all she is taking or not taking.   PMH, [facility], social history reviewed and updated.   Review of Systems  Constitutional: Negative.   HENT: Negative.   Eyes: Negative.   Respiratory: Negative for cough, chest tightness and shortness of breath.   Cardiovascular: Negative for chest pain, palpitations and leg swelling.  Gastrointestinal: Negative for abdominal distention, abdominal pain, constipation, diarrhea, nausea and vomiting.  Musculoskeletal: Positive for arthralgias and myalgias.  Skin: Negative.   Neurological: Negative.   Psychiatric/Behavioral: Negative.     Objective:  Physical Exam Constitutional:      Appearance: She is well-developed.  HENT:     Head: Normocephalic and atraumatic.  Neck:     Musculoskeletal: Normal range of motion.    Cardiovascular:     Rate and Rhythm: Normal rate and regular rhythm.  Pulmonary:     Effort: Pulmonary effort is normal. No respiratory distress.     Breath sounds: Normal breath sounds. No wheezing or rales.  Abdominal:     General: Bowel sounds are normal. There is no distension.     Palpations: Abdomen is soft.     Tenderness: There is no abdominal tenderness. There is no rebound.  Skin:    General: Skin is warm and dry.  Neurological:     Mental Status: She is alert and oriented to person, place, and time.     Coordination: Coordination normal.     Vitals:   09/07/18 1333  BP: 130/80  Pulse: 84  Temp: 98.5 F (36.9 C)  TempSrc: Oral  SpO2: 96%  Weight: 267 lb (121.1 kg)  Height: 5\' 5"  (1.651 m)    Assessment & Plan:  Visit time 40 minutes: greater than 50% of that time was spent in face to face counseling and coordination of care with the patient: counseled about nature of disabling condition causing her to miss work, medications which help, extensive review of medication list as many are old medications and not accurate and she admits to taking things not on her current list, as well as trying to elicit an accurate history of her medications

## 2018-09-11 ENCOUNTER — Telehealth: Payer: Self-pay | Admitting: Internal Medicine

## 2018-09-11 MED FILL — BROMOCRIPTINE 2.5 MG TABLET: 2.5 | 30 days supply | Qty: 15 | Fill #4

## 2018-09-11 MED FILL — UNIFINE PENTIPS 31GX3/16": 31G X 5 MM | 30 days supply | Qty: 100 | Fill #1

## 2018-09-11 MED FILL — UNIFINE PENTIPS 31GX3/16: 31G X 5 MM | 30 days supply | Qty: 100 | Fill #1

## 2018-09-11 NOTE — Telephone Encounter (Signed)
Spoke with patient see lab note

## 2018-09-11 NOTE — Telephone Encounter (Signed)
Copied from CRM [id number]. Topic: Quick Communication - Lab Results (Clinic Use ONLY) >> Sep 10, 2018  2:57 PM Josetta Huddle, CMA wrote: Notified patient regarding lab results.  Patient to call back in regard to cholesterol med. PEC can give information in recent lab results

## 2018-09-17 ENCOUNTER — Other Ambulatory Visit: Payer: Self-pay | Admitting: Endocrinology

## 2018-09-17 ENCOUNTER — Telehealth: Payer: Self-pay | Admitting: Internal Medicine

## 2018-09-17 MED FILL — ACCU-CHEK GUIDE TEST STRIP: 90 days supply | Qty: 200 | Fill #1

## 2018-09-17 MED FILL — ACCU-CHEK FASTCLIX LANCETS: 90 days supply | Qty: 204 | Fill #0

## 2018-09-17 MED FILL — MELOXICAM 15 MG TABLET: 15 | 30 days supply | Qty: 30 | Fill #2

## 2018-09-17 NOTE — Telephone Encounter (Signed)
Copied from CRM [id number]. Topic: General - Other >> Sep 14, 2018  3:54 PM Fanny Bien wrote: Reason for CRM: pt called and wanted to know if FMLA paperwork has been received. Please advise  I have not seen this paperwork. I do know Dr.Crawford wants these forms once they come. Have you seen them?

## 2018-09-17 NOTE — Telephone Encounter (Signed)
I have not seen any FMLA forms

## 2018-09-21 DIAGNOSIS — Z01411 Encounter for gynecological examination (general) (routine) with abnormal findings: Secondary | ICD-10-CM | POA: Diagnosis not present

## 2018-09-21 DIAGNOSIS — Z6841 Body Mass Index (BMI) 40.0 and over, adult: Secondary | ICD-10-CM | POA: Diagnosis not present

## 2018-09-21 DIAGNOSIS — B009 Herpesviral infection, unspecified: Secondary | ICD-10-CM | POA: Diagnosis not present

## 2018-09-21 DIAGNOSIS — Z1231 Encounter for screening mammogram for malignant neoplasm of breast: Secondary | ICD-10-CM | POA: Diagnosis not present

## 2018-09-21 DIAGNOSIS — R208 Other disturbances of skin sensation: Secondary | ICD-10-CM | POA: Diagnosis not present

## 2018-09-21 NOTE — Telephone Encounter (Signed)
We received the FMLA today via fax, Forms have been laid on providers desk. She will return to the office on 1/29.

## 2018-09-26 MED FILL — PANTOPRAZOLE SOD DR 20 MG T: 20 | 30 days supply | Qty: 30 | Fill #8

## 2018-09-26 MED FILL — TRESIBA FLEXTOUCH 100 UNITS: 100 | 27 days supply | Qty: 18 | Fill #3

## 2018-09-26 MED FILL — PREGABALIN 150 MG CAPS: 150 | 30 days supply | Qty: 60 | Fill #2

## 2018-09-26 NOTE — Telephone Encounter (Signed)
Pt calling and states the FMLA forms are due tomorrow to [employer]. Pt aware of the 5-7 business day turnaround time.

## 2018-09-27 DIAGNOSIS — Z0279 Encounter for issue of other medical certificate: Secondary | ICD-10-CM

## 2018-09-28 NOTE — Telephone Encounter (Signed)
Forms have been completed & signed by provider, Faxed, Copy sent to scan &charged for.

## 2018-10-02 ENCOUNTER — Encounter: Payer: Self-pay | Admitting: Endocrinology

## 2018-10-02 ENCOUNTER — Ambulatory Visit: Payer: 59 | Admitting: Endocrinology

## 2018-10-02 VITALS — BP 124/64 | HR 76 | Ht 65.0 in | Wt 260.0 lb

## 2018-10-02 DIAGNOSIS — IMO0002 Reserved for concepts with insufficient information to code with codable children: Secondary | ICD-10-CM

## 2018-10-02 DIAGNOSIS — E118 Type 2 diabetes mellitus with unspecified complications: Secondary | ICD-10-CM

## 2018-10-02 DIAGNOSIS — E1165 Type 2 diabetes mellitus with hyperglycemia: Secondary | ICD-10-CM

## 2018-10-02 LAB — POCT GLYCOSYLATED HEMOGLOBIN (HGB A1C): Hemoglobin A1C: 7.9 % — AB (ref 4.0–5.6)

## 2018-10-02 NOTE — Patient Instructions (Addendum)
On this type of insulin schedule, you should eat meals on a regular schedule.  If a meal is missed or significantly delayed, your blood sugar could go low.   Please continue the same diabetes medications. check your blood sugar once a day.  vary the time of day when you check, between before the 3 meals, and at bedtime.  also check if you have symptoms of your blood sugar being too high or too low.  please keep a record of the readings and bring it to your next appointment here (or you can bring the meter itself).  You can write it on any piece of paper.  please call us sooner if your blood sugar goes below 70, or if you have a lot of readings over 200.  With the [place], if you miss it, you can still take the full amount 12 hours later.   Please come back for a follow-up appointment in 3 months.

## 2018-10-02 NOTE — Progress Notes (Signed)
Subjective:    Patient ID: Bridget [name], female    DOB: February 20, 1961, 58 y.o.   MRN: [id number]  HPI Pt returns for f/u of diabetes mellitus: DM type: Insulin-requiring type 2.  Dx'ed: 3825 Complications: polyneuropathy.   Therapy: insulin + bromocriptine.    GDM: never.  DKA: never.   Severe hypoglycemia: never.   Pancreatitis: never.  Other: she did not tolerate invokana (vaginitis) or metformin-XR (diarrhea); edema precludes pioglitizone rx.  she also took insulin from 2000-2003; she resumed in 2017, She declines multiple daily injections; she works 3rd shift.  Interval history: no recent steriods.  she brings a record of her cbg's which I have reviewed today.  It varies from 67-237.  It is in general higher is she has recently eaten.  Pt says she never misses the insulin.   Past Medical History:  Diagnosis Date  . Bacterial vaginosis 12/07/2010  . Depression   . Diabetes mellitus without complication ([place])   . Diverticulosis   . DIVERTICULOSIS, COLON 10/26/2006  . Fibromyalgia   . GERD (gastroesophageal reflux disease)   . Glaucoma   . Headache   . HEEL SPUR 09/10/2007   Qualifier: Diagnosis of  By: Drue Flirt  MD, Merrily Brittle    . Hypertension   . MALAISE AND FATIGUE 05/27/2010  . Obesity   . OBESITY, NOS 10/26/2006  . Osteoarthritis   . OSTEOARTHRITIS, KNEE 04/27/2010  . Retained tampon 05/21/2012  . SHOULDER PAIN, LEFT 05/19/2010   Qualifier: Diagnosis of  By: Annamary Carolin MD, Amber    . Vaginal yeast infection 12/08/2010    Past Surgical History:  Procedure Laterality Date  . ABDOMINAL HYSTERECTOMY  1990  . ABDOMINAL HYSTERECTOMY    . BLADDER SURGERY  2001  . CATARACT EXTRACTION Right 2001   with implant  . EYE SURGERY     implant rt eye  . ROTATOR CUFF REPAIR Right   . SHOULDER ARTHROSCOPY WITH SUBACROMIAL DECOMPRESSION Left 07/20/2015   Procedure: SHOULDER ARTHROSCOPY WITH DEBRIDEMENT OF ROTATOR CUFF AND SUBACROMIAL DECOMPRESSION ;  Surgeon: Tania Ade, MD;   Location: [facility];  Service: Orthopedics;  Laterality: Left;  Left shoulder arthroscopy with debridement of rotator cuff and subacromial decompression    Social History   Socioeconomic History  . Marital status: Divorced    Spouse name: Not on file  . Number of children: 2  . Years of education: Not on file  . Highest education level: Not on file  Occupational History  . Occupation: Radio producer: [employer]  . Financial resource strain: Not on file  . Food insecurity:    Worry: Not on file    Inability: Not on file  . Transportation needs:    Medical: Not on file    Non-medical: Not on file  Tobacco Use  . Smoking status: Former Smoker    Packs/day: 0.30    Types: Cigarettes    Last attempt to quit: 08/29/1998    Years since quitting: 20.1  . Smokeless tobacco: Never Used  . Tobacco comment: Quit in 2000   Substance and Sexual Activity  . Alcohol use: Yes    Comment: socially  . Drug use: No  . Sexual activity: Yes    Birth control/protection: Surgical  Lifestyle  . Physical activity:    Days per week: Not on file    Minutes per session: Not on file  . Stress: Not on file  Relationships  . Social connections:  Talks on phone: Not on file    Gets together: Not on file    Attends religious service: Not on file    Active member of club or organization: Not on file    Attends meetings of clubs or organizations: Not on file    Relationship status: Not on file  . Intimate partner violence:    Fear of current or ex partner: Not on file    Emotionally abused: Not on file    Physically abused: Not on file    Forced sexual activity: Not on file  Other Topics Concern  . Not on file  Social History Narrative   Works at [employer] as a Museum/gallery conservator    Current Outpatient Medications on File Prior to Visit  Medication Sig Dispense Refill  . ACCU-CHEK FASTCLIX LANCETS MISC USE TO CHECK BLOOD SUGAR 2 TIMES PER DAY. 204 each 2   . ACCU-CHEK GUIDE test strip USE TO CHECK BLOOD SUGAR 2 TIMES PER DAY. 200 each 2  . aspirin 81 MG tablet Take 1 tablet (81 mg total) by mouth daily.    . Blood Glucose Monitoring Suppl (ACCU-CHEK GUIDE) w/Device KIT 2 each by Does not apply route daily. 1 kit 2  . bromocriptine (PARLODEL) 2.5 MG tablet TAKE 1/2 TABLET BY MOUTH ONCE DAILY 15 tablet 11  . cetirizine (ZYRTEC) 10 MG tablet Take 1 tablet (10 mg total) by mouth daily. 30 tablet 11  . diclofenac sodium (VOLTAREN) 1 % GEL Apply 2 g topically 4 (four) times daily. Rub into affected area of foot 2 to 4 times daily 100 g 2  . docusate sodium (COLACE) 100 MG capsule Take 1 capsule (100 mg total) by mouth 3 (three) times daily as needed. 20 capsule 0  . fluticasone (FLONASE) 50 MCG/ACT nasal spray Place 2 sprays into both nostrils daily. 16 g 0  . HUMALOG KWIKPEN 100 UNIT/ML KiwkPen INJECT 15 UNITS INTO THE SKIN DAILY WITH THE BIGGEST MEAL. 15 mL 11  . insulin degludec (TRESIBA FLEXTOUCH) 100 UNIT/ML SOPN FlexTouch Pen Inject 0.7 mLs (70 Units total) into the skin daily. 20 pen 3  . lisinopril (PRINIVIL,ZESTRIL) 5 MG tablet Take 1 tablet (5 mg total) by mouth daily. 90 tablet 1  . LYRICA 150 MG capsule TAKE 1 CAPSULE BY MOUTH TWICE DAILY NEEDS VISIT FOR MORE REFILL 60 capsule 2  . pantoprazole (PROTONIX) 20 MG tablet TAKE 1 TABLET BY MOUTH DAILY. 30 tablet PRN  . traMADol (ULTRAM) 50 MG tablet Take 1 tablet (50 mg total) by mouth every 6 (six) hours as needed. 15 tablet 0  . triamcinolone (NASACORT AQ) 55 MCG/ACT AERO nasal inhaler Place 2 sprays into the nose daily. 1 Inhaler 12  . UNIFINE PENTIPS 31G X 5 MM MISC USE FOR INSULIN INJECTION 3 TIMES PER DAY 100 each 8   No current facility-administered medications on file prior to visit.     Allergies  Allergen Reactions  . Sulfa Antibiotics Hives and Rash  . Citrus Itching  . Invokana [Canagliflozin] Other (See Comments)    Caused frequent vaginal yeast infections  . Codeine Hives  .  Metformin And Related Diarrhea and Nausea And Vomiting  . Metoclopramide Hcl Nausea And Vomiting  . Penicillins Nausea And Vomiting  . Propoxyphene Nausea And Vomiting    GI upset  . Propoxyphene N-Acetaminophen Nausea And Vomiting  . Sulfonamide Derivatives Hives    Family History  Problem Relation Age of Onset  . Breast cancer Mother   . Diabetes  Mother   . Ovarian cancer Maternal Grandmother   . Heart disease Maternal Grandmother        great  . Stomach cancer Cousin   . Diabetes Brother        x 3   BP 124/64 (BP Location: Right Arm, Patient Position: Sitting, Cuff Size: Large)   Pulse 76   Ht '5\' 5"'  (1.651 m)   Wt 260 lb (117.9 kg)   SpO2 94%   BMI 43.27 kg/m   Review of Systems She denies LOC.      Objective:   Physical Exam VITAL SIGNS:  See vs page GENERAL: no distress Pulses: dorsalis pedis intact bilat.   MSK: no deformity of the feet CV: trace bilat leg edema Skin:  no ulcer on the feet.  normal color and temp on the feet. Neuro: sensation is intact to touch on the feet   Lab Results  Component Value Date   HGBA1C 7.9 (A) 10/02/2018       Assessment & Plan:  Insulin-requiring type 2 DM, with PN: this is the best control this pt should aim for, given this regimen, which does match insulin to her changing needs throughout the day Hypoglycemia: this limits aggressiveness of glycemic control Occupational status (3rd shift): she needs a flat insulin action profile throughout the day and night.  Patient Instructions  On this type of insulin schedule, you should eat meals on a regular schedule.  If a meal is missed or significantly delayed, your blood sugar could go low.   Please continue the same diabetes medications  check your blood sugar once a day.  vary the time of day when you check, between before the 3 meals, and at bedtime.  also check if you have symptoms of your blood sugar being too high or too low.  please keep a record of the readings and  bring it to your next appointment here (or you can bring the meter itself).  You can write it on any piece of paper.  please call us sooner if your blood sugar goes below 70, or if you have a lot of readings over 200.  With the [place], if you miss it, you can still take the full amount 12 hours later.   Please come back for a follow-up appointment in 3 months.

## 2018-10-11 MED FILL — ROCKLATAN 0.02-0.005 % SOLN: 0.02-0.005 | 25 days supply | Qty: 3 | Fill #2 | Status: TO

## 2018-10-11 MED FILL — BROMOCRIPTINE 2.5 MG TABLET: 2.5 | 30 days supply | Qty: 15 | Fill #5

## 2018-10-20 ENCOUNTER — Telehealth: Payer: 59 | Admitting: Nurse Practitioner

## 2018-10-20 DIAGNOSIS — R6889 Other general symptoms and signs: Secondary | ICD-10-CM | POA: Diagnosis not present

## 2018-10-20 DIAGNOSIS — J111 Influenza due to unidentified influenza virus with other respiratory manifestations: Secondary | ICD-10-CM

## 2018-10-20 NOTE — Progress Notes (Signed)
E visit for Flu like symptoms   We are sorry that you are not feeling well.  Here is how we plan to help! Based on what you have shared with me it looks like you may have a respiratory virus that may be influenza.  Influenza or "the flu" is   an infection caused by a respiratory virus. The flu virus is highly contagious and persons who did not receive their yearly flu vaccination may "catch" the flu from close contact.  We have anti-viral medications to treat the viruses that cause this infection. They are not a "cure" and only shorten the course of the infection. These prescriptions are most effective when they are given within the first 2 days of "flu" symptoms. Antiviral medication are indicated if you have a high risk of complications from the flu. You should  also consider an antiviral medication if you are in close contact with someone who is at risk. These medications can help patients avoid complications from the flu  but have side effects that you should know. Possible side effects from Tamiflu or oseltamivir include nausea, vomiting, diarrhea, dizziness, headaches, eye redness, sleep problems or other respiratory symptoms. You are out of the treatment window for tamiflu. Theraflu over the counter can help with symptoms.  Based upon your symptoms and potential risk factors I recommend that you follow the flu symptoms recommendation that I have listed below.  ANYONE WHO HAS FLU SYMPTOMS SHOULD: . Stay home. The flu is highly contagious and going out or to work exposes others! . Be sure to drink plenty of fluids. Water is fine as well as fruit juices, sodas and electrolyte beverages. You may want to stay away from caffeine or alcohol. If you are nauseated, try taking small sips of liquids. How do you know if you are getting enough fluid? Your urine should be a pale yellow or almost colorless. . Get rest. . Taking a steamy shower or using a humidifier may help nasal congestion and ease sore  throat pain. Using a saline nasal spray works much the same way. . Cough drops, hard candies and sore throat lozenges may ease your cough. . Line up a caregiver. Have someone check on you regularly.   GET HELP RIGHT AWAY IF: . You cannot keep down liquids or your medications. . You become short of breath . Your fell like you are going to pass out or loose consciousness. . Your symptoms persist after you have completed your treatment plan MAKE SURE YOU   Understand these instructions.  Will watch your condition.  Will get help right away if you are not doing well or get worse.  Your e-visit answers were reviewed by a board certified advanced clinical practitioner to complete your personal care plan.  Depending on the condition, your plan could have included both over the counter or prescription medications.  If there is a problem please reply  once you have received a response from your provider.  Your safety is important to [place].  If you have drug allergies check your prescription carefully.    You can use MyChart to ask questions about today's visit, request a non-urgent call back, or ask for a work or school excuse for 24 hours related to this e-Visit. If it has been greater than 24 hours you will need to follow up with your provider, or enter a new e-Visit to address those concerns.  You will get an e-mail in the next two days asking about your experience.  I hope that your e-visit has been valuable and will speed your recovery. Thank you for using e-visits.  5 minutes spent reviewing and documenting in chart.  

## 2018-10-22 ENCOUNTER — Encounter: Payer: Self-pay | Admitting: Internal Medicine

## 2018-10-22 ENCOUNTER — Ambulatory Visit: Payer: 59 | Admitting: Internal Medicine

## 2018-10-22 DIAGNOSIS — J111 Influenza due to unidentified influenza virus with other respiratory manifestations: Secondary | ICD-10-CM | POA: Diagnosis not present

## 2018-10-22 MED ORDER — METHYLPREDNISOLONE ACETATE 40 MG/ML IJ SUSP
40.0000 mg | Freq: Once | INTRAMUSCULAR | Status: AC
  Administered 2018-10-22: 40 mg via INTRAMUSCULAR

## 2018-10-22 MED ORDER — BENZONATATE 200 MG PO CAPS: 200.0000 mg | ORAL_CAPSULE | Freq: Three times a day (TID) | ORAL | 0 refills | Status: DC | PRN

## 2018-10-22 MED FILL — BENZONATATE 200 MG CAPS: 200 | 20 days supply | Qty: 60 | Fill #0

## 2018-10-22 NOTE — Patient Instructions (Addendum)
We have given you the steroid shot today and sent in the cough medicine.

## 2018-10-22 NOTE — Progress Notes (Signed)
   Subjective:   Patient ID: Bridget [name], female    DOB: Jan 14, 1961, 58 y.o.   MRN: [id number]  HPI The patient is a 58 y.o. female coming in for cold symptoms. Started 5 days ago. Main symptoms are: cough, body aches and fevers. Denies SOB. Overall it is worsening. Had grandchild diagnosed with flu last Wednesday. Has tried elderberry cold and flu which has not helped much.   Review of Systems  Constitutional: Positive for activity change, appetite change, chills, fatigue and fever. Negative for unexpected weight change.  HENT: Positive for congestion, postnasal drip, rhinorrhea and sinus pressure. Negative for ear discharge, ear pain, sinus pain, sneezing, sore throat, tinnitus, trouble swallowing and voice change.   Eyes: Negative.   Respiratory: Positive for cough. Negative for chest tightness, shortness of breath and wheezing.   Cardiovascular: Negative.   Gastrointestinal: Negative.   Musculoskeletal: Positive for myalgias.  Neurological: Positive for headaches.    Objective:  Physical Exam Constitutional:      Appearance: She is well-developed.  HENT:     Head: Normocephalic and atraumatic.     Comments: Oropharynx with redness and clear drainage, nose with swollen turbinates, TMs normal bilaterally.  Neck:     Musculoskeletal: Normal range of motion.     Thyroid: No thyromegaly.  Cardiovascular:     Rate and Rhythm: Normal rate and regular rhythm.  Pulmonary:     Effort: Pulmonary effort is normal. No respiratory distress.     Breath sounds: Normal breath sounds. No wheezing or rales.  Abdominal:     Palpations: Abdomen is soft.  Musculoskeletal:        General: Tenderness present.  Lymphadenopathy:     Cervical: No cervical adenopathy.  Skin:    General: Skin is warm and dry.  Neurological:     Mental Status: She is alert and oriented to person, place, and time.     Vitals:   10/22/18 0844  BP: 140/82  Pulse: 98  Temp: 99.8 F (37.7 C)  TempSrc: Oral    SpO2: 95%  Weight: 260 lb (117.9 kg)  Height: 5\' 5"  (1.651 m)    Assessment & Plan:  Depo-medrol 40 mg IM

## 2018-10-22 NOTE — Assessment & Plan Note (Signed)
Depo-medrol 40 mg IM given, rx for tessalon perles. Work note given.

## 2018-10-26 MED FILL — HUMALOG 100 UNITS/ML KWIKPE: 100 | 90 days supply | Qty: 15 | Fill #1

## 2018-10-26 MED FILL — TRESIBA FLEXTOUCH 100 UNITS: 100 | 18 days supply | Qty: 12 | Fill #4

## 2018-10-26 MED FILL — UNIFINE PENTIPS 31GX3/16": 31G X 5 MM | 30 days supply | Qty: 100 | Fill #2

## 2018-10-26 MED FILL — UNIFINE PENTIPS 31GX3/16: 31G X 5 MM | 30 days supply | Qty: 100 | Fill #2 | Status: TO

## 2018-10-26 MED FILL — PANTOPRAZOLE SOD DR 20 MG T: 20 | 30 days supply | Qty: 30 | Fill #9

## 2018-11-06 ENCOUNTER — Encounter: Payer: Self-pay | Admitting: Internal Medicine

## 2018-11-14 MED FILL — TRESIBA FLEXTOUCH 100 UNITS: 100 | 25 days supply | Qty: 18 | Fill #0 | Status: TO

## 2018-11-14 MED FILL — MELOXICAM 15 MG TABLET: 15 | 30 days supply | Qty: 30 | Fill #0 | Status: TO

## 2018-11-15 MED FILL — BROMOCRIPTINE 2.5 MG TABLET: 2.5 | 30 days supply | Qty: 15 | Fill #6 | Status: TO

## 2018-12-11 ENCOUNTER — Other Ambulatory Visit: Payer: Self-pay | Admitting: Internal Medicine

## 2018-12-11 MED FILL — ROCKLATAN 0.02-0.005 % SOLN: 0.02-0.005 | 25 days supply | Qty: 3 | Fill #0

## 2018-12-11 MED FILL — TRESIBA FLEXTOUCH 100 UNITS: 100 | 25 days supply | Qty: 18 | Fill #0

## 2018-12-11 MED FILL — BROMOCRIPTINE 2.5 MG TABLET: 2.5 | 30 days supply | Qty: 15 | Fill #0

## 2018-12-11 MED FILL — PANTOPRAZOLE SOD DR 20 MG T: 20 | 90 days supply | Qty: 90 | Fill #0

## 2018-12-18 MED FILL — COMBIGAN EYE DROPS: 0.2-0.5 | 50 days supply | Qty: 5 | Fill #0

## 2018-12-31 ENCOUNTER — Ambulatory Visit: Payer: 59 | Admitting: Endocrinology

## 2018-12-31 ENCOUNTER — Other Ambulatory Visit: Payer: Self-pay | Admitting: Internal Medicine

## 2018-12-31 ENCOUNTER — Other Ambulatory Visit: Payer: Self-pay

## 2018-12-31 ENCOUNTER — Encounter: Payer: Self-pay | Admitting: Endocrinology

## 2018-12-31 VITALS — BP 132/82 | HR 83 | Temp 98.2°F | Wt 263.2 lb

## 2018-12-31 DIAGNOSIS — E1165 Type 2 diabetes mellitus with hyperglycemia: Secondary | ICD-10-CM | POA: Diagnosis not present

## 2018-12-31 DIAGNOSIS — IMO0002 Reserved for concepts with insufficient information to code with codable children: Secondary | ICD-10-CM

## 2018-12-31 DIAGNOSIS — E118 Type 2 diabetes mellitus with unspecified complications: Secondary | ICD-10-CM

## 2018-12-31 LAB — POCT GLYCOSYLATED HEMOGLOBIN (HGB A1C): Hemoglobin A1C: 8.1 % — AB (ref 4.0–5.6)

## 2018-12-31 MED ORDER — INSULIN LISPRO (1 UNIT DIAL) 100 UNIT/ML (KWIKPEN): 12.0000 [IU] | PEN_INJECTOR | Freq: Every day | SUBCUTANEOUS | 11 refills | Status: DC

## 2018-12-31 MED ORDER — INSULIN DEGLUDEC 100 UNIT/ML ~~LOC~~ SOPN: 70.0000 [IU] | PEN_INJECTOR | Freq: Every day | SUBCUTANEOUS | 3 refills | Status: DC

## 2018-12-31 MED FILL — LISINOPRIL 5 MG TABLET: 5 | 90 days supply | Qty: 90 | Fill #0

## 2018-12-31 MED FILL — MELOXICAM 15 MG TABLET: 15 | 30 days supply | Qty: 30 | Fill #0

## 2018-12-31 NOTE — Patient Instructions (Addendum)
Please change the insulins to the numbers listed below. On this type of insulin schedule, you should eat meals on a regular schedule.  If a meal is missed or significantly delayed, your blood sugar could go low.   Please continue the same bromocriptine.    check your blood sugar once a day.  vary the time of day when you check, between before the 3 meals, and at bedtime.  also check if you have symptoms of your blood sugar being too high or too low.  please keep a record of the readings and bring it to your next appointment here (or you can bring the meter itself).  You can write it on any piece of paper.  please call us sooner if your blood sugar goes below 70, or if you have a lot of readings over 200.  With the [place], if you miss it, you can still take the full amount 12 hours later.   Please come back for a follow-up appointment in 2 months.

## 2018-12-31 NOTE — Progress Notes (Signed)
Subjective:    Patient ID: Bridget [name], female    DOB: 02-18-1961, 58 y.o.   MRN: [id number]  HPI Pt returns for f/u of diabetes mellitus: DM type: Insulin-requiring type 2.  Dx'ed: 3300 Complications: polyneuropathy.   Therapy: insulin + bromocriptine.    GDM: never.  DKA: never.   Severe hypoglycemia: never.   Pancreatitis: never.  Other: she did not tolerate invokana (vaginitis) or metformin-XR (diarrhea); edema precludes pioglitizone rx.  she also took insulin from 2000-2003; she resumed in 2017, She declines multiple daily injections; she works 3rd shift.  Interval history: no recent steriods.  she brings a record of her cbg's which I have reviewed today.  It varies from 74-229.  It is lowest at HS.  She takes tresiba 65/d, and humalog, 15 units qd with supper.  Pt says she never misses the insulin.   Past Medical History:  Diagnosis Date  . Bacterial vaginosis 12/07/2010  . Depression   . Diabetes mellitus without complication ([place])   . Diverticulosis   . DIVERTICULOSIS, COLON 10/26/2006  . Fibromyalgia   . GERD (gastroesophageal reflux disease)   . Glaucoma   . Headache   . HEEL SPUR 09/10/2007   Qualifier: Diagnosis of  By: Drue Flirt  MD, Merrily Brittle    . Hypertension   . MALAISE AND FATIGUE 05/27/2010  . Obesity   . OBESITY, NOS 10/26/2006  . Osteoarthritis   . OSTEOARTHRITIS, KNEE 04/27/2010  . Retained tampon 05/21/2012  . SHOULDER PAIN, LEFT 05/19/2010   Qualifier: Diagnosis of  By: Annamary Carolin MD, Amber    . Vaginal yeast infection 12/08/2010    Past Surgical History:  Procedure Laterality Date  . ABDOMINAL HYSTERECTOMY  1990  . ABDOMINAL HYSTERECTOMY    . BLADDER SURGERY  2001  . CATARACT EXTRACTION Right 2001   with implant  . EYE SURGERY     implant rt eye  . ROTATOR CUFF REPAIR Right   . SHOULDER ARTHROSCOPY WITH SUBACROMIAL DECOMPRESSION Left 07/20/2015   Procedure: SHOULDER ARTHROSCOPY WITH DEBRIDEMENT OF ROTATOR CUFF AND SUBACROMIAL DECOMPRESSION ;   Surgeon: Tania Ade, MD;  Location: [facility];  Service: Orthopedics;  Laterality: Left;  Left shoulder arthroscopy with debridement of rotator cuff and subacromial decompression    Social History   Socioeconomic History  . Marital status: Divorced    Spouse name: Not on file  . Number of children: 2  . Years of education: Not on file  . Highest education level: Not on file  Occupational History  . Occupation: Radio producer: [employer]  . Financial resource strain: Not on file  . Food insecurity:    Worry: Not on file    Inability: Not on file  . Transportation needs:    Medical: Not on file    Non-medical: Not on file  Tobacco Use  . Smoking status: Former Smoker    Packs/day: 0.30    Types: Cigarettes    Last attempt to quit: 08/29/1998    Years since quitting: 20.3  . Smokeless tobacco: Never Used  . Tobacco comment: Quit in 2000   Substance and Sexual Activity  . Alcohol use: Yes    Comment: socially  . Drug use: No  . Sexual activity: Yes    Birth control/protection: Surgical  Lifestyle  . Physical activity:    Days per week: Not on file    Minutes per session: Not on file  . Stress: Not on file  Relationships  . Social connections:    Talks on phone: Not on file    Gets together: Not on file    Attends religious service: Not on file    Active member of club or organization: Not on file    Attends meetings of clubs or organizations: Not on file    Relationship status: Not on file  . Intimate partner violence:    Fear of current or ex partner: Not on file    Emotionally abused: Not on file    Physically abused: Not on file    Forced sexual activity: Not on file  Other Topics Concern  . Not on file  Social History Narrative   Works at [employer] as a Museum/gallery conservator    Current Outpatient Medications on File Prior to Visit  Medication Sig Dispense Refill  . ACCU-CHEK FASTCLIX LANCETS MISC USE TO CHECK BLOOD  SUGAR 2 TIMES PER DAY. 204 each 2  . ACCU-CHEK GUIDE test strip USE TO CHECK BLOOD SUGAR 2 TIMES PER DAY. 200 each 2  . aspirin 81 MG tablet Take 1 tablet (81 mg total) by mouth daily.    . benzonatate (TESSALON) 200 MG capsule Take 1 capsule (200 mg total) by mouth 3 (three) times daily as needed. 60 capsule 0  . Blood Glucose Monitoring Suppl (ACCU-CHEK GUIDE) w/Device KIT 2 each by Does not apply route daily. 1 kit 2  . bromocriptine (PARLODEL) 2.5 MG tablet TAKE 1/2 TABLET BY MOUTH ONCE DAILY 15 tablet 11  . cetirizine (ZYRTEC) 10 MG tablet Take 1 tablet (10 mg total) by mouth daily. 30 tablet 11  . diclofenac sodium (VOLTAREN) 1 % GEL Apply 2 g topically 4 (four) times daily. Rub into affected area of foot 2 to 4 times daily 100 g 2  . docusate sodium (COLACE) 100 MG capsule Take 1 capsule (100 mg total) by mouth 3 (three) times daily as needed. 20 capsule 0  . fluticasone (FLONASE) 50 MCG/ACT nasal spray Place 2 sprays into both nostrils daily. 16 g 0  . LYRICA 150 MG capsule TAKE 1 CAPSULE BY MOUTH TWICE DAILY NEEDS VISIT FOR MORE REFILL 60 capsule 2  . Omega 3-6-9 Fatty Acids (TRIPLE OMEGA-3-6-9) CAPS Take 1 capsule by mouth daily.    . pantoprazole (PROTONIX) 20 MG tablet TAKE 1 TABLET BY MOUTH DAILY. 30 tablet PRN  . traMADol (ULTRAM) 50 MG tablet Take 1 tablet (50 mg total) by mouth every 6 (six) hours as needed. 15 tablet 0  . triamcinolone (NASACORT AQ) 55 MCG/ACT AERO nasal inhaler Place 2 sprays into the nose daily. 1 Inhaler 12  . UNIFINE PENTIPS 31G X 5 MM MISC USE FOR INSULIN INJECTION 3 TIMES PER DAY 100 each 8   No current facility-administered medications on file prior to visit.     Allergies  Allergen Reactions  . Sulfa Antibiotics Hives and Rash  . Citrus Itching  . Invokana [Canagliflozin] Other (See Comments)    Caused frequent vaginal yeast infections  . Codeine Hives  . Metformin And Related Diarrhea and Nausea And Vomiting  . Metoclopramide Hcl Nausea And  Vomiting  . Penicillins Nausea And Vomiting  . Propoxyphene Nausea And Vomiting    GI upset  . Propoxyphene N-Acetaminophen Nausea And Vomiting  . Sulfonamide Derivatives Hives    Family History  Problem Relation Age of Onset  . Breast cancer Mother   . Diabetes Mother   . Ovarian cancer Maternal Grandmother   . Heart disease Maternal Grandmother  great  . Stomach cancer Cousin   . Diabetes Brother        x 3   BP 132/82 (BP Location: Right Arm, Patient Position: Sitting, Cuff Size: Large)   Pulse 83   Temp 98.2 F (36.8 C) (Oral)   Wt 263 lb 3.2 oz (119.4 kg)   SpO2 94%   BMI 43.80 kg/m   Review of Systems She denies hypoglycemia.      Objective:   Physical Exam VITAL SIGNS:  See vs page GENERAL: no distress Pulses: dorsalis pedis intact bilat.   MSK: no deformity of the feet CV: trace bilat leg edema Skin:  no ulcer on the feet.  normal color and temp on the feet. Neuro: sensation is intact to touch on the feet, but decreased from normal.  Ext: There is bilateral onychomycosis of the toenails   Lab Results  Component Value Date   HGBA1C 8.1 (A) 12/31/2018       Assessment & Plan:  Insulin-requiring type 2 DM, with PN: worse.   Occupational status: in this setting, she needs to take humalog with her largest meal.   Edema: This limits rx options  Patient Instructions  Please change the insulins to the numbers listed below. On this type of insulin schedule, you should eat meals on a regular schedule.  If a meal is missed or significantly delayed, your blood sugar could go low.   Please continue the same bromocriptine.    check your blood sugar once a day.  vary the time of day when you check, between before the 3 meals, and at bedtime.  also check if you have symptoms of your blood sugar being too high or too low.  please keep a record of the readings and bring it to your next appointment here (or you can bring the meter itself).  You can write it on any  piece of paper.  please call us sooner if your blood sugar goes below 70, or if you have a lot of readings over 200.  With the [place], if you miss it, you can still take the full amount 12 hours later.   Please come back for a follow-up appointment in 2 months.

## 2018-12-31 NOTE — Telephone Encounter (Signed)
Control database checked last refill: 09/26/2018 LOV: 09/07/2018 [id number]

## 2019-01-01 MED FILL — UNIFINE PENTIPS 31GX3/16": 31G X 5 MM | 30 days supply | Qty: 100 | Fill #0

## 2019-01-01 MED FILL — UNIFINE PENTIPS 31GX3/16: 31G X 5 MM | 30 days supply | Qty: 100 | Fill #0

## 2019-01-02 ENCOUNTER — Encounter: Payer: Self-pay | Admitting: Internal Medicine

## 2019-01-02 ENCOUNTER — Ambulatory Visit (INDEPENDENT_AMBULATORY_CARE_PROVIDER_SITE_OTHER): Payer: 59 | Admitting: Internal Medicine

## 2019-01-02 DIAGNOSIS — E1165 Type 2 diabetes mellitus with hyperglycemia: Secondary | ICD-10-CM

## 2019-01-02 DIAGNOSIS — E785 Hyperlipidemia, unspecified: Secondary | ICD-10-CM | POA: Diagnosis not present

## 2019-01-02 DIAGNOSIS — E118 Type 2 diabetes mellitus with unspecified complications: Principal | ICD-10-CM

## 2019-01-02 DIAGNOSIS — IMO0002 Reserved for concepts with insufficient information to code with codable children: Secondary | ICD-10-CM

## 2019-01-02 DIAGNOSIS — E1140 Type 2 diabetes mellitus with diabetic neuropathy, unspecified: Secondary | ICD-10-CM | POA: Diagnosis not present

## 2019-01-02 MED ORDER — PREGABALIN 150 MG PO CAPS: 150.0000 mg | ORAL_CAPSULE | Freq: Two times a day (BID) | ORAL | 2 refills | Status: DC

## 2019-01-02 NOTE — Assessment & Plan Note (Signed)
Taking lyrica 150 mg intermittently and a 3 month supply has lasted 9 months. She does not feel able financially to afford lower dose lyrica to take while working and does not feel able to seek other employment at this time.

## 2019-01-02 NOTE — Assessment & Plan Note (Signed)
Has stopped taking her statin due to perception of side effects. We talked about how this is indicated and she does not want to pursue today.

## 2019-01-02 NOTE — Progress Notes (Signed)
Virtual Visit via Video Note  I connected with Bridget [name] on 01/02/19 at  9:20 AM EDT by a video enabled telemedicine application and verified that I am speaking with the correct person using two identifiers.  The patient and the provider were at separate locations throughout the entire encounter.   I discussed the limitations of evaluation and management by telemedicine and the availability of in person appointments. The patient expressed understanding and agreed to proceed.  History of Present Illness: The patient is a 58 y.o. female with visit for follow up of her neuropathy. Started years ago and uses lyrica intermittently for pain. This medication works well at 150 mg dosage but makes her drowsy. She cannot take it when working as she works 3rd shift. Her job has moved to 12 hour shifts now which is more difficult for her as she is not able to take lyrica then. She had tried taking lyrica 50 mg before shifts but insurance would not pay for two strengths and she did not want to buy although it did help some. She last had refill of lyrica for 3 month supply about 9 months ago. Has consistent pain and struggling at this time. We have previously discussed trying to find different employment where she is not in so much pain and can take the medicines that help her but she has not pursued this. Denies worsening pain. Her sugars are up somewhat due to scarcity at the grocery stores so she is buying more processed foods. Overall it is stable. Has tried lyrica and gabapentin in the past.   Dr. Stefano Gaul gyn mammogram 09/21/2018  Observations/Objective: Appearance: normal, breathing appears normal, casual grooming, abdomen does not appear distended, throat normal, memory normal, mental status is A and O times 3, EOM intact  Assessment and Plan: See problem oriented charting  Follow Up Instructions: Refill lyrica 150 mg BID today  I discussed the assessment and treatment plan with the patient. The  patient was provided an opportunity to ask questions and all were answered. The patient agreed with the plan and demonstrated an understanding of the instructions.   The patient was advised to call back or seek an in-person evaluation if the symptoms worsen or if the condition fails to improve as anticipated.  Bridget Broker, MD

## 2019-01-03 ENCOUNTER — Other Ambulatory Visit: Payer: Self-pay

## 2019-01-03 ENCOUNTER — Telehealth: Payer: Self-pay | Admitting: Internal Medicine

## 2019-01-03 MED ORDER — PREGABALIN 150 MG PO CAPS: 150.0000 mg | ORAL_CAPSULE | Freq: Two times a day (BID) | ORAL | 2 refills | Status: DC

## 2019-01-03 NOTE — Telephone Encounter (Signed)
Copied from CRM [id number]. Topic: General - Other >> Jan 03, 2019  7:51 AM Leafy Ro wrote: Reason for CRM:pt is calling and the rx lyrica needs to go to [facility] [facility]. [facility] is closed

## 2019-01-03 NOTE — Progress Notes (Signed)
Script needed to be sent to [facility]

## 2019-01-03 NOTE — Telephone Encounter (Signed)
Printed out script for Dr. Okey Dupre to sign tomorrow to be faxed to [facility] for patient.

## 2019-01-03 NOTE — Progress Notes (Signed)
Error. Unable to fax script/printed out

## 2019-01-04 MED ORDER — PREGABALIN 150 MG PO CAPS: 150.0000 mg | ORAL_CAPSULE | Freq: Two times a day (BID) | ORAL | 1 refills | Status: DC

## 2019-01-08 MED FILL — PREGABALIN 150 MG CAPS: 150 | 30 days supply | Qty: 60 | Fill #0

## 2019-01-10 MED FILL — ROCKLATAN 0.02-0.005 % SOLN: 0.02-0.005 | 25 days supply | Qty: 3 | Fill #1

## 2019-01-16 MED FILL — BROMOCRIPTINE 2.5 MG TABLET: 2.5 | 30 days supply | Qty: 15 | Fill #1

## 2019-01-17 MED FILL — TRESIBA FLEXTOUCH 100 UNITS: 100 | 25 days supply | Qty: 18 | Fill #1

## 2019-02-25 MED FILL — PREGABALIN 150 MG CAPS: 150 | 30 days supply | Qty: 60 | Fill #0

## 2019-02-25 MED FILL — BROMOCRIPTINE 2.5 MG TABLET: 2.5 | 30 days supply | Qty: 15 | Fill #0

## 2019-03-04 ENCOUNTER — Ambulatory Visit: Payer: 59 | Admitting: Endocrinology

## 2019-03-04 MED FILL — MELOXICAM 15 MG TABLET: 15 | 30 days supply | Qty: 30 | Fill #0

## 2019-03-07 MED FILL — TRESIBA FLEXTOUCH 100 UNITS: 100 | 25 days supply | Qty: 18 | Fill #0

## 2019-03-07 MED FILL — UNIFINE PENTIPS 31GX3/16": 31G X 5 MM | 30 days supply | Qty: 100 | Fill #0

## 2019-03-07 MED FILL — UNIFINE PENTIPS 31GX3/16: 31G X 5 MM | 30 days supply | Qty: 100 | Fill #0

## 2019-03-12 ENCOUNTER — Other Ambulatory Visit: Payer: Self-pay

## 2019-03-13 ENCOUNTER — Ambulatory Visit: Payer: 59 | Admitting: Endocrinology

## 2019-03-13 ENCOUNTER — Encounter: Payer: Self-pay | Admitting: Endocrinology

## 2019-03-13 VITALS — BP 120/78 | HR 88 | Ht 65.0 in | Wt 262.8 lb

## 2019-03-13 DIAGNOSIS — E1165 Type 2 diabetes mellitus with hyperglycemia: Secondary | ICD-10-CM

## 2019-03-13 DIAGNOSIS — IMO0002 Reserved for concepts with insufficient information to code with codable children: Secondary | ICD-10-CM

## 2019-03-13 DIAGNOSIS — E118 Type 2 diabetes mellitus with unspecified complications: Secondary | ICD-10-CM | POA: Diagnosis not present

## 2019-03-13 LAB — POCT GLYCOSYLATED HEMOGLOBIN (HGB A1C): Hemoglobin A1C: 9 % — AB (ref 4.0–5.6)

## 2019-03-13 MED ORDER — TRESIBA FLEXTOUCH 100 UNIT/ML ~~LOC~~ SOPN: 80.0000 [IU] | PEN_INJECTOR | Freq: Every day | SUBCUTANEOUS | 3 refills | Status: DC

## 2019-03-13 NOTE — Progress Notes (Signed)
Subjective:    Patient ID: Bridget [name], female    DOB: 11/29/1960, 58 y.o.   MRN: [id number]  HPI Pt returns for f/u of diabetes mellitus: DM type: Insulin-requiring type 2.  Dx'ed: 8127 Complications: polyneuropathy.   Therapy: insulin + bromocriptine.    GDM: never.  DKA: never.   Severe hypoglycemia: never.   Pancreatitis: never.  Other: she did not tolerate invokana (vaginitis) or metformin-XR (diarrhea); edema precludes pioglitizone rx.  she also took insulin from 2000-2003; she resumed in 2017, She declines multiple daily injections; she works 3rd shift.  Interval history: Pt says she sometimes misses the insulin, due to being busy at work.  she brings a record of her cbg's which I have reviewed today.  cbg varies from 110-260.  It is in general lowest at HS.  Past Medical History:  Diagnosis Date  . Bacterial vaginosis 12/07/2010  . Depression   . Diabetes mellitus without complication ([place])   . Diverticulosis   . DIVERTICULOSIS, COLON 10/26/2006  . Fibromyalgia   . GERD (gastroesophageal reflux disease)   . Glaucoma   . Headache   . HEEL SPUR 09/10/2007   Qualifier: Diagnosis of  By: Drue Flirt  MD, Merrily Brittle    . Hypertension   . MALAISE AND FATIGUE 05/27/2010  . Obesity   . OBESITY, NOS 10/26/2006  . Osteoarthritis   . OSTEOARTHRITIS, KNEE 04/27/2010  . Retained tampon 05/21/2012  . SHOULDER PAIN, LEFT 05/19/2010   Qualifier: Diagnosis of  By: Annamary Carolin MD, Amber    . Vaginal yeast infection 12/08/2010    Past Surgical History:  Procedure Laterality Date  . ABDOMINAL HYSTERECTOMY  1990  . ABDOMINAL HYSTERECTOMY    . BLADDER SURGERY  2001  . CATARACT EXTRACTION Right 2001   with implant  . EYE SURGERY     implant rt eye  . ROTATOR CUFF REPAIR Right   . SHOULDER ARTHROSCOPY WITH SUBACROMIAL DECOMPRESSION Left 07/20/2015   Procedure: SHOULDER ARTHROSCOPY WITH DEBRIDEMENT OF ROTATOR CUFF AND SUBACROMIAL DECOMPRESSION ;  Surgeon: Tania Ade, MD;  Location: [facility];  Service: Orthopedics;  Laterality: Left;  Left shoulder arthroscopy with debridement of rotator cuff and subacromial decompression    Social History   Socioeconomic History  . Marital status: Divorced    Spouse name: Not on file  . Number of children: 2  . Years of education: Not on file  . Highest education level: Not on file  Occupational History  . Occupation: Radio producer: [employer]  . Financial resource strain: Not on file  . Food insecurity    Worry: Not on file    Inability: Not on file  . Transportation needs    Medical: Not on file    Non-medical: Not on file  Tobacco Use  . Smoking status: Former Smoker    Packs/day: 0.30    Types: Cigarettes    Quit date: 08/29/1998    Years since quitting: 20.5  . Smokeless tobacco: Never Used  . Tobacco comment: Quit in 2000   Substance and Sexual Activity  . Alcohol use: Yes    Comment: socially  . Drug use: No  . Sexual activity: Yes    Birth control/protection: Surgical  Lifestyle  . Physical activity    Days per week: Not on file    Minutes per session: Not on file  . Stress: Not on file  Relationships  . Social Herbalist on  phone: Not on file    Gets together: Not on file    Attends religious service: Not on file    Active member of club or organization: Not on file    Attends meetings of clubs or organizations: Not on file    Relationship status: Not on file  . Intimate partner violence    Fear of current or ex partner: Not on file    Emotionally abused: Not on file    Physically abused: Not on file    Forced sexual activity: Not on file  Other Topics Concern  . Not on file  Social History Narrative   Works at [employer] as a Museum/gallery conservator    Current Outpatient Medications on File Prior to Visit  Medication Sig Dispense Refill  . ACCU-CHEK FASTCLIX LANCETS MISC USE TO CHECK BLOOD SUGAR 2 TIMES PER DAY. 204 each 2  . ACCU-CHEK GUIDE test strip  USE TO CHECK BLOOD SUGAR 2 TIMES PER DAY. 200 each 2  . aspirin 81 MG tablet Take 1 tablet (81 mg total) by mouth daily.    . Blood Glucose Monitoring Suppl (ACCU-CHEK GUIDE) w/Device KIT 2 each by Does not apply route daily. 1 kit 2  . cetirizine (ZYRTEC) 10 MG tablet Take 1 tablet (10 mg total) by mouth daily. 30 tablet 11  . diclofenac sodium (VOLTAREN) 1 % GEL Apply 2 g topically 4 (four) times daily. Rub into affected area of foot 2 to 4 times daily 100 g 2  . docusate sodium (COLACE) 100 MG capsule Take 1 capsule (100 mg total) by mouth 3 (three) times daily as needed. 20 capsule 0  . fluticasone (FLONASE) 50 MCG/ACT nasal spray Place 2 sprays into both nostrils daily. 16 g 0  . lisinopril (ZESTRIL) 5 MG tablet TAKE 1 TABLET BY MOUTH DAILY. 90 tablet 1  . Omega 3-6-9 Fatty Acids (TRIPLE OMEGA-3-6-9) CAPS Take 1 capsule by mouth daily.    . pantoprazole (PROTONIX) 20 MG tablet TAKE 1 TABLET BY MOUTH DAILY. 30 tablet PRN  . pregabalin (LYRICA) 150 MG capsule Take 1 capsule (150 mg total) by mouth 2 (two) times daily. 60 capsule 1  . traMADol (ULTRAM) 50 MG tablet Take 1 tablet (50 mg total) by mouth every 6 (six) hours as needed. 15 tablet 0  . triamcinolone (NASACORT AQ) 55 MCG/ACT AERO nasal inhaler Place 2 sprays into the nose daily. 1 Inhaler 12  . UNIFINE PENTIPS 31G X 5 MM MISC USE FOR INSULIN INJECTION 3 TIMES PER DAY 100 each 8   No current facility-administered medications on file prior to visit.     Allergies  Allergen Reactions  . Sulfa Antibiotics Hives and Rash  . Citrus Itching  . Invokana [Canagliflozin] Other (See Comments)    Caused frequent vaginal yeast infections  . Codeine Hives  . Metformin And Related Diarrhea and Nausea And Vomiting  . Metoclopramide Hcl Nausea And Vomiting  . Penicillins Nausea And Vomiting  . Propoxyphene Nausea And Vomiting    GI upset  . Propoxyphene N-Acetaminophen Nausea And Vomiting  . Sulfonamide Derivatives Hives    Family  History  Problem Relation Age of Onset  . Breast cancer Mother   . Diabetes Mother   . Ovarian cancer Maternal Grandmother   . Heart disease Maternal Grandmother        great  . Stomach cancer Cousin   . Diabetes Brother        x 3    BP 120/78 (BP Location:  Left Arm, Patient Position: Sitting, Cuff Size: Large)   Pulse 88   Ht 5' 5" (1.651 m)   Wt 262 lb 12.8 oz (119.2 kg)   SpO2 94%   BMI 43.73 kg/m    Review of Systems She denies hypoglycemia.      Objective:   Physical Exam VITAL SIGNS:  See vs page GENERAL: no distress Pulses: dorsalis pedis intact bilat.   MSK: no deformity of the feet CV: 1+ bilat leg edema Skin:  no ulcer on the feet.  normal color and temp on the feet. Neuro: sensation is intact to touch on the feet Ext: there is bilateral onychomycosis of the toenails.    Lab Results  Component Value Date   HGBA1C 9.0 (A) 03/13/2019       Assessment & Plan:  Insulin-requiring type 2 DM, with DN: worse Noncompliance with insulin: plan is to reduce number of meds.   Edema: This limits rx options  Patient Instructions  Please stop taking the novolog, and increase the tresiba to 80 units daily.  It does not have to be reffigerated.  On this type of insulin schedule, you should eat meals on a regular schedule.  If a meal is missed or significantly delayed, your blood sugar could go low.   Also, please stop taking the bromocriptine.    check your blood sugar once a day.  vary the time of day when you check, between before the 3 meals, and at bedtime.  also check if you have symptoms of your blood sugar being too high or too low.  please keep a record of the readings and bring it to your next appointment here (or you can bring the meter itself).  You can write it on any piece of paper.  please call us sooner if your blood sugar goes below 70, or if you have a lot of readings over 200.  With the [place], if you miss it, you can still take the full amount when you  are able.   Please come back for a follow-up appointment in 2 months.

## 2019-03-13 NOTE — Patient Instructions (Addendum)
Please stop taking the novolog, and increase the tresiba to 80 units daily.  It does not have to be reffigerated.  On this type of insulin schedule, you should eat meals on a regular schedule.  If a meal is missed or significantly delayed, your blood sugar could go low.   Also, please stop taking the bromocriptine.    check your blood sugar once a day.  vary the time of day when you check, between before the 3 meals, and at bedtime.  also check if you have symptoms of your blood sugar being too high or too low.  please keep a record of the readings and bring it to your next appointment here (or you can bring the meter itself).  You can write it on any piece of paper.  please call us sooner if your blood sugar goes below 70, or if you have a lot of readings over 200.  With the [place], if you miss it, you can still take the full amount when you are able.   Please come back for a follow-up appointment in 2 months.

## 2019-04-01 ENCOUNTER — Other Ambulatory Visit: Payer: Self-pay | Admitting: Internal Medicine

## 2019-04-01 MED FILL — PREGABALIN 150 MG CAPS: 150 | 30 days supply | Qty: 60 | Fill #0

## 2019-04-01 MED FILL — MELOXICAM 15 MG TABLET: 15 | 30 days supply | Qty: 30 | Fill #1

## 2019-04-01 MED FILL — TRESIBA FLEXTOUCH 100 UNITS: 100 | 75 days supply | Qty: 60 | Fill #0

## 2019-04-01 NOTE — Telephone Encounter (Signed)
Control database checked last refill: 02/25/2019 60 tabs LOV: 01/02/2019 NOV: none

## 2019-04-08 MED FILL — PANTOPRAZOLE SOD DR 20 MG T: 20 | 90 days supply | Qty: 90 | Fill #0

## 2019-04-29 MED FILL — LISINOPRIL 5 MG TABLET: 5 | 90 days supply | Qty: 90 | Fill #0

## 2019-04-29 MED FILL — UNIFINE PENTIPS 31GX3/16: 31G X 5 MM | 30 days supply | Qty: 100 | Fill #1

## 2019-04-29 MED FILL — UNIFINE PENTIPS 31GX3/16": 31G X 5 MM | 30 days supply | Qty: 100 | Fill #1

## 2019-04-30 MED FILL — MELOXICAM 15 MG TABLET: 15 | 30 days supply | Qty: 30 | Fill #2

## 2019-05-02 ENCOUNTER — Other Ambulatory Visit: Payer: Self-pay | Admitting: Endocrinology

## 2019-05-03 MED FILL — ACCU-CHEK GUIDE TEST STRIP: 90 days supply | Qty: 200 | Fill #0

## 2019-05-13 MED FILL — HUMALOG 100 UNITS/ML KWIKPE: 100 | 90 days supply | Qty: 15 | Fill #2

## 2019-05-17 ENCOUNTER — Other Ambulatory Visit: Payer: Self-pay

## 2019-05-17 ENCOUNTER — Encounter: Payer: Self-pay | Admitting: Endocrinology

## 2019-05-17 ENCOUNTER — Ambulatory Visit: Payer: 59 | Admitting: Endocrinology

## 2019-05-17 VITALS — BP 132/78 | HR 94 | Ht 65.0 in | Wt 264.0 lb

## 2019-05-17 DIAGNOSIS — E1165 Type 2 diabetes mellitus with hyperglycemia: Secondary | ICD-10-CM

## 2019-05-17 DIAGNOSIS — IMO0002 Reserved for concepts with insufficient information to code with codable children: Secondary | ICD-10-CM

## 2019-05-17 DIAGNOSIS — E118 Type 2 diabetes mellitus with unspecified complications: Secondary | ICD-10-CM | POA: Diagnosis not present

## 2019-05-17 LAB — POCT GLYCOSYLATED HEMOGLOBIN (HGB A1C): Hemoglobin A1C: 7.8 % — AB (ref 4.0–5.6)

## 2019-05-17 MED ORDER — INSULIN LISPRO (1 UNIT DIAL) 100 UNIT/ML (KWIKPEN): 10.0000 [IU] | PEN_INJECTOR | Freq: Three times a day (TID) | SUBCUTANEOUS | 11 refills | Status: DC

## 2019-05-17 NOTE — Patient Instructions (Addendum)
Please continue the same insulins check your blood sugar twice a day.  vary the time of day when you check, between before the 3 meals, and at bedtime.  also check if you have symptoms of your blood sugar being too high or too low.  please keep a record of the readings and bring it to your next appointment here (or you can bring the meter itself).  You can write it on any piece of paper.  please call us sooner if your blood sugar goes below 70, or if you have a lot of readings over 200.   Please come back for a follow-up appointment in 3-4 months.  

## 2019-05-17 NOTE — Progress Notes (Signed)
Subjective:    Patient ID: Bridget [name], female    DOB: 1961/04/14, 58 y.o.   MRN: [id number]  HPI Pt returns for f/u of diabetes mellitus: DM type: Insulin-requiring type 2.  Dx'ed: 9169 Complications: polyneuropathy.   Therapy: insulin + bromocriptine.    GDM: never.  DKA: never.   Severe hypoglycemia: never.   Pancreatitis: never.  Other: she did not tolerate invokana (vaginitis) or metformin-XR (diarrhea); edema precludes pioglitizone rx.  she also took insulin from 2000-2003; she resumed in 2017, She declines multiple daily , but basal insulin is emphasized, with improved results; she works 3rd shift.  Interval history: She takes tresiba 80/d, and humalog 10 units 3 times a day (just before each meal).  Pt says she does not miss the insulin.  she brings a record of her cbg's which I have reviewed today.  cbg's vary from 68-170.  There is no trend throughout the day.   Past Medical History:  Diagnosis Date  . Bacterial vaginosis 12/07/2010  . Depression   . Diabetes mellitus without complication ([place])   . Diverticulosis   . DIVERTICULOSIS, COLON 10/26/2006  . Fibromyalgia   . GERD (gastroesophageal reflux disease)   . Glaucoma   . Headache   . HEEL SPUR 09/10/2007   Qualifier: Diagnosis of  By: Drue Flirt  MD, Merrily Brittle    . Hypertension   . MALAISE AND FATIGUE 05/27/2010  . Obesity   . OBESITY, NOS 10/26/2006  . Osteoarthritis   . OSTEOARTHRITIS, KNEE 04/27/2010  . Retained tampon 05/21/2012  . SHOULDER PAIN, LEFT 05/19/2010   Qualifier: Diagnosis of  By: Annamary Carolin MD, Amber    . Vaginal yeast infection 12/08/2010    Past Surgical History:  Procedure Laterality Date  . ABDOMINAL HYSTERECTOMY  1990  . ABDOMINAL HYSTERECTOMY    . BLADDER SURGERY  2001  . CATARACT EXTRACTION Right 2001   with implant  . EYE SURGERY     implant rt eye  . ROTATOR CUFF REPAIR Right   . SHOULDER ARTHROSCOPY WITH SUBACROMIAL DECOMPRESSION Left 07/20/2015   Procedure: SHOULDER ARTHROSCOPY WITH  DEBRIDEMENT OF ROTATOR CUFF AND SUBACROMIAL DECOMPRESSION ;  Surgeon: Tania Ade, MD;  Location: [facility];  Service: Orthopedics;  Laterality: Left;  Left shoulder arthroscopy with debridement of rotator cuff and subacromial decompression    Social History   Socioeconomic History  . Marital status: Divorced    Spouse name: Not on file  . Number of children: 2  . Years of education: Not on file  . Highest education level: Not on file  Occupational History  . Occupation: Radio producer: [place]  . Financial resource strain: Not on file  . Food insecurity    Worry: Not on file    Inability: Not on file  . Transportation needs    Medical: Not on file    Non-medical: Not on file  Tobacco Use  . Smoking status: Former Smoker    Packs/day: 0.30    Types: Cigarettes    Quit date: 08/29/1998    Years since quitting: 20.7  . Smokeless tobacco: Never Used  . Tobacco comment: Quit in 2000   Substance and Sexual Activity  . Alcohol use: Yes    Comment: socially  . Drug use: No  . Sexual activity: Yes    Birth control/protection: Surgical  Lifestyle  . Physical activity    Days per week: Not on file    Minutes per  session: Not on file  . Stress: Not on file  Relationships  . Social Herbalist on phone: Not on file    Gets together: Not on file    Attends religious service: Not on file    Active member of club or organization: Not on file    Attends meetings of clubs or organizations: Not on file    Relationship status: Not on file  . Intimate partner violence    Fear of current or ex partner: Not on file    Emotionally abused: Not on file    Physically abused: Not on file    Forced sexual activity: Not on file  Other Topics Concern  . Not on file  Social History Narrative   Works at [employer] as a Museum/gallery conservator    Current Outpatient Medications on File Prior to Visit  Medication Sig Dispense Refill  .  ACCU-CHEK FASTCLIX LANCETS MISC USE TO CHECK BLOOD SUGAR 2 TIMES PER DAY. 204 each 2  . ACCU-CHEK GUIDE test strip USE TO CHECK BLOOD SUGAR 2 TIMES PER DAY. 200 strip 2  . aspirin 81 MG tablet Take 1 tablet (81 mg total) by mouth daily.    . Blood Glucose Monitoring Suppl (ACCU-CHEK GUIDE) w/Device KIT 2 each by Does not apply route daily. 1 kit 2  . cetirizine (ZYRTEC) 10 MG tablet Take 1 tablet (10 mg total) by mouth daily. 30 tablet 11  . diclofenac sodium (VOLTAREN) 1 % GEL Apply 2 g topically 4 (four) times daily. Rub into affected area of foot 2 to 4 times daily 100 g 2  . docusate sodium (COLACE) 100 MG capsule Take 1 capsule (100 mg total) by mouth 3 (three) times daily as needed. 20 capsule 0  . fluticasone (FLONASE) 50 MCG/ACT nasal spray Place 2 sprays into both nostrils daily. 16 g 0  . insulin degludec (TRESIBA FLEXTOUCH) 100 UNIT/ML SOPN FlexTouch Pen Inject 0.8 mLs (80 Units total) into the skin daily. 20 pen 3  . lisinopril (ZESTRIL) 5 MG tablet TAKE 1 TABLET BY MOUTH DAILY. 90 tablet 1  . Omega 3-6-9 Fatty Acids (TRIPLE OMEGA-3-6-9) CAPS Take 1 capsule by mouth daily.    . pantoprazole (PROTONIX) 20 MG tablet TAKE 1 TABLET BY MOUTH DAILY. 30 tablet PRN  . pregabalin (LYRICA) 150 MG capsule TAKE 1 CAPSULE (150 MG TOTAL) BY MOUTH 2 (TWO) TIMES DAILY. 60 capsule 0  . traMADol (ULTRAM) 50 MG tablet Take 1 tablet (50 mg total) by mouth every 6 (six) hours as needed. 15 tablet 0  . triamcinolone (NASACORT AQ) 55 MCG/ACT AERO nasal inhaler Place 2 sprays into the nose daily. 1 Inhaler 12  . UNIFINE PENTIPS 31G X 5 MM MISC USE FOR INSULIN INJECTION 3 TIMES PER DAY 100 each 8   No current facility-administered medications on file prior to visit.     Allergies  Allergen Reactions  . Sulfa Antibiotics Hives and Rash  . Citrus Itching  . Invokana [Canagliflozin] Other (See Comments)    Caused frequent vaginal yeast infections  . Codeine Hives  . Metformin And Related Diarrhea and  Nausea And Vomiting  . Metoclopramide Hcl Nausea And Vomiting  . Penicillins Nausea And Vomiting  . Propoxyphene Nausea And Vomiting    GI upset  . Propoxyphene N-Acetaminophen Nausea And Vomiting  . Sulfonamide Derivatives Hives    Family History  Problem Relation Age of Onset  . Breast cancer Mother   . Diabetes Mother   .  Ovarian cancer Maternal Grandmother   . Heart disease Maternal Grandmother        great  . Stomach cancer Cousin   . Diabetes Brother        x 3    BP 132/78 (BP Location: Left Arm, Patient Position: Sitting, Cuff Size: Large)   Pulse 94   Ht '5\' 5"'  (1.651 m)   Wt 264 lb (119.7 kg)   SpO2 95%   BMI 43.93 kg/m   Review of Systems Denies LOC.      Objective:   Physical Exam VITAL SIGNS:  See vs page GENERAL: no distress Pulses: dorsalis pedis intact bilat.   MSK: no deformity of the feet CV: no leg edema Skin:  no ulcer on the feet.  normal color and temp on the feet. Neuro: sensation is intact to touch on the feet  A1c=7.8%     Assessment & Plan:  Insulin-requiring type 2 DM, with PN: this is the best control this pt should aim for, given variable cbg's.   Hypoglycemia: this limits aggressiveness of glycemic control.    Patient Instructions  Please continue the same insulins.   check your blood sugar twice a day.  vary the time of day when you check, between before the 3 meals, and at bedtime.  also check if you have symptoms of your blood sugar being too high or too low.  please keep a record of the readings and bring it to your next appointment here (or you can bring the meter itself).  You can write it on any piece of paper.  please call us sooner if your blood sugar goes below 70, or if you have a lot of readings over 200. Please come back for a follow-up appointment in 3-4 months.

## 2019-06-20 ENCOUNTER — Other Ambulatory Visit: Payer: Self-pay | Admitting: Internal Medicine

## 2019-06-20 MED FILL — PANTOPRAZOLE SOD DR 20 MG T: 20 | 90 days supply | Qty: 90 | Fill #1

## 2019-06-20 NOTE — Telephone Encounter (Signed)
Needs follow up of her cholesterol as she was not willing to make changes in January this should be monitored again.

## 2019-06-20 NOTE — Telephone Encounter (Signed)
Control database checked last refill:04/01/2019 60 tabs LOV: 01/02/2019, 09/07/2018 pain visit NOV: none

## 2019-06-24 ENCOUNTER — Other Ambulatory Visit: Payer: Self-pay | Admitting: Internal Medicine

## 2019-06-24 NOTE — Telephone Encounter (Signed)
Control database checked last refill: 04/01/2019 60 tabs LOV:01/02/2019 LEX:NTZG

## 2019-06-24 NOTE — Telephone Encounter (Signed)
Please see prior encounter where this was declined. She declined to take care of her cholesterol so we asked her to follow up with that and she is overdue. Needs visit for this refill.

## 2019-06-26 ENCOUNTER — Other Ambulatory Visit: Payer: Self-pay | Admitting: Internal Medicine

## 2019-06-26 MED FILL — TRESIBA FLEXTOUCH 100 UNITS: 100 | 25 days supply | Qty: 18 | Fill #1

## 2019-07-12 DIAGNOSIS — H401121 Primary open-angle glaucoma, left eye, mild stage: Secondary | ICD-10-CM | POA: Diagnosis not present

## 2019-07-12 DIAGNOSIS — H401112 Primary open-angle glaucoma, right eye, moderate stage: Secondary | ICD-10-CM | POA: Diagnosis not present

## 2019-07-12 DIAGNOSIS — E119 Type 2 diabetes mellitus without complications: Secondary | ICD-10-CM | POA: Diagnosis not present

## 2019-07-12 LAB — HM DIABETES EYE EXAM

## 2019-07-15 MED FILL — TRESIBA FLEXTOUCH 100 UNITS: 100 | 75 days supply | Qty: 60 | Fill #1

## 2019-07-15 MED FILL — UNIFINE PENTIPS 31GX3/16: 31G X 5 MM | 30 days supply | Qty: 100 | Fill #2

## 2019-07-15 MED FILL — PANTOPRAZOLE SOD DR 20 MG T: 20 | 90 days supply | Qty: 90 | Fill #1

## 2019-07-15 MED FILL — UNIFINE PENTIPS 31GX3/16": 31G X 5 MM | 30 days supply | Qty: 100 | Fill #2

## 2019-07-16 MED FILL — ACCU-CHEK GUIDE STRP: 90 days supply | Qty: 200 | Fill #1

## 2019-08-02 MED FILL — COMBIGAN EYE DROPS: 0.2-0.5 | 37 days supply | Qty: 5 | Fill #0

## 2019-08-05 ENCOUNTER — Ambulatory Visit: Payer: 59 | Admitting: Internal Medicine

## 2019-08-05 MED FILL — LUMIGAN 0.01% EYE DROPS: 0.01 | 25 days supply | Qty: 3 | Fill #0

## 2019-08-06 ENCOUNTER — Encounter: Payer: Self-pay | Admitting: Internal Medicine

## 2019-08-06 ENCOUNTER — Ambulatory Visit (INDEPENDENT_AMBULATORY_CARE_PROVIDER_SITE_OTHER): Payer: 59 | Admitting: Internal Medicine

## 2019-08-06 DIAGNOSIS — U071 COVID-19: Secondary | ICD-10-CM | POA: Diagnosis not present

## 2019-08-06 MED ORDER — HYDROCODONE-HOMATROPINE 5-1.5 MG/5ML PO SYRP: 5.0000 mL | ORAL_SOLUTION | Freq: Two times a day (BID) | ORAL | 0 refills | Status: DC | PRN

## 2019-08-06 MED FILL — HYDROCODONE-HOMATROPINE SOL: 5-1.5 | 5 days supply | Qty: 100 | Fill #0

## 2019-08-06 NOTE — Progress Notes (Signed)
Virtual Visit via Video Note  I connected with Bridget [name] on 08/06/19 at  1:40 PM EST by a video enabled telemedicine application and verified that I am speaking with the correct person using two identifiers.  The patient and the provider were at separate locations throughout the entire encounter.   I discussed the limitations of evaluation and management by telemedicine and the availability of in person appointments. The patient expressed understanding and agreed to proceed. The patient and the provider were the only parties present for the visit unless noted in HPI below.  History of Present Illness: The patient is a 58 y.o. female with visit for postive covid-19 results. Started having symptoms about 1-2 weeks ago. Did not report symptoms but after having fever to 100.4 F last Friday called health at work. They had her tested on 08/03/19 and she was positive. She is having mostly sinus congestion and cough. Denies SOB. She is taking zyrtec and flonase which is not helping much. Cough is not productive. Has concurrent DM and hypertension. Overall it is not improving.   Observations/Objective: Appearance: appears ill, breathing appears normal, coughing non-productive during visit, casual grooming, abdomen does not appear distended, throat not visualized, memory normal, mental status is A and O times 3  Assessment and Plan: See problem oriented charting  Follow Up Instructions: rx hycodan, reach out about infusion given risk factors  Visit time 25 minutes: greater than 50% of that time was spent in face to face counseling and coordination of care with the patient: counseled about covid-19 illness, course of symptoms  I discussed the assessment and treatment plan with the patient. The patient was provided an opportunity to ask questions and all were answered. The patient agreed with the plan and demonstrated an understanding of the instructions.   The patient was advised to call back or seek  an in-person evaluation if the symptoms worsen or if the condition fails to improve as anticipated.  Hoyt Koch, MD

## 2019-08-06 NOTE — Assessment & Plan Note (Signed)
Positive test on 08/03/19. Rx hycodan for cough. Will reach out to see if she qualifies for infusion.

## 2019-08-09 ENCOUNTER — Telehealth: Payer: Self-pay | Admitting: Internal Medicine

## 2019-08-09 NOTE — Telephone Encounter (Signed)
Have you seen these forms? 

## 2019-08-09 NOTE — Telephone Encounter (Signed)
Patient is calling to ask did they receive paper for [employer]? And if so, what date was put on the paper work to return to work. Patient states she still has  A slight fever. Please advise Cb- [phone]

## 2019-08-12 NOTE — Telephone Encounter (Signed)
I have not received these forms. Yet I was informed our fax machine was down on Friday (12/11).

## 2019-08-12 NOTE — Telephone Encounter (Signed)
Patient informed the forms were not received will have them sent to side A and front fax. Patient states still having a fever and diarrhea wanting to know when she was going to be told to go back to work since she is still symptomatic

## 2019-08-16 ENCOUNTER — Ambulatory Visit: Payer: 59 | Admitting: Endocrinology

## 2019-08-19 ENCOUNTER — Telehealth: Payer: Self-pay

## 2019-08-19 NOTE — Telephone Encounter (Signed)
Then she can follow up.

## 2019-08-19 NOTE — Telephone Encounter (Signed)
Copied from [place] [id number]. Topic: General - Inquiry >> Aug 19, 2019 10:57 AM Br[name], NT wrote: Reason for CRM: Pt called in stating she would like to check with PCP when it would be okay for her to return back to work. Pt states she is needing to know her release date for health at work. Please advise.

## 2019-08-19 NOTE — Telephone Encounter (Signed)
Pt stated that she was tested on 12/6 stating that it has been 14 days. She also stated that all of her symptoms have went away but she does have a slight lingering cough. Please advise.

## 2019-08-19 NOTE — Telephone Encounter (Signed)
That would be up to health at work if she could go back with the lingering cough but I can only provide CDC guidelines.

## 2019-08-19 NOTE — Telephone Encounter (Signed)
Pt stated that Health at Work informed her that she would have to follow up with PCP due to her being on FMLA.

## 2019-08-19 NOTE — Telephone Encounter (Signed)
CDC recommends 14 days from positive test AND improvement in respiratory symptoms.

## 2019-08-19 NOTE — Telephone Encounter (Signed)
Pt has scheduled doxy for 12/22 to discuss.

## 2019-08-20 ENCOUNTER — Encounter: Payer: Self-pay | Admitting: Internal Medicine

## 2019-08-20 ENCOUNTER — Ambulatory Visit (INDEPENDENT_AMBULATORY_CARE_PROVIDER_SITE_OTHER): Payer: 59 | Admitting: Internal Medicine

## 2019-08-20 DIAGNOSIS — U071 COVID-19: Secondary | ICD-10-CM | POA: Diagnosis not present

## 2019-08-20 NOTE — Assessment & Plan Note (Signed)
Is cleared to return to work. Advised that cough can take some weeks to months to fully clear.

## 2019-08-20 NOTE — Progress Notes (Signed)
Virtual Visit via Video Note  I connected with Bridget [name] on 08/20/19 at  9:20 AM EST by a video enabled telemedicine application and verified that I am speaking with the correct person using two identifiers.  The patient and the provider were at separate locations throughout the entire encounter.   I discussed the limitations of evaluation and management by telemedicine and the availability of in person appointments. The patient expressed understanding and agreed to proceed. The patient and the provider were the only parties present for the visit unless noted in HPI below.  History of Present Illness: The patient is a 58 y.o. female with visit for concerns about returning to work. She did test positive for covid-19 on 08/03/19. Started feeling poorly about 3-4 days at least prior to testing. She has been unable to return to work as she has not been cleared. She was given instructions at last visit that she should be out at least 14 days since covid-19 test positive and improvement in respiratory symptoms. She has not returned to work since. She is still coughing a little bit. Has no fevers or chills. No SOB. Overall is feeling well. Overall it is improved significantly. Has tried cough medicine.  Observations/Objective: Appearance: normal, breathing appears normal, some non-productive coughing during visit, casual grooming, abdomen does not appear distended, throat normal, memory normal, mental status is A and O times 3  Assessment and Plan: See problem oriented charting  Follow Up Instructions: able to return to work as quarantine period has ended and overall symptoms are improved  I discussed the assessment and treatment plan with the patient. The patient was provided an opportunity to ask questions and all were answered. The patient agreed with the plan and demonstrated an understanding of the instructions.   The patient was advised to call back or seek an in-person evaluation if the  symptoms worsen or if the condition fails to improve as anticipated.  Hoyt Koch, MD

## 2019-08-21 MED FILL — COMBIGAN EYE DROPS: 0.2-0.5 | 30 days supply | Qty: 5 | Fill #0

## 2019-09-02 ENCOUNTER — Other Ambulatory Visit: Payer: Self-pay

## 2019-09-02 DIAGNOSIS — H401113 Primary open-angle glaucoma, right eye, severe stage: Secondary | ICD-10-CM | POA: Diagnosis not present

## 2019-09-02 DIAGNOSIS — H401121 Primary open-angle glaucoma, left eye, mild stage: Secondary | ICD-10-CM | POA: Diagnosis not present

## 2019-09-03 ENCOUNTER — Encounter (HOSPITAL_COMMUNITY): Payer: Self-pay | Admitting: Emergency Medicine

## 2019-09-03 ENCOUNTER — Ambulatory Visit: Payer: 59 | Admitting: Endocrinology

## 2019-09-03 ENCOUNTER — Other Ambulatory Visit: Payer: Self-pay

## 2019-09-03 ENCOUNTER — Emergency Department (HOSPITAL_COMMUNITY)
Admission: EM | Admit: 2019-09-03 | Discharge: 2019-09-03 | Disposition: A | Discharge status: Home / Self Care | Payer: 59 | Attending: Emergency Medicine | Admitting: Emergency Medicine

## 2019-09-03 DIAGNOSIS — M5441 Lumbago with sciatica, right side: Secondary | ICD-10-CM | POA: Insufficient documentation

## 2019-09-03 DIAGNOSIS — Z87891 Personal history of nicotine dependence: Secondary | ICD-10-CM | POA: Insufficient documentation

## 2019-09-03 DIAGNOSIS — E119 Type 2 diabetes mellitus without complications: Secondary | ICD-10-CM | POA: Insufficient documentation

## 2019-09-03 DIAGNOSIS — Z79899 Other long term (current) drug therapy: Secondary | ICD-10-CM | POA: Insufficient documentation

## 2019-09-03 DIAGNOSIS — Z794 Long term (current) use of insulin: Secondary | ICD-10-CM | POA: Diagnosis not present

## 2019-09-03 DIAGNOSIS — M545 Low back pain: Secondary | ICD-10-CM | POA: Diagnosis present

## 2019-09-03 DIAGNOSIS — I10 Essential (primary) hypertension: Secondary | ICD-10-CM | POA: Insufficient documentation

## 2019-09-03 DIAGNOSIS — Z7982 Long term (current) use of aspirin: Secondary | ICD-10-CM | POA: Insufficient documentation

## 2019-09-03 MED ORDER — KETOROLAC TROMETHAMINE 15 MG/ML IJ SOLN
15.0000 mg | Freq: Once | INTRAMUSCULAR | Status: AC
  Administered 2019-09-03: 15 mg via INTRAVENOUS
  Filled 2019-09-03: qty 1

## 2019-09-03 MED ORDER — METHOCARBAMOL 500 MG PO TABS: 500.0000 mg | ORAL_TABLET | Freq: Two times a day (BID) | ORAL | 0 refills | Status: DC

## 2019-09-03 MED ORDER — LIDOCAINE 5 % EX PTCH
1.0000 | MEDICATED_PATCH | CUTANEOUS | Status: DC
  Administered 2019-09-03: 1 via TRANSDERMAL
  Filled 2019-09-03: qty 1

## 2019-09-03 MED ORDER — METHOCARBAMOL 500 MG PO TABS
500.0000 mg | ORAL_TABLET | Freq: Once | ORAL | Status: AC
  Administered 2019-09-03: 500 mg via ORAL
  Filled 2019-09-03: qty 1

## 2019-09-03 MED ORDER — MELOXICAM 7.5 MG PO TABS: 7.5000 mg | ORAL_TABLET | Freq: Every day | ORAL | 0 refills | Status: DC

## 2019-09-03 MED ORDER — OXYCODONE-ACETAMINOPHEN 5-325 MG PO TABS
2.0000 | ORAL_TABLET | Freq: Once | ORAL | Status: AC
  Administered 2019-09-03: 2 via ORAL
  Filled 2019-09-03: qty 2
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
## 2019-09-03 NOTE — ED Triage Notes (Signed)
Pt endorses lower back pain for 4 days. All on right side and goes down leg. Covid + a month ago and denies any symptoms.

## 2019-09-03 NOTE — Discharge Instructions (Addendum)
You were seen here today for Back Pain: Low back pain is discomfort in the lower back that may be due to injuries to muscles and ligaments around the spine. Occasionally, it may be caused by a problem to a part of the spine called a disc. Your back pain should be treated with medicines listed below as well as back exercises and this back pain should get better over the next 2 weeks. Most patients get completely well in 4 weeks. It is important to know however, if you develop severe or worsening pain, low back pain with fever, numbness, weakness or inability to walk or urinate, you should return to the ER immediately.  Please follow up with your doctor this week for a recheck if still having symptoms.  HOME INSTRUCTIONS Self - care:  The application of heat can help soothe the pain.  Maintaining your daily activities, including walking (this is encouraged), as it will help you get better faster than just staying in bed. Do not life, push, pull anything more than 10 pounds for the next week. I am attaching back exercises that you can do at home to help facilitate your recovery.   Back Exercises - I have attached a handout on back exercises that can be done at home to help facilitate your recovery.   Medications are also useful to help with pain control.   Acetaminophen.  This medication is generally safe, and found over the counter. Take as directed for your age. You should not take more than 8 of the extra strength (500mg ) pills a day (max dose is 4000mg  total OVER one day)  Non steroidal anti inflammatory: This includes medications including Ibuprofen, naproxen and Mobic; These medications help both pain and swelling and are very useful in treating back pain.  They should be taken with food, as they can cause stomach upset, and more seriously, stomach bleeding. Do not combine the medications.   Lidocaine Patch: Salon Pas lidocaine patches (blue and silver box) can be purchased over the counter and worn  for 12 hours for local pain relief   Muscle relaxants:  These medications can help with muscle tightness that is a cause of lower back pain.  Most of these medications can cause drowsiness, and it is not safe to drive or use dangerous machinery while taking them. They are primarily helpful when taken at night before sleep.  You will need to follow up with your primary healthcare provider or the Orthopedist in 1-2 weeks for reassessment and persistent symptoms.  Be aware that if you develop new symptoms, such as a fever, leg weakness, difficulty with or loss of control of your urine or bowels, abdominal pain, or more severe pain, you will need to seek medical attention and/or return to the Emergency department. Additional Information:  Your vital signs today were: BP 127/74   Pulse 99   Temp 99.2 F (37.3 C) (Oral)   Resp 18   SpO2 98%  If your blood pressure (BP) was elevated above 135/85 this visit, please have this repeated by your doctor within one month. ---------------

## 2019-09-04 MED FILL — MELOXICAM 7.5 MG TABLET: 7.5 | 10 days supply | Qty: 10 | Fill #0

## 2019-09-04 MED FILL — METHOCARBAMOL 500 MG TABS: 500 | 10 days supply | Qty: 20 | Fill #0

## 2019-09-05 ENCOUNTER — Ambulatory Visit: Payer: 59 | Admitting: Internal Medicine

## 2019-09-09 ENCOUNTER — Other Ambulatory Visit: Payer: Self-pay

## 2019-09-09 ENCOUNTER — Ambulatory Visit (INDEPENDENT_AMBULATORY_CARE_PROVIDER_SITE_OTHER): Payer: 59 | Admitting: Internal Medicine

## 2019-09-09 ENCOUNTER — Encounter: Payer: Self-pay | Admitting: Internal Medicine

## 2019-09-09 VITALS — BP 136/84 | HR 73 | Temp 98.2°F | Ht 65.0 in | Wt 253.0 lb

## 2019-09-09 DIAGNOSIS — G8929 Other chronic pain: Secondary | ICD-10-CM | POA: Diagnosis not present

## 2019-09-09 DIAGNOSIS — M549 Dorsalgia, unspecified: Secondary | ICD-10-CM | POA: Diagnosis not present

## 2019-09-09 DIAGNOSIS — R52 Pain, unspecified: Secondary | ICD-10-CM

## 2019-09-09 MED ORDER — LISINOPRIL 5 MG PO TABS: 5.0000 mg | ORAL_TABLET | Freq: Every day | ORAL | 3 refills | Status: DC

## 2019-09-09 MED ORDER — MELOXICAM 15 MG PO TABS: 15.0000 mg | ORAL_TABLET | Freq: Every day | ORAL | 3 refills | Status: DC

## 2019-09-09 MED ORDER — KETOROLAC TROMETHAMINE 30 MG/ML IJ SOLN
30.0000 mg | Freq: Once | INTRAMUSCULAR | Status: AC
  Administered 2019-09-09: 10:00:00 30 mg via INTRAMUSCULAR

## 2019-09-09 MED FILL — LISINOPRIL 5 MG TABS: 5 | 90 days supply | Qty: 90 | Fill #0

## 2019-09-09 MED FILL — MELOXICAM 15 MG TABLET: 15 | 30 days supply | Qty: 30 | Fill #0

## 2019-09-09 NOTE — Patient Instructions (Signed)
Have them resend the [facility] papers and we will get those filled out.  We have given you a toradol shot today.

## 2019-09-09 NOTE — Progress Notes (Signed)
   Subjective:   Patient ID: [name], female    DOB: May 21, 1961, 59 y.o.   MRN: [id number]  HPI The patient is a 59 YO female coming in for ongoing care and FMLA recertification. Prior recommendation up to 4 days per month last certified 09/12/2018. This has typically worked well for her chronic pain and fibromyalgia. She states taking lyrica and running out early. Last filled 04/01/19 per controlled database and she has bottle with her which is from that date. This was a 30 day supply. She states she does take this twice a day and sometimes more.  New acute problem, has been out since 08/31/19 for acute back pain and went to ER 09/03/19. Needs different FMLA for that as this exceeded 4 days FMLA. Feels she can go back on the 18th. She denies injury. Has had problems with this in the past. She does have radiation of the pain down right leg. Denies loss of bowels or bladder or change in sensation in pelvic region. Weight is down about 10 pounds since last 6 months. Is improving slightly in the last 1-2 days. She is taking meloxicam but ER only gave her 7.5 mg and previously she had used 15 mg which helped more. Using heat and otc creams also which help some.   Review of Systems  Constitutional: Positive for activity change.  HENT: Negative.   Eyes: Negative.   Respiratory: Negative for cough, chest tightness and shortness of breath.   Cardiovascular: Negative for chest pain, palpitations and leg swelling.  Gastrointestinal: Negative for abdominal distention, abdominal pain, constipation, diarrhea, nausea and vomiting.  Musculoskeletal: Positive for arthralgias, back pain and myalgias.  Skin: Negative.   Neurological: Negative.   Psychiatric/Behavioral: Negative.     Objective:  Physical Exam Constitutional:      Appearance: She is well-developed. She is obese.  HENT:     Head: Normocephalic and atraumatic.  Cardiovascular:     Rate and Rhythm: Normal rate and regular rhythm.  Pulmonary:      Effort: Pulmonary effort is normal. No respiratory distress.     Breath sounds: Normal breath sounds. No wheezing or rales.  Abdominal:     General: Bowel sounds are normal. There is no distension.     Palpations: Abdomen is soft.     Tenderness: There is no abdominal tenderness. There is no rebound.  Musculoskeletal:        General: Tenderness present.     Cervical back: Normal range of motion.  Skin:    General: Skin is warm and dry.  Neurological:     Mental Status: She is alert and oriented to person, place, and time.     Coordination: Coordination abnormal.     Comments: Slow to stand and appears uncomfortable with walking     Vitals:   09/09/19 0953  BP: 136/84  Pulse: 73  Temp: 98.2 F (36.8 C)  TempSrc: Oral  SpO2: 96%  Weight: 253 lb (114.8 kg)  Height: 5\' 5"  (1.651 m)    This visit occurred during the SARS-CoV-2 public health emergency.  Safety protocols were in place, including screening questions prior to the visit, additional usage of staff PPE, and extensive cleaning of exam room while observing appropriate contact time as indicated for disinfecting solutions.   Assessment & Plan:  Toradol 30 mg IM given at visit

## 2019-09-10 ENCOUNTER — Other Ambulatory Visit: Payer: Self-pay | Admitting: Internal Medicine

## 2019-09-10 MED FILL — PREGABALIN 150 MG CAPS: 150 | 30 days supply | Qty: 60 | Fill #0

## 2019-09-10 MED FILL — UNIFINE PENTIPS 31GX3/16": 31G X 5 MM | 25 days supply | Qty: 100 | Fill #0

## 2019-09-10 MED FILL — UNIFINE PENTIPS 31GX3/16: 31G X 5 MM | 25 days supply | Qty: 100 | Fill #0

## 2019-09-10 NOTE — Assessment & Plan Note (Signed)
Will renew FMLA 4 days per month for this. She is not taking lyrica appropriately at this time. Last filled about 6 months ago for 1 month supply. She is asked to start taking this more regularly to help with her pain better. Rx meloxicam 15 mg daily as they work better together.

## 2019-09-10 NOTE — Assessment & Plan Note (Signed)
Acute and not resolving, started about 1-2 weeks ago. No indication for imaging. Given toradol 30 mg IM and okay with FMLA continuous from 08/31/19 to 09/16/19 separate from her normal FMLA. Rx meloxicam 15 mg daily which is helpful to her and can use lyrica. She is overall improving but not ready to work at this time.

## 2019-09-10 NOTE — Assessment & Plan Note (Signed)
Weight is down about 10 pounds in the last 6 months and encouraged further weight loss to help her overall health.

## 2019-09-12 ENCOUNTER — Encounter: Payer: Self-pay | Admitting: Internal Medicine

## 2019-09-12 ENCOUNTER — Telehealth: Payer: Self-pay | Admitting: Internal Medicine

## 2019-09-12 NOTE — Telephone Encounter (Signed)
Yes, I just received the forms via fax.

## 2019-09-12 NOTE — Telephone Encounter (Signed)
Copied from CRM [id number]. Topic: General - Other >> Sep 12, 2019  1:37 PM Jaquita Rector A wrote: Reason for CRM: Patient called to inquire if Dr Okey Dupre received her FMLA papers that were sent in. And is also requesting a return to work letter uploaded to her My Chart please. Please contact patient at Ph#  [phone] Call duration

## 2019-09-12 NOTE — Telephone Encounter (Signed)
Spoke with patient and informed, I have only received [employer] forms.   She is going to have [employer] sent the FMLA forms again.   She is requesting a letter stating her return to work date until forms are completed.

## 2019-09-13 NOTE — Telephone Encounter (Signed)
Okay for work note out 08/31/19 to 09/16/19.

## 2019-09-13 NOTE — Telephone Encounter (Signed)
STD forms have been completed and placed in providers box to review and sign.   Still have not received [employer] FMLA forms.

## 2019-09-13 NOTE — Telephone Encounter (Signed)
Forms have signed, Faxed to [employer], Copy sent to scan &Charged for.   Patient has been informed and orginal mailed to patient.

## 2019-09-13 NOTE — Telephone Encounter (Signed)
Letter done

## 2019-09-16 DIAGNOSIS — H401121 Primary open-angle glaucoma, left eye, mild stage: Secondary | ICD-10-CM | POA: Diagnosis not present

## 2019-09-16 DIAGNOSIS — H401113 Primary open-angle glaucoma, right eye, severe stage: Secondary | ICD-10-CM | POA: Diagnosis not present

## 2019-09-16 MED FILL — ROCKLATAN 0.02-0.005 % SOLN: 0.02-0.005 | 25 days supply | Qty: 3 | Fill #0

## 2019-09-16 NOTE — Telephone Encounter (Signed)
I have received FMLA from [employer] via fax this morning 09/16/19.

## 2019-09-16 NOTE — Telephone Encounter (Signed)
Both sites of forms have been completed and placed in providers box to review and sign.

## 2019-09-18 DIAGNOSIS — Z0279 Encounter for issue of other medical certificate: Secondary | ICD-10-CM

## 2019-09-18 NOTE — Telephone Encounter (Signed)
Forms have been faxed, Copy sent to scan& Charged for.   Patient has been informed and original mailed to her.

## 2019-09-19 MED FILL — COMBIGAN EYE DROPS: 0.2-0.5 | 30 days supply | Qty: 5 | Fill #0

## 2019-09-20 ENCOUNTER — Encounter (HOSPITAL_COMMUNITY): Payer: Self-pay

## 2019-09-20 ENCOUNTER — Telehealth: Payer: Self-pay | Admitting: Internal Medicine

## 2019-09-20 ENCOUNTER — Ambulatory Visit (HOSPITAL_COMMUNITY)
Admission: EM | Admit: 2019-09-20 | Discharge: 2019-09-20 | Disposition: A | Discharge status: Home / Self Care | Payer: 59 | Attending: Family Medicine | Admitting: Family Medicine

## 2019-09-20 ENCOUNTER — Other Ambulatory Visit: Payer: Self-pay

## 2019-09-20 DIAGNOSIS — R1030 Lower abdominal pain, unspecified: Secondary | ICD-10-CM | POA: Insufficient documentation

## 2019-09-20 DIAGNOSIS — R319 Hematuria, unspecified: Secondary | ICD-10-CM | POA: Insufficient documentation

## 2019-09-20 LAB — POCT URINALYSIS DIP (DEVICE)
Bilirubin Urine: NEGATIVE
Glucose, UA: >=1000 mg/dL — AB
Ketones, ur: NEGATIVE mg/dL
Leukocytes,Ua: NEGATIVE
Nitrite: NEGATIVE
Protein, ur: NEGATIVE mg/dL
Specific Gravity, Urine: 1.02 (ref 1.005–1.030)
Urobilinogen, UA: 1 mg/dL (ref 0.0–1.0)
pH: 6 (ref 5.0–8.0)

## 2019-09-20 LAB — CBG MONITORING, ED: Glucose-Capillary: 293 mg/dL — ABNORMAL HIGH (ref 70–99)

## 2019-09-20 LAB — GLUCOSE, CAPILLARY: Glucose-Capillary: 293 mg/dL — ABNORMAL HIGH (ref 70–99)

## 2019-09-20 MED ORDER — TRAMADOL HCL 50 MG PO TABS: 50.0000 mg | ORAL_TABLET | Freq: Four times a day (QID) | ORAL | 0 refills | Status: DC | PRN

## 2019-09-20 MED ORDER — CIPROFLOXACIN HCL 250 MG PO TABS: 250.0000 mg | ORAL_TABLET | Freq: Two times a day (BID) | ORAL | 0 refills | Status: DC

## 2019-09-20 NOTE — ED Triage Notes (Signed)
Patient presents to Urgent Care with complaints of abdominal pain since 3 nights ago. Patient reports she has not taken any medication for pain, last BM was this morning, no frequent or burning urination, denies n/v/d.

## 2019-09-20 NOTE — ED Provider Notes (Signed)
[facility]    CSN: [id number] Arrival date & time: 09/20/19  1858      History   Chief Complaint Chief Complaint  Patient presents with  . Abdominal Pain    HPI Bridget [name] is a 59 y.o. female.   HPI  Patient is here for lower abdominal pain.  Is been present for 3 days.  She has had some nausea but no vomiting.  No fever chills.  No dysuria or frequency.  No constipation or diarrhea.  She does have a history of diverticulitis.  This feels somewhat different.  She does have a history of ovary problems.  She states that this is more centralized.  She does have a history of cystitis.  She does not usually have abdominal pain with this.  She has had no fever or chills.  No body aches.  She is not concerned about Covid, she had that last month.  She states she tried to go to work but had a heating pad on her abdominal area and still was too uncomfortable to try to complete her work duties.  She needs a note for work.  Past Medical History:  Diagnosis Date  . Bacterial vaginosis 12/07/2010  . Depression   . Diabetes mellitus without complication ([place])   . Diverticulosis   . DIVERTICULOSIS, COLON 10/26/2006  . Fibromyalgia   . GERD (gastroesophageal reflux disease)   . Glaucoma   . Headache   . HEEL SPUR 09/10/2007   Qualifier: Diagnosis of  By: Drue Flirt  MD, Merrily Brittle    . Hypertension   . MALAISE AND FATIGUE 05/27/2010  . Obesity   . OBESITY, NOS 10/26/2006  . Osteoarthritis   . OSTEOARTHRITIS, KNEE 04/27/2010  . Retained tampon 05/21/2012  . SHOULDER PAIN, LEFT 05/19/2010   Qualifier: Diagnosis of  By: Annamary Carolin MD, Amber    . Vaginal yeast infection 12/08/2010    Patient Active Problem List   Diagnosis Date Noted  . Right hand weakness 09/18/2017  . Plantar fasciitis 10/10/2016  . Foot pain, bilateral 09/09/2016  . Radiculopathy 09/09/2016  . GERD (gastroesophageal reflux disease) 09/09/2014  . Hyperlipidemia 05/24/2013  . Lung nodule seen on imaging study  09/21/2012  . Chronic pain of multiple sites - Knees, Ankles, Back 11/09/2011  . Lupus (systemic lupus erythematosus) ([place]) 06/28/2011  . HYPOTHYROIDISM 04/27/2010  . Depression 04/27/2010  . OSTEOARTHRITIS, KNEE 04/27/2010  . Back pain 04/27/2010  . Diabetes mellitus type 2, uncontrolled, with complications ([place]) 34/28/7681  . Morbid obesity ([place]) 10/26/2006  . HYPERTENSION, BENIGN SYSTEMIC 10/26/2006  . VENOUS INSUFFICIENCY, CHRONIC 10/26/2006    Past Surgical History:  Procedure Laterality Date  . ABDOMINAL HYSTERECTOMY  1990  . ABDOMINAL HYSTERECTOMY    . BLADDER SURGERY  2001  . CATARACT EXTRACTION Right 2001   with implant  . EYE SURGERY     implant rt eye  . ROTATOR CUFF REPAIR Right   . SHOULDER ARTHROSCOPY WITH SUBACROMIAL DECOMPRESSION Left 07/20/2015   Procedure: SHOULDER ARTHROSCOPY WITH DEBRIDEMENT OF ROTATOR CUFF AND SUBACROMIAL DECOMPRESSION ;  Surgeon: Tania Ade, MD;  Location: [facility];  Service: Orthopedics;  Laterality: Left;  Left shoulder arthroscopy with debridement of rotator cuff and subacromial decompression    OB History   No obstetric history on file.      Home Medications    Prior to Admission medications   Medication Sig Start Date End Date Taking? Authorizing Provider  ACCU-CHEK FASTCLIX LANCETS MISC USE TO CHECK BLOOD SUGAR  2 TIMES PER DAY. 09/17/18   Renato Shin, MD  ACCU-CHEK GUIDE test strip USE TO CHECK BLOOD SUGAR 2 TIMES PER DAY. 05/03/19   Renato Shin, MD  aspirin 81 MG tablet Take 1 tablet (81 mg total) by mouth daily. 09/27/13   Gerda Diss, DO  Blood Glucose Monitoring Suppl (ACCU-CHEK GUIDE) w/Device KIT 2 each by Does not apply route daily. 10/25/16   Renato Shin, MD  cetirizine (ZYRTEC) 10 MG tablet Take 1 tablet (10 mg total) by mouth daily. 12/24/12   de Ronda Fairly, Ivy, DO  ciprofloxacin (CIPRO) 250 MG tablet Take 1 tablet (250 mg total) by mouth every 12 (twelve) hours. 09/20/19   Raylene Everts, MD    docusate sodium (COLACE) 100 MG capsule Take 1 capsule (100 mg total) by mouth 3 (three) times daily as needed. 07/20/15   Grier Mitts, PA-C  fluticasone (FLONASE) 50 MCG/ACT nasal spray Place 2 sprays into both nostrils daily. 05/03/17   Nche, Charlene Brooke, NP  insulin degludec (TRESIBA FLEXTOUCH) 100 UNIT/ML SOPN FlexTouch Pen Inject 0.8 mLs (80 Units total) into the skin daily. 03/13/19   Renato Shin, MD  insulin lispro (HUMALOG KWIKPEN) 100 UNIT/ML KwikPen Inject 0.1 mLs (10 Units total) into the skin 3 (three) times daily. And pen needles 4/day 05/17/19   Renato Shin, MD  lisinopril (ZESTRIL) 5 MG tablet Take 1 tablet (5 mg total) by mouth daily. 09/09/19   Hoyt Koch, MD  meloxicam (MOBIC) 15 MG tablet Take 1 tablet (15 mg total) by mouth daily. 09/09/19   Hoyt Koch, MD  Omega 3-6-9 Fatty Acids (TRIPLE OMEGA-3-6-9) CAPS Take 1 capsule by mouth daily.    [provider]  pantoprazole (PROTONIX) 20 MG tablet TAKE 1 TABLET BY MOUTH DAILY. 12/11/18   Hoyt Koch, MD  pregabalin (LYRICA) 150 MG capsule TAKE 1 CAPSULE BY MOUTH 2 TIMES DAILY. 09/10/19   Hoyt Koch, MD  traMADol (ULTRAM) 50 MG tablet Take 1 tablet (50 mg total) by mouth every 6 (six) hours as needed. 09/20/19   Raylene Everts, MD  triamcinolone (NASACORT AQ) 55 MCG/ACT AERO nasal inhaler Place 2 sprays into the nose daily. 09/27/13   Gerda Diss, DO  UNIFINE PENTIPS 31G X 5 MM MISC USE FOR INSULIN INJECTION 3 TIMES PER DAY 08/17/18   Renato Shin, MD    Family History Family History  Problem Relation Age of Onset  . Breast cancer Mother   . Diabetes Mother   . Cancer Mother   . Ovarian cancer Maternal Grandmother   . Heart disease Maternal Grandmother        great  . Stomach cancer Cousin   . Diabetes Brother        x 3  . Alcohol abuse Father     Social History Social History   Tobacco Use  . Smoking status: Former Smoker    Packs/day: 0.30     Types: Cigarettes    Quit date: 08/29/1998    Years since quitting: 21.0  . Smokeless tobacco: Never Used  . Tobacco comment: Quit in 2000   Substance Use Topics  . Alcohol use: Yes    Comment: socially  . Drug use: No     Allergies   Sulfa antibiotics, Citrus, Invokana [canagliflozin], Codeine, Metformin and related, Metoclopramide hcl, Penicillins, Propoxyphene, Propoxyphene n-acetaminophen, and Sulfonamide derivatives   Review of Systems Review of Systems  Constitutional: Negative for chills and fever.  Gastrointestinal: Positive for abdominal pain  and nausea. Negative for vomiting.       Suprapubic central lower abdominal pain  Genitourinary: Positive for pelvic pain. Negative for flank pain.  Musculoskeletal: Negative for back pain.     Physical Exam Triage Vital Signs ED Triage Vitals  Enc Vitals Group     BP 09/20/19 1916 (!) 129/52     Pulse Rate 09/20/19 1916 92     Resp 09/20/19 1916 16     Temp 09/20/19 1916 98.8 F (37.1 C)     Temp Source 09/20/19 1916 Oral     SpO2 09/20/19 1916 96 %     Weight --      Height --      Head Circumference --      Peak Flow --      Pain Score 09/20/19 1913 10     Pain Loc --      Pain Edu? --      Excl. in [place]? --    No data found.  Updated Vital Signs BP (!) 129/52 (BP Location: Right Arm)   Pulse 92   Temp 98.8 F (37.1 C) (Oral)   Resp 16   SpO2 96%      Physical Exam Constitutional:      General: She is not in acute distress.    Appearance: She is well-developed.  HENT:     Head: Normocephalic and atraumatic.     Mouth/Throat:     Comments: Mask in place Eyes:     Conjunctiva/sclera: Conjunctivae normal.     Pupils: Pupils are equal, round, and reactive to light.  Cardiovascular:     Rate and Rhythm: Normal rate and regular rhythm.     Heart sounds: Normal heart sounds.  Pulmonary:     Effort: Pulmonary effort is normal. No respiratory distress.     Breath sounds: Normal breath sounds.  Abdominal:      General: There is no distension.     Palpations: Abdomen is soft.     Comments: Large abdominal pannus.  Lifting up pannus there is tenderness over the suprapubic region.  Mild.  No guarding.  No rebound.  There is also mild to moderate tenderness in the left lower quadrant.  No guarding.  No rebound.  No tenderness in the right abdomen.  Musculoskeletal:        General: Normal range of motion.     Cervical back: Normal range of motion.  Skin:    General: Skin is warm and dry.  Neurological:     Mental Status: She is alert. Mental status is at baseline.  Psychiatric:        Mood and Affect: Mood normal.        Behavior: Behavior normal.      UC Treatments / Results  Labs (all labs ordered are listed, but only abnormal results are displayed) Labs Reviewed  GLUCOSE, CAPILLARY - Abnormal; Notable for the following components:      Result Value   Glucose-Capillary 293 (*)    All other components within normal limits  POCT URINALYSIS DIP (DEVICE) - Abnormal; Notable for the following components:   Glucose, UA >=1000 (*)    Hgb urine dipstick MODERATE (*)    All other components within normal limits  CBG MONITORING, ED - Abnormal; Notable for the following components:   Glucose-Capillary 293 (*)    All other components within normal limits  URINE CULTURE    EKG   Radiology No results found.  Procedures Procedures (including  critical care time)  Medications Ordered in UC Medications - No data to display  Initial Impression / Assessment and Plan / UC Course  I have reviewed the triage vital signs and the nursing notes.  Pertinent labs & imaging results that were available during my care of the patient were reviewed by me and considered in my medical decision making (see chart for details).     Patient has diverticular disease with a history of diverticulitis.  She has had a hysterectomy but has both her ovaries.  None of this tenderness or symptoms is suggestive of  ovarian disease.  She does have suprapubic tenderness and hematuria.  We will send the urine for culture.  We will place her on Cipro because this would help both diverticular disease and  cystitis.  She is taking previously successfully Final Clinical Impressions(s) / UC Diagnoses   Final diagnoses:  Lower abdominal pain  Hematuria, unspecified type     Discharge Instructions     Take the antibiotic 2 times a day Increase your fluids Bland diet Take the tramadol as needed for pain.  Take with food.  Do not drive on tramadol Call your doctor if not improving by Monday Go to the ER if you have worsening pain, severe pain, nausea and vomiting, high fever    ED Prescriptions    Medication Sig Dispense Auth. Provider   ciprofloxacin (CIPRO) 250 MG tablet Take 1 tablet (250 mg total) by mouth every 12 (twelve) hours. 14 tablet Raylene Everts, MD   traMADol (ULTRAM) 50 MG tablet Take 1 tablet (50 mg total) by mouth every 6 (six) hours as needed. 15 tablet Raylene Everts, MD     I have reviewed the PDMP during this encounter.   Raylene Everts, MD 09/20/19 2036

## 2019-09-20 NOTE — Discharge Instructions (Signed)
Take the antibiotic 2 times a day Increase your fluids Bland diet Take the tramadol as needed for pain.  Take with food.  Do not drive on tramadol Call your doctor if not improving by Monday Go to the ER if you have worsening pain, severe pain, nausea and vomiting, high fever

## 2019-09-20 NOTE — Telephone Encounter (Signed)
    Patient calling to report "pain in ovary area" for 2 days. No other symptoms Patient requesting advice before scheduling.   Please advise

## 2019-09-22 LAB — URINE CULTURE
Culture: <10000 — AB
Special Requests: NORMAL

## 2019-09-23 NOTE — Telephone Encounter (Signed)
Can call gyn if she has one

## 2019-09-23 NOTE — Telephone Encounter (Signed)
Called pt, LVM.   

## 2019-09-26 ENCOUNTER — Other Ambulatory Visit: Payer: Self-pay | Admitting: Endocrinology

## 2019-09-26 ENCOUNTER — Other Ambulatory Visit: Payer: Self-pay

## 2019-09-26 DIAGNOSIS — IMO0002 Reserved for concepts with insufficient information to code with codable children: Secondary | ICD-10-CM

## 2019-09-26 DIAGNOSIS — E1165 Type 2 diabetes mellitus with hyperglycemia: Secondary | ICD-10-CM

## 2019-09-26 MED ORDER — FREESTYLE LITE DEVI: 1.0000 | Freq: Two times a day (BID) | 0 refills | Status: DC

## 2019-09-26 MED ORDER — FREESTYLE LANCETS MISC: 1.0000 | Freq: Two times a day (BID) | 0 refills | Status: DC

## 2019-09-26 MED ORDER — FREESTYLE LITE TEST VI STRP: 1.0000 | ORAL_STRIP | Freq: Two times a day (BID) | 0 refills | Status: DC

## 2019-10-03 ENCOUNTER — Other Ambulatory Visit: Payer: Self-pay

## 2019-10-03 ENCOUNTER — Telehealth: Payer: Self-pay | Admitting: Endocrinology

## 2019-10-03 ENCOUNTER — Other Ambulatory Visit: Payer: Self-pay | Admitting: Endocrinology

## 2019-10-03 MED ORDER — INSULIN LISPRO (1 UNIT DIAL) 100 UNIT/ML (KWIKPEN): 15.0000 [IU] | PEN_INJECTOR | Freq: Every day | SUBCUTANEOUS | 0 refills | Status: DC

## 2019-10-03 NOTE — Telephone Encounter (Signed)
Requested changes cannot be made as documented below:  insulin lispro (HUMALOG KWIKPEN) 100 UNIT/ML KwikPen [[id number]]    Order Details Dose: 15 Units Route: Subcutaneous Frequency: Daily  Dispense Quantity: 5 mL Refills: 0   Note to Pharmacy: WILL ONLY PROVIDE 30 DAY SUPPLY. PT IS SCHEDULED FOR OFFICE VISIT TOMORROW. ADJUSTMENTS TO THIS DOSAGE IS LIKELY TO BE MADE AND WILL REQUIRE NEW RX. FURTHER EDITS TO THIS RX WILL BE ADDRESSED TOMORROW

## 2019-10-03 NOTE — Telephone Encounter (Signed)
Pharmacy called stating the Humalog needs to be pens instead of 1 pen.

## 2019-10-04 ENCOUNTER — Encounter: Payer: Self-pay | Admitting: Endocrinology

## 2019-10-04 ENCOUNTER — Ambulatory Visit: Payer: 59 | Admitting: Endocrinology

## 2019-10-04 ENCOUNTER — Other Ambulatory Visit: Payer: Self-pay

## 2019-10-04 VITALS — BP 130/78 | HR 77 | Ht 65.0 in | Wt 257.2 lb

## 2019-10-04 DIAGNOSIS — IMO0002 Reserved for concepts with insufficient information to code with codable children: Secondary | ICD-10-CM

## 2019-10-04 DIAGNOSIS — E1165 Type 2 diabetes mellitus with hyperglycemia: Secondary | ICD-10-CM | POA: Diagnosis not present

## 2019-10-04 DIAGNOSIS — E118 Type 2 diabetes mellitus with unspecified complications: Secondary | ICD-10-CM

## 2019-10-04 LAB — POCT GLYCOSYLATED HEMOGLOBIN (HGB A1C): Hemoglobin A1C: 8.4 % — AB (ref 4.0–5.6)

## 2019-10-04 MED ORDER — TRULICITY 0.75 MG/0.5ML ~~LOC~~ SOAJ: 0.7500 mg | SUBCUTANEOUS | 11 refills | Status: DC

## 2019-10-04 MED FILL — TRULICITY 0.75 MG/0.5 ML PE: 0.75 | 28 days supply | Qty: 2 | Fill #0

## 2019-10-04 NOTE — Patient Instructions (Addendum)
Please continue the same [place], and: Please stop taking the Humalog, and: I have sent a prescription to your pharmacy, to start Trulicity. check your blood sugar twice a day.  vary the time of day when you check, between before the 3 meals, and at bedtime.  also check if you have symptoms of your blood sugar being too high or too low.  please keep a record of the readings and bring it to your next appointment here (or you can bring the meter itself).  You can write it on any piece of paper.  please call us sooner if your blood sugar goes below 70, or if you have a lot of readings over 200. Please come back for a follow-up appointment in 1 month.

## 2019-10-04 NOTE — Progress Notes (Addendum)
Subjective:    Patient ID: Bridget [name], female    DOB: 17-Apr-1961, 59 y.o.   MRN: [id number]  HPI Pt returns for f/u of diabetes mellitus: DM type: Insulin-requiring type 2.  Dx'ed: 2000 Complications: polyneuropathy.   Therapy: insulin + bromocriptine.    GDM: never.  DKA: never.   Severe hypoglycemia: never.   Pancreatitis: never.  SDOH: she works 3rd shift.  Other: she did not tolerate invokana (vaginitis) or metformin-XR (diarrhea); edema precludes pioglitizone rx.  she also took insulin from 2000-2003; she resumed in 2017, She declines multiple daily injections, but basal insulin is emphasized, with improved results. Interval history: she has recovered from coronavirus (pt says she did not receive steroid rx).  She takes humalog 10/d (with supper), and tresiba 80/d. Meter is downloaded today, and the printout is scanned into the record. cbg varies from 80-220 (however, she says xbg goes as low as 60). It is in general higher as the day goes on.   Past Medical History:  Diagnosis Date  . Bacterial vaginosis 12/07/2010  . Depression   . Diabetes mellitus without complication ([facility])   . Diverticulosis   . DIVERTICULOSIS, COLON 10/26/2006  . Fibromyalgia   . GERD (gastroesophageal reflux disease)   . Glaucoma   . Headache   . HEEL SPUR 09/10/2007   Qualifier: Diagnosis of  By: Lanier Prude  MD, Cathrine Muster    . Hypertension   . MALAISE AND FATIGUE 05/27/2010  . Obesity   . OBESITY, NOS 10/26/2006  . Osteoarthritis   . OSTEOARTHRITIS, KNEE 04/27/2010  . Retained tampon 05/21/2012  . SHOULDER PAIN, LEFT 05/19/2010   Qualifier: Diagnosis of  By: Clotilde Dieter MD, Amber    . Vaginal yeast infection 12/08/2010    Past Surgical History:  Procedure Laterality Date  . ABDOMINAL HYSTERECTOMY  1990  . ABDOMINAL HYSTERECTOMY    . BLADDER SURGERY  2001  . CATARACT EXTRACTION Right 2001   with implant  . EYE SURGERY     implant rt eye  . ROTATOR CUFF REPAIR Right   . SHOULDER ARTHROSCOPY WITH  SUBACROMIAL DECOMPRESSION Left 07/20/2015   Procedure: SHOULDER ARTHROSCOPY WITH DEBRIDEMENT OF ROTATOR CUFF AND SUBACROMIAL DECOMPRESSION ;  Surgeon: Jones Broom, MD;  Location: [facility];  Service: Orthopedics;  Laterality: Left;  Left shoulder arthroscopy with debridement of rotator cuff and subacromial decompression    Social History   Socioeconomic History  . Marital status: Divorced    Spouse name: Not on file  . Number of children: 2  . Years of education: Not on file  . Highest education level: Not on file  Occupational History  . Occupation: PBX Nutritional therapist: [employer]  Tobacco Use  . Smoking status: Former Smoker    Packs/day: 0.30    Types: Cigarettes    Quit date: 08/29/1998    Years since quitting: 21.1  . Smokeless tobacco: Never Used  . Tobacco comment: Quit in 2000   Substance and Sexual Activity  . Alcohol use: Yes    Comment: socially  . Drug use: No  . Sexual activity: Yes    Birth control/protection: Surgical  Other Topics Concern  . 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 Difficulty of Paying Living Expenses: Not on file  Food Insecurity:   . Worried About Programme researcher, broadcasting/film/video in the Last  Year: Not on file  . Ran Out of Food in the Last Year: Not on file  Transportation Needs:   . Lack of Transportation (Medical): Not on file  . Lack of Transportation (Non-Medical): Not on file  Physical Activity:   . Days of Exercise per Week: Not on file  . Minutes of Exercise per Session: Not on file  Stress:   . Feeling of Stress : Not on file  Social Connections:   . Frequency of Communication with Friends and Family: Not on file  . Frequency of Social Gatherings with Friends and Family: Not on file  . Attends Religious Services: Not on file  . Active Member of Clubs or Organizations: Not on file  . Attends Tax inspector Meetings: Not on file  . Marital Status: Not on file  Intimate Partner Violence:   . Fear of Current or Ex-Partner: Not on file  . Emotionally Abused: Not on file  . Physically Abused: Not on file  . Sexually Abused: Not on file    Current Outpatient Medications on File Prior to Visit  Medication Sig Dispense Refill  . aspirin 81 MG tablet Take 1 tablet (81 mg total) by mouth daily.    . Blood Glucose Monitoring Suppl (FREESTYLE LITE) DEVI 1 each by Does not apply route 2 (two) times daily. E11.9 1 each 0  . cetirizine (ZYRTEC) 10 MG tablet Take 1 tablet (10 mg total) by mouth daily. 30 tablet 11  . docusate sodium (COLACE) 100 MG capsule Take 1 capsule (100 mg total) by mouth 3 (three) times daily as needed. 20 capsule 0  . fluticasone (FLONASE) 50 MCG/ACT nasal spray Place 2 sprays into both nostrils daily. 16 g 0  . glucose blood (FREESTYLE LITE) test strip 1 each by Other route 2 (two) times daily. E11.9 200 each 0  . insulin degludec (TRESIBA FLEXTOUCH) 100 UNIT/ML SOPN FlexTouch Pen Inject 0.8 mLs (80 Units total) into the skin daily. 20 pen 3  . Lancets (FREESTYLE) lancets 1 each by Other route 2 (two) times daily. E11.9 200 each 0  . lisinopril (ZESTRIL) 5 MG tablet Take 1 tablet (5 mg total) by mouth daily. 90 tablet 3  . meloxicam (MOBIC) 15 MG tablet Take 1 tablet (15 mg total) by mouth daily. 30 tablet 3  . Omega 3-6-9 Fatty Acids (TRIPLE OMEGA-3-6-9) CAPS Take 1 capsule by mouth daily.    . pantoprazole (PROTONIX) 20 MG tablet TAKE 1 TABLET BY MOUTH DAILY. 30 tablet PRN  . pregabalin (LYRICA) 150 MG capsule TAKE 1 CAPSULE BY MOUTH 2 TIMES DAILY. 60 capsule 0  . traMADol (ULTRAM) 50 MG tablet Take 1 tablet (50 mg total) by mouth every 6 (six) hours as needed. 15 tablet 0  . triamcinolone (NASACORT AQ) 55 MCG/ACT AERO nasal inhaler Place 2 sprays into the nose daily. 1 Inhaler 12  . UNIFINE PENTIPS 31G X 5 MM MISC USE FOR INSULIN INJECTION 3 TIMES PER DAY 100 each 8     No current facility-administered medications on file prior to visit.    Allergies  Allergen Reactions  . Sulfa Antibiotics Hives and Rash  . Citrus Itching  . Invokana [Canagliflozin] Other (See Comments)    Caused frequent vaginal yeast infections  . Codeine Hives  . Metformin And Related Diarrhea and Nausea And Vomiting  . Metoclopramide Hcl Nausea And Vomiting  . Penicillins Nausea And Vomiting  . Propoxyphene Nausea And Vomiting    GI upset  . Propoxyphene N-Acetaminophen  Nausea And Vomiting  . Sulfonamide Derivatives Hives    Family History  Problem Relation Age of Onset  . Breast cancer Mother   . Diabetes Mother   . Cancer Mother   . Ovarian cancer Maternal Grandmother   . Heart disease Maternal Grandmother        great  . Stomach cancer Cousin   . Diabetes Brother        x 3  . Alcohol abuse Father     BP 130/78   Pulse 77   Ht 5\' 5"  (1.651 m)   Wt 257 lb 3.2 oz (116.7 kg)   SpO2 97%   BMI 42.80 kg/m   Review of Systems Denies LOC    Objective:   Physical Exam VITAL SIGNS:  See vs page GENERAL: no distress Pulses: dorsalis pedis intact bilat.   MSK: no deformity of the feet CV: 1+ bilat leg edema Skin:  no ulcer on the feet.  normal color and temp on the feet. Neuro: sensation is intact to touch on the feet Ext: there is bilateral onychomycosis of the toenails   Lab Results  Component Value Date   HGBA1C 8.4 (A) 10/04/2019       Assessment & Plan:  Insulin-requiring type 2 DM, with PN: worse Morbid obesity: Trulicity might help.   Patient Instructions  Please continue the same [place], and: Please stop taking the Humalog, and: I have sent a prescription to your pharmacy, to start Trulicity. check your blood sugar twice a day.  vary the time of day when you check, between before the 3 meals, and at bedtime.  also check if you have symptoms of your blood sugar being too high or too low.  please keep a record of the readings and bring it  to your next appointment here (or you can bring the meter itself).  You can write it on any piece of paper.  please call us sooner if your blood sugar goes below 70, or if you have a lot of readings over 200. Please come back for a follow-up appointment in 1 month.

## 2019-10-07 MED FILL — MELOXICAM 15 MG TABLET: 15 | 30 days supply | Qty: 30 | Fill #1

## 2019-10-07 MED FILL — FREESTYLE LANCETS: 50 days supply | Qty: 100 | Fill #0

## 2019-10-07 MED FILL — FREESTYLE LITE METER: 30 days supply | Qty: 1 | Fill #0

## 2019-10-07 MED FILL — FREESTYLE LITE TEST STRIP: 50 days supply | Qty: 100 | Fill #0

## 2019-10-29 ENCOUNTER — Other Ambulatory Visit: Payer: Self-pay | Admitting: Internal Medicine

## 2019-10-29 MED FILL — TRULICITY 0.75 MG/0.5 ML PE: 0.75 | 28 days supply | Qty: 2 | Fill #1

## 2019-10-29 MED FILL — ROCKLATAN 0.02-0.005 % SOLN: 0.02-0.005 | 25 days supply | Qty: 3 | Fill #1

## 2019-10-29 MED FILL — PREGABALIN 150 MG CAPS: 150 | 30 days supply | Qty: 60 | Fill #0

## 2019-10-29 NOTE — Telephone Encounter (Signed)
09/10/2019 Pregabalin 150 Mg Capsule 60#  Last ov 09/09/19 Next ov n/s

## 2019-10-30 MED FILL — TRESIBA FLEXTOUCH 100 UNITS: 100 | 60 days supply | Qty: 48 | Fill #2

## 2019-10-31 ENCOUNTER — Telehealth: Payer: Self-pay

## 2019-10-31 NOTE — Telephone Encounter (Signed)
PRIOR AUTHORIZATION  PA initiation date: 10/31/19  Medication: Evaristo Bury U100 [employer]: [employer] completed electronically through [employer] My Meds: Yes  Will await insurance response re: approval/denial.

## 2019-10-31 NOTE — Telephone Encounter (Signed)
CHERELLE MIDKIFF (Key: [id number]) Rx #: [phone] [phone] Evaristo Bury FlexTouch (insulin degludec injection) 100 Units/mL solution   Form MedImpact Medication Request Form  Plan Contact [phone] phone [phone] fax Created 41 minutes ago Sent to Plan 17 minutes ago Determination Wait for Determination Please wait for MedImpact 2017 to return a determination.

## 2019-11-01 ENCOUNTER — Other Ambulatory Visit: Payer: Self-pay

## 2019-11-01 NOTE — Telephone Encounter (Signed)
APPROVAL  Medication: [employer]: Med[employer] PA response: Authorization is not required as it is a covered benefit. Billing questions, call customer service @[phone]  Documents have been labeled and placed in scan file for HIM and for our future reference.

## 2019-11-04 ENCOUNTER — Ambulatory Visit: Payer: 59 | Admitting: Endocrinology

## 2019-11-04 ENCOUNTER — Encounter: Payer: Self-pay | Admitting: Endocrinology

## 2019-11-04 ENCOUNTER — Other Ambulatory Visit: Payer: Self-pay

## 2019-11-04 VITALS — BP 138/78 | HR 85 | Ht 65.0 in | Wt 259.0 lb

## 2019-11-04 DIAGNOSIS — E1165 Type 2 diabetes mellitus with hyperglycemia: Secondary | ICD-10-CM

## 2019-11-04 DIAGNOSIS — IMO0002 Reserved for concepts with insufficient information to code with codable children: Secondary | ICD-10-CM

## 2019-11-04 DIAGNOSIS — E118 Type 2 diabetes mellitus with unspecified complications: Secondary | ICD-10-CM

## 2019-11-04 NOTE — Progress Notes (Signed)
Subjective:    Patient ID: Bridget [name], female    DOB: 07-Jun-1961, 59 y.o.   MRN: [id number]  HPI Pt returns for f/u of diabetes mellitus: DM type: Insulin-requiring type 2.  Dx'ed: 2000 Complications: polyneuropathy.   Therapy: insulin and Trulicity.   GDM: never.  DKA: never.   Severe hypoglycemia: never.   Pancreatitis: never.  SDOH: she works 3rd shift.  Other: she did not tolerate invokana (vaginitis) or metformin-XR (diarrhea); edema precludes pioglitizone rx.  she also took insulin from 2000-2003; she resumed in 2017, She declines multiple daily injections.   Interval history: she has recovered from coronavirus (pt she did not receive steroid rx).  Meter is downloaded today, and the printout is scanned into the record. cbg varies from 61-207. It is in general higher PC than AC.   Past Medical History:  Diagnosis Date  . Bacterial vaginosis 12/07/2010  . Depression   . Diabetes mellitus without complication (HCC)   . Diverticulosis   . DIVERTICULOSIS, COLON 10/26/2006  . Fibromyalgia   . GERD (gastroesophageal reflux disease)   . Glaucoma   . Headache   . HEEL SPUR 09/10/2007   Qualifier: Diagnosis of  By: Lanier Prude  MD, Cathrine Muster    . Hypertension   . MALAISE AND FATIGUE 05/27/2010  . Obesity   . OBESITY, NOS 10/26/2006  . Osteoarthritis   . OSTEOARTHRITIS, KNEE 04/27/2010  . Retained tampon 05/21/2012  . SHOULDER PAIN, LEFT 05/19/2010   Qualifier: Diagnosis of  By: Clotilde Dieter MD, Amber    . Vaginal yeast infection 12/08/2010    Past Surgical History:  Procedure Laterality Date  . ABDOMINAL HYSTERECTOMY  1990  . ABDOMINAL HYSTERECTOMY    . BLADDER SURGERY  2001  . CATARACT EXTRACTION Right 2001   with implant  . EYE SURGERY     implant rt eye  . ROTATOR CUFF REPAIR Right   . SHOULDER ARTHROSCOPY WITH SUBACROMIAL DECOMPRESSION Left 07/20/2015   Procedure: SHOULDER ARTHROSCOPY WITH DEBRIDEMENT OF ROTATOR CUFF AND SUBACROMIAL DECOMPRESSION ;  Surgeon: Jones Broom, MD;  Location: [facility];  Service: Orthopedics;  Laterality: Left;  Left shoulder arthroscopy with debridement of rotator cuff and subacromial decompression    Social History   Socioeconomic History  . Marital status: Divorced    Spouse name: Not on file  . Number of children: 2  . Years of education: Not on file  . Highest education level: Not on file  Occupational History  . Occupation: PBX Nutritional therapist: [employer]  Tobacco Use  . Smoking status: Former Smoker    Packs/day: 0.30    Types: Cigarettes    Quit date: 08/29/1998    Years since quitting: 21.1  . Smokeless tobacco: Never Used  . Tobacco comment: Quit in 2000   Substance and Sexual Activity  . Alcohol use: Yes    Comment: socially  . Drug use: No  . Sexual activity: Yes    Birth control/protection: Surgical  Other Topics Concern  . 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 Difficulty of Paying Living Expenses: Not on file  Food Insecurity:   . Worried About Programme researcher, broadcasting/film/video in the Last Year: Not on file  . Ran Out of Food in the Last Year: Not on file  Transportation Needs:   . Lack of Transportation (Medical): Not  on file  . Lack of Transportation (Non-Medical): Not on file  Physical Activity:   . Days of Exercise per Week: Not on file  . Minutes of Exercise per Session: Not on file  Stress:   . Feeling of Stress : Not on file  Social Connections:   . Frequency of Communication with Friends and Family: Not on file  . Frequency of Social Gatherings with Friends and Family: Not on file  . Attends Religious Services: Not on file  . Active Member of Clubs or Organizations: Not on file  . Attends Banker Meetings: Not on file  . Marital Status: Not on file  Intimate Partner Violence:   . Fear of Current or Ex-Partner: Not on file  . Emotionally Abused:  Not on file  . Physically Abused: Not on file  . Sexually Abused: Not on file    Current Outpatient Medications on File Prior to Visit  Medication Sig Dispense Refill  . aspirin 81 MG tablet Take 1 tablet (81 mg total) by mouth daily.    . Blood Glucose Monitoring Suppl (FREESTYLE LITE) DEVI 1 each by Does not apply route 2 (two) times daily. E11.9 1 each 0  . cetirizine (ZYRTEC) 10 MG tablet Take 1 tablet (10 mg total) by mouth daily. 30 tablet 11  . docusate sodium (COLACE) 100 MG capsule Take 1 capsule (100 mg total) by mouth 3 (three) times daily as needed. 20 capsule 0  . Dulaglutide (TRULICITY) 0.75 MG/0.5ML SOPN Inject 0.75 mg into the skin once a week. 4 pen 11  . fluticasone (FLONASE) 50 MCG/ACT nasal spray Place 2 sprays into both nostrils daily. 16 g 0  . glucose blood (FREESTYLE LITE) test strip 1 each by Other route 2 (two) times daily. E11.9 200 each 0  . insulin degludec (TRESIBA FLEXTOUCH) 100 UNIT/ML SOPN FlexTouch Pen Inject 0.8 mLs (80 Units total) into the skin daily. 20 pen 3  . Lancets (FREESTYLE) lancets 1 each by Other route 2 (two) times daily. E11.9 200 each 0  . lisinopril (ZESTRIL) 5 MG tablet Take 1 tablet (5 mg total) by mouth daily. 90 tablet 3  . meloxicam (MOBIC) 15 MG tablet Take 1 tablet (15 mg total) by mouth daily. 30 tablet 3  . Omega 3-6-9 Fatty Acids (TRIPLE OMEGA-3-6-9) CAPS Take 1 capsule by mouth daily.    . pantoprazole (PROTONIX) 20 MG tablet TAKE 1 TABLET BY MOUTH DAILY. 30 tablet PRN  . pregabalin (LYRICA) 150 MG capsule TAKE 1 CAPSULE BY MOUTH 2 TIMES DAILY. 60 capsule 0  . traMADol (ULTRAM) 50 MG tablet Take 1 tablet (50 mg total) by mouth every 6 (six) hours as needed. 15 tablet 0  . triamcinolone (NASACORT AQ) 55 MCG/ACT AERO nasal inhaler Place 2 sprays into the nose daily. 1 Inhaler 12  . UNIFINE PENTIPS 31G X 5 MM MISC USE FOR INSULIN INJECTION 3 TIMES PER DAY 100 each 8   No current facility-administered medications on file prior to  visit.    Allergies  Allergen Reactions  . Sulfa Antibiotics Hives and Rash  . Citrus Itching  . Invokana [Canagliflozin] Other (See Comments)    Caused frequent vaginal yeast infections  . Codeine Hives  . Metformin And Related Diarrhea and Nausea And Vomiting  . Metoclopramide Hcl Nausea And Vomiting  . Penicillins Nausea And Vomiting  . Propoxyphene Nausea And Vomiting    GI upset  . Propoxyphene N-Acetaminophen Nausea And Vomiting  . Sulfonamide Derivatives Hives  Family History  Problem Relation Age of Onset  . Breast cancer Mother   . Diabetes Mother   . Cancer Mother   . Ovarian cancer Maternal Grandmother   . Heart disease Maternal Grandmother        great  . Stomach cancer Cousin   . Diabetes Brother        x 3  . Alcohol abuse Father     BP 138/78 (BP Location: Left Wrist, Patient Position: Sitting, Cuff Size: Large)   Pulse 85   Ht 5\' 5"  (1.651 m)   Wt 259 lb (117.5 kg)   SpO2 98%   BMI 43.10 kg/m    Review of Systems Pt says nausea is mild, but it persists.      Objective:   Physical Exam VITAL SIGNS:  See vs page GENERAL: no distress Pulses: dorsalis pedis intact bilat.   MSK: no deformity of the feet.   CV: trace bilat leg edema.   Skin:  no ulcer on the feet.  normal color and temp on the feet.   Neuro: sensation is intact to touch on the feet.        Assessment & Plan:  Insulin-requiring type 2 DM, with PN: uncertain glycemic control.  Nausea, new.  We can't increase trulicity now Hypoglycemia: this limits aggressiveness of glycemic control.    Patient Instructions  Please continue the same medications.  check your blood sugar twice a day.  vary the time of day when you check, between before the 3 meals, and at bedtime.  also check if you have symptoms of your blood sugar being too high or too low.  please keep a record of the readings and bring it to your next appointment here (or you can bring the meter itself).  You can write it on  any piece of paper.  please call us sooner if your blood sugar goes below 70, or if you have a lot of readings over 200. Please come back for a follow-up appointment in 1 month.

## 2019-11-04 NOTE — Patient Instructions (Addendum)
Please continue the same medications.  check your blood sugar twice a day.  vary the time of day when you check, between before the 3 meals, and at bedtime.  also check if you have symptoms of your blood sugar being too high or too low.  please keep a record of the readings and bring it to your next appointment here (or you can bring the meter itself).  You can write it on any piece of paper.  please call us sooner if your blood sugar goes below 70, or if you have a lot of readings over 200. Please come back for a follow-up appointment in 1 month.

## 2019-11-05 DIAGNOSIS — Z6841 Body Mass Index (BMI) 40.0 and over, adult: Secondary | ICD-10-CM | POA: Diagnosis not present

## 2019-11-05 DIAGNOSIS — R300 Dysuria: Secondary | ICD-10-CM | POA: Diagnosis not present

## 2019-11-05 DIAGNOSIS — N952 Postmenopausal atrophic vaginitis: Secondary | ICD-10-CM | POA: Diagnosis not present

## 2019-11-05 DIAGNOSIS — Z1231 Encounter for screening mammogram for malignant neoplasm of breast: Secondary | ICD-10-CM | POA: Diagnosis not present

## 2019-11-05 DIAGNOSIS — Z01419 Encounter for gynecological examination (general) (routine) without abnormal findings: Secondary | ICD-10-CM | POA: Diagnosis not present

## 2019-11-05 DIAGNOSIS — B373 Candidiasis of vulva and vagina: Secondary | ICD-10-CM | POA: Diagnosis not present

## 2019-11-05 MED FILL — metroNIDAZOLE 500 MG TABS: 500 | 1 days supply | Qty: 4 | Fill #0

## 2019-11-05 MED FILL — ESTRADIOL 10 MCG TABS: 10 | 28 days supply | Qty: 18 | Fill #0

## 2019-11-11 MED FILL — PANTOPRAZOLE SOD DR 20 MG T: 20 | 90 days supply | Qty: 90 | Fill #2

## 2019-11-14 MED FILL — UNIFINE PENTIPS 31GX3/16: 31G X 5 MM | 25 days supply | Qty: 100 | Fill #1

## 2019-11-14 MED FILL — MELOXICAM 15 MG TABLET: 15 | 30 days supply | Qty: 30 | Fill #2

## 2019-11-26 MED FILL — TRULICITY 0.75 MG/0.5 ML PE: 0.75 | 28 days supply | Qty: 2 | Fill #2

## 2019-11-26 MED FILL — ROCKLATAN 0.02-0.005 % SOLN: 0.02-0.005 | 25 days supply | Qty: 3 | Fill #2

## 2019-12-02 MED FILL — FREESTYLE LITE TEST STRIP: 50 days supply | Qty: 100 | Fill #1

## 2019-12-06 ENCOUNTER — Other Ambulatory Visit: Payer: Self-pay

## 2019-12-09 ENCOUNTER — Other Ambulatory Visit: Payer: Self-pay

## 2019-12-09 ENCOUNTER — Ambulatory Visit: Payer: 59 | Admitting: Endocrinology

## 2019-12-09 ENCOUNTER — Encounter: Payer: Self-pay | Admitting: Endocrinology

## 2019-12-09 VITALS — BP 132/82 | HR 85 | Ht 65.0 in | Wt 258.4 lb

## 2019-12-09 DIAGNOSIS — E1165 Type 2 diabetes mellitus with hyperglycemia: Secondary | ICD-10-CM

## 2019-12-09 DIAGNOSIS — E118 Type 2 diabetes mellitus with unspecified complications: Secondary | ICD-10-CM

## 2019-12-09 DIAGNOSIS — IMO0002 Reserved for concepts with insufficient information to code with codable children: Secondary | ICD-10-CM

## 2019-12-09 LAB — POCT GLYCOSYLATED HEMOGLOBIN (HGB A1C): Hemoglobin A1C: 6.5 % — AB (ref 4.0–5.6)

## 2019-12-09 MED ORDER — TRESIBA FLEXTOUCH 100 UNIT/ML ~~LOC~~ SOPN: 75.0000 [IU] | PEN_INJECTOR | Freq: Every day | SUBCUTANEOUS | 3 refills | Status: DC

## 2019-12-09 NOTE — Patient Instructions (Addendum)
Please reduce the Tresiba to 75 units daily, and: continue the same Trulicity.  check your blood sugar twice a day.  vary the time of day when you check, between before the 3 meals, and at bedtime.  also check if you have symptoms of your blood sugar being too high or too low.  please keep a record of the readings and bring it to your next appointment here (or you can bring the meter itself).  You can write it on any piece of paper.  please call us sooner if your blood sugar goes below 70, or if you have a lot of readings over 200. Please come back for a follow-up appointment in 2-3 months.

## 2019-12-09 NOTE — Progress Notes (Signed)
Subjective:    Patient ID: Bridget [name], female    DOB: [birth year], 59 y.o.   MRN: [id number]  HPI Pt returns for f/u of diabetes mellitus: DM type: Insulin-requiring type 2.  Dx'ed: 5462 Complications: polyneuropathy.   Therapy: insulin and Trulicity.   GDM: never.  DKA: never.   Severe hypoglycemia: never.   Pancreatitis: never.  SDOH: she works 3rd shift.  Other: she did not tolerate invokana (vaginitis) or metformin-XR (diarrhea); edema precludes pioglitizone rx.  she also took insulin from 2000-2003; she resumed in 2017, She declines multiple daily injections.   Interval history: she has recovered from coronavirus (pt she did not receive steroid rx).  Meter is downloaded today, and the printout is scanned into the record. cbg varies from 69-133.  It is in general higher as the day goes on.   Past Medical History:  Diagnosis Date  . Bacterial vaginosis 12/07/2010  . Depression   . Diabetes mellitus without complication ([place])   . Diverticulosis   . DIVERTICULOSIS, COLON 10/26/2006  . Fibromyalgia   . GERD (gastroesophageal reflux disease)   . Glaucoma   . Headache   . HEEL SPUR 09/10/2007   Qualifier: Diagnosis of  By: Drue Flirt  MD, Merrily Brittle    . Hypertension   . MALAISE AND FATIGUE 05/27/2010  . Obesity   . OBESITY, NOS 10/26/2006  . Osteoarthritis   . OSTEOARTHRITIS, KNEE 04/27/2010  . Retained tampon 05/21/2012  . SHOULDER PAIN, LEFT 05/19/2010   Qualifier: Diagnosis of  By: Annamary Carolin MD, Amber    . Vaginal yeast infection 12/08/2010    Past Surgical History:  Procedure Laterality Date  . ABDOMINAL HYSTERECTOMY  1990  . ABDOMINAL HYSTERECTOMY    . BLADDER SURGERY  2001  . CATARACT EXTRACTION Right 2001   with implant  . EYE SURGERY     implant rt eye  . ROTATOR CUFF REPAIR Right   . SHOULDER ARTHROSCOPY WITH SUBACROMIAL DECOMPRESSION Left 07/20/2015   Procedure: SHOULDER ARTHROSCOPY WITH DEBRIDEMENT OF ROTATOR CUFF AND SUBACROMIAL DECOMPRESSION ;  Surgeon: Tania Ade, MD;  Location: [facility];  Service: Orthopedics;  Laterality: Left;  Left shoulder arthroscopy with debridement of rotator cuff and subacromial decompression    Social History   Socioeconomic History  . Marital status: Divorced    Spouse name: Not on file  . Number of children: 2  . Years of education: Not on file  . Highest education level: Not on file  Occupational History  . Occupation: PBX IT sales professional: [employer]  Tobacco Use  . Smoking status: Former Smoker    Packs/day: 0.30    Types: Cigarettes    Quit date: 08/29/1998    Years since quitting: 21.2  . Smokeless tobacco: Never Used  . Tobacco comment: Quit in 2000   Substance and Sexual Activity  . Alcohol use: Yes    Comment: socially  . Drug use: No  . Sexual activity: Yes    Birth control/protection: Surgical  Other Topics Concern  . Not on file  Social History Narrative   Works at [employer] as a Museum/gallery conservator   Social Determinants of Radio broadcast assistant Strain:   . Difficulty of Paying Living Expenses:   Food Insecurity:   . Worried About Charity fundraiser in the Last Year:   . Arboriculturist in the Last Year:   Transportation Needs:   . Film/video editor (Medical):   Marland Kitchen Lack  of Transportation (Non-Medical):   Physical Activity:   . Days of Exercise per Week:   . Minutes of Exercise per Session:   Stress:   . Feeling of Stress :   Social Connections:   . Frequency of Communication with Friends and Family:   . Frequency of Social Gatherings with Friends and Family:   . Attends Religious Services:   . Active Member of Clubs or Organizations:   . Attends Banker Meetings:   Marland Kitchen Marital Status:   Intimate Partner Violence:   . Fear of Current or Ex-Partner:   . Emotionally Abused:   Marland Kitchen Physically Abused:   . Sexually Abused:     Current Outpatient Medications on File Prior to Visit  Medication Sig Dispense Refill  . aspirin 81 MG tablet  Take 1 tablet (81 mg total) by mouth daily.    . Blood Glucose Monitoring Suppl (FREESTYLE LITE) DEVI 1 each by Does not apply route 2 (two) times daily. E11.9 1 each 0  . cetirizine (ZYRTEC) 10 MG tablet Take 1 tablet (10 mg total) by mouth daily. 30 tablet 11  . docusate sodium (COLACE) 100 MG capsule Take 1 capsule (100 mg total) by mouth 3 (three) times daily as needed. 20 capsule 0  . Dulaglutide (TRULICITY) 0.75 MG/0.5ML SOPN Inject 0.75 mg into the skin once a week. 4 pen 11  . fluticasone (FLONASE) 50 MCG/ACT nasal spray Place 2 sprays into both nostrils daily. 16 g 0  . glucose blood (FREESTYLE LITE) test strip 1 each by Other route 2 (two) times daily. E11.9 200 each 0  . Lancets (FREESTYLE) lancets 1 each by Other route 2 (two) times daily. E11.9 200 each 0  . lisinopril (ZESTRIL) 5 MG tablet Take 1 tablet (5 mg total) by mouth daily. 90 tablet 3  . meloxicam (MOBIC) 15 MG tablet Take 1 tablet (15 mg total) by mouth daily. 30 tablet 3  . Omega 3-6-9 Fatty Acids (TRIPLE OMEGA-3-6-9) CAPS Take 1 capsule by mouth daily.    . pantoprazole (PROTONIX) 20 MG tablet TAKE 1 TABLET BY MOUTH DAILY. 30 tablet PRN  . pregabalin (LYRICA) 150 MG capsule TAKE 1 CAPSULE BY MOUTH 2 TIMES DAILY. 60 capsule 0  . traMADol (ULTRAM) 50 MG tablet Take 1 tablet (50 mg total) by mouth every 6 (six) hours as needed. 15 tablet 0  . triamcinolone (NASACORT AQ) 55 MCG/ACT AERO nasal inhaler Place 2 sprays into the nose daily. 1 Inhaler 12  . UNIFINE PENTIPS 31G X 5 MM MISC USE FOR INSULIN INJECTION 3 TIMES PER DAY 100 each 8   No current facility-administered medications on file prior to visit.    Allergies  Allergen Reactions  . Sulfa Antibiotics Hives and Rash  . Citrus Itching  . Invokana [Canagliflozin] Other (See Comments)    Caused frequent vaginal yeast infections  . Codeine Hives  . Metformin And Related Diarrhea and Nausea And Vomiting  . Metoclopramide Hcl Nausea And Vomiting  . Penicillins  Nausea And Vomiting  . Propoxyphene Nausea And Vomiting    GI upset  . Propoxyphene N-Acetaminophen Nausea And Vomiting  . Sulfonamide Derivatives Hives    Family History  Problem Relation Age of Onset  . Breast cancer Mother   . Diabetes Mother   . Cancer Mother   . Ovarian cancer Maternal Grandmother   . Heart disease Maternal Grandmother        great  . Stomach cancer Cousin   . Diabetes Brother  x 3  . Alcohol abuse Father     BP 132/82 (BP Location: Left Arm, Patient Position: Sitting, Cuff Size: Large)   Pulse 85   Ht 5\' 5"  (1.651 m)   Wt 258 lb 6.4 oz (117.2 kg)   SpO2 96%   BMI 43.00 kg/m    Review of Systems Nausea is improved, but it persists.      Objective:   Physical Exam VITAL SIGNS:  See vs page GENERAL: no distress Pulses: dorsalis pedis intact bilat.   MSK: no deformity of the feet CV: trace bilat leg edema Skin:  no ulcer on the feet.  normal color and temp on the feet. Neuro: sensation is intact to touch on the feet.    A1c=6.5%  Lab Results  Component Value Date   CREATININE 0.76 09/07/2018   BUN 12 09/07/2018   NA 139 09/07/2018   K 4.1 09/07/2018   CL 106 09/07/2018   CO2 27 09/07/2018       Assessment & Plan:  Insulin-requiring type 2 DM, with PN.  Nausea: we can't increase Trulicity now. Hypoglycemia: this limits aggressiveness of glycemic control.    Patient Instructions  Please reduce the Tresiba to 75 units daily, and: continue the same Trulicity.  check your blood sugar twice a day.  vary the time of day when you check, between before the 3 meals, and at bedtime.  also check if you have symptoms of your blood sugar being too high or too low.  please keep a record of the readings and bring it to your next appointment here (or you can bring the meter itself).  You can write it on any piece of paper.  please call 11/06/2018 sooner if your blood sugar goes below 70, or if you have a lot of readings over 200. Please come back for  a follow-up appointment in 2-3 months.

## 2019-12-16 DIAGNOSIS — E1169 Type 2 diabetes mellitus with other specified complication: Secondary | ICD-10-CM | POA: Diagnosis not present

## 2019-12-16 DIAGNOSIS — R635 Abnormal weight gain: Secondary | ICD-10-CM | POA: Diagnosis not present

## 2019-12-16 DIAGNOSIS — Z72820 Sleep deprivation: Secondary | ICD-10-CM | POA: Diagnosis not present

## 2019-12-16 DIAGNOSIS — M130 Polyarthritis, unspecified: Secondary | ICD-10-CM | POA: Diagnosis not present

## 2019-12-16 DIAGNOSIS — I10 Essential (primary) hypertension: Secondary | ICD-10-CM | POA: Diagnosis not present

## 2019-12-25 MED FILL — TRULICITY 0.75 MG/0.5 ML PE: 0.75 | 28 days supply | Qty: 2 | Fill #3

## 2019-12-25 MED FILL — FREESTYLE LANCETS: 50 days supply | Qty: 100 | Fill #1

## 2019-12-30 DIAGNOSIS — E785 Hyperlipidemia, unspecified: Secondary | ICD-10-CM | POA: Diagnosis not present

## 2019-12-30 DIAGNOSIS — E1169 Type 2 diabetes mellitus with other specified complication: Secondary | ICD-10-CM | POA: Diagnosis not present

## 2019-12-30 DIAGNOSIS — I10 Essential (primary) hypertension: Secondary | ICD-10-CM | POA: Diagnosis not present

## 2020-01-03 MED FILL — TRESIBA FLEXTOUCH 100 UNITS: 100 | 60 days supply | Qty: 48 | Fill #3

## 2020-01-07 NOTE — Progress Notes (Signed)
Patient referred by Lucianne Lei, MD for hypertension, abnormal EKG  Subjective:   Bridget [name], female    DOB: Nov 22, 1960, 59 y.o.   MRN: [id number]   Chief Complaint  Patient presents with  . AV block  . Hypertension  . New Patient (Initial Visit)     HPI  59 y.o. 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.  She has controlled hypertension and diabetes.  She has limited physical mobility due to arthritis.  She reports dyspnea with climbing up the stairs or a hill.  This has been stable with no recent worsening.  She has occasional episodes of sharp pain under her right breast that lasts for 1 to 2 seconds.  She has not experienced any prolonged exertional chest pain symptoms.   Past Medical History:  Diagnosis Date  . Bacterial vaginosis 12/07/2010  . Depression   . Diabetes mellitus without complication ([place])   . Diverticulosis   . DIVERTICULOSIS, COLON 10/26/2006  . Fibromyalgia   . GERD (gastroesophageal reflux disease)   . Glaucoma   . Headache   . HEEL SPUR 09/10/2007   Qualifier: Diagnosis of  By: Drue Flirt  MD, Merrily Brittle    . Hypertension   . MALAISE AND FATIGUE 05/27/2010  . Obesity   . OBESITY, NOS 10/26/2006  . Osteoarthritis   . OSTEOARTHRITIS, KNEE 04/27/2010  . Retained tampon 05/21/2012  . SHOULDER PAIN, LEFT 05/19/2010   Qualifier: Diagnosis of  By: Annamary Carolin MD, Amber    . Vaginal yeast infection 12/08/2010     Past Surgical History:  Procedure Laterality Date  . ABDOMINAL HYSTERECTOMY  1990  . ABDOMINAL HYSTERECTOMY    . BLADDER SURGERY  2001  . CATARACT EXTRACTION Right 2001   with implant  . EYE SURGERY     implant rt eye  . ROTATOR CUFF REPAIR Right   . SHOULDER ARTHROSCOPY WITH SUBACROMIAL DECOMPRESSION Left 07/20/2015   Procedure: SHOULDER ARTHROSCOPY WITH DEBRIDEMENT OF ROTATOR CUFF AND SUBACROMIAL DECOMPRESSION  ;  Surgeon: Tania Ade, MD;  Location: [facility];  Service: Orthopedics;  Laterality: Left;  Left shoulder arthroscopy with debridement of rotator cuff and subacromial decompression     Social History   Tobacco Use  Smoking Status Former Smoker  . Packs/day: 0.30  . Years: 20.00  . Pack years: 6.00  . Types: Cigarettes  . Quit date: 08/29/1998  . Years since quitting: 21.3  Smokeless Tobacco Never Used  Tobacco Comment   Quit in 2000     Social History   Substance and Sexual Activity  Alcohol Use Yes   Comment: socially     Family History  Problem Relation Age of Onset  . Breast cancer Mother   . Diabetes Mother   . Cancer Mother   . Ovarian cancer Maternal Grandmother   . Heart disease Maternal Grandmother        great  . Stomach cancer Cousin   . Diabetes Brother        x 3  . Hypertension Brother   . Alcohol abuse Father   . Diabetes Brother   . Diabetes Brother   . Hypertension Brother   . Hypertension Sister   . Diabetes Sister      Current Outpatient Medications on File Prior to Visit  Medication Sig Dispense Refill  . aspirin 81 MG tablet Take 1 tablet (81  mg total) by mouth daily.    . Blood Glucose Monitoring Suppl (FREESTYLE LITE) DEVI 1 each by Does not apply route 2 (two) times daily. E11.9 1 each 0  . cetirizine (ZYRTEC) 10 MG tablet Take 1 tablet (10 mg total) by mouth daily. 30 tablet 11  . docusate sodium (COLACE) 100 MG capsule Take 1 capsule (100 mg total) by mouth 3 (three) times daily as needed. 20 capsule 0  . Dulaglutide (TRULICITY) 3.81 WE/9.9BZ SOPN Inject 0.75 mg into the skin once a week. 4 pen 11  . fluticasone (FLONASE) 50 MCG/ACT nasal spray Place 2 sprays into both nostrils daily. 16 g 0  . glucose blood (FREESTYLE LITE) test strip 1 each by Other route 2 (two) times daily. E11.9 200 each 0  . insulin degludec (TRESIBA FLEXTOUCH) 100 UNIT/ML FlexTouch Pen Inject 0.75 mLs (75 Units total) into the skin daily. 20  pen 3  . Lancets (FREESTYLE) lancets 1 each by Other route 2 (two) times daily. E11.9 200 each 0  . lisinopril (ZESTRIL) 5 MG tablet Take 1 tablet (5 mg total) by mouth daily. 90 tablet 3  . meloxicam (MOBIC) 15 MG tablet Take 1 tablet (15 mg total) by mouth daily. 30 tablet 3  . Omega 3-6-9 Fatty Acids (TRIPLE OMEGA-3-6-9) CAPS Take 1 capsule by mouth daily.    . pantoprazole (PROTONIX) 20 MG tablet TAKE 1 TABLET BY MOUTH DAILY. 30 tablet PRN  . pregabalin (LYRICA) 150 MG capsule TAKE 1 CAPSULE BY MOUTH 2 TIMES DAILY. 60 capsule 0  . ROCKLATAN 0.02-0.005 % SOLN SMARTSIG:1 Drop(s) In Eye(s) Every Evening    . traMADol (ULTRAM) 50 MG tablet Take 1 tablet (50 mg total) by mouth every 6 (six) hours as needed. 15 tablet 0  . triamcinolone (NASACORT AQ) 55 MCG/ACT AERO nasal inhaler Place 2 sprays into the nose daily. 1 Inhaler 12  . UNIFINE PENTIPS 31G X 5 MM MISC USE FOR INSULIN INJECTION 3 TIMES PER DAY 100 each 8   No current facility-administered medications on file prior to visit.    Cardiovascular and other pertinent studies:   EKG 01/09/2020: Sinus rhythm 92 bpm. Borderline left atrial enlargement.  Poor R wave progression.   Recent labs: 12/16/2019: Glucose 88, BUN/Cr 17/0.73. EGFR 106. Na/K 140/4.4. Rest of the CMP normal H/H 12.6/38.0. MCV 86.0. Platelets 223 HbA1C 6.1% Chol 164, TG 98, HDL 47, LDL 98 TSH 1.56 normal   Review of Systems  Cardiovascular: Positive for dyspnea on exertion. Negative for chest pain, leg swelling, palpitations and syncope.         Vitals:   01/09/20 0840  BP: 139/79  Pulse: 98  SpO2: 96%     Body mass index is 42.27 kg/m. Filed Weights   01/09/20 0840  Weight: 254 lb (115.2 kg)     Objective:   Physical Exam  Constitutional: No distress.  Neck: No JVD present.  Cardiovascular: Normal rate, regular rhythm, normal heart sounds and intact distal pulses.  No murmur heard. Pulmonary/Chest: Effort normal and breath sounds  normal. She has no wheezes. She has no rales.  Musculoskeletal:        General: No edema.  Nursing note and vitals reviewed.       Assessment & Recommendations:   59 y.o. African American female with hypertension, type 2 DM, obesity, referred for abnormal EKG with first degree AV block  Personally reviewed EKG.  PR interval is within normal limits and does not show definite evidence of AV block.  She has mild left atrial enlargement, which possibly is related to her underlying hypertension.  Recommend calcium score scan for stratification.  Recommend echocardiogram due to complaints of exertional dyspnea.  Continue aggressive risk factor modification, including diet and lifestyle changes.  If calcium score is 0, would be reasonable to stop aspirin.  If calcium score elevated, would consider adding statin.  Unless calcium score is in 700s, would be reasonable to omit aspirin given her ongoing use of meloxicam.  I will see her on as-needed basis.    Thank you for referring the patient to [place]. Please feel free to contact with any questions.  Nigel Mormon, MD [facility] Pager: [phone] Office: [phone]

## 2020-01-08 DIAGNOSIS — I440 Atrioventricular block, first degree: Secondary | ICD-10-CM | POA: Insufficient documentation

## 2020-01-09 ENCOUNTER — Encounter: Payer: Self-pay | Admitting: Cardiology

## 2020-01-09 ENCOUNTER — Ambulatory Visit: Payer: 59 | Admitting: Cardiology

## 2020-01-09 ENCOUNTER — Other Ambulatory Visit: Payer: Self-pay

## 2020-01-09 VITALS — BP 139/79 | HR 98 | Ht 65.0 in | Wt 254.0 lb

## 2020-01-09 DIAGNOSIS — I10 Essential (primary) hypertension: Secondary | ICD-10-CM | POA: Diagnosis not present

## 2020-01-09 DIAGNOSIS — R0609 Other forms of dyspnea: Secondary | ICD-10-CM

## 2020-01-09 DIAGNOSIS — E1165 Type 2 diabetes mellitus with hyperglycemia: Secondary | ICD-10-CM | POA: Diagnosis not present

## 2020-01-09 DIAGNOSIS — IMO0002 Reserved for concepts with insufficient information to code with codable children: Secondary | ICD-10-CM

## 2020-01-09 DIAGNOSIS — E118 Type 2 diabetes mellitus with unspecified complications: Secondary | ICD-10-CM | POA: Diagnosis not present

## 2020-01-09 DIAGNOSIS — R0600 Dyspnea, unspecified: Secondary | ICD-10-CM

## 2020-01-10 ENCOUNTER — Other Ambulatory Visit: Payer: Self-pay | Admitting: Endocrinology

## 2020-01-10 DIAGNOSIS — IMO0002 Reserved for concepts with insufficient information to code with codable children: Secondary | ICD-10-CM

## 2020-01-15 ENCOUNTER — Other Ambulatory Visit: Payer: Self-pay | Admitting: Internal Medicine

## 2020-01-15 MED FILL — PREGABALIN 150 MG CAPS: 150 | 30 days supply | Qty: 60 | Fill #0

## 2020-01-16 ENCOUNTER — Other Ambulatory Visit: Payer: Self-pay

## 2020-01-16 ENCOUNTER — Ambulatory Visit: Payer: 59

## 2020-01-16 DIAGNOSIS — R0609 Other forms of dyspnea: Secondary | ICD-10-CM

## 2020-01-16 DIAGNOSIS — H401121 Primary open-angle glaucoma, left eye, mild stage: Secondary | ICD-10-CM | POA: Diagnosis not present

## 2020-01-16 DIAGNOSIS — R0600 Dyspnea, unspecified: Secondary | ICD-10-CM

## 2020-01-16 DIAGNOSIS — I10 Essential (primary) hypertension: Secondary | ICD-10-CM | POA: Diagnosis not present

## 2020-01-16 DIAGNOSIS — H401113 Primary open-angle glaucoma, right eye, severe stage: Secondary | ICD-10-CM | POA: Diagnosis not present

## 2020-01-21 ENCOUNTER — Other Ambulatory Visit: Payer: 59

## 2020-01-22 MED FILL — TRULICITY 0.75 MG/0.5 ML PE: 0.75 | 28 days supply | Qty: 2 | Fill #4

## 2020-01-28 MED FILL — traMADol HCL 50 MG TABS: 50 | 30 days supply | Qty: 90 | Fill #0

## 2020-01-29 ENCOUNTER — Telehealth: Payer: Self-pay

## 2020-01-29 NOTE — Telephone Encounter (Signed)
Pt called to see if you want her to have a f/u please advise.

## 2020-01-29 NOTE — Telephone Encounter (Signed)
Echocardiogram does not show any significant abnormality. Calcium score is 0. No further cardiac workup required at this time. Continue follow up with PCP re: management of hypertension.  Thanks MJP

## 2020-01-29 NOTE — Telephone Encounter (Signed)
Called pt to inform her about the message above, but no answer, left a vm .

## 2020-03-10 ENCOUNTER — Ambulatory Visit (INDEPENDENT_AMBULATORY_CARE_PROVIDER_SITE_OTHER): Payer: Medicaid Other | Admitting: Endocrinology

## 2020-03-10 ENCOUNTER — Other Ambulatory Visit: Payer: Self-pay

## 2020-03-10 VITALS — BP 140/86 | HR 81 | Temp 98.8°F | Ht 65.0 in | Wt 253.2 lb

## 2020-03-10 DIAGNOSIS — E118 Type 2 diabetes mellitus with unspecified complications: Secondary | ICD-10-CM

## 2020-03-10 DIAGNOSIS — IMO0002 Reserved for concepts with insufficient information to code with codable children: Secondary | ICD-10-CM

## 2020-03-10 DIAGNOSIS — E1165 Type 2 diabetes mellitus with hyperglycemia: Secondary | ICD-10-CM

## 2020-03-10 LAB — POCT GLYCOSYLATED HEMOGLOBIN (HGB A1C): Hemoglobin A1C: 7.1 % — AB (ref 4.0–5.6)

## 2020-03-10 MED ORDER — TRULICITY 0.75 MG/0.5ML ~~LOC~~ SOAJ: 0.7500 mg | SUBCUTANEOUS | 3 refills | Status: DC

## 2020-03-10 MED ORDER — TRESIBA FLEXTOUCH 100 UNIT/ML ~~LOC~~ SOPN: 70.0000 [IU] | PEN_INJECTOR | Freq: Every day | SUBCUTANEOUS | 3 refills | Status: DC

## 2020-03-10 NOTE — Progress Notes (Signed)
Subjective:    Patient ID: Bridget [name], female    DOB: 1960-09-20, 59 y.o.   MRN: [id number]  HPI Pt returns for f/u of diabetes mellitus: DM type: Insulin-requiring type 2.  Dx'ed: 2000 Complications: PN Therapy: insulin and Trulicity.   GDM: never.  DKA: never.   Severe hypoglycemia: never.   Pancreatitis: never.  SDOH: she works 3rd shift.  Other: she did not tolerate invokana (vaginitis) or metformin-XR (diarrhea); edema precludes pioglitizone rx.  she also took insulin from 2000-2003; she resumed in 2017, She declines multiple daily injections.   Interval history: Meter is downloaded today, and the printout is scanned into the record. cbg varies from 179-199.  It is in general higher as the day goes on.  She stopped Trulicity.  She has a new pharmacy.   Past Medical History:  Diagnosis Date  . Bacterial vaginosis 12/07/2010  . Depression   . Diabetes mellitus without complication (HCC)   . Diverticulosis   . DIVERTICULOSIS, COLON 10/26/2006  . Fibromyalgia   . GERD (gastroesophageal reflux disease)   . Glaucoma   . Headache   . HEEL SPUR 09/10/2007   Qualifier: Diagnosis of  By: Lanier Prude  MD, Cathrine Muster    . Hypertension   . MALAISE AND FATIGUE 05/27/2010  . Obesity   . OBESITY, NOS 10/26/2006  . Osteoarthritis   . OSTEOARTHRITIS, KNEE 04/27/2010  . Retained tampon 05/21/2012  . SHOULDER PAIN, LEFT 05/19/2010   Qualifier: Diagnosis of  By: Clotilde Dieter MD, Amber    . Vaginal yeast infection 12/08/2010    Past Surgical History:  Procedure Laterality Date  . ABDOMINAL HYSTERECTOMY  1990  . ABDOMINAL HYSTERECTOMY    . BLADDER SURGERY  2001  . CATARACT EXTRACTION Right 2001   with implant  . EYE SURGERY     implant rt eye  . ROTATOR CUFF REPAIR Right   . SHOULDER ARTHROSCOPY WITH SUBACROMIAL DECOMPRESSION Left 07/20/2015   Procedure: SHOULDER ARTHROSCOPY WITH DEBRIDEMENT OF ROTATOR CUFF AND SUBACROMIAL DECOMPRESSION ;  Surgeon: Jones Broom, MD;  Location: [facility];  Service: Orthopedics;  Laterality: Left;  Left shoulder arthroscopy with debridement of rotator cuff and subacromial decompression    Social History   Socioeconomic History  . Marital status: Divorced    Spouse name: Not on file  . Number of children: 2  . Years of education: Not on file  . Highest education level: Not on file  Occupational History  . 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 Smoking status: Former Smoker    Packs/day: 0.30    Years: 20.00    Pack years: 6.00    Types: Cigarettes    Quit date: 08/29/1998    Years since quitting: 21.5  . Smokeless tobacco: Never Used  . Tobacco comment: Quit in 2000   Vaping Use  . Vaping Use: Never used  Substance and Sexual Activity  . Alcohol use: Yes    Comment: socially  . Drug use: No  . Sexual activity: Yes    Birth control/protection: Surgical  Other Topics Concern  . 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 Difficulty of Paying Living Expenses:   Food Insecurity:   . Worried About Programme researcher, broadcasting/film/video in the Last Year:   . [employer] of Food in the Last Year:  Transportation Needs:   . Freight forwarder (Medical):   Marland Kitchen Lack of Transportation (Non-Medical):   Physical Activity:   . Days of Exercise per Week:   . Minutes of Exercise per Session:   Stress:   . Feeling of Stress :   Social Connections:   . Frequency of Communication with Friends and Family:   . Frequency of Social Gatherings with Friends and Family:   . Attends Religious Services:   . Active Member of Clubs or Organizations:   . Attends Banker Meetings:   Marland Kitchen Marital Status:   Intimate Partner Violence:   . Fear of Current or Ex-Partner:   . Emotionally Abused:   Marland Kitchen Physically Abused:   . Sexually Abused:     Current Outpatient Medications on File Prior to Visit  Medication Sig  Dispense Refill  . Blood Glucose Monitoring Suppl (FREESTYLE LITE) DEVI 1 each by Does not apply route 2 (two) times daily. E11.9 1 each 0  . Lancets (FREESTYLE) lancets USE TO CHECK BLOOD SUGAR TWICE DAILY. 100 each 0  . UNIFINE PENTIPS 31G X 5 MM MISC USE FOR INSULIN INJECTION 3 TIMES PER DAY 100 each 8  . aspirin 81 MG tablet Take 1 tablet (81 mg total) by mouth daily.    . cetirizine (ZYRTEC) 10 MG tablet Take 1 tablet (10 mg total) by mouth daily. 30 tablet 11  . docusate sodium (COLACE) 100 MG capsule Take 1 capsule (100 mg total) by mouth 3 (three) times daily as needed. 20 capsule 0  . fluticasone (FLONASE) 50 MCG/ACT nasal spray Place 2 sprays into both nostrils daily. 16 g 0  . FREESTYLE LITE test strip TEST 2 TIMES DAILY E11.9 100 strip 0  . lisinopril (ZESTRIL) 5 MG tablet Take 1 tablet (5 mg total) by mouth daily. 90 tablet 3  . meloxicam (MOBIC) 15 MG tablet Take 1 tablet (15 mg total) by mouth daily. 30 tablet 3  . Omega 3-6-9 Fatty Acids (TRIPLE OMEGA-3-6-9) CAPS Take 1 capsule by mouth daily.    . pantoprazole (PROTONIX) 20 MG tablet TAKE 1 TABLET BY MOUTH DAILY. 90 tablet 3  . pregabalin (LYRICA) 150 MG capsule TAKE 1 CAPSULE BY MOUTH 2 TIMES DAILY. 60 capsule 0  . ROCKLATAN 0.02-0.005 % SOLN SMARTSIG:1 Drop(s) In Eye(s) Every Evening    . traMADol (ULTRAM) 50 MG tablet Take 1 tablet (50 mg total) by mouth every 6 (six) hours as needed. 15 tablet 0  . triamcinolone (NASACORT AQ) 55 MCG/ACT AERO nasal inhaler Place 2 sprays into the nose daily. 1 Inhaler 12   No current facility-administered medications on file prior to visit.    Allergies  Allergen Reactions  . Sulfa Antibiotics Hives and Rash  . Citrus Itching  . Invokana [Canagliflozin] Other (See Comments)    Caused frequent vaginal yeast infections  . Codeine Hives  . Metformin And Related Diarrhea and Nausea And Vomiting  . Metoclopramide Hcl Nausea And Vomiting  . Penicillins Nausea And Vomiting  . Propoxyphene  Nausea And Vomiting    GI upset  . Propoxyphene N-Acetaminophen Nausea And Vomiting  . Sulfonamide Derivatives Hives    Family History  Problem Relation Age of Onset  . Breast cancer Mother   . Diabetes Mother   . Cancer Mother   . Ovarian cancer Maternal Grandmother   . Heart disease Maternal Grandmother        great  . Stomach cancer Cousin   . Diabetes Brother  x 3  . Hypertension Brother   . Alcohol abuse Father   . Diabetes Brother   . Diabetes Brother   . Hypertension Brother   . Hypertension Sister   . Diabetes Sister     BP 140/86 (BP Location: Left Arm, Patient Position: Sitting)   Pulse 81   Temp 98.8 F (37.1 C)   Ht 5\' 5"  (1.651 m)   Wt 253 lb 3.2 oz (114.9 kg)   SpO2 95%   BMI 42.13 kg/m    Review of Systems She has mild nausea.    Objective:   Physical Exam VITAL SIGNS:  See vs page GENERAL: no distress Pulses: dorsalis pedis intact bilat.   MSK: no deformity of the feet CV: trace bilat leg edema Skin:  no ulcer on the feet.  normal color and temp on the feet. Neuro: sensation is intact to touch on the feet.    A1c=7.1%    Assessment & Plan:  Insulin-requiring type 2 DM, with PN.  She would benefit from increased rx, if it can be done with a regimen that avoids or minimizes hypoglycemia. Obesity: she needs to resume Trulicity.  Patient Instructions  Please reduce the Tresiba to 70 units daily, and: Resume the Trulicity. I have sent a prescription to your pharmacy.   check your blood sugar twice a day.  vary the time of day when you check, between before the 3 meals, and at bedtime.  also check if you have symptoms of your blood sugar being too high or too low.  please keep a record of the readings and bring it to your next appointment here (or you can bring the meter itself).  You can write it on any piece of paper.  please call sooner if your blood sugar goes below 70, or if you have a lot of readings over 200. Please come back for  a follow-up appointment in 2 months.

## 2020-03-10 NOTE — Patient Instructions (Addendum)
Please reduce the Tresiba to 70 units daily, and: Resume the Trulicity. I have sent a prescription to your pharmacy.   check your blood sugar twice a day.  vary the time of day when you check, between before the 3 meals, and at bedtime.  also check if you have symptoms of your blood sugar being too high or too low.  please keep a record of the readings and bring it to your next appointment here (or you can bring the meter itself).  You can write it on any piece of paper.  please call us sooner if your blood sugar goes below 70, or if you have a lot of readings over 200. Please come back for a follow-up appointment in 2 months.

## 2020-03-16 ENCOUNTER — Telehealth: Payer: Self-pay | Admitting: Endocrinology

## 2020-03-16 NOTE — Telephone Encounter (Signed)
This means she needs to try something other than Trulicity first and fail that drug in order to qualify for Trulicity.

## 2020-03-16 NOTE — Telephone Encounter (Signed)
I received paper saying Trulicity needed "step therapy."  Does this mean she needs to increase the Trulicity after 4 weeks, or another drug needs to be tried first.

## 2020-03-16 NOTE — Telephone Encounter (Signed)
OK, please do PA.  Thank you

## 2020-03-17 NOTE — Telephone Encounter (Signed)
PA will be completed. 

## 2020-03-18 NOTE — Telephone Encounter (Signed)
Called [facility] and initiated [place]. PA was approved.  Approved from 03/18/2020 through 03/13/2021. PA #: [id number] Interactive Caller ID #: [id number]

## 2020-04-17 ENCOUNTER — Other Ambulatory Visit: Payer: Self-pay | Admitting: Internal Medicine

## 2020-05-11 DIAGNOSIS — B009 Herpesviral infection, unspecified: Secondary | ICD-10-CM | POA: Insufficient documentation

## 2020-05-11 DIAGNOSIS — N951 Menopausal and female climacteric states: Secondary | ICD-10-CM | POA: Insufficient documentation

## 2020-05-12 ENCOUNTER — Other Ambulatory Visit: Payer: Self-pay

## 2020-05-12 ENCOUNTER — Ambulatory Visit: Payer: 59 | Admitting: Endocrinology

## 2020-05-12 ENCOUNTER — Encounter: Payer: Self-pay | Admitting: Endocrinology

## 2020-05-12 VITALS — BP 116/68 | HR 83 | Ht 65.0 in | Wt 247.0 lb

## 2020-05-12 DIAGNOSIS — E1165 Type 2 diabetes mellitus with hyperglycemia: Secondary | ICD-10-CM

## 2020-05-12 DIAGNOSIS — E118 Type 2 diabetes mellitus with unspecified complications: Secondary | ICD-10-CM | POA: Diagnosis not present

## 2020-05-12 DIAGNOSIS — IMO0002 Reserved for concepts with insufficient information to code with codable children: Secondary | ICD-10-CM

## 2020-05-12 LAB — POCT GLYCOSYLATED HEMOGLOBIN (HGB A1C): Hemoglobin A1C: 6.9 % — AB (ref 4.0–5.6)

## 2020-05-12 MED ORDER — TRESIBA FLEXTOUCH 100 UNIT/ML ~~LOC~~ SOPN: 50.0000 [IU] | PEN_INJECTOR | Freq: Every day | SUBCUTANEOUS | 3 refills | Status: DC

## 2020-05-12 MED ORDER — TRULICITY 1.5 MG/0.5ML ~~LOC~~ SOAJ: 1.5000 mg | SUBCUTANEOUS | 3 refills | Status: DC

## 2020-05-12 NOTE — Progress Notes (Signed)
Subjective:    Patient ID: Bridget [name], female    DOB: 01/04/1961, 59 y.o.   MRN: [id number]  HPI Pt returns for f/u of diabetes mellitus: DM type: Insulin-requiring type 2.  Dx'ed: 2000 Complications: PN Therapy: insulin and Trulicity.   GDM: never.  DKA: never.   Severe hypoglycemia: never.   Pancreatitis: never.  SDOH: she works 3rd shift.  Other: she did not tolerate invokana (vaginitis) or metformin-XR (diarrhea); edema precludes pioglitizone rx.  she also took insulin from 2000-2003; she resumed in 2017, She declines multiple daily injections.   Interval history: she brings her meter with her cbg's which I have reviewed today. cbg varies from 80-225.  pt states she feels well in general. Past Medical History:  Diagnosis Date  . Bacterial vaginosis 12/07/2010  . Depression   . Diabetes mellitus without complication (HCC)   . Diverticulosis   . DIVERTICULOSIS, COLON 10/26/2006  . Fibromyalgia   . GERD (gastroesophageal reflux disease)   . Glaucoma   . Headache   . HEEL SPUR 09/10/2007   Qualifier: Diagnosis of  By: Lanier Prude  MD, Cathrine Muster    . Hypertension   . MALAISE AND FATIGUE 05/27/2010  . Obesity   . OBESITY, NOS 10/26/2006  . Osteoarthritis   . OSTEOARTHRITIS, KNEE 04/27/2010  . Retained tampon 05/21/2012  . SHOULDER PAIN, LEFT 05/19/2010   Qualifier: Diagnosis of  By: Clotilde Dieter MD, Amber    . Vaginal yeast infection 12/08/2010    Past Surgical History:  Procedure Laterality Date  . ABDOMINAL HYSTERECTOMY  1990  . ABDOMINAL HYSTERECTOMY    . BLADDER SURGERY  2001  . CATARACT EXTRACTION Right 2001   with implant  . EYE SURGERY     implant rt eye  . ROTATOR CUFF REPAIR Right   . SHOULDER ARTHROSCOPY WITH SUBACROMIAL DECOMPRESSION Left 07/20/2015   Procedure: SHOULDER ARTHROSCOPY WITH DEBRIDEMENT OF ROTATOR CUFF AND SUBACROMIAL DECOMPRESSION ;  Surgeon: Jones Broom, MD;  Location:  [facility];  Service: Orthopedics;  Laterality: Left;  Left  shoulder arthroscopy with debridement of rotator cuff and subacromial decompression    Social History   Socioeconomic History  . Marital status: Divorced    Spouse name: Not on file  . Number of children: 2  . Years of education: Not on file  . Highest education level: Not on file  Occupational History  . Occupation: PBX Nutritional therapist: [employer]  Tobacco Use  . Smoking status: Former Smoker    Packs/day: 0.30    Years: 20.00    Pack years: 6.00    Types: Cigarettes    Quit date: 08/29/1998    Years since quitting: 21.7  . Smokeless tobacco: Never Used  . Tobacco comment: Quit in 2000   Vaping Use  . Vaping Use: Never used  Substance and Sexual Activity  . Alcohol use: Yes    Comment: socially  . Drug use: No  . Sexual activity: Yes    Birth control/protection: Surgical  Other Topics Concern  . 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 Difficulty of Paying Living Expenses: Not on file  Food Insecurity:   . Worried About Programme researcher, broadcasting/film/video in the Last Year: Not on file  . Ran Out of Food in the Last Year: Not on file  Transportation Needs:   . Lack of  Transportation (Medical): Not on file  . Lack of Transportation (Non-Medical): Not on file  Physical Activity:   . Days of Exercise per Week: Not on file  . Minutes of Exercise per Session: Not on file  Stress:   . Feeling of Stress : Not on file  Social Connections:   . Frequency of Communication with Friends and Family: Not on file  . Frequency of Social Gatherings with Friends and Family: Not on file  . Attends Religious Services: Not on file  . Active Member of Clubs or Organizations: Not on file  . Attends Banker Meetings: Not on file  . Marital Status: Not on file  Intimate Partner Violence:   . Fear of Current or Ex-Partner: Not on file  . Emotionally Abused: Not on file  .  Physically Abused: Not on file  . Sexually Abused: Not on file    Current Outpatient Medications on File Prior to Visit  Medication Sig Dispense Refill  . aspirin 81 MG tablet Take 1 tablet (81 mg total) by mouth daily.    . Blood Glucose Monitoring Suppl (FREESTYLE LITE) DEVI 1 each by Does not apply route 2 (two) times daily. E11.9 1 each 0  . cetirizine (ZYRTEC) 10 MG tablet Take 1 tablet (10 mg total) by mouth daily. 30 tablet 11  . docusate sodium (COLACE) 100 MG capsule Take 1 capsule (100 mg total) by mouth 3 (three) times daily as needed. 20 capsule 0  . fluticasone (FLONASE) 50 MCG/ACT nasal spray Place 2 sprays into both nostrils daily. 16 g 0  . FREESTYLE LITE test strip TEST 2 TIMES DAILY E11.9 100 strip 0  . Lancets (FREESTYLE) lancets USE TO CHECK BLOOD SUGAR TWICE DAILY. 100 each 0  . lisinopril (ZESTRIL) 5 MG tablet Take 1 tablet (5 mg total) by mouth daily. 90 tablet 3  . meloxicam (MOBIC) 15 MG tablet TAKE 1 TABLET BY MOUTH EVERY DAY 30 tablet 2  . Omega 3-6-9 Fatty Acids (TRIPLE OMEGA-3-6-9) CAPS Take 1 capsule by mouth daily.    . pantoprazole (PROTONIX) 20 MG tablet TAKE 1 TABLET BY MOUTH DAILY. 90 tablet 3  . pregabalin (LYRICA) 150 MG capsule TAKE 1 CAPSULE BY MOUTH 2 TIMES DAILY. 60 capsule 0  . ROCKLATAN 0.02-0.005 % SOLN SMARTSIG:1 Drop(s) In Eye(s) Every Evening    . traMADol (ULTRAM) 50 MG tablet Take 1 tablet (50 mg total) by mouth every 6 (six) hours as needed. 15 tablet 0  . triamcinolone (NASACORT AQ) 55 MCG/ACT AERO nasal inhaler Place 2 sprays into the nose daily. 1 Inhaler 12  . UNIFINE PENTIPS 31G X 5 MM MISC USE FOR INSULIN INJECTION 3 TIMES PER DAY 100 each 8   No current facility-administered medications on file prior to visit.    Allergies  Allergen Reactions  . Sulfa Antibiotics Hives and Rash  . Citrus Itching  . Invokana [Canagliflozin] Other (See Comments)    Caused frequent vaginal yeast infections  . Codeine Hives  . Metformin And Related  Diarrhea and Nausea And Vomiting  . Metoclopramide Hcl Nausea And Vomiting  . Penicillins Nausea And Vomiting  . Propoxyphene Nausea And Vomiting    GI upset  . Propoxyphene N-Acetaminophen Nausea And Vomiting  . Sulfonamide Derivatives Hives    Family History  Problem Relation Age of Onset  . Breast cancer Mother   . Diabetes Mother   . Cancer Mother   . Ovarian cancer Maternal Grandmother   . Heart disease Maternal Grandmother  great  . Stomach cancer Cousin   . Diabetes Brother        x 3  . Hypertension Brother   . Alcohol abuse Father   . Diabetes Brother   . Diabetes Brother   . Hypertension Brother   . Hypertension Sister   . Diabetes Sister     BP 116/68   Pulse 83   Ht 5\' 5"  (1.651 m)   Wt 247 lb (112 kg)   SpO2 98%   BMI 41.10 kg/m    Review of Systems Denies nausea.      Objective:   Physical Exam VITAL SIGNS:  See vs page GENERAL: no distress Pulses: dorsalis pedis intact bilat.   MSK: no deformity of the feet CV: trace bilat leg edema Skin:  no ulcer on the feet.  normal color and temp on the feet. Neuro: sensation is intact to touch on the feet.     Lab Results  Component Value Date   HGBA1C 6.9 (A) 05/12/2020       Assessment & Plan:  Insulin-requiring type 2 DM Obesity: in this setting, we'll favor Trulicity.  Patient Instructions  Please reduce the Tresiba to 50 units daily, and: double the Trulicity. I have sent a prescription to your pharmacy.   check your blood sugar twice a day.  vary the time of day when you check, between before the 3 meals, and at bedtime.  also check if you have symptoms of your blood sugar being too high or too low.  please keep a record of the readings and bring it to your next appointment here (or you can bring the meter itself).  You can write it on any piece of paper.  please call 05/14/2020 sooner if your blood sugar goes below 70, or if you have a lot of readings over 200. Please come back for a follow-up  appointment in 2 months.

## 2020-05-12 NOTE — Patient Instructions (Addendum)
Please reduce the Tresiba to 50 units daily, and: double the Trulicity. I have sent a prescription to your pharmacy.   check your blood sugar twice a day.  vary the time of day when you check, between before the 3 meals, and at bedtime.  also check if you have symptoms of your blood sugar being too high or too low.  please keep a record of the readings and bring it to your next appointment here (or you can bring the meter itself).  You can write it on any piece of paper.  please call us sooner if your blood sugar goes below 70, or if you have a lot of readings over 200. Please come back for a follow-up appointment in 2 months.

## 2020-05-30 ENCOUNTER — Other Ambulatory Visit: Payer: Self-pay | Admitting: Endocrinology

## 2020-06-01 ENCOUNTER — Other Ambulatory Visit: Payer: Self-pay | Admitting: Family Medicine

## 2020-06-01 ENCOUNTER — Other Ambulatory Visit (HOSPITAL_COMMUNITY)
Admission: RE | Admit: 2020-06-01 | Discharge: 2020-06-01 | Disposition: A | Discharge status: Home / Self Care | Payer: 59 | Source: Ambulatory Visit | Attending: Family Medicine | Admitting: Family Medicine

## 2020-06-01 DIAGNOSIS — N898 Other specified noninflammatory disorders of vagina: Secondary | ICD-10-CM | POA: Insufficient documentation

## 2020-06-02 LAB — MOLECULAR ANCILLARY ONLY
Bacterial Vaginitis (gardnerella): NEGATIVE
Candida Glabrata: NEGATIVE
Candida Vaginitis: NEGATIVE
Chlamydia: NEGATIVE
Comment: NEGATIVE
Comment: NEGATIVE
Comment: NEGATIVE
Comment: NEGATIVE
Comment: NEGATIVE
Comment: NORMAL
Neisseria Gonorrhea: NEGATIVE
Trichomonas: POSITIVE — AB

## 2020-06-03 ENCOUNTER — Other Ambulatory Visit: Payer: Self-pay | Admitting: Endocrinology

## 2020-07-13 ENCOUNTER — Other Ambulatory Visit: Payer: Self-pay | Admitting: Endocrinology

## 2020-07-13 DIAGNOSIS — E1165 Type 2 diabetes mellitus with hyperglycemia: Secondary | ICD-10-CM

## 2020-07-13 DIAGNOSIS — IMO0002 Reserved for concepts with insufficient information to code with codable children: Secondary | ICD-10-CM

## 2020-07-14 ENCOUNTER — Encounter: Payer: Self-pay | Admitting: Endocrinology

## 2020-07-14 ENCOUNTER — Ambulatory Visit: Payer: 59 | Admitting: Endocrinology

## 2020-07-14 ENCOUNTER — Other Ambulatory Visit: Payer: Self-pay

## 2020-07-14 VITALS — BP 126/82 | HR 73 | Ht 65.0 in | Wt 238.2 lb

## 2020-07-14 DIAGNOSIS — E118 Type 2 diabetes mellitus with unspecified complications: Secondary | ICD-10-CM

## 2020-07-14 DIAGNOSIS — E1165 Type 2 diabetes mellitus with hyperglycemia: Secondary | ICD-10-CM

## 2020-07-14 DIAGNOSIS — IMO0002 Reserved for concepts with insufficient information to code with codable children: Secondary | ICD-10-CM

## 2020-07-14 LAB — POCT GLYCOSYLATED HEMOGLOBIN (HGB A1C): Hemoglobin A1C: 6 % — AB (ref 4.0–5.6)

## 2020-07-14 MED ORDER — TRESIBA FLEXTOUCH 100 UNIT/ML ~~LOC~~ SOPN: 40.0000 [IU] | PEN_INJECTOR | Freq: Every day | SUBCUTANEOUS | 3 refills | Status: DC

## 2020-07-14 NOTE — Progress Notes (Signed)
Subjective:    Patient ID: Bridget [name], female    DOB: 1961/08/01, 59 y.o.   MRN: [id number]  HPI Pt returns for f/u of diabetes mellitus: DM type: Insulin-requiring type 2.  Dx'ed: 2000 Complications: PN Therapy: insulin and Trulicity.   GDM: never.  DKA: never.   Severe hypoglycemia: never.   Pancreatitis: never.  SDOH: she works 3rd shift.  Other: she did not tolerate invokana (vaginitis) or metformin-XR (diarrhea); edema precludes pioglitizone rx.  she also took insulin from 2000-2003; she resumed in 2017, She declines multiple daily injections.   Interval history: she brings her meter with her cbg's which I have reviewed today. cbg varies from 86-131.  pt states she feels well in general.   Past Medical History:  Diagnosis Date  . Bacterial vaginosis 12/07/2010  . Depression   . Diabetes mellitus without complication (HCC)   . Diverticulosis   . DIVERTICULOSIS, COLON 10/26/2006  . Fibromyalgia   . GERD (gastroesophageal reflux disease)   . Glaucoma   . Headache   . HEEL SPUR 09/10/2007   Qualifier: Diagnosis of  By: Lanier Prude  MD, Cathrine Muster    . Hypertension   . MALAISE AND FATIGUE 05/27/2010  . Obesity   . OBESITY, NOS 10/26/2006  . Osteoarthritis   . OSTEOARTHRITIS, KNEE 04/27/2010  . Retained tampon 05/21/2012  . SHOULDER PAIN, LEFT 05/19/2010   Qualifier: Diagnosis of  By: Clotilde Dieter MD, Amber    . Vaginal yeast infection 12/08/2010    Past Surgical History:  Procedure Laterality Date  . ABDOMINAL HYSTERECTOMY  1990  . ABDOMINAL HYSTERECTOMY    . BLADDER SURGERY  2001  . CATARACT EXTRACTION Right 2001   with implant  . EYE SURGERY     implant rt eye  . ROTATOR CUFF REPAIR Right   . SHOULDER ARTHROSCOPY WITH SUBACROMIAL DECOMPRESSION Left 07/20/2015   Procedure: SHOULDER ARTHROSCOPY WITH DEBRIDEMENT OF ROTATOR CUFF AND SUBACROMIAL DECOMPRESSION ;  Surgeon: Jones Broom, MD;  Location: [facility];  Service: Orthopedics;  Laterality: Left;  Left  shoulder arthroscopy with debridement of rotator cuff and subacromial decompression    Social History   Socioeconomic History  . Marital status: Divorced    Spouse name: Not on file  . Number of children: 2  . Years of education: Not on file  . Highest education level: Not on file  Occupational History  . Occupation: PBX Nutritional therapist: [place]  Tobacco Use  . Smoking status: Former Smoker    Packs/day: 0.30    Years: 20.00    Pack years: 6.00    Types: Cigarettes    Quit date: 08/29/1998    Years since quitting: 21.9  . Smokeless tobacco: Never Used  . Tobacco comment: Quit in 2000   Vaping Use  . Vaping Use: Never used  Substance and Sexual Activity  . Alcohol use: Yes    Comment: socially  . Drug use: No  . Sexual activity: Yes    Birth control/protection: Surgical  Other Topics Concern  . 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 Difficulty of Paying Living Expenses: Not on file  Food Insecurity:   . Worried About Programme researcher, broadcasting/film/video in the Last Year: Not on file  . Ran Out of Food in the Last Year: Not on file  Transportation Needs:   .  Lack of Transportation (Medical): Not on file  . Lack of Transportation (Non-Medical): Not on file  Physical Activity:   . Days of Exercise per Week: Not on file  . Minutes of Exercise per Session: Not on file  Stress:   . Feeling of Stress : Not on file  Social Connections:   . Frequency of Communication with Friends and Family: Not on file  . Frequency of Social Gatherings with Friends and Family: Not on file  . Attends Religious Services: Not on file  . Active Member of Clubs or Organizations: Not on file  . Attends Banker Meetings: Not on file  . Marital Status: Not on file  Intimate Partner Violence:   . Fear of Current or Ex-Partner: Not on file  . Emotionally Abused: Not on file  .  Physically Abused: Not on file  . Sexually Abused: Not on file    Current Outpatient Medications on File Prior to Visit  Medication Sig Dispense Refill  . aspirin 81 MG tablet Take 1 tablet (81 mg total) by mouth daily.    . B-D ULTRAFINE III SHORT PEN 31G X 8 MM MISC USE AS DIRECTED 4 TIMES A DAY 100 each 8  . Blood Glucose Monitoring Suppl (FREESTYLE LITE) DEVI 1 each by Does not apply route 2 (two) times daily. E11.9 1 each 0  . cetirizine (ZYRTEC) 10 MG tablet Take 1 tablet (10 mg total) by mouth daily. 30 tablet 11  . docusate sodium (COLACE) 100 MG capsule Take 1 capsule (100 mg total) by mouth 3 (three) times daily as needed. 20 capsule 0  . Dulaglutide (TRULICITY) 1.5 MG/0.5ML SOPN Inject 1.5 mg into the skin once a week. 6 mL 3  . fluticasone (FLONASE) 50 MCG/ACT nasal spray Place 2 sprays into both nostrils daily. 16 g 0  . FREESTYLE LITE test strip USE TO TEST TWICE A DAY 100 strip 0  . Lancets (FREESTYLE) lancets USE TO CHECK BLOOD SUGAR TWICE DAILY. 100 each 0  . lisinopril (ZESTRIL) 5 MG tablet Take 1 tablet (5 mg total) by mouth daily. 90 tablet 3  . meloxicam (MOBIC) 15 MG tablet TAKE 1 TABLET BY MOUTH EVERY DAY 30 tablet 2  . Omega 3-6-9 Fatty Acids (TRIPLE OMEGA-3-6-9) CAPS Take 1 capsule by mouth daily.    . pantoprazole (PROTONIX) 20 MG tablet TAKE 1 TABLET BY MOUTH DAILY. 90 tablet 3  . pregabalin (LYRICA) 150 MG capsule TAKE 1 CAPSULE BY MOUTH 2 TIMES DAILY. 60 capsule 0  . ROCKLATAN 0.02-0.005 % SOLN SMARTSIG:1 Drop(s) In Eye(s) Every Evening    . traMADol (ULTRAM) 50 MG tablet Take 1 tablet (50 mg total) by mouth every 6 (six) hours as needed. 15 tablet 0  . triamcinolone (NASACORT AQ) 55 MCG/ACT AERO nasal inhaler Place 2 sprays into the nose daily. 1 Inhaler 12  . UNIFINE PENTIPS 31G X 5 MM MISC USE FOR INSULIN INJECTION 3 TIMES PER DAY 100 each 8   No current facility-administered medications on file prior to visit.    Allergies  Allergen Reactions  . Sulfa  Antibiotics Hives and Rash  . Citrus Itching  . Invokana [Canagliflozin] Other (See Comments)    Caused frequent vaginal yeast infections  . Codeine Hives  . Metformin And Related Diarrhea and Nausea And Vomiting  . Metoclopramide Hcl Nausea And Vomiting  . Penicillins Nausea And Vomiting  . Propoxyphene Nausea And Vomiting    GI upset  . Propoxyphene N-Acetaminophen Nausea And Vomiting  .  Sulfonamide Derivatives Hives    Family History  Problem Relation Age of Onset  . Breast cancer Mother   . Diabetes Mother   . Cancer Mother   . Ovarian cancer Maternal Grandmother   . Heart disease Maternal Grandmother        great  . Stomach cancer Cousin   . Diabetes Brother        x 3  . Hypertension Brother   . Alcohol abuse Father   . Diabetes Brother   . Diabetes Brother   . Hypertension Brother   . Hypertension Sister   . Diabetes Sister     BP 126/82   Pulse 73   Ht 5\' 5"  (1.651 m)   Wt 238 lb 4 oz (108.1 kg)   SpO2 98%   BMI 39.65 kg/m    Review of Systems She denies hypoglycemia.      Objective:   Physical Exam VITAL SIGNS:  See vs page GENERAL: no distress Pulses: dorsalis pedis intact bilat.   MSK: no deformity of the feet CV: no leg edema Skin:  no ulcer on the feet.  normal color and temp on the feet. Neuro: sensation is intact to touch on the feet  Lab Results  Component Value Date   HGBA1C 6.0 (A) 07/14/2020       Assessment & Plan:  Insulin-requiring type 2 DM, with PN: overcontrolled.   Patient Instructions  Please reduce the Tresiba to 40 units daily, and:  Please continue the same Trulicity.  check your blood sugar twice a day.  vary the time of day when you check, between before the 3 meals, and at bedtime.  also check if you have symptoms of your blood sugar being too high or too low.  please keep a record of the readings and bring it to your next appointment here (or you can bring the meter itself).  You can write it on any piece of paper.   please call 07/16/2020 sooner if your blood sugar goes below 70, or if you have a lot of readings over 200.   Please come back for a follow-up appointment in 2 months.

## 2020-07-14 NOTE — Patient Instructions (Addendum)
Please reduce the Tresiba to 40 units daily, and:  Please continue the same Trulicity.  check your blood sugar twice a day.  vary the time of day when you check, between before the 3 meals, and at bedtime.  also check if you have symptoms of your blood sugar being too high or too low.  please keep a record of the readings and bring it to your next appointment here (or you can bring the meter itself).  You can write it on any piece of paper.  please call us sooner if your blood sugar goes below 70, or if you have a lot of readings over 200.   Please come back for a follow-up appointment in 2 months.

## 2020-07-24 ENCOUNTER — Other Ambulatory Visit: Payer: Self-pay | Admitting: Internal Medicine

## 2020-08-30 ENCOUNTER — Other Ambulatory Visit: Payer: Self-pay | Admitting: Internal Medicine

## 2020-09-15 ENCOUNTER — Ambulatory Visit: Payer: 59 | Admitting: Endocrinology

## 2020-09-22 ENCOUNTER — Other Ambulatory Visit: Payer: Self-pay | Admitting: Internal Medicine

## 2020-09-30 ENCOUNTER — Other Ambulatory Visit: Payer: Self-pay | Admitting: Endocrinology

## 2020-10-06 ENCOUNTER — Other Ambulatory Visit: Payer: Self-pay

## 2020-10-07 ENCOUNTER — Other Ambulatory Visit: Payer: Self-pay | Admitting: *Deleted

## 2020-10-07 DIAGNOSIS — E1165 Type 2 diabetes mellitus with hyperglycemia: Secondary | ICD-10-CM

## 2020-10-07 DIAGNOSIS — IMO0002 Reserved for concepts with insufficient information to code with codable children: Secondary | ICD-10-CM

## 2020-10-07 MED ORDER — BD PEN NEEDLE NANO 2ND GEN 32G X 4 MM MISC: 0 refills | Status: DC

## 2020-10-08 ENCOUNTER — Other Ambulatory Visit: Payer: Self-pay

## 2020-10-08 ENCOUNTER — Encounter: Payer: Self-pay | Admitting: Endocrinology

## 2020-10-08 ENCOUNTER — Telehealth (INDEPENDENT_AMBULATORY_CARE_PROVIDER_SITE_OTHER): Payer: 59 | Admitting: Endocrinology

## 2020-10-08 VITALS — Ht 65.5 in | Wt 230.0 lb

## 2020-10-08 DIAGNOSIS — E118 Type 2 diabetes mellitus with unspecified complications: Secondary | ICD-10-CM

## 2020-10-08 DIAGNOSIS — E1165 Type 2 diabetes mellitus with hyperglycemia: Secondary | ICD-10-CM | POA: Diagnosis not present

## 2020-10-08 DIAGNOSIS — IMO0002 Reserved for concepts with insufficient information to code with codable children: Secondary | ICD-10-CM

## 2020-10-08 MED ORDER — TRULICITY 0.75 MG/0.5ML ~~LOC~~ SOAJ: 0.7500 mg | SUBCUTANEOUS | 1 refills | Status: DC

## 2020-10-08 MED ORDER — TRESIBA FLEXTOUCH 100 UNIT/ML ~~LOC~~ SOPN: 35.0000 [IU] | PEN_INJECTOR | Freq: Every day | SUBCUTANEOUS | 3 refills | Status: DC

## 2020-10-08 NOTE — Patient Instructions (Addendum)
Please reduce the Tresiba to 35 units daily, and:  Please continue the same Trulicity.  check your blood sugar twice a day.  vary the time of day when you check, between before the 3 meals, and at bedtime.  also check if you have symptoms of your blood sugar being too high or too low.  please keep a record of the readings and bring it to your next appointment here (or you can bring the meter itself).  You can write it on any piece of paper.  please call us sooner if your blood sugar goes below 70, or if you have a lot of readings over 200.   Please come back for a follow-up appointment in 1-2 months.

## 2020-10-08 NOTE — Progress Notes (Signed)
Subjective:    Patient ID: Bridget [name], female    DOB: Jun 12, 1961, 60 y.o.   MRN: [id number]  HPI  telehealth visit today via telephone x 8 minutes.  Alternatives to telehealth are presented to this patient, and the patient agrees to the telehealth visit. Pt is advised of the cost of the visit, and agrees to this, also.   Patient is at home, and I am at the office.   Persons attending the telehealth visit: the patient and I Pt returns for f/u of diabetes mellitus: DM type: Insulin-requiring type 2.  Dx'ed: 2000 Complications: PN Therapy: insulin and Trulicity.   GDM: never.  DKA: never.   Severe hypoglycemia: never.   Pancreatitis: never.  SDOH: she works 3rd shift.  Other: she did not tolerate invokana (vaginitis) or metformin-XR (diarrhea); edema precludes pioglitizone rx.  she also took insulin from 2000-2003; she resumed in 2017, She declines multiple daily injections.   Interval history: pt says cbg varies from 76-131.  She checks fasting only.  pt states she feels well in general. She seldom has hypoglycemia, and these episodes are mild.  She says Trulicity is 0.75 mg QW.   Past Medical History:  Diagnosis Date  . Bacterial vaginosis 12/07/2010  . Depression   . Diabetes mellitus without complication (HCC)   . Diverticulosis   . DIVERTICULOSIS, COLON 10/26/2006  . Fibromyalgia   . GERD (gastroesophageal reflux disease)   . Glaucoma   . Headache   . HEEL SPUR 09/10/2007   Qualifier: Diagnosis of  By: Lanier Prude  MD, Cathrine Muster    . Hypertension   . MALAISE AND FATIGUE 05/27/2010  . Obesity   . OBESITY, NOS 10/26/2006  . Osteoarthritis   . OSTEOARTHRITIS, KNEE 04/27/2010  . Retained tampon 05/21/2012  . SHOULDER PAIN, LEFT 05/19/2010   Qualifier: Diagnosis of  By: Clotilde Dieter MD, Amber    . Vaginal yeast infection 12/08/2010    Past Surgical History:  Procedure Laterality Date  . ABDOMINAL HYSTERECTOMY  1990  . ABDOMINAL HYSTERECTOMY    . BLADDER SURGERY  2001  .  CATARACT EXTRACTION Right 2001   with implant  . EYE SURGERY     implant rt eye  . ROTATOR CUFF REPAIR Right   . SHOULDER ARTHROSCOPY WITH SUBACROMIAL DECOMPRESSION Left 07/20/2015   Procedure: SHOULDER ARTHROSCOPY WITH DEBRIDEMENT OF ROTATOR CUFF AND SUBACROMIAL DECOMPRESSION ;  Surgeon: Jones Broom, MD;  Location: [facility];  Service: Orthopedics;  Laterality: Left;  Left shoulder arthroscopy with debridement of rotator cuff and subacromial decompression    Social History   Socioeconomic History  . Marital status: Divorced    Spouse name: Not on file  . Number of children: 2  . Years of education: Not on file  . Highest education level: Not on file  Occupational History  . Occupation: PBX Nutritional therapist: [employer]  Tobacco Use  . Smoking status: Former Smoker    Packs/day: 0.30    Years: 20.00    Pack years: 6.00    Types: Cigarettes    Quit date: 08/29/1998    Years since quitting: 22.1  . Smokeless tobacco: Never Used  . Tobacco comment: Quit in 2000   Vaping Use  . Vaping Use: Never used  Substance and Sexual Activity  . Alcohol use: Yes    Comment: socially  . Drug use: No  . Sexual activity: Yes    Birth control/protection: Surgical  Other Topics Concern  . 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 aspirin 81 MG tablet Take 1 tablet (81 mg total) by mouth daily.    . Blood Glucose Monitoring Suppl (FREESTYLE LITE) DEVI 1 each by Does not apply route 2 (two) times daily. E11.9 1 each 0  . cetirizine (ZYRTEC) 10 MG tablet Take 1 tablet (10 mg  total) by mouth daily. 30 tablet 11  . docusate sodium (COLACE) 100 MG capsule Take 1 capsule (100 mg total) by mouth 3 (three) times daily as needed. 20 capsule 0  . fluticasone (FLONASE) 50 MCG/ACT nasal spray Place 2 sprays into both nostrils daily. 16 g 0  . FREESTYLE LITE test strip USE TO TEST TWICE A DAY 100 strip 0  . Insulin Pen Needle (BD PEN NEEDLE NANO 2ND GEN) 32G X 4 MM MISC USE AS DIRECTED 4 TIMES A DAY 100 each 0  . Lancets (FREESTYLE) lancets USE TO CHECK BLOOD SUGAR TWICE DAILY. 100 each 0  . lisinopril (ZESTRIL) 5 MG tablet Take 1 tablet (5 mg total) by mouth daily. 90 tablet 3  . meloxicam (MOBIC) 15 MG tablet TAKE 1 TABLET BY MOUTH EVERY DAY 30 tablet 0  . Omega 3-6-9 Fatty Acids (TRIPLE OMEGA-3-6-9) CAPS Take 1 capsule by mouth daily.    . pantoprazole (PROTONIX) 20 MG tablet TAKE 1 TABLET BY MOUTH DAILY. 90 tablet 3  . pregabalin (LYRICA) 150 MG capsule TAKE 1 CAPSULE BY MOUTH 2 TIMES DAILY. 60 capsule 0  . ROCKLATAN 0.02-0.005 % SOLN SMARTSIG:1 Drop(s) In Eye(s) Every Evening    . traMADol (ULTRAM) 50 MG tablet Take 1 tablet (50 mg total) by mouth every 6 (six) hours as needed. 15 tablet 0  . triamcinolone (NASACORT AQ) 55 MCG/ACT AERO nasal inhaler Place 2 sprays into the nose daily. 1 Inhaler 12   No current facility-administered medications on file prior to visit.    Allergies  Allergen Reactions  . Sulfa Antibiotics Hives and Rash  . Citrus Itching  . Invokana [Canagliflozin] Other (See Comments)    Caused frequent vaginal yeast infections  . Codeine Hives  . Metformin And Related Diarrhea and Nausea And Vomiting  . Metoclopramide Hcl Nausea And Vomiting  . Penicillins Nausea And Vomiting  . Propoxyphene Nausea And Vomiting    GI upset  . Propoxyphene N-Acetaminophen Nausea And Vomiting  . Sulfonamide Derivatives Hives    Family History  Problem Relation Age of Onset  . Breast cancer Mother   . Diabetes Mother   . Cancer Mother   . Ovarian cancer  Maternal Grandmother   . Heart disease Maternal Grandmother        great  . Stomach cancer Cousin   . Diabetes Brother        x 3  . Hypertension Brother   . Alcohol abuse Father   . Diabetes Brother   . Diabetes Brother   . Hypertension Brother   . Hypertension Sister   . Diabetes Sister     Ht 5' 5.5" (1.664 m)   Wt 230 lb (104.3 kg)  BMI 37.69 kg/m    Review of Systems     Objective:   Physical Exam      Assessment & Plan:  Insulin-requiring type 2 DM, with PN.   Hypoglycemia, due to insulin: this limits aggressiveness of glycemic control.     Patient Instructions  Please reduce the Tresiba to 35 units daily, and:  Please continue the same Trulicity.  check your blood sugar twice a day.  vary the time of day when you check, between before the 3 meals, and at bedtime.  also check if you have symptoms of your blood sugar being too high or too low.  please keep a record of the readings and bring it to your next appointment here (or you can bring the meter itself).  You can write it on any piece of paper.  please call us sooner if your blood sugar goes below 70, or if you have a lot of readings over 200.   Please come back for a follow-up appointment in 1-2 months.

## 2020-10-19 ENCOUNTER — Other Ambulatory Visit: Payer: Self-pay | Admitting: Internal Medicine

## 2020-11-03 ENCOUNTER — Ambulatory Visit (INDEPENDENT_AMBULATORY_CARE_PROVIDER_SITE_OTHER): Payer: 59 | Admitting: Podiatry

## 2020-11-03 ENCOUNTER — Other Ambulatory Visit: Payer: Self-pay

## 2020-11-03 DIAGNOSIS — M722 Plantar fascial fibromatosis: Secondary | ICD-10-CM

## 2020-11-03 DIAGNOSIS — E1149 Type 2 diabetes mellitus with other diabetic neurological complication: Secondary | ICD-10-CM | POA: Diagnosis not present

## 2020-11-03 NOTE — Patient Instructions (Signed)

## 2020-11-04 NOTE — Progress Notes (Signed)
Subjective:   Patient ID: Bridget [name], female   DOB: 60 y.o.   MRN: [id number]   HPI 60 year old female presents the office today for diabetic neuropathy, foot exam.  She denies any ulcerations.  She also states that she still gets some discomfort with bombers of her heel.  She has been wearing the orthotics but she is not sure if they are still helping as they are worn out.  She denies any recent injury or trauma.  She previously on gabapentin but she is currently on Lyrica for other issues.  Review of Systems  All other systems reviewed and are negative.  Past Medical History:  Diagnosis Date  . Bacterial vaginosis 12/07/2010  . Depression   . Diabetes mellitus without complication (HCC)   . Diverticulosis   . DIVERTICULOSIS, COLON 10/26/2006  . Fibromyalgia   . GERD (gastroesophageal reflux disease)   . Glaucoma   . Headache   . HEEL SPUR 09/10/2007   Qualifier: Diagnosis of  By: Lanier Prude  MD, Cathrine Muster    . Hypertension   . MALAISE AND FATIGUE 05/27/2010  . Obesity   . OBESITY, NOS 10/26/2006  . Osteoarthritis   . OSTEOARTHRITIS, KNEE 04/27/2010  . Retained tampon 05/21/2012  . SHOULDER PAIN, LEFT 05/19/2010   Qualifier: Diagnosis of  By: Clotilde Dieter MD, Amber    . Vaginal yeast infection 12/08/2010    Past Surgical History:  Procedure Laterality Date  . ABDOMINAL HYSTERECTOMY  1990  . ABDOMINAL HYSTERECTOMY    . BLADDER SURGERY  2001  . CATARACT EXTRACTION Right 2001   with implant  . EYE SURGERY     implant rt eye  . ROTATOR CUFF REPAIR Right   . SHOULDER ARTHROSCOPY WITH SUBACROMIAL DECOMPRESSION Left 07/20/2015   Procedure: SHOULDER ARTHROSCOPY WITH DEBRIDEMENT OF ROTATOR CUFF AND SUBACROMIAL DECOMPRESSION ;  Surgeon: Jones Broom, MD;  Location: [facility];  Service: Orthopedics;  Laterality: Left;  Left shoulder arthroscopy with debridement of rotator cuff and subacromial decompression     Current Outpatient Medications:  .  aspirin 81 MG tablet,  Take 1 tablet (81 mg total) by mouth daily., Disp: , Rfl:  .  Blood Glucose Monitoring Suppl (FREESTYLE LITE) DEVI, 1 each by Does not apply route 2 (two) times daily. E11.9, Disp: 1 each, Rfl: 0 .  cetirizine (ZYRTEC) 10 MG tablet, Take 1 tablet (10 mg total) by mouth daily., Disp: 30 tablet, Rfl: 11 .  docusate sodium (COLACE) 100 MG capsule, Take 1 capsule (100 mg total) by mouth 3 (three) times daily as needed., Disp: 20 capsule, Rfl: 0 .  Dulaglutide (TRULICITY) 0.75 MG/0.5ML SOPN, Inject 0.75 mg into the skin once a week., Disp: 6 mL, Rfl: 1 .  fluticasone (FLONASE) 50 MCG/ACT nasal spray, Place 2 sprays into both nostrils daily., Disp: 16 g, Rfl: 0 .  FREESTYLE LITE test strip, USE TO TEST TWICE A DAY, Disp: 100 strip, Rfl: 0 .  insulin degludec (TRESIBA FLEXTOUCH) 100 UNIT/ML FlexTouch Pen, Inject 35 Units into the skin daily., Disp: 40 mL, Rfl: 3 .  Insulin Pen Needle (BD PEN NEEDLE NANO 2ND GEN) 32G X 4 MM MISC, USE AS DIRECTED 4 TIMES A DAY, Disp: 100 each, Rfl: 0 .  Lancets (FREESTYLE) lancets, USE TO CHECK BLOOD SUGAR TWICE DAILY., Disp: 100 each, Rfl: 0 .  lisinopril (ZESTRIL) 5 MG tablet, Take 1 tablet (5 mg total) by mouth daily., Disp: 90 tablet, Rfl: 3 .  meloxicam (MOBIC) 15 MG tablet, TAKE 1  TABLET BY MOUTH EVERY DAY, Disp: 30 tablet, Rfl: 0 .  Omega 3-6-9 Fatty Acids (TRIPLE OMEGA-3-6-9) CAPS, Take 1 capsule by mouth daily., Disp: , Rfl:  .  pantoprazole (PROTONIX) 20 MG tablet, TAKE 1 TABLET BY MOUTH DAILY., Disp: 90 tablet, Rfl: 3 .  pregabalin (LYRICA) 150 MG capsule, TAKE 1 CAPSULE BY MOUTH 2 TIMES DAILY., Disp: 60 capsule, Rfl: 0 .  ROCKLATAN 0.02-0.005 % SOLN, SMARTSIG:1 Drop(s) In Eye(s) Every Evening, Disp: , Rfl:  .  traMADol (ULTRAM) 50 MG tablet, Take 1 tablet (50 mg total) by mouth every 6 (six) hours as needed., Disp: 15 tablet, Rfl: 0 .  triamcinolone (NASACORT AQ) 55 MCG/ACT AERO nasal inhaler, Place 2 sprays into the nose daily., Disp: 1 Inhaler, Rfl:  12  Allergies  Allergen Reactions  . Sulfa Antibiotics Hives and Rash  . Citrus Itching  . Invokana [Canagliflozin] Other (See Comments)    Caused frequent vaginal yeast infections  . Codeine Hives  . Metformin And Related Diarrhea and Nausea And Vomiting  . Metoclopramide Hcl Nausea And Vomiting  . Penicillins Nausea And Vomiting  . Propoxyphene Nausea And Vomiting    GI upset  . Propoxyphene N-Acetaminophen Nausea And Vomiting  . Sulfonamide Derivatives Hives          Objective:  Physical Exam  General: AAO x3, NAD  Dermatological: Skin is warm, dry and supple bilateral. There are no open sores, no preulcerative lesions, no rash or signs of infection present.  Vascular: Dorsalis Pedis artery and Posterior Tibial artery pedal pulses are 2/4 bilateral with immedate capillary fill time.  There is no pain with calf compression, swelling, warmth, erythema.   Neruologic: Grossly intact via light touch bilateral.  Sensation intact with Semmes Weinstein monofilament.  Vibratory sensation intact.  Musculoskeletal: There is mild discomfort on plantar medial tubercle of the calcaneus at the insertion of the plantar fascial bilaterally.  There is no pain with lateral compression of calcaneus.  No area pinpoint tenderness.  Flexor, extensor tendons appear to be intact.  Decreased medial arch height.  Muscular strength 5/5 in all groups tested bilateral.  Gait: Unassisted, Nonantalgic.       Assessment:   60 year old female with peripheral neuropathy, plan fasciitis    Plan:  -Treatment options discussed including all alternatives, risks, and complications -Etiology of symptoms were discussed  1.  Diabetic foot exam -No ulcerations identified and no significant swelling.  Discussed the importance daily foot inspection and continue glucose control.  2.  Peripheral neuropathy -Currently on Lyrica 150 mg.  She previously was on gabapentin.  We will continue current regiment for  now.  If symptoms worsen then need to consider changing medications.  3.  Plantar fasciitis -Encourage stretching, icing daily.  Discussed new orthotics.  Unfortunately her insurance is likely not to cover new inserts.  She is able to do an over-the-counter insert.  Discussed shoe modifications and more supportive shoes.  Vivi Barrack DPM

## 2020-11-17 ENCOUNTER — Other Ambulatory Visit: Payer: Self-pay | Admitting: Endocrinology

## 2020-11-17 DIAGNOSIS — IMO0002 Reserved for concepts with insufficient information to code with codable children: Secondary | ICD-10-CM

## 2020-11-17 DIAGNOSIS — E1165 Type 2 diabetes mellitus with hyperglycemia: Secondary | ICD-10-CM

## 2021-01-08 ENCOUNTER — Ambulatory Visit (INDEPENDENT_AMBULATORY_CARE_PROVIDER_SITE_OTHER): Payer: 59 | Admitting: Endocrinology

## 2021-01-08 ENCOUNTER — Other Ambulatory Visit: Payer: Self-pay

## 2021-01-08 VITALS — BP 140/76 | HR 76 | Ht 65.5 in | Wt 227.6 lb

## 2021-01-08 DIAGNOSIS — IMO0002 Reserved for concepts with insufficient information to code with codable children: Secondary | ICD-10-CM

## 2021-01-08 DIAGNOSIS — E1165 Type 2 diabetes mellitus with hyperglycemia: Secondary | ICD-10-CM | POA: Diagnosis not present

## 2021-01-08 DIAGNOSIS — E118 Type 2 diabetes mellitus with unspecified complications: Secondary | ICD-10-CM

## 2021-01-08 LAB — POCT GLYCOSYLATED HEMOGLOBIN (HGB A1C): Hemoglobin A1C: 5.9 % — AB (ref 4.0–5.6)

## 2021-01-08 MED ORDER — TRESIBA FLEXTOUCH 100 UNIT/ML ~~LOC~~ SOPN: 15.0000 [IU] | PEN_INJECTOR | Freq: Every day | SUBCUTANEOUS | 3 refills | Status: DC

## 2021-01-08 NOTE — Patient Instructions (Addendum)
Please reduce the Tresiba to 15 units daily, and:  Please continue the same Trulicity.  check your blood sugar twice a day.  vary the time of day when you check, between before the 3 meals, and at bedtime.  also check if you have symptoms of your blood sugar being too high or too low.  please keep a record of the readings and bring it to your next appointment here (or you can bring the meter itself).  You can write it on any piece of paper.  please call us sooner if your blood sugar goes below 70, or if you have a lot of readings over 200.   Please come back for a follow-up appointment in 2 months.

## 2021-01-08 NOTE — Progress Notes (Signed)
Subjective:    Patient ID: Bridget [name], female    DOB: [birth year], 60 y.o.   MRN: [id number]  HPI Pt returns for f/u of diabetes mellitus: DM type: Insulin-requiring type 2.  Dx'ed: 2000 Complications: PN Therapy: insulin and Trulicity.   GDM: never.  DKA: never.   Severe hypoglycemia: never.   Pancreatitis: never.  SDOH: she works 3rd shift.  Other: she did not tolerate invokana (vaginitis) or metformin-XR (diarrhea); edema precludes pioglitizone rx.  she also took insulin from 2000-2003; she resumed in 2017, She declines multiple daily injections.   Interval history: pt says cbg varies from 76-131.  She checks fasting only.  pt states she feels well in general. She seldom has hypoglycemia, and these episodes are mild.  She says Trulicity is 0.75 mg QW.  She skips [place] approx 2-3 times per week, due to frequent hypoglycemia.   Past Medical History:  Diagnosis Date  . Bacterial vaginosis 12/07/2010  . Depression   . Diabetes mellitus without complication (HCC)   . Diverticulosis   . DIVERTICULOSIS, COLON 10/26/2006  . Fibromyalgia   . GERD (gastroesophageal reflux disease)   . Glaucoma   . Headache   . HEEL SPUR 09/10/2007   Qualifier: Diagnosis of  By: Lanier Prude  MD, Cathrine Muster    . Hypertension   . MALAISE AND FATIGUE 05/27/2010  . Obesity   . OBESITY, NOS 10/26/2006  . Osteoarthritis   . OSTEOARTHRITIS, KNEE 04/27/2010  . Retained tampon 05/21/2012  . SHOULDER PAIN, LEFT 05/19/2010   Qualifier: Diagnosis of  By: Clotilde Dieter MD, Amber    . Vaginal yeast infection 12/08/2010    Past Surgical History:  Procedure Laterality Date  . ABDOMINAL HYSTERECTOMY  1990  . ABDOMINAL HYSTERECTOMY    . BLADDER SURGERY  2001  . CATARACT EXTRACTION Right 2001   with implant  . EYE SURGERY     implant rt eye  . ROTATOR CUFF REPAIR Right   . SHOULDER ARTHROSCOPY WITH SUBACROMIAL DECOMPRESSION Left 07/20/2015   Procedure: SHOULDER ARTHROSCOPY WITH DEBRIDEMENT OF ROTATOR CUFF AND  SUBACROMIAL DECOMPRESSION ;  Surgeon: Jones Broom, MD;  Location: [facility];  Service: Orthopedics;  Laterality: Left;  Left shoulder arthroscopy with debridement of rotator cuff and subacromial decompression    Social History   Socioeconomic History  . Marital status: Divorced    Spouse name: Not on file  . Number of children: 2  . Years of education: Not on file  . Highest education level: Not on file  Occupational History  . Occupation: PBX Nutritional therapist: [employer]  Tobacco Use  . Smoking status: Former Smoker    Packs/day: 0.30    Years: 20.00    Pack years: 6.00    Types: Cigarettes    Quit date: 08/29/1998    Years since quitting: 22.3  . Smokeless tobacco: Never Used  . Tobacco comment: Quit in 2000   Vaping Use  . Vaping Use: Never used  Substance and Sexual Activity  . Alcohol use: Yes    Comment: socially  . Drug use: No  . Sexual activity: Yes    Birth control/protection: Surgical  Other Topics Concern  . 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 aspirin 81 MG tablet Take 1 tablet (81 mg total) by mouth daily.    . Blood Glucose Monitoring Suppl (FREESTYLE LITE) DEVI 1 each by Does not apply route 2 (two) times daily. E11.9 1 each 0  . cetirizine (ZYRTEC) 10 MG tablet Take 1 tablet (10 mg total) by mouth daily. 30 tablet 11  . docusate sodium (COLACE) 100 MG capsule Take 1 capsule (100 mg total) by mouth 3 (three) times daily as needed. 20 capsule 0  . Dulaglutide (TRULICITY) 0.75 MG/0.5ML SOPN Inject 0.75 mg into the skin once a week. 6  mL 1  . fluticasone (FLONASE) 50 MCG/ACT nasal spray Place 2 sprays into both nostrils daily. 16 g 0  . FREESTYLE LITE test strip USE TO TEST TWICE A DAY 100 strip 0  . Insulin Pen Needle (BD PEN NEEDLE NANO 2ND GEN) 32G X 4 MM MISC USE AS DIRECTED 4 TIMES A DAY 100 each 0  . Lancets (FREESTYLE) lancets USE TO CHECK BLOOD SUGAR TWICE DAILY. 100 each 0  . lisinopril (ZESTRIL) 5 MG tablet Take 1 tablet (5 mg total) by mouth daily. 90 tablet 3  . meloxicam (MOBIC) 15 MG tablet TAKE 1 TABLET BY MOUTH EVERY DAY 30 tablet 0  . Omega 3-6-9 Fatty Acids (TRIPLE OMEGA-3-6-9) CAPS Take 1 capsule by mouth daily.    . pantoprazole (PROTONIX) 20 MG tablet TAKE 1 TABLET BY MOUTH DAILY. 90 tablet 3  . pregabalin (LYRICA) 150 MG capsule TAKE 1 CAPSULE BY MOUTH 2 TIMES DAILY. 60 capsule 0  . ROCKLATAN 0.02-0.005 % SOLN SMARTSIG:1 Drop(s) In Eye(s) Every Evening    . traMADol (ULTRAM) 50 MG tablet Take 1 tablet (50 mg total) by mouth every 6 (six) hours as needed. 15 tablet 0  . triamcinolone (NASACORT AQ) 55 MCG/ACT AERO nasal inhaler Place 2 sprays into the nose daily. 1 Inhaler 12   No current facility-administered medications on file prior to visit.    Allergies  Allergen Reactions  . Sulfa Antibiotics Hives and Rash  . Citrus Itching  . Invokana [Canagliflozin] Other (See Comments)    Caused frequent vaginal yeast infections  . Codeine Hives  . Metformin And Related Diarrhea and Nausea And Vomiting  . Metoclopramide Hcl Nausea And Vomiting  . Penicillins Nausea And Vomiting  . Propoxyphene Nausea And Vomiting    GI upset  . Propoxyphene N-Acetaminophen Nausea And Vomiting  . Sulfonamide Derivatives Hives    Family History  Problem Relation Age of Onset  . Breast cancer Mother   . Diabetes Mother   . Cancer Mother   . Ovarian cancer Maternal Grandmother   . Heart disease Maternal Grandmother        great  . Stomach cancer Cousin   . Diabetes Brother        x 3  . Hypertension Brother    . Alcohol abuse Father   . Diabetes Brother   . Diabetes Brother   . Hypertension Brother   . Hypertension Sister   . Diabetes Sister     BP 140/76 (BP Location: Right Arm, Patient Position: Sitting, Cuff Size: Large)   Pulse 76   Ht 5' 5.5" (1.664 m)   Wt 227 lb 9.6 oz (103.2 kg)   SpO2 95%   BMI 37.30 kg/m  Review of Systems Nausea is mild.      Objective:   Physical Exam VITAL SIGNS:  See vs page GENERAL: no distress Pulses: dorsalis pedis intact bilat.   MSK: no deformity of the feet CV: no leg edema Skin:  no ulcer on the feet.  normal color and temp on the feet. Neuro: sensation is intact to touch on the feet.    A1c=5.9%    Assessment & Plan:  Insulin-requiring type 2 DM: overcontrolled  Patient Instructions  Please reduce the Tresiba to 15 units daily, and:  Please continue the same Trulicity.  check your blood sugar twice a day.  vary the time of day when you check, between before the 3 meals, and at bedtime.  also check if you have symptoms of your blood sugar being too high or too low.  please keep a record of the readings and bring it to your next appointment here (or you can bring the meter itself).  You can write it on any piece of paper.  please call us sooner if your blood sugar goes below 70, or if you have a lot of readings over 200.   Please come back for a follow-up appointment in 2 months.

## 2021-01-14 ENCOUNTER — Other Ambulatory Visit: Payer: Self-pay | Admitting: Endocrinology

## 2021-01-14 DIAGNOSIS — E1165 Type 2 diabetes mellitus with hyperglycemia: Secondary | ICD-10-CM

## 2021-01-14 DIAGNOSIS — IMO0002 Reserved for concepts with insufficient information to code with codable children: Secondary | ICD-10-CM

## 2021-02-04 ENCOUNTER — Other Ambulatory Visit: Payer: Self-pay

## 2021-02-04 ENCOUNTER — Ambulatory Visit (INDEPENDENT_AMBULATORY_CARE_PROVIDER_SITE_OTHER): Payer: BC Managed Care – PPO | Admitting: Podiatry

## 2021-02-04 DIAGNOSIS — E1149 Type 2 diabetes mellitus with other diabetic neurological complication: Secondary | ICD-10-CM | POA: Diagnosis not present

## 2021-02-04 DIAGNOSIS — M722 Plantar fascial fibromatosis: Secondary | ICD-10-CM

## 2021-02-04 NOTE — Patient Instructions (Signed)

## 2021-02-09 NOTE — Progress Notes (Signed)
Subjective: 60 year old female presents the office today for follow-up evaluation of neuropathy.  She states that she is still taking Lyrica which has been helping the fibromyalgia.  Also she is started hydroxychloroquine due to autoimmune issues.  She states he is also having a flareup of plantar fasciitis.  She states that she is starting the new job and she is on her feet more.  No recent injury or trauma that she reports. Denies any systemic complaints such as fevers, chills, nausea, vomiting. No acute changes since last appointment, and no other complaints at this time.   Objective: AAO x3, NAD DP/PT pulses palpable bilaterally, CRT less than 3 seconds Tenderness to palpation along the plantar medial tubercle of the calcaneus at the insertion of plantar fascia on the left and right foot. There is no pain along the course of the plantar fascia within the arch of the foot. Plantar fascia appears to be intact. There is no pain with lateral compression of the calcaneus or pain with vibratory sensation. There is no pain along the course or insertion of the achilles tendon. No other areas of tenderness to bilateral lower extremities. No pain with calf compression, swelling, warmth, erythema  Assessment: 60 year old female with neuropathy, fibromyalgia on Lyrica; plantar fasciitis  Plan: -All treatment options discussed with the patient including all alternatives, risks, complications.  -Regards the plantar fashion as we discussed steroid injection but she was to hold off on this.  Discussed stretching, icing daily.  Discussed wearing supportive shoes and good arch supports. -Continue Lyrica -Discussed daily foot inspection. -Patient encouraged to call the office with any questions, concerns, change in symptoms.   Vivi Barrack DPM

## 2021-02-24 ENCOUNTER — Telehealth: Payer: Self-pay | Admitting: Podiatry

## 2021-02-24 NOTE — Telephone Encounter (Signed)
Orthotics in... lvm for pt to call tolet me know if she would like an appt to pick them up next week or if she would like to wait until her appt on 7.12.with Dr Ardelle Anton.

## 2021-02-25 NOTE — Telephone Encounter (Signed)
Pt scheduled appt to pick them up on 7.5

## 2021-03-02 ENCOUNTER — Other Ambulatory Visit: Payer: Self-pay | Admitting: Endocrinology

## 2021-03-02 ENCOUNTER — Other Ambulatory Visit: Payer: Self-pay

## 2021-03-02 ENCOUNTER — Ambulatory Visit (INDEPENDENT_AMBULATORY_CARE_PROVIDER_SITE_OTHER): Payer: BC Managed Care – PPO

## 2021-03-02 DIAGNOSIS — M722 Plantar fascial fibromatosis: Secondary | ICD-10-CM

## 2021-03-02 DIAGNOSIS — IMO0002 Reserved for concepts with insufficient information to code with codable children: Secondary | ICD-10-CM

## 2021-03-02 NOTE — Progress Notes (Signed)
Patient in office today to pick-up custom orthotics. Patient tried the orthotics on in  sneakers and was satisfied with the fit. Patient was educated on the break-in process and verbalized understanding. Patient advised to call the office with any questions, comments, or concerns.

## 2021-03-09 ENCOUNTER — Ambulatory Visit (INDEPENDENT_AMBULATORY_CARE_PROVIDER_SITE_OTHER): Payer: BC Managed Care – PPO | Admitting: Podiatry

## 2021-03-09 ENCOUNTER — Other Ambulatory Visit: Payer: Self-pay

## 2021-03-09 DIAGNOSIS — E1149 Type 2 diabetes mellitus with other diabetic neurological complication: Secondary | ICD-10-CM

## 2021-03-09 DIAGNOSIS — M722 Plantar fascial fibromatosis: Secondary | ICD-10-CM

## 2021-03-09 NOTE — Progress Notes (Signed)
  Subjective: 60 year old female presents the office today for follow-up evaluation of plantar fasciitis, neuropathy.  She states that overall is been about the same.  She states the orthotics were cut too short and her use her hands over the help of them causing discomfort.  Over the orthotics are comfortable but feels they are too short.  She still demonstration on icing.  No recent injury or trauma any changes otherwise.  She states that she is still taking Lyrica which has been helping the fibromyalgia.   Objective: AAO x3, NAD DP/PT pulses palpable bilaterally, CRT less than 3 seconds There is continuation of tenderness to palpation along the plantar medial tubercle of the calcaneus at the insertion of plantar fascia on the left and right foot. There is no pain along the course of the plantar fascia within the arch of the foot. Plantar fascia appears to be intact. There is no pain with lateral compression of the calcaneus or pain with vibratory sensation. There is no pain along the course or insertion of the achilles tendon. No other areas of tenderness to bilateral lower extremities.  Negative Tinel sign. No pain with calf compression, swelling, warmth, erythema  Assessment: 60 year old female with neuropathy, fibromyalgia on Lyrica; plantar fasciitis  Plan: -All treatment options discussed with the patient including all alternatives, risks, complications.  -Her orthotics are too short. When she was measured she actually wears them when shoes and she was provided with a limited shoe size.  We will send this back to extended top-cover.  Also her for orthotics have a neuroma pad.  She is okay not having this added to the new insert.  -Continue stretching, icing daily.  Continue with supportive shoe gear. -Consider steroid injection as needed -Continue Lyrica -Discussed daily foot inspection. -Patient encouraged to call the office with any questions, concerns, change in symptoms.   Vivi Barrack DPM

## 2021-03-25 ENCOUNTER — Telehealth: Payer: Self-pay | Admitting: Podiatry

## 2021-03-25 NOTE — Telephone Encounter (Signed)
Adjusted orthotics in.. pt aware ok to pick up... 

## 2021-04-19 ENCOUNTER — Ambulatory Visit: Payer: 59 | Admitting: Endocrinology

## 2021-05-09 ENCOUNTER — Other Ambulatory Visit: Payer: Self-pay | Admitting: Endocrinology

## 2021-05-09 DIAGNOSIS — E1165 Type 2 diabetes mellitus with hyperglycemia: Secondary | ICD-10-CM

## 2021-05-09 DIAGNOSIS — IMO0002 Reserved for concepts with insufficient information to code with codable children: Secondary | ICD-10-CM

## 2021-05-13 ENCOUNTER — Other Ambulatory Visit: Payer: Self-pay | Admitting: Endocrinology

## 2021-05-13 DIAGNOSIS — IMO0002 Reserved for concepts with insufficient information to code with codable children: Secondary | ICD-10-CM

## 2021-05-13 DIAGNOSIS — E1165 Type 2 diabetes mellitus with hyperglycemia: Secondary | ICD-10-CM

## 2021-05-17 ENCOUNTER — Other Ambulatory Visit: Payer: Self-pay | Admitting: Endocrinology

## 2021-05-20 ENCOUNTER — Other Ambulatory Visit: Payer: Self-pay

## 2021-05-20 ENCOUNTER — Ambulatory Visit (INDEPENDENT_AMBULATORY_CARE_PROVIDER_SITE_OTHER): Payer: BC Managed Care – PPO | Admitting: Endocrinology

## 2021-05-20 VITALS — BP 116/60 | HR 75 | Ht 65.5 in | Wt 213.8 lb

## 2021-05-20 DIAGNOSIS — E118 Type 2 diabetes mellitus with unspecified complications: Secondary | ICD-10-CM | POA: Diagnosis not present

## 2021-05-20 DIAGNOSIS — E1165 Type 2 diabetes mellitus with hyperglycemia: Secondary | ICD-10-CM | POA: Diagnosis not present

## 2021-05-20 DIAGNOSIS — IMO0002 Reserved for concepts with insufficient information to code with codable children: Secondary | ICD-10-CM

## 2021-05-20 LAB — POCT GLYCOSYLATED HEMOGLOBIN (HGB A1C): Hemoglobin A1C: 6 % — AB (ref 4.0–5.6)

## 2021-05-20 MED ORDER — TRULICITY 1.5 MG/0.5ML ~~LOC~~ SOAJ: 1.5000 mg | SUBCUTANEOUS | 3 refills | Status: DC

## 2021-05-20 NOTE — Progress Notes (Signed)
Subjective:    Patient ID: JHANVI DRAKEFORD, female    DOB: 10/25/1960, 60 y.o.   MRN: [id number]  HPI Pt returns for f/u of diabetes mellitus: DM type: Insulin-requiring type 2.  Dx'ed: 2000 Complications: PN Therapy: insulin since 2017, and Trulicity.   GDM: never.  DKA: never.   Severe hypoglycemia: never.   Pancreatitis: never.  SDOH: she works 3rd shift.  Other: she did not tolerate invokana (vaginitis) or metformin-XR (diarrhea); edema precludes pioglitizone rx.  she also took insulin from 2000-2003; She declines multiple daily injections.   Interval history: pt says cbg varies from 95-115.  She checks fasting only.  pt states she feels well in general.   She still sometimes skips tresiba, due to normoglycemia.   Past Medical History:  Diagnosis Date   Bacterial vaginosis 12/07/2010   Depression    Diabetes mellitus without complication (HCC)    Diverticulosis    DIVERTICULOSIS, COLON 10/26/2006   Fibromyalgia    GERD (gastroesophageal reflux disease)    Glaucoma    Headache    HEEL SPUR 09/10/2007   Qualifier: Diagnosis of  By: Lanier Prude  MD, Taineisha     Hypertension    MALAISE AND FATIGUE 05/27/2010   Obesity    OBESITY, NOS 10/26/2006   Osteoarthritis    OSTEOARTHRITIS, KNEE 04/27/2010   Retained tampon 05/21/2012   SHOULDER PAIN, LEFT 05/19/2010   Qualifier: Diagnosis of  By: Clotilde Dieter MD, Amber     Vaginal yeast infection 12/08/2010    Past Surgical History:  Procedure Laterality Date   ABDOMINAL HYSTERECTOMY  1990   ABDOMINAL HYSTERECTOMY     BLADDER SURGERY  2001   CATARACT EXTRACTION Right 2001   with implant   EYE SURGERY     implant rt eye   ROTATOR CUFF REPAIR Right    SHOULDER ARTHROSCOPY WITH SUBACROMIAL DECOMPRESSION Left 07/20/2015   Procedure: SHOULDER ARTHROSCOPY WITH DEBRIDEMENT OF ROTATOR CUFF AND SUBACROMIAL DECOMPRESSION ;  Surgeon: Jones Broom, MD;  Location: [facility];  Service: Orthopedics;  Laterality: Left;  Left shoulder  arthroscopy with debridement of rotator cuff and subacromial decompression    Social History   Socioeconomic History   Marital status: Divorced    Spouse name: Not on file   Number of children: 2   Years of education: Not on file   Highest education level: Not on file  Occupational History   Occupation: PBX Designer, television/film set    Employer: [employer]  Tobacco Use   Smoking status: Former    Packs/day: 0.30    Years: 20.00    Pack years: 6.00    Types: Cigarettes    Quit date: 08/29/1998    Years since quitting: 22.7   Smokeless tobacco: Never   Tobacco comments:    Quit in 2000   Vaping Use   Vaping Use: Never used  Substance and Sexual Activity   Alcohol use: Yes    Comment: socially   Drug use: No   Sexual activity: Yes    Birth control/protection: Surgical  Other Topics Concern   Not on file  Social History Narrative   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     BD PEN NEEDLE NANO 2ND GEN 32G X 4 MM MISC USE AS DIRECTED 4 TIMES A DAY 100 each 0   Blood Glucose Monitoring Suppl (FREESTYLE LITE) DEVI 1 each by Does not apply route 2 (two) times daily. E11.9 1 each 0   cetirizine (ZYRTEC) 10 MG tablet Take 1 tablet (10 mg total) by mouth daily. 30 tablet 11   docusate sodium (COLACE) 100 MG capsule Take 1 capsule (100 mg total) by mouth 3 (three) times daily as needed. 20 capsule 0   fluticasone (FLONASE) 50 MCG/ACT nasal spray Place 2 sprays into both nostrils daily. 16 g 0   FREESTYLE LITE test strip USE TO TEST TWICE A DAY 100 strip 0   Lancets (FREESTYLE)  lancets USE TO CHECK BLOOD SUGAR TWICE DAILY. 100 each 0   lisinopril (ZESTRIL) 5 MG tablet Take 1 tablet (5 mg total) by mouth daily. 90 tablet 3   meloxicam (MOBIC) 15 MG tablet TAKE 1 TABLET BY MOUTH EVERY DAY 30 tablet 0   Omega 3-6-9 Fatty Acids (TRIPLE OMEGA-3-6-9) CAPS Take 1 capsule by mouth daily.     pantoprazole (PROTONIX) 20 MG tablet TAKE 1 TABLET BY MOUTH DAILY. 90 tablet 3   pregabalin (LYRICA) 150 MG capsule TAKE 1 CAPSULE BY MOUTH 2 TIMES DAILY. 60 capsule 0   ROCKLATAN 0.02-0.005 % SOLN SMARTSIG:1 Drop(s) In Eye(s) Every Evening     traMADol (ULTRAM) 50 MG tablet Take 1 tablet (50 mg total) by mouth every 6 (six) hours as needed. 15 tablet 0   triamcinolone (NASACORT AQ) 55 MCG/ACT AERO nasal inhaler Place 2 sprays into the nose daily. 1 Inhaler 12   No current facility-administered medications on file prior to visit.    Allergies  Allergen Reactions   Sulfa Antibiotics Hives and Rash   Citrus Itching   Invokana [Canagliflozin] Other (See Comments)    Caused frequent vaginal yeast infections   Codeine Hives   Metformin And Related Diarrhea and Nausea And Vomiting   Metoclopramide Hcl Nausea And Vomiting   Penicillins Nausea And Vomiting   Propoxyphene Nausea And Vomiting    GI upset   Propoxyphene N-Acetaminophen Nausea And Vomiting   Sulfonamide Derivatives Hives    Family History  Problem Relation Age of Onset   Breast cancer Mother    Diabetes Mother    Cancer Mother    Ovarian cancer Maternal Grandmother    Heart disease Maternal Grandmother        great   Stomach cancer Cousin    Diabetes Brother        x 3   Hypertension Brother    Alcohol abuse Father    Diabetes Brother    Diabetes Brother    Hypertension Brother    Hypertension Sister    Diabetes Sister     BP 116/60 (BP Location: Right Arm, Patient Position: Sitting, Cuff Size: Large)   Pulse 75   Ht 5' 5.5" (1.664 m)   Wt 213 lb 12.8 oz (97 kg)   SpO2 95%   BMI 35.04 kg/m     Review of Systems     Objective:   Physical Exam Pulses: dorsalis pedis intact bilat.   MSK: no deformity of the feet CV: no leg edema Skin:  no ulcer on the feet.  normal color and temp on the  feet. Neuro: sensation is intact to touch on the feet  Lab Results  Component Value Date   CREATININE 0.76 09/07/2018   BUN 12 09/07/2018   NA 139 09/07/2018   K 4.1 09/07/2018   CL 106 09/07/2018   CO2 27 09/07/2018     Lab Results  Component Value Date   HGBA1C 6.0 (A) 05/20/2021      Assessment & Plan:  Insulin-requiring type 2 DM: overcontrolled.    Patient Instructions  Please stop taking the [place], and:  I have sent a prescription to your pharmacy, to double the Trulicity.  check your blood sugar twice a day.  vary the time of day when you check, between before the 3 meals, and at bedtime.  also check if you have symptoms of your blood sugar being too high or too low.  please keep a record of the readings and bring it to your next appointment here (or you can bring the meter itself).  You can write it on any piece of paper.  please call us sooner if your blood sugar goes below 70, or if you have a lot of readings over 200.   Please come back for a follow-up appointment in 4 months.

## 2021-05-20 NOTE — Patient Instructions (Addendum)
Please stop taking the [place], and:  I have sent a prescription to your pharmacy, to double the Trulicity.  check your blood sugar twice a day.  vary the time of day when you check, between before the 3 meals, and at bedtime.  also check if you have symptoms of your blood sugar being too high or too low.  please keep a record of the readings and bring it to your next appointment here (or you can bring the meter itself).  You can write it on any piece of paper.  please call us sooner if your blood sugar goes below 70, or if you have a lot of readings over 200.   Please come back for a follow-up appointment in 4 months.

## 2021-06-08 ENCOUNTER — Other Ambulatory Visit: Payer: Self-pay

## 2021-06-08 ENCOUNTER — Encounter: Payer: Self-pay | Admitting: Podiatry

## 2021-06-08 ENCOUNTER — Ambulatory Visit: Payer: BC Managed Care – PPO | Admitting: Podiatry

## 2021-06-08 DIAGNOSIS — M722 Plantar fascial fibromatosis: Secondary | ICD-10-CM | POA: Diagnosis not present

## 2021-06-08 DIAGNOSIS — E1149 Type 2 diabetes mellitus with other diabetic neurological complication: Secondary | ICD-10-CM

## 2021-06-08 DIAGNOSIS — L84 Corns and callosities: Secondary | ICD-10-CM

## 2021-06-08 NOTE — Patient Instructions (Signed)

## 2021-06-08 NOTE — Progress Notes (Signed)
Subjective: 60 year old female presents the office today for follow-up evaluation of plantar fasciitis, neuropathy.  She states that she has been off of Lyrica for the last month and she has noticed increased discomfort to her feet.  She is still taking the meloxicam.  The orthotics have been helpful.  She says the calluses also get tender at times.  Denies any open lesions.  She has no recent injury or other changes.  No other concerns.  She states that she is still taking Lyrica which has been helping the fibromyalgia.   Objective: AAO x3, NAD DP/PT pulses palpable bilaterally, CRT less than 3 seconds There is still some modest to the plantar aspect of feet but today appears to be more on the arch of the foot.  Some discomfort on the insertion of the calcaneus.  There is no pain with lateral compression of calcaneus.  There is no pain with Achilles tendon.  Equinus is present.  Mild hyperkeratotic tissue lateral foot bilaterally without any underlying ulceration drainage or signs of infection.  No other areas of discomfort. No pain with calf compression, swelling, warmth, erythema  Assessment: 60 year old female with neuropathy, fibromyalgia on Lyrica; plantar fasciitis  Plan: -All treatment options discussed with the patient including all alternatives, risks, complications.  -Orthotics are doing well for her.  She stopped taking meloxicam.  Encouraged her to physical therapy as well. -Continue Lyrica to help with fibromyalgia but also the neuropathy symptoms to her feet.  She states that she has been out of the medication for a month and she is due to follow-up with her primary care physician for this today. -Moisturizer daily.  I did lightly debride the hyperkeratotic lesions that were present x2 without any complications or bleeding.  Vivi Barrack DPM

## 2021-06-10 ENCOUNTER — Other Ambulatory Visit: Payer: Self-pay | Admitting: Family Medicine

## 2021-06-10 DIAGNOSIS — Z1231 Encounter for screening mammogram for malignant neoplasm of breast: Secondary | ICD-10-CM

## 2021-06-23 ENCOUNTER — Other Ambulatory Visit: Payer: Self-pay

## 2021-06-23 ENCOUNTER — Ambulatory Visit: Payer: BC Managed Care – PPO | Attending: Podiatry

## 2021-06-23 DIAGNOSIS — M6281 Muscle weakness (generalized): Secondary | ICD-10-CM | POA: Diagnosis present

## 2021-06-23 DIAGNOSIS — M25674 Stiffness of right foot, not elsewhere classified: Secondary | ICD-10-CM | POA: Insufficient documentation

## 2021-06-23 DIAGNOSIS — M79671 Pain in right foot: Secondary | ICD-10-CM | POA: Insufficient documentation

## 2021-06-23 DIAGNOSIS — M79672 Pain in left foot: Secondary | ICD-10-CM | POA: Insufficient documentation

## 2021-06-23 DIAGNOSIS — R262 Difficulty in walking, not elsewhere classified: Secondary | ICD-10-CM | POA: Diagnosis present

## 2021-06-23 DIAGNOSIS — M25675 Stiffness of left foot, not elsewhere classified: Secondary | ICD-10-CM | POA: Insufficient documentation

## 2021-06-23 DIAGNOSIS — M722 Plantar fascial fibromatosis: Secondary | ICD-10-CM | POA: Insufficient documentation

## 2021-06-23 NOTE — Therapy (Signed)
[facility] Outpatient Rehabilitation [facility] [address] [place], [place], [address] Phone: [phone]   Fax:  [phone]  Physical Therapy Evaluation  Patient Details  Name: Bridget [name] MRN: [id number] Date of Birth: 12-09-1960 Referring Provider (PT): Vivi Barrack, [place]   Encounter Date: 06/23/2021   PT End of Session - 06/23/21 0925     Visit Number 1    Number of Visits 17    Date for PT Re-Evaluation 08/21/21    Authorization Type BCBS    PT Start Time 0930    PT Stop Time 1017    PT Time Calculation (min) 47 min    Activity Tolerance Patient tolerated treatment well    Behavior During Therapy [facility] for tasks assessed/performed             Past Medical History:  Diagnosis Date   Bacterial vaginosis 12/07/2010   Depression    Diabetes mellitus without complication (HCC)    Diverticulosis    DIVERTICULOSIS, COLON 10/26/2006   Fibromyalgia    GERD (gastroesophageal reflux disease)    Glaucoma    Headache    HEEL SPUR 09/10/2007   Qualifier: Diagnosis of  By: Lanier Prude  MD, Taineisha     Hypertension    MALAISE AND FATIGUE 05/27/2010   Obesity    OBESITY, NOS 10/26/2006   Osteoarthritis    OSTEOARTHRITIS, KNEE 04/27/2010   Retained tampon 05/21/2012   SHOULDER PAIN, LEFT 05/19/2010   Qualifier: Diagnosis of  By: Clotilde Dieter MD, Amber     Vaginal yeast infection 12/08/2010    Past Surgical History:  Procedure Laterality Date   ABDOMINAL HYSTERECTOMY  1990   ABDOMINAL HYSTERECTOMY     BLADDER SURGERY  2001   CATARACT EXTRACTION Right 2001   with implant   EYE SURGERY     implant rt eye   ROTATOR CUFF REPAIR Right    SHOULDER ARTHROSCOPY WITH SUBACROMIAL DECOMPRESSION Left 07/20/2015   Procedure: SHOULDER ARTHROSCOPY WITH DEBRIDEMENT OF ROTATOR CUFF AND SUBACROMIAL DECOMPRESSION ;  Surgeon: Jones Broom, MD;  Location: [facility];  Service: Orthopedics;  Laterality: Left;  Left shoulder arthroscopy with debridement of  rotator cuff and subacromial decompression    There were no vitals filed for this visit.    Subjective Assessment - 06/23/21 0926     Subjective "My feet I want to cut them off." She reports having years of foot pain. She is not experiencing any relief. 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 She reports the pain is along the ball of the foot and along the plantar aspect (more towards the lateral asepct). She is wearing paddings along the 5th metatarsal that Dr. Ardelle Anton prescribed for her callus. She reports the pain has been worsening since she started her new job in April. She is wearing custom orthotics that help some, but do not give her relief. She has not received injections. She has been completing stretching and icing that helps some, "but not much." She reports numbness and tingling about bilateral plantar aspect of the feet that is constant, which could be related to her diabetes. Pain at worst rated as 10/10 when she first wakes in the morning described as dull, sharp, and tingling.    Pertinent History Rt ankle fracture ~2017; fibromyalgia    Limitations Standing;Walking    How long can you sit comfortably? sitting is ok    How long can you stand comfortably? 1.5 hours    How long can  you walk comfortably? 1.5 hours    Patient Stated Goals "I know I'm not going to be pain free, so just how to deal with this pain; for the pain level not to be so intense. What I can do to ease it."    Currently in Pain? Yes    Pain Score 5     Pain Location Foot    Pain Orientation Left;Right   lateral aspect of plantar feet   Pain Descriptors / Indicators Numbness    Pain Type Chronic pain    Pain Onset More than a month ago    Pain Frequency Constant    Aggravating Factors  walking, standing    Pain Relieving Factors stretches, ice, rest                [facility] PT Assessment - 06/23/21 0001       Assessment   Medical Diagnosis M72.2 (ICD-10-CM) - Plantar fasciitis     Referring Provider (PT) Vivi Barrack, DPM    Onset Date/Surgical Date --   >10 years   Hand Dominance Right    Next MD Visit 09/08/2021    Prior Therapy Yes-shoulder, neck, and low back      Precautions   Precautions None      Restrictions   Weight Bearing Restrictions No      Balance Screen   Has the patient fallen in the past 6 months No      Home Environment   Living Environment Private residence    Living Arrangements Parent    Type of Home House    Additional Comments stairs to enter      Prior Function   Level of Independence Independent    Vocation Full time employment    Vocation Requirements custodian    Leisure beach, shopping, travel      Cognition   Overall Cognitive Status Within Functional Limits for tasks assessed      Observation/Other Assessments   Observations pes planus    Focus on Therapeutic Outcomes (FOTO)  38% function to 56% predicted      Sensation   Light Touch Not tested      Coordination   Gross Motor Movements are Fluid and Coordinated Yes      Posture/Postural Control   Posture/Postural Control No significant limitations      AROM   Overall AROM Comments Ankle PF/Inversion, and eversion WNL bilaterally    Right Ankle Dorsiflexion 1    Left Ankle Dorsiflexion 0      PROM   Overall PROM Comments great toe ext PROM 45deg L, 40deg R      Strength   Right Hip Flexion 4/5    Right Hip Extension 4-/5    Right Hip ABduction 4-/5    Left Hip Flexion 4-/5    Left Hip Extension 4-/5    Left Hip ABduction 4-/5    Right Knee Flexion 4/5    Right Knee Extension 5/5    Left Knee Flexion 4/5    Left Knee Extension 5/5    Right Ankle Dorsiflexion 5/5    Right Ankle Plantar Flexion 5/5    Right Ankle Inversion 5/5    Right Ankle Eversion 5/5    Left Ankle Dorsiflexion 5/5    Left Ankle Plantar Flexion 5/5    Left Ankle Inversion 5/5    Left Ankle Eversion 5/5      Flexibility   Hamstrings WFL, tissue stretch end feel     Quadriceps  tightness bilaterally      Palpation   Palpation comment TTP along bilateral plantar aspect of the feet, with most significant tenderness along tarsals and metatarsals.      Ambulation/Gait   Ambulation/Gait Yes    Ambulation Surface Level    Gait Comments decreased heel strike bilaterally, limited pronation during stance bilaterally, limited push-off      Balance   Balance Assessed Yes      Static Standing Balance   Static Standing - Comment/# of Minutes 3seconds on Lt, 5seconds on Rt                        Objective measurements completed on examination: See above findings.       [facility] Adult PT Treatment/Exercise - 06/23/21 0001       Self-Care   Self-Care Other Self-Care Comments    Other Self-Care Comments  see patient education                     PT Education - 06/23/21 [phone]     Education Details education on assessment findings, POC, and HEP, and modalities for pain control.    Person(s) Educated Patient    Methods Explanation;Demonstration;Handout;Verbal cues    Comprehension Verbalized understanding;Returned demonstration;Verbal cues required              PT Short Term Goals - 06/23/21 0949       PT SHORT TERM GOAL #1   Title Patient will be independent with initial HEP.    Baseline issued at eval    Status New    Target Date 07/07/21      PT SHORT TERM GOAL #2   Title Therapist will review FOTO and anticipated progress.    Baseline FOTO captured    Status New    Target Date 07/07/21      PT SHORT TERM GOAL #3   Title Patient will demonstrate at least 5 degrees of bilateral ankle dorsiflexion AROM to reduce stress along the plantar aspect of the foot with walking/standing.    Baseline see flowsheet    Status New    Target Date 07/21/21      PT SHORT TERM GOAL #4   Title Patient will demonstrate at least 55 degrees of bilateral great toe passive extension ROM to improve gait mechanics.    Baseline see  flowsheet    Status New    Target Date 07/21/21               PT Long Term Goals - 06/23/21 1025       PT LONG TERM GOAL #1   Title Patient will demonstrate at least 4+/5 bilateral hip strength to improve stability about the chain with prolonged walking and standing.    Baseline see flowsheet    Status New    Target Date 08/18/21      PT LONG TERM GOAL #2   Title Patient will maintain SLS for at least 10 seconds to improve overall ankle stability necessary for walking on uneven surfaces.    Baseline see flowsheet    Status New    Target Date 08/18/21      PT LONG TERM GOAL #3   Title Patient will score at least 56% function on FOTO to signify clinically meaningful improvement in functional abilities.    Status New    Target Date 08/18/21      PT LONG TERM GOAL #4   Title Patient  will report <6/10 pain upon first waking in the morning to signify improvements in her chronic condition.    Baseline 10/10    Status New    Target Date 08/18/21                    Plan - 06/23/21 1031     Clinical Impression Statement Patient is a 60 y/o female who presents to OPPT with chief complaint of bilateral foot pain about the plantar aspect. Her signs and symptoms are consistent with plantar fasciitis. She hsa palpable tenderness throughout the plantar fascia and reports pain at worst upon first waking in the morning. She has significant limitations into DF ROM and limited great toe extension ROM. She ambulates with a very rigid foot with limited heel strike bilaterally, limited pronation during stance phase, and decreased push-off. She has poor SL balance with notable arch collapse during SLS. She has good strength about bilateral ankles, though significant hip weakness present bilaterally. She will benefit from skilled PT to address the above stated deficits in order to optimize her function.    Personal Factors and Comorbidities Age;Fitness;Time since onset of  injury/illness/exacerbation;Profession;Comorbidity 3+    Comorbidities previous Rt anke fracture, fibromyalgia, diabetes    Examination-Activity Limitations Locomotion Level;Stand;Stairs    Examination-Participation Restrictions Occupation;Shop;Cleaning    Stability/Clinical Decision Making Stable/Uncomplicated    Clinical Decision Making Low    Rehab Potential Good    PT Frequency --   1-2/week   PT Duration 8 weeks    PT Treatment/Interventions ADLs/Self Care Home Management;Aquatic Therapy;Cryotherapy;Moist Heat;Ultrasound;Gait training;Stair training;Functional mobility training;Therapeutic activities;Therapeutic exercise;Balance training;Neuromuscular re-education;Patient/family education;Manual techniques;Passive range of motion;Dry needling;Taping;Vasopneumatic Device    PT Next Visit Plan Review FOTO, Review HEP, manual to plantar fascia/gastroc, intrinsic foot strengthening, hip strengthening.    PT Home Exercise Plan Access Code: [email or web address]    Consulted and Agree with Plan of Care Patient             Patient will benefit from skilled therapeutic intervention in order to improve the following deficits and impairments:  Abnormal gait, Difficulty walking, Decreased range of motion, Pain, Decreased strength  Visit Diagnosis: Pain in left foot  Pain in right foot  Stiffness of left foot, not elsewhere classified  Stiffness of right foot, not elsewhere classified  Muscle weakness (generalized)  Difficulty in walking, not elsewhere classified     Problem List Patient Active Problem List   Diagnosis Date Noted   Herpes simplex 05/11/2020   Menopausal symptom 05/11/2020   Exertional dyspnea 01/09/2020   First degree AV block 01/08/2020   Right hand weakness 09/18/2017   Plantar fasciitis 10/10/2016   Foot pain, bilateral 09/09/2016   Radiculopathy 09/09/2016   GERD (gastroesophageal reflux disease) 09/09/2014   Hyperlipidemia 05/24/2013   Lung nodule seen on imaging  study 09/21/2012   Chronic pain of multiple sites - Knees, Ankles, Back 11/09/2011   Lupus (systemic lupus erythematosus) (HCC) 06/28/2011   HYPOTHYROIDISM 04/27/2010   Depression 04/27/2010   OSTEOARTHRITIS, KNEE 04/27/2010   Back pain 04/27/2010   Diabetes mellitus type 2, uncontrolled, with complications 08/14/2007   Morbid obesity (HCC) 10/26/2006   HYPERTENSION, BENIGN SYSTEMIC 10/26/2006   VENOUS INSUFFICIENCY, CHRONIC 10/26/2006   Letitia Libra, PT, DPT, ATC 06/23/21 10:47 AM   [facility] Outpatient Rehabilitation [facility] [address] [place], [place], [address] Phone: [phone]   Fax:  [phone]  Name: Bridget [name] MRN: [id number] Date of Birth: 29-Dec-1960

## 2021-06-28 ENCOUNTER — Ambulatory Visit: Payer: BC Managed Care – PPO

## 2021-06-28 ENCOUNTER — Telehealth: Payer: Self-pay

## 2021-06-28 NOTE — Telephone Encounter (Signed)
Spoke with patient regarding missed PT appointment. She reports having car trouble and forgot to call and cancel her visit. Reviewed attendance policy and reminded patient of next scheduled visit.

## 2021-07-06 ENCOUNTER — Other Ambulatory Visit: Payer: Self-pay

## 2021-07-06 ENCOUNTER — Encounter: Payer: Self-pay | Admitting: Physical Therapy

## 2021-07-06 ENCOUNTER — Ambulatory Visit: Payer: BC Managed Care – PPO | Attending: Podiatry | Admitting: Physical Therapy

## 2021-07-06 DIAGNOSIS — M6281 Muscle weakness (generalized): Secondary | ICD-10-CM | POA: Diagnosis present

## 2021-07-06 DIAGNOSIS — R262 Difficulty in walking, not elsewhere classified: Secondary | ICD-10-CM | POA: Diagnosis present

## 2021-07-06 DIAGNOSIS — M25674 Stiffness of right foot, not elsewhere classified: Secondary | ICD-10-CM | POA: Diagnosis present

## 2021-07-06 DIAGNOSIS — M79671 Pain in right foot: Secondary | ICD-10-CM | POA: Insufficient documentation

## 2021-07-06 DIAGNOSIS — M79672 Pain in left foot: Secondary | ICD-10-CM | POA: Diagnosis present

## 2021-07-06 DIAGNOSIS — M25675 Stiffness of left foot, not elsewhere classified: Secondary | ICD-10-CM | POA: Diagnosis present

## 2021-07-06 NOTE — Patient Instructions (Signed)
Access Code: [email or web address] URL: [email or web address] Date: 07/06/2021 Prepared by: Jannette Spanner  Exercises Seated Self Great Toe Stretch - 2 x daily - 7 x weekly - 3 sets - 30 sec hold Gastroc Stretch on Wall - 2 x daily - 7 x weekly - 3 sets - 30 sec hold Soleus Stretch on Wall - 2 x daily - 7 x weekly - 3 sets - 30 sec hold Seated Hamstring Stretch - 2 x daily - 7 x weekly - 3 sets - 30 sec hold Seated Arch Lifts - 1 x daily - 7 x weekly - 2 sets - 10 reps Seated Great Toe Extension - 1 x daily - 7 x weekly - 2 sets - 10 reps Seated Lesser Toes Extension - 1 x daily - 7 x weekly - 2 sets - 10 reps Seated Toe Towel Scrunches - 1 x daily - 7 x weekly - 2 sets - 10 reps

## 2021-07-06 NOTE — Therapy (Signed)
[facility] Outpatient Rehabilitation [facility] [address] [place], [place], [address] Phone: [phone]   Fax:  [phone]  Physical Therapy Treatment  Patient Details  Name: Bridget [name] MRN: [id number] Date of Birth: 06-22-1961 Referring Provider (PT): Vivi Barrack, [place]   Encounter Date: 07/06/2021   PT End of Session - 07/06/21 1412     Visit Number 2    Number of Visits 17    Date for PT Re-Evaluation 08/21/21    Authorization Type BCBS    PT Start Time 0850    PT Stop Time 0930    PT Time Calculation (min) 40 min             Past Medical History:  Diagnosis Date   Bacterial vaginosis 12/07/2010   Depression    Diabetes mellitus without complication (HCC)    Diverticulosis    DIVERTICULOSIS, COLON 10/26/2006   Fibromyalgia    GERD (gastroesophageal reflux disease)    Glaucoma    Headache    HEEL SPUR 09/10/2007   Qualifier: Diagnosis of  By: Lanier Prude  MD, Taineisha     Hypertension    MALAISE AND FATIGUE 05/27/2010   Obesity    OBESITY, NOS 10/26/2006   Osteoarthritis    OSTEOARTHRITIS, KNEE 04/27/2010   Retained tampon 05/21/2012   SHOULDER PAIN, LEFT 05/19/2010   Qualifier: Diagnosis of  By: Clotilde Dieter MD, Amber     Vaginal yeast infection 12/08/2010    Past Surgical History:  Procedure Laterality Date   ABDOMINAL HYSTERECTOMY  1990   ABDOMINAL HYSTERECTOMY     BLADDER SURGERY  2001   CATARACT EXTRACTION Right 2001   with implant   EYE SURGERY     implant rt eye   ROTATOR CUFF REPAIR Right    SHOULDER ARTHROSCOPY WITH SUBACROMIAL DECOMPRESSION Left 07/20/2015   Procedure: SHOULDER ARTHROSCOPY WITH DEBRIDEMENT OF ROTATOR CUFF AND SUBACROMIAL DECOMPRESSION ;  Surgeon: Jones Broom, MD;  Location: [facility];  Service: Orthopedics;  Laterality: Left;  Left shoulder arthroscopy with debridement of rotator cuff and subacromial decompression    There were no vitals filed for this visit.   Subjective Assessment -  07/06/21 0859     Subjective My feet and and ankles are hurting, especially on the side and bottom where the calluses are.    Pertinent History Rt ankle fracture ~2017; fibromyalgia    Currently in Pain? Yes    Pain Score 7     Pain Location Foot    Pain Orientation Right;Left    Pain Descriptors / Indicators Throbbing    Pain Type Chronic pain    Aggravating Factors  walking and standing    Pain Relieving Factors stretches, ice, rest                               [facility] Adult PT Treatment/Exercise - 07/06/21 0001       Manual Therapy   Manual Therapy Soft tissue mobilization    Manual therapy comments passive DF stretch bilat    Soft tissue mobilization manually to bilateral gastrocs      Ankle Exercises: Stretches   Soleus Stretch 3 reps;30 seconds    Gastroc Stretch 3 reps;30 seconds    Other Stretch hamstring stretch      Ankle Exercises: Aerobic   Nustep L4 x 5 min      Ankle Exercises: Seated   Towel Crunch 5 reps   5  sets   Other Seated Ankle Exercises Arch lifts 5 sec x 10, Great toe raises x 10, Lesser Toe raises x 10, Toe opposition raises x10                     PT Education - 07/06/21 0920     Education Details HEP    Person(s) Educated Patient    Methods Explanation;Handout    Comprehension Verbalized understanding              PT Short Term Goals - 07/06/21 1414       PT SHORT TERM GOAL #1   Title Patient will be independent with initial HEP.    Baseline issued at eval; 07/05/21 reports compliance    Time 4    Period Weeks    Status Achieved    Target Date 07/07/21      PT SHORT TERM GOAL #2   Title Therapist will review FOTO and anticipated progress.    Baseline FOTO captured; reviewed on11/8/22    Time 4    Period Weeks    Status Achieved      PT SHORT TERM GOAL #3   Title Patient will demonstrate at least 5 degrees of bilateral ankle dorsiflexion AROM to reduce stress along the plantar aspect of the  foot with walking/standing.    Time 4    Period Weeks    Status Unable to assess      PT SHORT TERM GOAL #4   Title Patient will demonstrate at least 55 degrees of bilateral great toe passive extension ROM to improve gait mechanics.    Time 4    Period Weeks    Status Unable to assess    Target Date 07/21/21               PT Long Term Goals - 06/23/21 1025       PT LONG TERM GOAL #1   Title Patient will demonstrate at least 4+/5 bilateral hip strength to improve stability about the chain with prolonged walking and standing.    Baseline see flowsheet    Status New    Target Date 08/18/21      PT LONG TERM GOAL #2   Title Patient will maintain SLS for at least 10 seconds to improve overall ankle stability necessary for walking on uneven surfaces.    Baseline see flowsheet    Status New    Target Date 08/18/21      PT LONG TERM GOAL #3   Title Patient will score at least 56% function on FOTO to signify clinically meaningful improvement in functional abilities.    Status New    Target Date 08/18/21      PT LONG TERM GOAL #4   Title Patient will report <6/10 pain upon first waking in the morning to signify improvements in her chronic condition.    Baseline 10/10    Status New    Target Date 08/18/21                   Plan - 07/06/21 0902     Clinical Impression Statement Pt reports compliance with HEP stretches one time per day. She reports the stretches reduce her cramps in the calves. She has been unable to ice the feet due to being very tired at the end of the day. Began instrinsic foot strength in seated and updated HEP. She performed these well with min cues. Began manual to decrease calf tightness  and tissue was softened. FOTO score reviewed with patient today and she verbalized understanding.    PT Treatment/Interventions ADLs/Self Care Home Management;Aquatic Therapy;Cryotherapy;Moist Heat;Ultrasound;Gait training;Stair training;Functional mobility  training;Therapeutic activities;Therapeutic exercise;Balance training;Neuromuscular re-education;Patient/family education;Manual techniques;Passive range of motion;Dry needling;Taping;Vasopneumatic Device    PT Next Visit Plan Review HEP, manual to plantar fascia/gastroc, review intrinsic foot strengthening, hip strengthening.    PT Home Exercise Plan Access Code: [email or web address]             Patient will benefit from skilled therapeutic intervention in order to improve the following deficits and impairments:  Abnormal gait, Difficulty walking, Decreased range of motion, Pain, Decreased strength  Visit Diagnosis: Pain in left foot  Stiffness of left foot, not elsewhere classified  Stiffness of right foot, not elsewhere classified  Pain in right foot  Difficulty in walking, not elsewhere classified  Muscle weakness (generalized)     Problem List Patient Active Problem List   Diagnosis Date Noted   Herpes simplex 05/11/2020   Menopausal symptom 05/11/2020   Exertional dyspnea 01/09/2020   First degree AV block 01/08/2020   Right hand weakness 09/18/2017   Plantar fasciitis 10/10/2016   Foot pain, bilateral 09/09/2016   Radiculopathy 09/09/2016   GERD (gastroesophageal reflux disease) 09/09/2014   Hyperlipidemia 05/24/2013   Lung nodule seen on imaging study 09/21/2012   Chronic pain of multiple sites - Knees, Ankles, Back 11/09/2011   Lupus (systemic lupus erythematosus) (HCC) 06/28/2011   HYPOTHYROIDISM 04/27/2010   Depression 04/27/2010   OSTEOARTHRITIS, KNEE 04/27/2010   Back pain 04/27/2010   Diabetes mellitus type 2, uncontrolled, with complications 08/14/2007   Morbid obesity (HCC) 10/26/2006   HYPERTENSION, BENIGN SYSTEMIC 10/26/2006   VENOUS INSUFFICIENCY, CHRONIC 10/26/2006    Sherrie Mustache, PTA 07/06/2021, 2:19 PM  [facility] Out[facility] [address] [place], [place], [address] Phone: [phone]   Fax:   [phone]  Name: JOSEFITA WEISSMANN MRN: [id number] Date of Birth: 1961-03-02

## 2021-07-12 ENCOUNTER — Other Ambulatory Visit: Payer: Self-pay

## 2021-07-12 ENCOUNTER — Ambulatory Visit: Payer: BC Managed Care – PPO

## 2021-07-12 DIAGNOSIS — R262 Difficulty in walking, not elsewhere classified: Secondary | ICD-10-CM

## 2021-07-12 DIAGNOSIS — M25675 Stiffness of left foot, not elsewhere classified: Secondary | ICD-10-CM

## 2021-07-12 DIAGNOSIS — M25674 Stiffness of right foot, not elsewhere classified: Secondary | ICD-10-CM

## 2021-07-12 DIAGNOSIS — M6281 Muscle weakness (generalized): Secondary | ICD-10-CM

## 2021-07-12 DIAGNOSIS — M79672 Pain in left foot: Secondary | ICD-10-CM

## 2021-07-12 DIAGNOSIS — M79671 Pain in right foot: Secondary | ICD-10-CM

## 2021-07-12 NOTE — Therapy (Signed)
[facility] - Inpatient Outpatient Rehabilitation [facility] [address] [place], [place], [address] Phone: [phone]   Fax:  [phone]  Physical Therapy Treatment  Patient Details  Name: Bridget [name] MRN: [id number] Date of Birth: [birth year] Referring Provider (PT): Vivi Barrack, [place]   Encounter Date: 07/12/2021   PT End of Session - 07/12/21 0805     Visit Number 3    Number of Visits 17    Date for PT Re-Evaluation 08/21/21    Authorization Type BCBS    PT Start Time 0805    PT Stop Time 0845    PT Time Calculation (min) 40 min    Activity Tolerance Patient tolerated treatment well    Behavior During Therapy [facility] for tasks assessed/performed             Past Medical History:  Diagnosis Date   Bacterial vaginosis 12/07/2010   Depression    Diabetes mellitus without complication (HCC)    Diverticulosis    DIVERTICULOSIS, COLON 10/26/2006   Fibromyalgia    GERD (gastroesophageal reflux disease)    Glaucoma    Headache    HEEL SPUR 09/10/2007   Qualifier: Diagnosis of  By: Lanier Prude  MD, Taineisha     Hypertension    MALAISE AND FATIGUE 05/27/2010   Obesity    OBESITY, NOS 10/26/2006   Osteoarthritis    OSTEOARTHRITIS, KNEE 04/27/2010   Retained tampon 05/21/2012   SHOULDER PAIN, LEFT 05/19/2010   Qualifier: Diagnosis of  By: Clotilde Dieter MD, Amber     Vaginal yeast infection 12/08/2010    Past Surgical History:  Procedure Laterality Date   ABDOMINAL HYSTERECTOMY  1990   ABDOMINAL HYSTERECTOMY     BLADDER SURGERY  2001   CATARACT EXTRACTION Right 2001   with implant   EYE SURGERY     implant rt eye   ROTATOR CUFF REPAIR Right    SHOULDER ARTHROSCOPY WITH SUBACROMIAL DECOMPRESSION Left 07/20/2015   Procedure: SHOULDER ARTHROSCOPY WITH DEBRIDEMENT OF ROTATOR CUFF AND SUBACROMIAL DECOMPRESSION ;  Surgeon: Jones Broom, MD;  Location: [facility];  Service: Orthopedics;  Laterality: Left;  Left shoulder arthroscopy with debridement of  rotator cuff and subacromial decompression    There were no vitals filed for this visit.   Subjective Assessment - 07/12/21 0808     Subjective "It was hurting when I first got up this morning. It's hurting now, but not too bad.It was tingling and burning this morning, now it's just throbbing."    Currently in Pain? Yes    Pain Score 4     Pain Location Foot    Pain Orientation Left;Right;Lateral    Pain Descriptors / Indicators Throbbing    Pain Type Chronic pain    Pain Onset More than a month ago    Pain Frequency Constant    Aggravating Factors  walking, standing    Pain Relieving Factors rest             [facility] Adult PT Treatment/Exercise:  Therapeutic Exercise: - Repeated standing lumbar extension 1 x 10 - Seated repeated lumbar flexion 1 x 10   - Isolated great toe extension 1 x 10; bilateral  Manual Therapy: - STM/DTM Trigger point release bilateral gastroc/soleus, plantar fascia - Passive gastroc and great toe flexor stretching bilaterally  - metatarsal mobilizations A/P bilaterally - Great toe A/P mobilizations bilaterally  Neuromuscular re-ed: - n/a  Therapeutic Activity: - n/a  Self-care/Home Management: - Education on foot type and appropriate footwear/where to purchase.  -  Education on TPDN indications, side effects, and expectations.     [facility] PT Assessment - 07/12/21 0001       AROM   Overall AROM Comments Full and pain free lumbar AROM      Palpation   Spinal mobility L-spine WNL    Palpation comment tautness bilateral lumbar paraspinals, no palpable tendneress                                      PT Short Term Goals - 07/06/21 1414       PT SHORT TERM GOAL #1   Title Patient will be independent with initial HEP.    Baseline issued at eval; 07/05/21 reports compliance    Time 4    Period Weeks    Status Achieved    Target Date 07/07/21      PT SHORT TERM GOAL #2   Title Therapist will review FOTO and  anticipated progress.    Baseline FOTO captured; reviewed on11/8/22    Time 4    Period Weeks    Status Achieved      PT SHORT TERM GOAL #3   Title Patient will demonstrate at least 5 degrees of bilateral ankle dorsiflexion AROM to reduce stress along the plantar aspect of the foot with walking/standing.    Time 4    Period Weeks    Status Unable to assess      PT SHORT TERM GOAL #4   Title Patient will demonstrate at least 55 degrees of bilateral great toe passive extension ROM to improve gait mechanics.    Time 4    Period Weeks    Status Unable to assess    Target Date 07/21/21               PT Long Term Goals - 06/23/21 1025       PT LONG TERM GOAL #1   Title Patient will demonstrate at least 4+/5 bilateral hip strength to improve stability about the chain with prolonged walking and standing.    Baseline see flowsheet    Status New    Target Date 08/18/21      PT LONG TERM GOAL #2   Title Patient will maintain SLS for at least 10 seconds to improve overall ankle stability necessary for walking on uneven surfaces.    Baseline see flowsheet    Status New    Target Date 08/18/21      PT LONG TERM GOAL #3   Title Patient will score at least 56% function on FOTO to signify clinically meaningful improvement in functional abilities.    Status New    Target Date 08/18/21      PT LONG TERM GOAL #4   Title Patient will report <6/10 pain upon first waking in the morning to signify improvements in her chronic condition.    Baseline 10/10    Status New    Target Date 08/18/21                   Plan - 07/12/21 6967     Clinical Impression Statement Given patient's location, pain description, and bilateral presentation further assessment of the lumbar spine was completed to determine if symptoms could be radicular in nature. She reports no referred pain with lumbar/hip musculature and lumbar spine palpation and repeated lumbar extension and flexion have no effect  on her bilateral lateral foot pain.  She has tautness and palpable tenderness about gastroc/soleus and limited metatarsal mobility bilaterally. Session focused on manual therapy and foot intrinsic strengthening. She has trigger points noted throughout gastroc and will potentially benefit from TPDN at future sessions. She has significant difficulty isolating great toe movement bilaterally Rt>Lt. No change in pain noted at end of session.    PT Treatment/Interventions ADLs/Self Care Home Management;Aquatic Therapy;Cryotherapy;Moist Heat;Ultrasound;Gait training;Stair training;Functional mobility training;Therapeutic activities;Therapeutic exercise;Balance training;Neuromuscular re-education;Patient/family education;Manual techniques;Passive range of motion;Dry needling;Taping;Vasopneumatic Device    PT Next Visit Plan Foot intrinsic strengthening, consider TPDN to gastroc/soleus, extensors    PT Home Exercise Plan Access Code: [email or web address]    Consulted and Agree with Plan of Care Patient             Patient will benefit from skilled therapeutic intervention in order to improve the following deficits and impairments:  Abnormal gait, Difficulty walking, Decreased range of motion, Pain, Decreased strength  Visit Diagnosis: Pain in left foot  Stiffness of left foot, not elsewhere classified  Stiffness of right foot, not elsewhere classified  Pain in right foot  Difficulty in walking, not elsewhere classified  Muscle weakness (generalized)     Problem List Patient Active Problem List   Diagnosis Date Noted   Herpes simplex 05/11/2020   Menopausal symptom 05/11/2020   Exertional dyspnea 01/09/2020   First degree AV block 01/08/2020   Right hand weakness 09/18/2017   Plantar fasciitis 10/10/2016   Foot pain, bilateral 09/09/2016   Radiculopathy 09/09/2016   GERD (gastroesophageal reflux disease) 09/09/2014   Hyperlipidemia 05/24/2013   Lung nodule seen on imaging study 09/21/2012    Chronic pain of multiple sites - Knees, Ankles, Back 11/09/2011   Lupus (systemic lupus erythematosus) (HCC) 06/28/2011   HYPOTHYROIDISM 04/27/2010   Depression 04/27/2010   OSTEOARTHRITIS, KNEE 04/27/2010   Back pain 04/27/2010   Diabetes mellitus type 2, uncontrolled, with complications 08/14/2007   Morbid obesity (HCC) 10/26/2006   HYPERTENSION, BENIGN SYSTEMIC 10/26/2006   VENOUS INSUFFICIENCY, CHRONIC 10/26/2006   Letitia Libra, PT, DPT, ATC 07/12/21 9:18 AM   [facility] [address] [place], [place], [address] Phone: [phone]   Fax:  [phone]  Name: Bridget [name] MRN: [id number] Date of Birth: 11-02-1960

## 2021-07-19 ENCOUNTER — Other Ambulatory Visit: Payer: Self-pay

## 2021-07-19 ENCOUNTER — Ambulatory Visit: Payer: BC Managed Care – PPO

## 2021-07-19 DIAGNOSIS — M25674 Stiffness of right foot, not elsewhere classified: Secondary | ICD-10-CM

## 2021-07-19 DIAGNOSIS — M79672 Pain in left foot: Secondary | ICD-10-CM

## 2021-07-19 DIAGNOSIS — R262 Difficulty in walking, not elsewhere classified: Secondary | ICD-10-CM

## 2021-07-19 DIAGNOSIS — M25675 Stiffness of left foot, not elsewhere classified: Secondary | ICD-10-CM

## 2021-07-19 DIAGNOSIS — M79671 Pain in right foot: Secondary | ICD-10-CM

## 2021-07-19 DIAGNOSIS — M6281 Muscle weakness (generalized): Secondary | ICD-10-CM

## 2021-07-19 NOTE — Patient Instructions (Signed)

## 2021-07-19 NOTE — Therapy (Addendum)
[place], [place], [address] Phone: [phone]   Fax:  [phone]  Physical Therapy Treatment/Discharge  Patient Details  Name: Bridget [name] MRN: [id number] Date of Birth: 1961/01/25 Referring Provider (PT): Trula Slade, [place]   Encounter Date: 07/19/2021   PT End of Session - 07/19/21 0806     Visit Number 4    Number of Visits 17    Date for PT Re-Evaluation 08/21/21    Authorization Type BCBS    PT Start Time 0806   patient late   PT Stop Time 0845   1 min TPDN   PT Time Calculation (min) 39 min    Activity Tolerance Patient tolerated treatment well    Behavior During Therapy [facility] for tasks assessed/performed             Past Medical History:  Diagnosis Date   Bacterial vaginosis 12/07/2010   Depression    Diabetes mellitus without complication ([place])    Diverticulosis    DIVERTICULOSIS, COLON 10/26/2006   Fibromyalgia    GERD (gastroesophageal reflux disease)    Glaucoma    Headache    HEEL SPUR 09/10/2007   Qualifier: Diagnosis of  By: Drue Flirt  MD, Taineisha     Hypertension    MALAISE AND FATIGUE 05/27/2010   Obesity    OBESITY, NOS 10/26/2006   Osteoarthritis    OSTEOARTHRITIS, KNEE 04/27/2010   Retained tampon 05/21/2012   SHOULDER PAIN, LEFT 05/19/2010   Qualifier: Diagnosis of  By: Annamary Carolin MD, Amber     Vaginal yeast infection 12/08/2010    Past Surgical History:  Procedure Laterality Date   ABDOMINAL HYSTERECTOMY  1990   ABDOMINAL HYSTERECTOMY     BLADDER SURGERY  2001   CATARACT EXTRACTION Right 2001   with implant   EYE SURGERY     implant rt eye   ROTATOR CUFF REPAIR Right    SHOULDER ARTHROSCOPY WITH SUBACROMIAL DECOMPRESSION Left 07/20/2015   Procedure: SHOULDER ARTHROSCOPY WITH DEBRIDEMENT OF ROTATOR CUFF AND SUBACROMIAL DECOMPRESSION ;  Surgeon: Tania Ade, MD;  Location: [place];  Service: Orthopedics;  Laterality: Left;  Left  shoulder arthroscopy with debridement of rotator cuff and subacromial decompression    There were no vitals filed for this visit.   Subjective Assessment - 07/19/21 0808     Subjective "I'm real stiff this morning. My body locks up when I'm cold. This morning they were hurting, but since I have been walking around on them it's between a 4/5. It hurts a little bit, not much."    Currently in Pain? Yes    Pain Score 5     Pain Location Foot    Pain Orientation Left;Right;Lateral    Pain Descriptors / Indicators Dull    Pain Type Chronic pain    Pain Onset More than a month ago    Pain Frequency Constant    Aggravating Factors  being on my feet    Pain Relieving Factors being off my feet                [facility] PT Assessment - 07/19/21 0001       AROM   Right Ankle Dorsiflexion 3    Left Ankle Dorsiflexion 3            [facility] Adult PT Treatment/Exercise:   Therapeutic Exercise: - 4 way ankle bilateral 1 x 10 yellow band - updated HEP to include 4 way ankle  -  Standing calf raise with ball between ankles 2 x 10    Manual Therapy: - STM/DTM Trigger point release bilateral gastroc/soleus - Passive gastroc stretching bilaterally  - metatarsal mobilizations A/P bilaterally   Neuromuscular re-ed: - n/a   Therapeutic Activity: - n/a   Self-care/Home Management: - Reviewed TPDN education, handout provided.                  Trigger Point Dry Needling - 07/19/21 0001     Consent Given? Yes    Education Handout Provided Yes    Muscles Treated Lower Quadrant Gastrocnemius   bilateral   Gastrocnemius Response Twitch response elicited;Palpable increased muscle length                     PT Short Term Goals - 07/06/21 1414       PT SHORT TERM GOAL #1   Title Patient will be independent with initial HEP.    Baseline issued at eval; 07/05/21 reports compliance    Time 4    Period Weeks    Status Achieved    Target Date 07/07/21      PT  SHORT TERM GOAL #2   Title Therapist will review FOTO and anticipated progress.    Baseline FOTO captured; reviewed on11/8/22    Time 4    Period Weeks    Status Achieved      PT SHORT TERM GOAL #3   Title Patient will demonstrate at least 5 degrees of bilateral ankle dorsiflexion AROM to reduce stress along the plantar aspect of the foot with walking/standing.    Time 4    Period Weeks    Status Unable to assess      PT SHORT TERM GOAL #4   Title Patient will demonstrate at least 55 degrees of bilateral great toe passive extension ROM to improve gait mechanics.    Time 4    Period Weeks    Status Unable to assess    Target Date 07/21/21               PT Long Term Goals - 06/23/21 1025       PT LONG TERM GOAL #1   Title Patient will demonstrate at least 4+/5 bilateral hip strength to improve stability about the chain with prolonged walking and standing.    Baseline see flowsheet    Status New    Target Date 08/18/21      PT LONG TERM GOAL #2   Title Patient will maintain SLS for at least 10 seconds to improve overall ankle stability necessary for walking on uneven surfaces.    Baseline see flowsheet    Status New    Target Date 08/18/21      PT LONG TERM GOAL #3   Title Patient will score at least 56% function on FOTO to signify clinically meaningful improvement in functional abilities.    Status New    Target Date 08/18/21      PT LONG TERM GOAL #4   Title Patient will report <6/10 pain upon first waking in the morning to signify improvements in her chronic condition.    Baseline 10/10    Status New    Target Date 08/18/21                   Plan - 07/19/21 5277     Clinical Impression Statement Patient tolerated session well today without reports of increased pain. She has significant tautness and palpable  tenderness about bilateral lateral gastrocnemius with reduced tautness and tenderness following TPDN. She has significant difficulty isolating  inversion and eversion ROM bilaterally as she has tendency to compensate with knee movement and quickly fatigues when she is able to properly activate.    PT Treatment/Interventions ADLs/Self Care Home Management;Aquatic Therapy;Cryotherapy;Moist Heat;Ultrasound;Gait training;Stair training;Functional mobility training;Therapeutic activities;Therapeutic exercise;Balance training;Neuromuscular re-education;Patient/family education;Manual techniques;Passive range of motion;Dry needling;Taping;Vasopneumatic Device    PT Next Visit Plan Foot intrinsic strengthening, consider TPDN to gastroc/soleus, extensors    PT Home Exercise Plan Access Code: [email or web address]    Consulted and Agree with Plan of Care Patient             Patient will benefit from skilled therapeutic intervention in order to improve the following deficits and impairments:  Abnormal gait, Difficulty walking, Decreased range of motion, Pain, Decreased strength  Visit Diagnosis: Pain in left foot  Stiffness of left foot, not elsewhere classified  Stiffness of right foot, not elsewhere classified  Pain in right foot  Difficulty in walking, not elsewhere classified  Muscle weakness (generalized)     Problem List Patient Active Problem List   Diagnosis Date Noted   Herpes simplex 05/11/2020   Menopausal symptom 05/11/2020   Exertional dyspnea 01/09/2020   First degree AV block 01/08/2020   Right hand weakness 09/18/2017   Plantar fasciitis 10/10/2016   Foot pain, bilateral 09/09/2016   Radiculopathy 09/09/2016   GERD (gastroesophageal reflux disease) 09/09/2014   Hyperlipidemia 05/24/2013   Lung nodule seen on imaging study 09/21/2012   Chronic pain of multiple sites - Knees, Ankles, Back 11/09/2011   Lupus (systemic lupus erythematosus) ([place]) 06/28/2011   HYPOTHYROIDISM 04/27/2010   Depression 04/27/2010   OSTEOARTHRITIS, KNEE 04/27/2010   Back pain 04/27/2010   Diabetes mellitus type 2, uncontrolled, with  complications 32/91/9166   Morbid obesity ([place]) 10/26/2006   HYPERTENSION, BENIGN SYSTEMIC 10/26/2006   VENOUS INSUFFICIENCY, CHRONIC 10/26/2006   Gwendolyn Grant, PT, DPT, ATC 07/19/21 9:02 AM  PHYSICAL THERAPY DISCHARGE SUMMARY  Visits from Start of Care: 4  Current functional level related to goals / functional outcomes: See goals above.   Remaining deficits: Status unknown.   Education / Equipment: N/A   Patient agrees to discharge. Patient goals were partially met. Patient is being discharged due to not returning since the last visit. Gwendolyn Grant, PT, DPT, ATC 09/13/21 2:31 PM  [facility] [address] [place], [place], [address] Phone: [phone]   Fax:  [phone]  Name: Bridget [name] MRN: [id number] Date of Birth: 1960-09-05

## 2021-07-26 ENCOUNTER — Ambulatory Visit: Payer: BC Managed Care – PPO

## 2021-08-10 ENCOUNTER — Other Ambulatory Visit: Payer: Self-pay | Admitting: Family Medicine

## 2021-08-10 DIAGNOSIS — K5792 Diverticulitis of intestine, part unspecified, without perforation or abscess without bleeding: Secondary | ICD-10-CM

## 2021-08-19 ENCOUNTER — Ambulatory Visit
Admission: RE | Admit: 2021-08-19 | Discharge: 2021-08-19 | Disposition: A | Discharge status: Home / Self Care | Payer: BC Managed Care – PPO | Source: Ambulatory Visit | Attending: Family Medicine | Admitting: Family Medicine

## 2021-08-19 DIAGNOSIS — Z1231 Encounter for screening mammogram for malignant neoplasm of breast: Secondary | ICD-10-CM

## 2021-09-06 ENCOUNTER — Ambulatory Visit
Admission: RE | Admit: 2021-09-06 | Discharge: 2021-09-06 | Disposition: A | Discharge status: Home / Self Care | Payer: BC Managed Care – PPO | Source: Ambulatory Visit | Attending: Family Medicine | Admitting: Family Medicine

## 2021-09-06 ENCOUNTER — Other Ambulatory Visit: Payer: Self-pay

## 2021-09-06 DIAGNOSIS — K5792 Diverticulitis of intestine, part unspecified, without perforation or abscess without bleeding: Secondary | ICD-10-CM

## 2021-09-06 MED ORDER — IOPAMIDOL (ISOVUE-300) INJECTION 61%
100.0000 mL | Freq: Once | INTRAVENOUS | Status: AC | PRN
  Administered 2021-09-06: 100 mL via INTRAVENOUS

## 2021-09-08 ENCOUNTER — Ambulatory Visit: Payer: BC Managed Care – PPO | Admitting: Podiatry

## 2021-09-09 ENCOUNTER — Ambulatory Visit: Payer: BC Managed Care – PPO | Admitting: Podiatry

## 2021-09-20 ENCOUNTER — Other Ambulatory Visit: Payer: Self-pay

## 2021-09-20 ENCOUNTER — Ambulatory Visit (INDEPENDENT_AMBULATORY_CARE_PROVIDER_SITE_OTHER): Payer: BC Managed Care – PPO | Admitting: Endocrinology

## 2021-09-20 ENCOUNTER — Encounter: Payer: BC Managed Care – PPO | Attending: Endocrinology | Admitting: Nutrition

## 2021-09-20 VITALS — BP 138/78 | HR 79 | Ht 65.5 in | Wt 206.6 lb

## 2021-09-20 DIAGNOSIS — E1169 Type 2 diabetes mellitus with other specified complication: Secondary | ICD-10-CM | POA: Diagnosis present

## 2021-09-20 DIAGNOSIS — Z794 Long term (current) use of insulin: Secondary | ICD-10-CM

## 2021-09-20 DIAGNOSIS — E1142 Type 2 diabetes mellitus with diabetic polyneuropathy: Secondary | ICD-10-CM

## 2021-09-20 MED ORDER — FREESTYLE LITE DEVI: 1.0000 | Freq: Once | 0 refills | Status: AC

## 2021-09-20 NOTE — Patient Instructions (Signed)
Discussion: Stop all cold cereal and milk.   Stop all Honey in coffee.

## 2021-09-20 NOTE — Progress Notes (Signed)
Subjective:    Patient ID: Bridget [name], female    DOB: 12-24-1960, 61 y.o.   MRN: [id number]  HPI Pt returns for f/u of diabetes mellitus: DM type: Insulin-requiring type 2.  Dx'ed: [id number] Complications: PN Therapy: Trulicity.   GDM: never.  DKA: never.   Severe hypoglycemia: never.   Pancreatitis: never.  Pancreatic imaging: normal on 2023 CT SDOH: she works 3rd shift.  Other: she did not tolerate invokana (vaginitis) or metformin-XR (diarrhea); edema precludes pioglitizone rx.  she also took insulin from 2000-2003; She declined multiple daily injections.   Interval history: pt says cbg varies from 120-140.  She checks fasting only.  pt states she feels well in general.   Past Medical History:  Diagnosis Date   Bacterial vaginosis 12/07/2010   Depression    Diabetes mellitus without complication ([place])    Diverticulosis    DIVERTICULOSIS, COLON 10/26/2006   Fibromyalgia    GERD (gastroesophageal reflux disease)    Glaucoma    Headache    HEEL SPUR 09/10/2007   Qualifier: Diagnosis of  By: Drue Flirt  MD, Taineisha     Hypertension    MALAISE AND FATIGUE 05/27/2010   Obesity    OBESITY, NOS 10/26/2006   Osteoarthritis    OSTEOARTHRITIS, KNEE 04/27/2010   Retained tampon 05/21/2012   SHOULDER PAIN, LEFT 05/19/2010   Qualifier: Diagnosis of  By: Annamary Carolin MD, Amber     Vaginal yeast infection 12/08/2010    Past Surgical History:  Procedure Laterality Date   ABDOMINAL HYSTERECTOMY  1990   ABDOMINAL HYSTERECTOMY     BLADDER SURGERY  2001   CATARACT EXTRACTION Right 2001   with implant   EYE SURGERY     implant rt eye   ROTATOR CUFF REPAIR Right    SHOULDER ARTHROSCOPY WITH SUBACROMIAL DECOMPRESSION Left 07/20/2015   Procedure: SHOULDER ARTHROSCOPY WITH DEBRIDEMENT OF ROTATOR CUFF AND SUBACROMIAL DECOMPRESSION ;  Surgeon: Tania Ade, MD;  Location: [facility];  Service: Orthopedics;  Laterality: Left;  Left shoulder arthroscopy with debridement of rotator  cuff and subacromial decompression    Social History   Socioeconomic History   Marital status: Divorced    Spouse name: Not on file   Number of children: 2   Years of education: Not on file   Highest education level: Not on file  Occupational History   Occupation: PBX Mining engineer    Employer: [employer]  Tobacco Use   Smoking status: Former    Packs/day: 0.30    Years: 20.00    Pack years: 6.00    Types: Cigarettes    Quit date: 08/29/1998    Years since quitting: 23.0   Smokeless tobacco: Never   Tobacco comments:    Quit in 2000   Vaping Use   Vaping Use: Never used  Substance and Sexual Activity   Alcohol use: Yes    Comment: socially   Drug use: No   Sexual activity: Yes    Birth control/protection: Surgical  Other Topics Concern   Not on file  Social History Narrative   Works at [employer] as a Museum/gallery conservator   Social Determinants of Radio broadcast assistant Strain: Not on file  Food Insecurity: Not on file  Transportation Needs: Not on file  Physical Activity: Not on file  Stress: Not on file  Social Connections: Not on file  Intimate Partner Violence: Not on file    Current Outpatient Medications on File Prior to Visit  Medication Sig  Dispense Refill   aspirin 81 MG EC tablet Take by mouth.     aspirin 81 MG tablet Take 1 tablet (81 mg total) by mouth daily.     BD PEN NEEDLE NANO 2ND GEN 32G X 4 MM MISC USE AS DIRECTED 4 TIMES A DAY 100 each 0   brimonidine-timolol (COMBIGAN) 0.2-0.5 % ophthalmic solution INSTILL 1 DROP INTO BOTH EYES TWICE A DAY AS DIRECTED     cetirizine (ZYRTEC) 10 MG tablet Take 1 tablet (10 mg total) by mouth daily. 30 tablet 11   docusate sodium (COLACE) 100 MG capsule Take 1 capsule (100 mg total) by mouth 3 (three) times daily as needed. 20 capsule 0   dorzolamide (TRUSOPT) 2 % ophthalmic solution Place 1 drop into both eyes 2 (two) times daily.     Dulaglutide (TRULICITY) 1.5 0000000 SOPN Inject 1.5 mg into the skin once a  week. 6 mL 3   fluticasone (FLONASE) 50 MCG/ACT nasal spray Place 2 sprays into both nostrils daily. 16 g 0   FREESTYLE LITE test strip USE TO TEST TWICE A DAY 100 strip 0   furosemide (LASIX) 20 MG tablet Take by mouth.     gabapentin (NEURONTIN) 300 MG capsule Take by mouth.     hydroxychloroquine (PLAQUENIL) 200 MG tablet      Lancets (FREESTYLE) lancets USE TO CHECK BLOOD SUGAR TWICE DAILY. 100 each 0   latanoprost (XALATAN) 0.005 % ophthalmic solution 1 drop at bedtime.     levocetirizine (XYZAL) 5 MG tablet Take by mouth.     lisinopril (ZESTRIL) 5 MG tablet Take 1 tablet (5 mg total) by mouth daily. 90 tablet 3   meloxicam (MOBIC) 15 MG tablet TAKE 1 TABLET BY MOUTH EVERY DAY 30 tablet 0   meloxicam (MOBIC) 15 MG tablet Take by mouth.     morphine (MSIR) 15 MG tablet Take by mouth.     Omega 3-6-9 Fatty Acids (TRIPLE OMEGA-3-6-9) CAPS Take 1 capsule by mouth daily.     omeprazole (PRILOSEC) 20 MG capsule Take by mouth.     pantoprazole (PROTONIX) 20 MG tablet TAKE 1 TABLET BY MOUTH DAILY. 90 tablet 3   pantoprazole (PROTONIX) 20 MG tablet Take 1 tablet by mouth daily.     predniSONE (DELTASONE) 5 MG tablet 3 tablet     pregabalin (LYRICA) 150 MG capsule TAKE 1 CAPSULE BY MOUTH 2 TIMES DAILY. 60 capsule 0   ROCKLATAN 0.02-0.005 % SOLN SMARTSIG:1 Drop(s) In Eye(s) Every Evening     sitaGLIPtin (JANUVIA) 100 MG tablet Take by mouth.     traMADol (ULTRAM) 50 MG tablet Take 1 tablet (50 mg total) by mouth every 6 (six) hours as needed. 15 tablet 0   triamcinolone (NASACORT AQ) 55 MCG/ACT AERO nasal inhaler Place 2 sprays into the nose daily. 1 Inhaler 12   No current facility-administered medications on file prior to visit.    Allergies  Allergen Reactions   Sulfa Antibiotics Hives and Rash   Citrus Itching   Invokana [Canagliflozin] Other (See Comments)    Caused frequent vaginal yeast infections   Codeine Hives   Metformin And Related Diarrhea and Nausea And Vomiting    Metoclopramide Hcl Nausea And Vomiting   Penicillins Nausea And Vomiting   Propoxyphene Nausea And Vomiting    GI upset   Propoxyphene N-Acetaminophen Nausea And Vomiting   Sulfonamide Derivatives Hives    Family History  Problem Relation Age of Onset   Breast cancer Mother  Diabetes Mother    Cancer Mother    Ovarian cancer Maternal Grandmother    Heart disease Maternal Grandmother        great   Stomach cancer Cousin    Diabetes Brother        x 3   Hypertension Brother    Alcohol abuse Father    Diabetes Brother    Diabetes Brother    Hypertension Brother    Hypertension Sister    Diabetes Sister     BP 138/78    Pulse 79    Ht 5' 5.5" (1.664 m)    Wt 206 lb 9.6 oz (93.7 kg)    SpO2 97%    BMI 33.86 kg/m    Review of Systems Denies N/V/HB    Objective:   Physical Exam VITAL SIGNS:  See vs page GENERAL: no distress Pulses: dorsalis pedis intact bilat.   MSK: no deformity of the feet CV: trace bilat leg edema Skin:  no ulcer on the feet.  normal color and temp on the feet. Neuro: sensation is intact to touch on the feet   A1c=6.1%    Assessment & Plan:  Insulin-requiring type 2 DM: well-controlled.  Patient Instructions  Please continue the same Trulicity.  check your blood sugar twice a day.  vary the time of day when you check, between before the 3 meals, and at bedtime.  also check if you have symptoms of your blood sugar being too high or too low.  please keep a record of the readings and bring it to your next appointment here (or you can bring the meter itself).  You can write it on any piece of paper.  please call us sooner if your blood sugar goes below 70, or if you have a lot of readings over 200.   Please come back for a follow-up appointment in 6 months.

## 2021-09-20 NOTE — Progress Notes (Signed)
Patient was shown how to use the insulin pen.  We discussed storage of the insulin, where to inject, and how to dial the dose and inject the insulin.  She reported good understanding of this. Pt. Reports that she does not eat very much in a day Bfast:  9 AM: coffee with 2T of honey, cold cereal and milk-Rice Krispies, or cornflakes,   2PM: soup, or nothing, but snacks on nuts 5:30PM: meat, 4-6 ounces, 3 non starchy veg., no bread 8PM:: popcorn or piece of fruit.  Drinks unsweet tea or water during the day and with meals. Discussion: Stop all cold cereal and milk.  Other suggestions given for breakfast like oatmeal with nuts, or toast with peanut butter or cheese. STop all Honey in coffee.   Discussed that every meal needs protein, carbs and small amounts of fat.  Discussed foods go in each food group and quanties needed at each meal.   She reported good understanding of this

## 2021-09-20 NOTE — Patient Instructions (Addendum)
Please continue the same Trulicity.  check your blood sugar twice a day.  vary the time of day when you check, between before the 3 meals, and at bedtime.  also check if you have symptoms of your blood sugar being too high or too low.  please keep a record of the readings and bring it to your next appointment here (or you can bring the meter itself).  You can write it on any piece of paper.  please call us sooner if your blood sugar goes below 70, or if you have a lot of readings over 200.   Please come back for a follow-up appointment in 6 months.

## 2021-09-21 DIAGNOSIS — E119 Type 2 diabetes mellitus without complications: Secondary | ICD-10-CM | POA: Insufficient documentation

## 2021-10-04 ENCOUNTER — Ambulatory Visit (INDEPENDENT_AMBULATORY_CARE_PROVIDER_SITE_OTHER): Payer: BC Managed Care – PPO

## 2021-10-04 ENCOUNTER — Ambulatory Visit: Payer: BC Managed Care – PPO

## 2021-10-04 ENCOUNTER — Ambulatory Visit (INDEPENDENT_AMBULATORY_CARE_PROVIDER_SITE_OTHER): Payer: BC Managed Care – PPO | Admitting: Podiatry

## 2021-10-04 ENCOUNTER — Other Ambulatory Visit: Payer: Self-pay

## 2021-10-04 DIAGNOSIS — E1149 Type 2 diabetes mellitus with other diabetic neurological complication: Secondary | ICD-10-CM

## 2021-10-04 DIAGNOSIS — M722 Plantar fascial fibromatosis: Secondary | ICD-10-CM | POA: Diagnosis not present

## 2021-10-04 DIAGNOSIS — M7750 Other enthesopathy of unspecified foot: Secondary | ICD-10-CM

## 2021-10-04 DIAGNOSIS — M79672 Pain in left foot: Secondary | ICD-10-CM

## 2021-10-04 DIAGNOSIS — M79671 Pain in right foot: Secondary | ICD-10-CM

## 2021-10-04 DIAGNOSIS — L84 Corns and callosities: Secondary | ICD-10-CM

## 2021-10-04 NOTE — Progress Notes (Signed)
Patient seen for evaluation for foot orthotics. Patient reports she would prefer insurance benefits be verified and coverage determined before casting. Patient understands her insurance requires a physician's visit and co-pay for coverage and would like to wait until her next scheduled appointment with Dr. Ardelle Anton to cast. All questions answered and concerns addressed. Insurance benefits and coverage to be verified. Plan of care agreed upon by patient.

## 2021-10-04 NOTE — Progress Notes (Signed)
Subjective: 61 year old female presents the office today for follow-up evaluation of bilateral foot pain.  She said that she is doing the same compared to last appointment.  She said that she is on her feet 8 hours a day for work and she gets pain when assessing her feet.  She is still on Lyrica.  She is on Celebrex that was prescribed by her primary care physician.  Discussed an aching sensation.  No recent injury or changes otherwise to last saw her.  Objective: AAO x3, NAD DP/PT pulses palpable bilaterally, CRT less than 3 seconds There is tenderness palpation along the plantar aspect along the arch of the foot and the plantar fascia.  Is also mild discomfort on the lateral aspect along course the peroneal tendon.  There is no pain with lateral compression of calcaneus.  No edema.  Flexor, extensor tendons appear to be intact. No pain with calf compression, swelling, warmth, erythema  Assessment: Bilateral foot pain, tendinitis/plan fasciitis with neuropathy  Plan: -All treatment options discussed with the patient including all alternatives, risks, complications.  -Repeat x-rays obtained reviewed.  No evidence of acute fracture.  Calcaneal spurring is noted. -Continue Lyrica. -She was interested in inserts for shoes.  I will have her see our orthotist, Arlys John for this today.  In the meantime we discussed wearing shoes and good arch support.  Discussed stretching exercises to be performed daily as well.  Continue Celebrex she is already taking.  Consider physical therapy. -Patient encouraged to call the office with any questions, concerns, change in symptoms.   Vivi Barrack DPM

## 2021-10-11 ENCOUNTER — Other Ambulatory Visit: Payer: BC Managed Care – PPO

## 2021-10-20 ENCOUNTER — Other Ambulatory Visit: Payer: Self-pay | Admitting: Podiatry

## 2021-10-20 DIAGNOSIS — M722 Plantar fascial fibromatosis: Secondary | ICD-10-CM

## 2021-12-07 ENCOUNTER — Other Ambulatory Visit: Payer: Self-pay | Admitting: Endocrinology

## 2021-12-22 ENCOUNTER — Ambulatory Visit (INDEPENDENT_AMBULATORY_CARE_PROVIDER_SITE_OTHER): Payer: BC Managed Care – PPO | Admitting: Endocrinology

## 2021-12-22 VITALS — BP 132/78 | HR 66 | Ht 65.5 in | Wt 202.6 lb

## 2021-12-22 DIAGNOSIS — E1169 Type 2 diabetes mellitus with other specified complication: Secondary | ICD-10-CM | POA: Diagnosis not present

## 2021-12-22 LAB — POCT GLYCOSYLATED HEMOGLOBIN (HGB A1C): Hemoglobin A1C: 6 % — AB (ref 4.0–5.6)

## 2021-12-22 MED ORDER — TRULICITY 1.5 MG/0.5ML ~~LOC~~ SOAJ: 1.5000 mg | SUBCUTANEOUS | 3 refills | Status: DC

## 2021-12-22 NOTE — Patient Instructions (Addendum)
Please continue the same Trulicity.   ?check your blood sugar twice a day.  vary the time of day when you check, between before the 3 meals, and at bedtime.  also check if you have symptoms of your blood sugar being too high or too low.  please keep a record of the readings and bring it to your next appointment here (or you can bring the meter itself).  You can write it on any piece of paper.  please call us sooner if your blood sugar goes below 70, or if you have a lot of readings over 200.   ?Please see Dr Parke Simmers to follow the diabetes in 6 months.   ?

## 2021-12-22 NOTE — Progress Notes (Signed)
? ?Subjective:  ? ? Patient ID: Bridget [name], female    DOB: [birth year], 61 y.o.   MRN: [id number] ? ?HPI ?Pt returns for f/u of diabetes mellitus:  ?DM type: 2 ?Dx'ed: 2000 ?Complications: PN ?Therapy: Trulicity.   ?GDM: never.  ?DKA: never.   ?Severe hypoglycemia: never.   ?Pancreatitis: never.  ?Pancreatic imaging: normal on 2023 CT ?SDOH: none ?Other: she did not tolerate invokana (vaginitis) or metformin-XR (diarrhea); edema precludes pioglitizone rx.  she also took insulin from 2000-2003; She declined multiple daily injections.   ?Interval history: pt says cbg's are in the 100's.  She checks fasting only.  pt states she feels well in general.   ?Past Medical History:  ?Diagnosis Date  ? Bacterial vaginosis 12/07/2010  ? Depression   ? Diabetes mellitus without complication ([place])   ? Diverticulosis   ? DIVERTICULOSIS, COLON 10/26/2006  ? Fibromyalgia   ? GERD (gastroesophageal reflux disease)   ? Glaucoma   ? Headache   ? HEEL SPUR 09/10/2007  ? Qualifier: Diagnosis of  By: Drue Flirt  MD, Merrily Brittle    ? Hypertension   ? MALAISE AND FATIGUE 05/27/2010  ? Obesity   ? OBESITY, NOS 10/26/2006  ? Osteoarthritis   ? OSTEOARTHRITIS, KNEE 04/27/2010  ? Retained tampon 05/21/2012  ? SHOULDER PAIN, LEFT 05/19/2010  ? Qualifier: Diagnosis of  By: Annamary Carolin MD, Amber    ? Vaginal yeast infection 12/08/2010  ? ? ?Past Surgical History:  ?Procedure Laterality Date  ? ABDOMINAL HYSTERECTOMY  1990  ? ABDOMINAL HYSTERECTOMY    ? BLADDER SURGERY  2001  ? CATARACT EXTRACTION Right 2001  ? with implant  ? EYE SURGERY    ? implant rt eye  ? ROTATOR CUFF REPAIR Right   ? SHOULDER ARTHROSCOPY WITH SUBACROMIAL DECOMPRESSION Left 07/20/2015  ? Procedure: SHOULDER ARTHROSCOPY WITH DEBRIDEMENT OF ROTATOR CUFF AND SUBACROMIAL DECOMPRESSION ;  Surgeon: Tania Ade, MD;  Location: [facility];  Service: Orthopedics;  Laterality: Left;  Left shoulder arthroscopy with debridement of rotator cuff and subacromial decompression   ? ? ?Social History  ? ?Socioeconomic History  ? Marital status: Divorced  ?  Spouse name: Not on file  ? Number of children: 2  ? Years of education: Not on file  ? Highest education level: Not on file  ?Occupational History  ? Occupation: Electrical engineer  ?  Employer: [employer]  ?Tobacco Use  ? Smoking status: Former  ?  Packs/day: 0.30  ?  Years: 20.00  ?  Pack years: 6.00  ?  Types: Cigarettes  ?  Quit date: 08/29/1998  ?  Years since quitting: 23.3  ? Smokeless tobacco: Never  ? Tobacco comments:  ?  Quit in 2000   ?Vaping Use  ? Vaping Use: Never used  ?Substance and Sexual Activity  ? Alcohol use: Yes  ?  Comment: socially  ? Drug use: No  ? Sexual activity: Yes  ?  Birth control/protection: Surgical  ?Other Topics Concern  ? Not on file  ?Social History Narrative  ? Works at [employer] as a Museum/gallery conservator  ? ?Social Determinants of Health  ? ?Financial Resource Strain: Not on file  ?Food Insecurity: Not on file  ?Transportation Needs: Not on file  ?Physical Activity: Not on file  ?Stress: Not on file  ?Social Connections: Not on file  ?Intimate Partner Violence: Not on file  ? ? ?Current Outpatient Medications on File Prior to Visit  ?Medication Sig Dispense Refill  ? aspirin  81 MG EC tablet Take by mouth.    ? aspirin 81 MG tablet Take 1 tablet (81 mg total) by mouth daily.    ? BD PEN NEEDLE NANO 2ND GEN 32G X 4 MM MISC USE AS DIRECTED 4 TIMES A DAY 100 each 0  ? brimonidine-timolol (COMBIGAN) 0.2-0.5 % ophthalmic solution INSTILL 1 DROP INTO BOTH EYES TWICE A DAY AS DIRECTED    ? cetirizine (ZYRTEC) 10 MG tablet Take 1 tablet (10 mg total) by mouth daily. 30 tablet 11  ? docusate sodium (COLACE) 100 MG capsule Take 1 capsule (100 mg total) by mouth 3 (three) times daily as needed. 20 capsule 0  ? dorzolamide (TRUSOPT) 2 % ophthalmic solution Place 1 drop into both eyes 2 (two) times daily.    ? fluticasone (FLONASE) 50 MCG/ACT nasal spray Place 2 sprays into both nostrils daily. 16 g 0  ? FREESTYLE LITE  test strip USE TO TEST TWICE A DAY 100 strip 0  ? furosemide (LASIX) 20 MG tablet Take by mouth.    ? gabapentin (NEURONTIN) 300 MG capsule Take by mouth.    ? hydroxychloroquine (PLAQUENIL) 200 MG tablet     ? Lancets (FREESTYLE) lancets USE TO CHECK BLOOD SUGAR TWICE DAILY. 100 each 0  ? latanoprost (XALATAN) 0.005 % ophthalmic solution 1 drop at bedtime.    ? levocetirizine (XYZAL) 5 MG tablet Take by mouth.    ? lisinopril (ZESTRIL) 5 MG tablet Take 1 tablet (5 mg total) by mouth daily. 90 tablet 3  ? meloxicam (MOBIC) 15 MG tablet TAKE 1 TABLET BY MOUTH EVERY DAY 30 tablet 0  ? meloxicam (MOBIC) 15 MG tablet Take by mouth.    ? morphine (MSIR) 15 MG tablet Take by mouth.    ? Omega 3-6-9 Fatty Acids (TRIPLE OMEGA-3-6-9) CAPS Take 1 capsule by mouth daily.    ? omeprazole (PRILOSEC) 20 MG capsule Take by mouth.    ? pantoprazole (PROTONIX) 20 MG tablet TAKE 1 TABLET BY MOUTH DAILY. 90 tablet 3  ? pantoprazole (PROTONIX) 20 MG tablet Take 1 tablet by mouth daily.    ? predniSONE (DELTASONE) 5 MG tablet 3 tablet    ? pregabalin (LYRICA) 150 MG capsule TAKE 1 CAPSULE BY MOUTH 2 TIMES DAILY. 60 capsule 0  ? ROCKLATAN 0.02-0.005 % SOLN SMARTSIG:1 Drop(s) In Eye(s) Every Evening    ? sitaGLIPtin (JANUVIA) 100 MG tablet Take by mouth.    ? traMADol (ULTRAM) 50 MG tablet Take 1 tablet (50 mg total) by mouth every 6 (six) hours as needed. 15 tablet 0  ? triamcinolone (NASACORT AQ) 55 MCG/ACT AERO nasal inhaler Place 2 sprays into the nose daily. 1 Inhaler 12  ? ?No current facility-administered medications on file prior to visit.  ? ? ?Allergies  ?Allergen Reactions  ? Sulfa Antibiotics Hives and Rash  ? Citrus Itching  ? Invokana [Canagliflozin] Other (See Comments)  ?  Caused frequent vaginal yeast infections  ? Codeine Hives  ? Metformin And Related Diarrhea and Nausea And Vomiting  ? Metoclopramide Hcl Nausea And Vomiting  ? Penicillins Nausea And Vomiting  ? Propoxyphene Nausea And Vomiting  ?  GI upset  ?  Propoxyphene N-Acetaminophen Nausea And Vomiting  ? Sulfonamide Derivatives Hives  ? ? ?Family History  ?Problem Relation Age of Onset  ? Breast cancer Mother   ? Diabetes Mother   ? Cancer Mother   ? Ovarian cancer Maternal Grandmother   ? Heart disease Maternal Grandmother   ?  great  ? Stomach cancer Cousin   ? Diabetes Brother   ?     x 3  ? Hypertension Brother   ? Alcohol abuse Father   ? Diabetes Brother   ? Diabetes Brother   ? Hypertension Brother   ? Hypertension Sister   ? Diabetes Sister   ? ? ?BP 132/78 (BP Location: Left Arm, Patient Position: Sitting, Cuff Size: Normal)   Pulse 66   Ht 5' 5.5" (1.664 m)   Wt 202 lb 9.6 oz (91.9 kg)   SpO2 97%   BMI 33.20 kg/m?  ? ?Review of Systems ?Denies N/HB ?   ?Objective:  ? Physical Exam ?VITAL SIGNS:  See vs page.   ?GENERAL: no distress.   ? ?Lab Results  ?Component Value Date  ? CREATININE 0.76 09/07/2018  ? BUN 12 09/07/2018  ? NA 139 09/07/2018  ? K 4.1 09/07/2018  ? CL 106 09/07/2018  ? CO2 27 09/07/2018  ? ? ?Lab Results  ?Component Value Date  ? HGBA1C 6.0 (A) 12/22/2021  ? ?   ?Assessment & Plan:  ?type 2 DM: well-controlled.  ? ?Patient Instructions  ?Please continue the same Trulicity.   ?check your blood sugar twice a day.  vary the time of day when you check, between before the 3 meals, and at bedtime.  also check if you have symptoms of your blood sugar being too high or too low.  please keep a record of the readings and bring it to your next appointment here (or you can bring the meter itself).  You can write it on any piece of paper.  please call us sooner if your blood sugar goes below 70, or if you have a lot of readings over 200.   ?Please see Dr Criss Rosales to follow the diabetes in 6 months.   ? ? ?

## 2022-01-03 ENCOUNTER — Ambulatory Visit: Payer: BC Managed Care – PPO | Admitting: Podiatry

## 2022-01-03 ENCOUNTER — Other Ambulatory Visit: Payer: BC Managed Care – PPO

## 2022-01-11 ENCOUNTER — Ambulatory Visit (INDEPENDENT_AMBULATORY_CARE_PROVIDER_SITE_OTHER): Payer: BC Managed Care – PPO | Admitting: Podiatry

## 2022-01-11 DIAGNOSIS — R252 Cramp and spasm: Secondary | ICD-10-CM

## 2022-01-11 DIAGNOSIS — M722 Plantar fascial fibromatosis: Secondary | ICD-10-CM | POA: Diagnosis not present

## 2022-01-11 DIAGNOSIS — M7750 Other enthesopathy of unspecified foot: Secondary | ICD-10-CM

## 2022-01-11 DIAGNOSIS — B351 Tinea unguium: Secondary | ICD-10-CM

## 2022-01-11 DIAGNOSIS — E1149 Type 2 diabetes mellitus with other diabetic neurological complication: Secondary | ICD-10-CM | POA: Diagnosis not present

## 2022-01-11 MED ORDER — CICLOPIROX 8 % EX SOLN: Freq: Every day | CUTANEOUS | 0 refills | Status: DC

## 2022-01-11 NOTE — Patient Instructions (Signed)

## 2022-01-13 ENCOUNTER — Telehealth: Payer: Self-pay | Admitting: *Deleted

## 2022-01-13 NOTE — Progress Notes (Signed)
Subjective: 61 year old female presents the office today for follow-up evaluation of bilateral foot discomfort.  She still taking Lyrica which helps somewhat.  She is still on Celebrex as well.  She is did not get the orthotics as she has not yet met her deductible.  Overall she states that she is about the same.  She does work 8 hours a day sitting and walking and describes a throbbing sensation describes pain mostly lateral aspect of the feet.  Her nails also thickened discolored as well.  No swelling redness or drainage of the toenail sites.  Her last A1c was 6 and her last glucose was 120 she reports.  Objective: AAO x3, NAD DP/PT pulses palpable bilaterally, CRT less than 3 seconds Nails are dystrophic with yellow, brown discoloration.  No edema, erythema or signs of infection.   She still describes tenderness on the arch of the foot on the plantar aspect and also she is not on the lateral aspect along the course the peroneal tendon.  Not able to identify any area of pinpoint tenderness today.  No edema, erythema.  No pain with calf compression, swelling, warmth, erythema  Assessment: 62 year old female with bilateral foot pain, Plantar fasciitis, tendinitis; neuropathy; onychomycosis  Plan: -All treatment options discussed with the patient including all alternatives, risks, complications.  -In regards to her overall foot.  Dispensed power steps today.  I discussed continued stretching, icing.  Continue Celebrex.  Discussed physical therapy as well. -ABI was ordered due to the BPA alert that came out.  Also be good to rule out any underlying vascular issues that could be causing foot pain.  After discussing symptoms she does get cramping to her legs. -Continue Lyrica for likely neuropathy.  This is had some minimal help. -Order Penlac for onychomycosis.  Wants to hold off on oral medication given side effects. -Daily foot inspection.  -Patient encouraged to call the office with any questions,  concerns, change in symptoms.   Bridget [name] DPM

## 2022-01-13 NOTE — Telephone Encounter (Signed)
No prior authorization for cpt [id number], contact person: Martie Lee L-01/13/22 Have contacted Para March w/ [facility] to contact patient to schedule.

## 2022-02-07 ENCOUNTER — Telehealth: Payer: Self-pay | Admitting: *Deleted

## 2022-02-07 NOTE — Telephone Encounter (Signed)
Bridget [name] w/ [facility] calling because she said that a prior is needed for the study(US ABI w w/o TBI). Contacted [facility]([phone])2nd time calling them and still no prior authorization is required, ref# [name],

## 2022-02-09 ENCOUNTER — Ambulatory Visit (HOSPITAL_COMMUNITY)
Admission: RE | Admit: 2022-02-09 | Discharge: 2022-02-09 | Disposition: A | Discharge status: Home / Self Care | Payer: BC Managed Care – PPO | Source: Ambulatory Visit | Attending: Podiatry | Admitting: Podiatry

## 2022-02-09 DIAGNOSIS — R252 Cramp and spasm: Secondary | ICD-10-CM | POA: Insufficient documentation

## 2022-04-14 ENCOUNTER — Ambulatory Visit: Payer: BC Managed Care – PPO | Admitting: Podiatry

## 2022-04-25 ENCOUNTER — Other Ambulatory Visit (HOSPITAL_COMMUNITY): Payer: Self-pay

## 2022-04-25 ENCOUNTER — Telehealth: Payer: Self-pay | Admitting: Pharmacy Technician

## 2022-04-25 NOTE — Telephone Encounter (Signed)
Patient Advocate Encounter   Received notification from CoverMyMeds that prior authorization for Trulicity 1.5mg  is required/requested.  She saw Dr. Everardo All, last in April. Who can I put for the physician to request this PA.

## 2022-06-08 ENCOUNTER — Other Ambulatory Visit (HOSPITAL_COMMUNITY): Payer: Self-pay

## 2022-07-20 ENCOUNTER — Other Ambulatory Visit: Payer: Self-pay | Admitting: Family Medicine

## 2022-07-20 ENCOUNTER — Ambulatory Visit
Admission: RE | Admit: 2022-07-20 | Discharge: 2022-07-20 | Disposition: A | Discharge status: Home / Self Care | Payer: BC Managed Care – PPO | Source: Ambulatory Visit | Attending: Family Medicine | Admitting: Family Medicine

## 2022-07-20 DIAGNOSIS — M5489 Other dorsalgia: Secondary | ICD-10-CM

## 2022-07-20 DIAGNOSIS — R0602 Shortness of breath: Secondary | ICD-10-CM

## 2022-08-03 ENCOUNTER — Encounter: Payer: Self-pay | Admitting: Family Medicine

## 2022-08-04 ENCOUNTER — Other Ambulatory Visit: Payer: Self-pay | Admitting: Family Medicine

## 2022-08-04 DIAGNOSIS — M5134 Other intervertebral disc degeneration, thoracic region: Secondary | ICD-10-CM

## 2022-09-09 ENCOUNTER — Ambulatory Visit
Admission: RE | Admit: 2022-09-09 | Discharge: 2022-09-09 | Disposition: A | Discharge status: Home / Self Care | Payer: BC Managed Care – PPO | Source: Ambulatory Visit | Attending: Family Medicine | Admitting: Family Medicine

## 2022-09-09 DIAGNOSIS — M5134 Other intervertebral disc degeneration, thoracic region: Secondary | ICD-10-CM

## 2022-09-09 MED ORDER — IOPAMIDOL (ISOVUE-300) INJECTION 61%
75.0000 mL | Freq: Once | INTRAVENOUS | Status: AC | PRN
  Administered 2022-09-09: 75 mL via INTRAVENOUS

## 2022-11-24 ENCOUNTER — Ambulatory Visit (INDEPENDENT_AMBULATORY_CARE_PROVIDER_SITE_OTHER): Payer: BC Managed Care – PPO

## 2022-11-24 ENCOUNTER — Encounter (HOSPITAL_COMMUNITY): Payer: Self-pay

## 2022-11-24 ENCOUNTER — Ambulatory Visit (HOSPITAL_COMMUNITY)
Admission: EM | Admit: 2022-11-24 | Discharge: 2022-11-24 | Disposition: A | Discharge status: Home / Self Care | Payer: BC Managed Care – PPO | Attending: Family Medicine | Admitting: Family Medicine

## 2022-11-24 DIAGNOSIS — W19XXXA Unspecified fall, initial encounter: Secondary | ICD-10-CM

## 2022-11-24 DIAGNOSIS — M533 Sacrococcygeal disorders, not elsewhere classified: Secondary | ICD-10-CM

## 2022-11-24 DIAGNOSIS — M25531 Pain in right wrist: Secondary | ICD-10-CM | POA: Diagnosis not present

## 2022-11-24 MED ORDER — TRAMADOL HCL 50 MG PO TABS: 50.0000 mg | ORAL_TABLET | Freq: Four times a day (QID) | ORAL | 0 refills | Status: AC | PRN

## 2022-11-24 NOTE — ED Triage Notes (Signed)
Pt reports a recent fall this morning after taking her dogs out. Pt stated she fell and landed on her buttocks. Pt stated her right wrist is swollen.

## 2022-11-24 NOTE — Discharge Instructions (Addendum)
Wear the wrist brace as needed over the next week. Remove at times to move your wrist some.

## 2022-12-07 ENCOUNTER — Encounter: Payer: Self-pay | Admitting: Gastroenterology

## 2022-12-07 NOTE — ED Provider Notes (Signed)
[facility]   [id number] 11/24/22 Arrival Time: 1515  ASSESSMENT & PLAN:  1. Right wrist pain   2. Coccyx pain     I have personally viewed and independently interpreted the imaging studies obtained this visit. R WRIST: no acute bony abnormalities appreciated. COCCYX: no acute bony abnormalities appreciated.  Activities as tolerated. Wrist brace for comfort. To remove at times to work on wrist ROM.  Discharge Medication List as of 11/24/2022  6:36 PM     START taking these medications   Details  traMADol (ULTRAM) 50 MG tablet Take 1 tablet (50 mg total) by mouth every 6 (six) hours as needed., Starting Thu 11/24/2022, Normal          Recommend:  Follow-up Information     Dominica SeverinGramig, William, MD.   Specialty: Orthopedic Surgery Why: If worsening or failing to improve as anticipated over the next week. Contact information: [address] [place] [address] [phone]                 [place] Controlled Substances Registry consulted for this patient. I feel the risk/benefit ratio today is favorable for proceeding with this prescription for a controlled substance. Medication sedation precautions given.  Reviewed expectations re: course of current medical issues. Questions answered. Outlined signs and symptoms indicating need for more acute intervention. Patient verbalized understanding. After Visit Summary given.  SUBJECTIVE: History from: patient. Bridget [name] is a 62 y.o. female who reports a fall this morning after taking her dogs out. Pt stated she fell and landed on her buttocks. Pain of R wrist and tailbone. Denies extremity sensation changes or weakness. Denies abd pain. Denies head injury. Ambulatory here. No tx PTA.  Past Surgical History:  Procedure Laterality Date   ABDOMINAL HYSTERECTOMY  1990   ABDOMINAL HYSTERECTOMY     BLADDER SURGERY  2001   CATARACT EXTRACTION Right 2001   with implant   EYE SURGERY     implant rt  eye   ROTATOR CUFF REPAIR Right    SHOULDER ARTHROSCOPY WITH SUBACROMIAL DECOMPRESSION Left 07/20/2015   Procedure: SHOULDER ARTHROSCOPY WITH DEBRIDEMENT OF ROTATOR CUFF AND SUBACROMIAL DECOMPRESSION ;  Surgeon: Jones BroomJustin Chandler, MD;  Location: [facility];  Service: Orthopedics;  Laterality: Left;  Left shoulder arthroscopy with debridement of rotator cuff and subacromial decompression      OBJECTIVE:  Vitals:   11/24/22 1651  BP: (!) 145/66  Pulse: 80  Resp: 16  Temp: 98.3 F (36.8 C)  SpO2: 100%    General appearance: alert; no distress HEENT: [place]; AT Neck: supple with FROM Resp: unlabored respirations Extremities: RUE: warm with well perfused appearance; poorly localized mild to moderate tenderness over right dorsal wrist; without gross deformities; swelling: mild; bruising: none; wrist ROM: normal, with discomfort CV: brisk extremity capillary refill of RUE; 2+ radial pulse of RUE. Back: does report TTP over coccyx distribution; no overlying skin changes Skin: warm and dry; no visible rashes Neurologic: gait normal; normal sensation and strength of all extremities Psychological: alert and cooperative; normal mood and affect  Imaging: DG Wrist Complete Right  Result Date: 11/24/2022 CLINICAL DATA:  pain s/p fall EXAM: RIGHT WRIST - COMPLETE 3+ VIEW COMPARISON:  None Available. FINDINGS: There is no evidence of fracture or dislocation. Vague cortical irregularity of the distal radius likely old healed fracture. Mild degenerative changes of the carpal bones and distal radius. No aggressive appearing focal abnormality. Soft tissues are unremarkable. IMPRESSION: 1. No definite acute displaced fracture  or dislocation. 2. Vague cortical irregularity of the distal radius likely old healed fracture. Correlate with point tenderness to palpation to evaluate for a chronic component. Electronically Signed   By: Tish Frederickson M.D.   On: 11/24/2022 18:23   DG  Sacrum/Coccyx  Result Date: 11/24/2022 CLINICAL DATA:  Pain post fall EXAM: SACRUM AND COCCYX - 2+ VIEW COMPARISON:  CT 09/06/2021 FINDINGS: There is no evidence of fracture or other focal bone lesions. IMPRESSION: Negative. Electronically Signed   By: Jasmine Pang M.D.   On: 11/24/2022 18:17      Allergies  Allergen Reactions   Sulfa Antibiotics Hives and Rash   Citrus Itching   Invokana [Canagliflozin] Other (See Comments)    Caused frequent vaginal yeast infections   Codeine Hives   Metformin And Related Diarrhea and Nausea And Vomiting   Metoclopramide Hcl Nausea And Vomiting   Penicillins Nausea And Vomiting   Propoxyphene Nausea And Vomiting    GI upset   Propoxyphene N-Acetaminophen Nausea And Vomiting   Sulfonamide Derivatives Hives    Past Medical History:  Diagnosis Date   Bacterial vaginosis 12/07/2010   Depression    Diabetes mellitus without complication (HCC)    Diverticulosis    DIVERTICULOSIS, COLON 10/26/2006   Fibromyalgia    GERD (gastroesophageal reflux disease)    Glaucoma    Headache    HEEL SPUR 09/10/2007   Qualifier: Diagnosis of  By: Lanier Prude  MD, Taineisha     Hypertension    MALAISE AND FATIGUE 05/27/2010   Obesity    OBESITY, NOS 10/26/2006   Osteoarthritis    OSTEOARTHRITIS, KNEE 04/27/2010   Retained tampon 05/21/2012   SHOULDER PAIN, LEFT 05/19/2010   Qualifier: Diagnosis of  By: Clotilde Dieter MD, Amber     Vaginal yeast infection 12/08/2010   Social History   Socioeconomic History   Marital status: Divorced    Spouse name: Not on file   Number of children: 2   Years of education: Not on file   Highest education level: Not on file  Occupational History   Occupation: PBX Designer, television/film set    Employer: [employer]  Tobacco Use   Smoking status: Former    Packs/day: 0.30    Years: 20.00    Additional pack years: 0.00    Total pack years: 6.00    Types: Cigarettes    Quit date: 08/29/1998    Years since quitting: 24.2   Smokeless tobacco: Never    Tobacco comments:    Quit in 2000   Vaping Use   Vaping Use: Never used  Substance and Sexual Activity   Alcohol use: Yes    Comment: socially   Drug use: No   Sexual activity: Yes    Birth control/protection: Surgical  Other Topics Concern   Not on file  Social History Narrative   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   Social Determinants of Corporate investment banker Strain: Not on file  Food Insecurity: Not on file  Transportation Needs: Not on file  Physical Activity: Not on file  Stress: Not on file  Social Connections: Not on file   Family History  Problem Relation Age of Onset   Breast cancer Mother    Diabetes Mother    Cancer Mother    Ovarian cancer Maternal Grandmother    Heart disease Maternal Grandmother        great   Stomach cancer Cousin    Diabetes Brother  x 3   Hypertension Brother    Alcohol abuse Father    Diabetes Brother    Diabetes Brother    Hypertension Brother    Hypertension Sister    Diabetes Sister    Past Surgical History:  Procedure Laterality Date   ABDOMINAL HYSTERECTOMY  1990   ABDOMINAL HYSTERECTOMY     BLADDER SURGERY  2001   CATARACT EXTRACTION Right 2001   with implant   EYE SURGERY     implant rt eye   ROTATOR CUFF REPAIR Right    SHOULDER ARTHROSCOPY WITH SUBACROMIAL DECOMPRESSION Left 07/20/2015   Procedure: SHOULDER ARTHROSCOPY WITH DEBRIDEMENT OF ROTATOR CUFF AND SUBACROMIAL DECOMPRESSION ;  Surgeon: Jones Broom, MD;  Location: [facility];  Service: Orthopedics;  Laterality: Left;  Left shoulder arthroscopy with debridement of rotator cuff and subacromial decompression       Mardella Layman, MD 12/07/22 1130

## 2022-12-30 ENCOUNTER — Encounter: Payer: Self-pay | Admitting: Gastroenterology

## 2023-02-02 ENCOUNTER — Other Ambulatory Visit: Payer: Self-pay | Admitting: Family Medicine

## 2023-02-02 ENCOUNTER — Ambulatory Visit
Admission: RE | Admit: 2023-02-02 | Discharge: 2023-02-02 | Disposition: A | Discharge status: Home / Self Care | Payer: BC Managed Care – PPO | Source: Ambulatory Visit | Attending: Family Medicine | Admitting: Family Medicine

## 2023-02-02 DIAGNOSIS — M5430 Sciatica, unspecified side: Secondary | ICD-10-CM

## 2023-03-10 ENCOUNTER — Encounter: Payer: Self-pay | Admitting: Gastroenterology

## 2023-04-14 ENCOUNTER — Ambulatory Visit: Payer: BC Managed Care – PPO

## 2023-04-14 VITALS — Ht 65.5 in | Wt 195.0 lb

## 2023-04-14 DIAGNOSIS — Z1211 Encounter for screening for malignant neoplasm of colon: Secondary | ICD-10-CM

## 2023-04-14 MED ORDER — NA SULFATE-K SULFATE-MG SULF 17.5-3.13-1.6 GM/177ML PO SOLN: 1.0000 | Freq: Once | ORAL | 0 refills | Status: AC

## 2023-04-14 NOTE — Progress Notes (Signed)
Pre visit completed via phone call; Patient verified name, DOB, and address;  No egg or soy allergy known to patient  No issues known to pt with past sedation with any surgeries or procedures Patient denies ever being told they had issues or difficulty with intubation  No FH of Malignant Hyperthermia Pt is not on diet pills Pt is not on home 02  Pt is not on blood thinners  Pt denies issues with constipation - takes Miralax daily (at times - up to twice weekly)-patient reports she does not have a bowel movement daily but is eating fiber rich foods, intake of water No A fib or A flutter  Have any cardiac testing pending--NO Insurance verified during PV appt--- BCBS [place]  Pt can ambulate without assistance;  Pt denies use of chewing tobacco Discussed diabetic/weight loss medication holds; Discussed NSAID holds; Checked BMI to be less than 50; Pt instructed to use Single[employer] or GoodRx for a price reduction on prep   Pre visit completed and red dot placed by patient's name on their procedure day (on provider's schedule).   Instructions sent to MyChart as well as printed and mailed to the patient per her request;

## 2023-05-09 ENCOUNTER — Ambulatory Visit (AMBULATORY_SURGERY_CENTER): Payer: BC Managed Care – PPO | Admitting: Gastroenterology

## 2023-05-09 ENCOUNTER — Encounter: Payer: Self-pay | Admitting: Gastroenterology

## 2023-05-09 VITALS — BP 128/61 | HR 70 | Temp 98.6°F | Resp 13 | Ht 65.0 in | Wt 195.0 lb

## 2023-05-09 DIAGNOSIS — K635 Polyp of colon: Secondary | ICD-10-CM | POA: Diagnosis not present

## 2023-05-09 DIAGNOSIS — Z1211 Encounter for screening for malignant neoplasm of colon: Secondary | ICD-10-CM | POA: Diagnosis present

## 2023-05-09 DIAGNOSIS — D127 Benign neoplasm of rectosigmoid junction: Secondary | ICD-10-CM | POA: Diagnosis not present

## 2023-05-09 MED ORDER — SODIUM CHLORIDE 0.9 % IV SOLN
500.0000 mL | INTRAVENOUS | Status: DC
Start: 2023-05-09 — End: 2023-05-09

## 2023-05-09 NOTE — Op Note (Signed)
[facility] Patient Name: Bridget [name] Procedure Date: 05/09/2023 8:44 AM MRN: [id number] Endoscopist: Corliss Parish , MD, [id number] Age: 62 Referring MD:  Date of Birth: 12/31/1960 Gender: Female Account #: [id number] Procedure:                Colonoscopy Indications:              Screening for colorectal malignant neoplasm Medicines:                Monitored Anesthesia Care Procedure:                Pre-Anesthesia Assessment:                           - Prior to the procedure, a History and Physical                            was performed, and patient medications and                            allergies were reviewed. The patient's tolerance of                            previous anesthesia was also reviewed. The risks                            and benefits of the procedure and the sedation                            options and risks were discussed with the patient.                            All questions were answered, and informed consent                            was obtained. Prior Anticoagulants: The patient has                            taken no anticoagulant or antiplatelet agents                            except for aspirin. ASA Grade Assessment: II - A                            patient with mild systemic disease. After reviewing                            the risks and benefits, the patient was deemed in                            satisfactory condition to undergo the procedure.                           After obtaining informed consent, the colonoscope  was passed under direct vision. Throughout the                            procedure, the patient's blood pressure, pulse, and                            oxygen saturations were monitored continuously. The                            Olympus CF-HQ190L ([id number]) Colonoscope was                            introduced through the anus and advanced to the the                             cecum, identified by appendiceal orifice and                            ileocecal valve. The colonoscopy was performed                            without difficulty. The patient tolerated the                            procedure. The quality of the bowel preparation was                            good. The ileocecal valve, appendiceal orifice, and                            rectum were photographed. Scope In: 9:03:27 AM Scope Out: 9:18:14 AM Scope Withdrawal Time: 0 hours 11 minutes 21 seconds  Total Procedure Duration: 0 hours 14 minutes 47 seconds  Findings:                 The perianal exam findings include hemorrhoids.                           Multiple medium-mouthed and small-mouthed                            diverticula were found in the recto-sigmoid colon,                            sigmoid colon and descending colon.                           A 4 mm polyp was found in the recto-sigmoid colon.                            The polyp was sessile. The polyp was removed with a                            cold snare. Resection and retrieval were complete.  Normal mucosa was found in the entire colon                            otherwise.                           Anal papilla(e) were hypertrophied.                           Non-bleeding non-thrombosed internal hemorrhoids                            were found during retroflexion, during perianal                            exam and during digital exam. The hemorrhoids were                            Grade II (internal hemorrhoids that prolapse but                            reduce spontaneously). Complications:            No immediate complications. Estimated Blood Loss:     Estimated blood loss was minimal. Impression:               - Hemorrhoids found on perianal exam.                           - Diverticulosis in the recto-sigmoid colon, in the                            sigmoid colon and in the  descending colon.                           - One 4 mm polyp at the recto-sigmoid colon,                            removed with a cold snare. Resected and retrieved.                           - Normal mucosa in the entire examined colon                            otherwise.                           - Anal papilla(e) were hypertrophied.                           - Non-bleeding non-thrombosed internal hemorrhoids. Recommendation:           - The patient will be observed post-procedure,                            until all discharge criteria are met.                           -  Discharge patient to home.                           - Patient has a contact number available for                            emergencies. The signs and symptoms of potential                            delayed complications were discussed with the                            patient. Return to normal activities tomorrow.                            Written discharge instructions were provided to the                            patient.                           - High fiber diet.                           - Use FiberCon 1-2 tablets PO daily.                           - Continue present medications.                           - Await pathology results.                           - Repeat colonoscopy in 5-10 years for surveillance                            based on pathology results and findings of                            adenomatous tissue.                           - The findings and recommendations were discussed                            with the patient.                           - The findings and recommendations were discussed                            with the patient's family. Corliss Parish, MD 05/09/2023 9:23:21 AM

## 2023-05-09 NOTE — Progress Notes (Signed)
Called to room to assist during endoscopic procedure.  Patient ID and intended procedure confirmed with present staff. Received instructions for my participation in the procedure from the performing physician.  

## 2023-05-09 NOTE — Progress Notes (Signed)
GASTROENTEROLOGY PROCEDURE H&P NOTE   Primary Care Physician: Renaye Rakers, MD  HPI: Bridget [name] is a 62 y.o. female who presents for Colonoscopy for screening.  Past Medical History:  Diagnosis Date   Bacterial vaginosis 12/07/2010   Depression    on meds   Diabetes mellitus without complication (HCC)    Diverticulosis    DIVERTICULOSIS, COLON 10/26/2006   Fibromyalgia    GERD (gastroesophageal reflux disease)    on meds   Glaucoma    on meds   Headache    HEEL SPUR 09/10/2007   Qualifier: Diagnosis of  By: Lanier Prude  MD, Taineisha     Hypertension    NOT on meds   MALAISE AND FATIGUE 05/27/2010   Obesity    OBESITY, NOS 10/26/2006   Osteoarthritis    OSTEOARTHRITIS, KNEE 04/27/2010   on meds   Osteopenia    Retained tampon 05/21/2012   SHOULDER PAIN, LEFT 05/19/2010   Qualifier: Diagnosis of  By: Clotilde Dieter MD, Amber     Vaginal yeast infection 12/08/2010   Past Surgical History:  Procedure Laterality Date   ABDOMINAL HYSTERECTOMY  08/29/1988   BLADDER SURGERY  08/30/1999   CATARACT EXTRACTION Right 08/30/1999   with implant   COLONOSCOPY  2014   DJ-MAC-moviprep(good)-tics/normal - 10 yr recall   EYE SURGERY     implant rt eye   ROTATOR CUFF REPAIR Right    SHOULDER ARTHROSCOPY WITH SUBACROMIAL DECOMPRESSION Left 07/20/2015   Procedure: SHOULDER ARTHROSCOPY WITH DEBRIDEMENT OF ROTATOR CUFF AND SUBACROMIAL DECOMPRESSION ;  Surgeon: Jones Broom, MD;  Location: [facility];  Service: Orthopedics;  Laterality: Left;  Left shoulder arthroscopy with debridement of rotator cuff and subacromial decompression   Current Outpatient Medications  Medication Sig Dispense Refill   albuterol (VENTOLIN HFA) 108 (90 Base) MCG/ACT inhaler Inhale 2 puffs into the lungs every 4 (four) hours as needed.     aspirin 81 MG tablet Take 1 tablet (81 mg total) by mouth daily.     BD PEN NEEDLE NANO 2ND GEN 32G X 4 MM MISC USE AS DIRECTED 4 TIMES A DAY 100 each 0    benzonatate (TESSALON) 200 MG capsule Take 200 mg by mouth 3 (three) times daily as needed for cough.     brimonidine-timolol (COMBIGAN) 0.2-0.5 % ophthalmic solution INSTILL 1 DROP INTO BOTH EYES TWICE A DAY AS DIRECTED     celecoxib (CELEBREX) 200 MG capsule Take 200 mg by mouth 2 (two) times daily.     cetirizine (ZYRTEC) 10 MG tablet Take 1 tablet (10 mg total) by mouth daily. (Patient taking differently: Take 10 mg by mouth daily as needed.) 30 tablet 11   Continuous Glucose Receiver (FREESTYLE LIBRE 2 READER) DEVI by Does not apply route.     Continuous Glucose Sensor (FREESTYLE LIBRE 2 SENSOR) MISC by Does not apply route.     Continuous Glucose Sensor (FREESTYLE LIBRE 3 SENSOR) MISC CHANGE SENSOR EVERY 14 DAYS     Continuous Glucose Sensor (FREESTYLE LIBRE 3 SENSOR) MISC CHANGE SENSOR EVERY 14 DAYS     cyclobenzaprine (FLEXERIL) 10 MG tablet Take 10 mg by mouth 3 (three) times daily as needed.     docusate sodium (COLACE) 100 MG capsule Take 1 capsule (100 mg total) by mouth 3 (three) times daily as needed. 20 capsule 0   dorzolamide (TRUSOPT) 2 % ophthalmic solution Place 1 drop into both eyes 2 (two) times daily.     fluconazole (DIFLUCAN) 150 MG tablet  Take 150 mg by mouth as needed.     fluticasone (FLONASE) 50 MCG/ACT nasal spray Place 2 sprays into both nostrils daily. 16 g 0   FREESTYLE LITE test strip USE TO TEST TWICE A DAY 100 strip 0   Lancets (FREESTYLE) lancets USE TO CHECK BLOOD SUGAR TWICE DAILY. 100 each 0   levocetirizine (XYZAL) 5 MG tablet Take 5 mg by mouth every evening.     levofloxacin (LEVAQUIN) 500 MG tablet Take 1 tablet by mouth daily.     Multiple Vitamin (MULTIVITAMIN PO) Take 1 tablet by mouth daily at 6 (six) AM. Gummies     nystatin (MYCOSTATIN/NYSTOP) powder Apply 1 Application topically 3 (three) times daily.     nystatin cream (MYCOSTATIN) Apply 1 Application topically 3 (three) times daily.     OZEMPIC, 2 MG/DOSE, 8 MG/3ML SOPN Inject 1 Dose into the  skin once a week.     pantoprazole (PROTONIX) 20 MG tablet Take 1 tablet by mouth daily.     polyethylene glycol (MIRALAX / GLYCOLAX) 17 g packet Take 17 g by mouth 2 (two) times a week.     pregabalin (LYRICA) 150 MG capsule TAKE 1 CAPSULE BY MOUTH 2 TIMES DAILY. 60 capsule 0   ROCKLATAN 0.02-0.005 % SOLN SMARTSIG:1 Drop(s) In Eye(s) Every Evening     traMADol (ULTRAM) 50 MG tablet Take 1 tablet (50 mg total) by mouth every 6 (six) hours as needed. 15 tablet 0   triamcinolone (NASACORT AQ) 55 MCG/ACT AERO nasal inhaler Place 2 sprays into the nose daily. 1 Inhaler 12   No current facility-administered medications for this visit.    Current Outpatient Medications:    albuterol (VENTOLIN HFA) 108 (90 Base) MCG/ACT inhaler, Inhale 2 puffs into the lungs every 4 (four) hours as needed., Disp: , Rfl:    aspirin 81 MG tablet, Take 1 tablet (81 mg total) by mouth daily., Disp: , Rfl:    BD PEN NEEDLE NANO 2ND GEN 32G X 4 MM MISC, USE AS DIRECTED 4 TIMES A DAY, Disp: 100 each, Rfl: 0   benzonatate (TESSALON) 200 MG capsule, Take 200 mg by mouth 3 (three) times daily as needed for cough., Disp: , Rfl:    brimonidine-timolol (COMBIGAN) 0.2-0.5 % ophthalmic solution, INSTILL 1 DROP INTO BOTH EYES TWICE A DAY AS DIRECTED, Disp: , Rfl:    celecoxib (CELEBREX) 200 MG capsule, Take 200 mg by mouth 2 (two) times daily., Disp: , Rfl:    cetirizine (ZYRTEC) 10 MG tablet, Take 1 tablet (10 mg total) by mouth daily. (Patient taking differently: Take 10 mg by mouth daily as needed.), Disp: 30 tablet, Rfl: 11   Continuous Glucose Receiver (FREESTYLE LIBRE 2 READER) DEVI, by Does not apply route., Disp: , Rfl:    Continuous Glucose Sensor (FREESTYLE LIBRE 2 SENSOR) MISC, by Does not apply route., Disp: , Rfl:    Continuous Glucose Sensor (FREESTYLE LIBRE 3 SENSOR) MISC, CHANGE SENSOR EVERY 14 DAYS, Disp: , Rfl:    Continuous Glucose Sensor (FREESTYLE LIBRE 3 SENSOR) MISC, CHANGE SENSOR EVERY 14 DAYS, Disp: , Rfl:     cyclobenzaprine (FLEXERIL) 10 MG tablet, Take 10 mg by mouth 3 (three) times daily as needed., Disp: , Rfl:    docusate sodium (COLACE) 100 MG capsule, Take 1 capsule (100 mg total) by mouth 3 (three) times daily as needed., Disp: 20 capsule, Rfl: 0   dorzolamide (TRUSOPT) 2 % ophthalmic solution, Place 1 drop into both eyes 2 (two) times daily., Disp: ,  Rfl:    fluconazole (DIFLUCAN) 150 MG tablet, Take 150 mg by mouth as needed., Disp: , Rfl:    fluticasone (FLONASE) 50 MCG/ACT nasal spray, Place 2 sprays into both nostrils daily., Disp: 16 g, Rfl: 0   FREESTYLE LITE test strip, USE TO TEST TWICE A DAY, Disp: 100 strip, Rfl: 0   Lancets (FREESTYLE) lancets, USE TO CHECK BLOOD SUGAR TWICE DAILY., Disp: 100 each, Rfl: 0   levocetirizine (XYZAL) 5 MG tablet, Take 5 mg by mouth every evening., Disp: , Rfl:    levofloxacin (LEVAQUIN) 500 MG tablet, Take 1 tablet by mouth daily., Disp: , Rfl:    Multiple Vitamin (MULTIVITAMIN PO), Take 1 tablet by mouth daily at 6 (six) AM. Gummies, Disp: , Rfl:    nystatin (MYCOSTATIN/NYSTOP) powder, Apply 1 Application topically 3 (three) times daily., Disp: , Rfl:    nystatin cream (MYCOSTATIN), Apply 1 Application topically 3 (three) times daily., Disp: , Rfl:    OZEMPIC, 2 MG/DOSE, 8 MG/3ML SOPN, Inject 1 Dose into the skin once a week., Disp: , Rfl:    pantoprazole (PROTONIX) 20 MG tablet, Take 1 tablet by mouth daily., Disp: , Rfl:    polyethylene glycol (MIRALAX / GLYCOLAX) 17 g packet, Take 17 g by mouth 2 (two) times a week., Disp: , Rfl:    pregabalin (LYRICA) 150 MG capsule, TAKE 1 CAPSULE BY MOUTH 2 TIMES DAILY., Disp: 60 capsule, Rfl: 0   ROCKLATAN 0.02-0.005 % SOLN, SMARTSIG:1 Drop(s) In Eye(s) Every Evening, Disp: , Rfl:    traMADol (ULTRAM) 50 MG tablet, Take 1 tablet (50 mg total) by mouth every 6 (six) hours as needed., Disp: 15 tablet, Rfl: 0   triamcinolone (NASACORT AQ) 55 MCG/ACT AERO nasal inhaler, Place 2 sprays into the nose daily., Disp: 1  Inhaler, Rfl: 12 Allergies  Allergen Reactions   Sulfa Antibiotics Hives and Rash   Citrus Itching   Invokana [Canagliflozin] Other (See Comments)    Caused frequent vaginal yeast infections   Codeine Hives   Metformin And Related Diarrhea and Nausea And Vomiting   Metoclopramide Hcl Nausea And Vomiting   Penicillins Nausea And Vomiting   Propoxyphene Nausea And Vomiting    GI upset   Propoxyphene N-Acetaminophen Nausea And Vomiting   Sulfonamide Derivatives Hives   Family History  Problem Relation Age of Onset   Breast cancer Mother    Diabetes Mother    Alcohol abuse Father    Hypertension Sister    Diabetes Sister    Diabetes Brother        x 3   Hypertension Brother    Diabetes Brother    Diabetes Brother    Hypertension Brother    Ovarian cancer Maternal Grandmother    Heart disease Maternal Grandmother        great   Stomach cancer Cousin    Social History   Socioeconomic History   Marital status: Divorced    Spouse name: Not on file   Number of children: 2   Years of education: Not on file   Highest education level: Not on file  Occupational History   Occupation: PBX Designer, television/film set    Employer: [employer]  Tobacco Use   Smoking status: Former    Current packs/day: 0.00    Average packs/day: 0.3 packs/day for 20.0 years (6.0 ttl pk-yrs)    Types: Cigarettes    Start date: 08/29/1978    Quit date: 08/29/1998    Years since quitting: 24.7   Smokeless tobacco:  Never   Tobacco comments:    Quit in 2000   Vaping Use   Vaping status: Never Used  Substance and Sexual Activity   Alcohol use: Yes    Comment: socially   Drug use: No   Sexual activity: Yes    Birth control/protection: Surgical  Other Topics Concern   Not on file  Social History Narrative   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   Social Determinants of Corporate investment banker Strain: Not on file  Food Insecurity: Not on file  Transportation Needs: Not on file  Physical Activity: Not on  file  Stress: Not on file  Social Connections: Unknown (01/11/2022)   Received [facility]   Social Network    Social Network: Not on file  Intimate Partner Violence: Unknown (12/03/2021)   Received from [facility]   HITS    Physically Hurt: Not on file    Insult or Talk Down To: Not on file    Threaten Physical Harm: Not on file    Scream or Curse: Not on file    Physical Exam: There were no vitals filed for this visit. There is no height or weight on file to calculate BMI. GEN: NAD EYE: Sclerae anicteric ENT: MMM CV: Non-tachycardic GI: Soft, NT/ND NEURO:  Alert & Oriented x 3  Lab Results: No results for input(s): "WBC", "HGB", "HCT", "PLT" in the last 72 hours. BMET No results for input(s): "NA", "K", "CL", "CO2", "GLUCOSE", "BUN", "CREATININE", "CALCIUM" in the last 72 hours. LFT No results for input(s): "PROT", "ALBUMIN", "AST", "ALT", "ALKPHOS", "BILITOT", "BILIDIR", "IBILI" in the last 72 hours. PT/INR No results for input(s): "LABPROT", "INR" in the last 72 hours.   Impression / Plan: This is a 62 y.o.female who presents for Colonoscopy for screening.  The risks and benefits of endoscopic evaluation/treatment were discussed with the patient and/or family; these include but are not limited to the risk of perforation, infection, bleeding, missed lesions, lack of diagnosis, severe illness requiring hospitalization, as well as anesthesia and sedation related illnesses.  The patient's history has been reviewed, patient examined, no change in status, and deemed stable for procedure.  The patient and/or family is agreeable to proceed.    Corliss Parish, MD [facility] Office # [phone]

## 2023-05-09 NOTE — Progress Notes (Signed)
Handouts on polyps,diverticulosis,& hemorrhoids given to you today.   Await pathology results on polyps removed

## 2023-05-09 NOTE — Progress Notes (Signed)
Sedate, gd SR, tolerated procedure well, VSS, report to RN 

## 2023-05-09 NOTE — Patient Instructions (Addendum)
Await pathology results.  High fiber diet. Use FiberCon 1-2 tablets by mouth daily.  Handouts on polyps, diverticulosis, and hemorrhoids provided.  YOU HAD AN ENDOSCOPIC PROCEDURE TODAY AT THE  ENDOSCOPY CENTER:   Refer to the procedure report that was given to you for any specific questions about what was found during the examination.  If the procedure report does not answer your questions, please call your gastroenterologist to clarify.  If you requested that your care partner not be given the details of your procedure findings, then the procedure report has been included in a sealed envelope for you to review at your convenience later.  YOU SHOULD EXPECT: Some feelings of bloating in the abdomen. Passage of more gas than usual.  Walking can help get rid of the air that was put into your GI tract during the procedure and reduce the bloating. If you had a lower endoscopy (such as a colonoscopy or flexible sigmoidoscopy) you may notice spotting of blood in your stool or on the toilet paper. If you underwent a bowel prep for your procedure, you may not have a normal bowel movement for a few days.  Please Note:  You might notice some irritation and congestion in your nose or some drainage.  This is from the oxygen used during your procedure.  There is no need for concern and it should clear up in a day or so.  SYMPTOMS TO REPORT IMMEDIATELY:  Following lower endoscopy (colonoscopy or flexible sigmoidoscopy):  Excessive amounts of blood in the stool  Significant tenderness or worsening of abdominal pains  Swelling of the abdomen that is new, acute  Fever of 100F or higher  For urgent or emergent issues, a gastroenterologist can be reached at any hour by calling [phone]. Do not use MyChart messaging for urgent concerns.    DIET:  We do recommend a small meal at first, but then you may proceed to your regular diet.  Drink plenty of fluids but you should avoid alcoholic beverages  for 24 hours.  ACTIVITY:  You should plan to take it easy for the rest of today and you should NOT DRIVE or use heavy machinery until tomorrow (because of the sedation medicines used during the test).    FOLLOW UP: Our staff will call the number listed on your records the next business day following your procedure.  We will call around 7:15- 8:00 am to check on you and address any questions or concerns that you may have regarding the information given to you following your procedure. If we do not reach you, we will leave a message.     If any biopsies were taken you will be contacted by phone or by letter within the next 1-3 weeks.  Please call us at [phone] if you have not heard about the biopsies in 3 weeks.    SIGNATURES/CONFIDENTIALITY: You and/or your care partner have signed paperwork which will be entered into your electronic medical record.  These signatures attest to the fact that that the information above on your After Visit Summary has been reviewed and is understood.  Full responsibility of the confidentiality of this discharge information lies with you and/or your care-partner.

## 2023-05-09 NOTE — Progress Notes (Signed)
Pt's states no medical or surgical changes since previsit or office visit. 

## 2023-05-10 ENCOUNTER — Telehealth: Payer: Self-pay

## 2023-05-10 NOTE — Telephone Encounter (Signed)
  Follow up Call-     05/09/2023    8:17 AM  Call back number  Post procedure Call Back phone  # [phone]  Permission to leave phone message Yes     Patient questions:  Do you have a fever, pain , or abdominal swelling? No. Pain Score  0 *  Have you tolerated food without any problems? Yes.    Have you been able to return to your normal activities? Yes.    Do you have any questions about your discharge instructions: Diet   No. Medications  No. Follow up visit  No.  Do you have questions or concerns about your Care? No.  Actions: * If pain score is 4 or above: No action needed, pain <4.

## 2023-05-11 LAB — SURGICAL PATHOLOGY

## 2023-05-15 ENCOUNTER — Encounter: Payer: Self-pay | Admitting: Gastroenterology

## 2023-11-15 ENCOUNTER — Other Ambulatory Visit: Payer: Self-pay

## 2023-11-16 LAB — SURGICAL PATHOLOGY
